# Patient Record
Sex: Female | Born: 1935
Health system: Southern US, Community
[De-identification: ages and names within clinical notes are randomized; demographics above are authoritative.]

## PROBLEM LIST (undated history)

## (undated) DIAGNOSIS — I10 Essential (primary) hypertension: Secondary | ICD-10-CM

## (undated) DIAGNOSIS — I219 Acute myocardial infarction, unspecified: Secondary | ICD-10-CM

## (undated) DIAGNOSIS — R0600 Dyspnea, unspecified: Secondary | ICD-10-CM

## (undated) DIAGNOSIS — C50919 Malignant neoplasm of unspecified site of unspecified female breast: Secondary | ICD-10-CM

## (undated) DIAGNOSIS — M1990 Unspecified osteoarthritis, unspecified site: Secondary | ICD-10-CM

## (undated) DIAGNOSIS — R9431 Abnormal electrocardiogram [ECG] [EKG]: Secondary | ICD-10-CM

## (undated) DIAGNOSIS — H9190 Unspecified hearing loss, unspecified ear: Secondary | ICD-10-CM

## (undated) DIAGNOSIS — R202 Paresthesia of skin: Secondary | ICD-10-CM

## (undated) DIAGNOSIS — F0390 Unspecified dementia without behavioral disturbance: Secondary | ICD-10-CM

## (undated) DIAGNOSIS — I872 Venous insufficiency (chronic) (peripheral): Secondary | ICD-10-CM

## (undated) DIAGNOSIS — F419 Anxiety disorder, unspecified: Secondary | ICD-10-CM

## (undated) DIAGNOSIS — S02609A Fracture of mandible, unspecified, initial encounter for closed fracture: Secondary | ICD-10-CM

## (undated) DIAGNOSIS — K449 Diaphragmatic hernia without obstruction or gangrene: Secondary | ICD-10-CM

## (undated) DIAGNOSIS — E785 Hyperlipidemia, unspecified: Secondary | ICD-10-CM

## (undated) DIAGNOSIS — R0609 Other forms of dyspnea: Secondary | ICD-10-CM

## (undated) DIAGNOSIS — K861 Other chronic pancreatitis: Secondary | ICD-10-CM

## (undated) DIAGNOSIS — C44300 Unspecified malignant neoplasm of skin of unspecified part of face: Secondary | ICD-10-CM

## (undated) DIAGNOSIS — M159 Polyosteoarthritis, unspecified: Secondary | ICD-10-CM

## (undated) DIAGNOSIS — E119 Type 2 diabetes mellitus without complications: Secondary | ICD-10-CM

## (undated) DIAGNOSIS — K219 Gastro-esophageal reflux disease without esophagitis: Secondary | ICD-10-CM

## (undated) DIAGNOSIS — Z853 Personal history of malignant neoplasm of breast: Secondary | ICD-10-CM

## (undated) DIAGNOSIS — J45909 Unspecified asthma, uncomplicated: Secondary | ICD-10-CM

## (undated) DIAGNOSIS — K589 Irritable bowel syndrome without diarrhea: Secondary | ICD-10-CM

## (undated) DIAGNOSIS — R42 Dizziness and giddiness: Secondary | ICD-10-CM

## (undated) DIAGNOSIS — N183 Chronic kidney disease, stage 3 unspecified: Secondary | ICD-10-CM

## (undated) DIAGNOSIS — K439 Ventral hernia without obstruction or gangrene: Secondary | ICD-10-CM

## (undated) DIAGNOSIS — F32A Depression, unspecified: Secondary | ICD-10-CM

## (undated) DIAGNOSIS — Z8719 Personal history of other diseases of the digestive system: Secondary | ICD-10-CM

## (undated) DIAGNOSIS — F329 Major depressive disorder, single episode, unspecified: Secondary | ICD-10-CM

## (undated) HISTORY — DX: Fracture of mandible, unspecified, initial encounter for closed fracture: S02.609A

## (undated) HISTORY — DX: Personal history of malignant neoplasm of breast: Z85.3

## (undated) HISTORY — PX: MASTECTOMY: SHX3

## (undated) HISTORY — PX: CHOLECYSTECTOMY OPEN: SUR202

## (undated) HISTORY — DX: Anxiety disorder, unspecified: F41.9

## (undated) HISTORY — DX: Dyspnea, unspecified: R06.00

## (undated) HISTORY — DX: Depression, unspecified: F32.A

## (undated) HISTORY — DX: Type 2 diabetes mellitus without complications: E11.9

## (undated) HISTORY — DX: Chronic kidney disease, stage 3 unspecified: N18.30

## (undated) HISTORY — PX: CATARACT EXTRACTION W/ INTRAOCULAR LENS  IMPLANT, BILATERAL: SHX1307

## (undated) HISTORY — DX: Unspecified dementia, unspecified severity, without behavioral disturbance, psychotic disturbance, mood disturbance, and anxiety: F03.90

## (undated) HISTORY — PX: BREAST SURGERY: SHX581

## (undated) HISTORY — DX: Venous insufficiency (chronic) (peripheral): I87.2

## (undated) HISTORY — DX: Other forms of dyspnea: R06.09

## (undated) HISTORY — DX: Diaphragmatic hernia without obstruction or gangrene: K44.9

## (undated) HISTORY — DX: Paresthesia of skin: R20.2

## (undated) HISTORY — DX: Unspecified hearing loss, unspecified ear: H91.90

## (undated) HISTORY — DX: Ventral hernia without obstruction or gangrene: K43.9

## (undated) HISTORY — DX: Abnormal electrocardiogram (ECG) (EKG): R94.31

## (undated) HISTORY — DX: Polyosteoarthritis, unspecified: M15.9

---
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
## 1999-06-10 ENCOUNTER — Encounter: Payer: Self-pay | Admitting: Emergency Medicine

## 1999-06-10 ENCOUNTER — Inpatient Hospital Stay (HOSPITAL_COMMUNITY): Admission: EM | Admit: 1999-06-10 | Discharge: 1999-06-13 | Payer: Self-pay | Admitting: Cardiology

## 2004-06-27 ENCOUNTER — Emergency Department (HOSPITAL_COMMUNITY): Admission: EM | Admit: 2004-06-27 | Discharge: 2004-06-27 | Payer: Self-pay | Admitting: Emergency Medicine

## 2014-11-24 HISTORY — PX: OTHER SURGICAL HISTORY: SHX169

## 2015-11-12 ENCOUNTER — Emergency Department (HOSPITAL_COMMUNITY): Payer: 59

## 2015-11-12 ENCOUNTER — Encounter (HOSPITAL_COMMUNITY): Payer: Self-pay | Admitting: *Deleted

## 2015-11-12 ENCOUNTER — Inpatient Hospital Stay (HOSPITAL_COMMUNITY)
Admission: EM | Admit: 2015-11-12 | Discharge: 2015-11-14 | DRG: 312 | Disposition: A | Discharge status: Nursing Home (Includes ICF) | Payer: 59 | Attending: Oncology | Admitting: Oncology

## 2015-11-12 DIAGNOSIS — E785 Hyperlipidemia, unspecified: Secondary | ICD-10-CM | POA: Diagnosis present

## 2015-11-12 DIAGNOSIS — I152 Hypertension secondary to endocrine disorders: Secondary | ICD-10-CM

## 2015-11-12 DIAGNOSIS — Z853 Personal history of malignant neoplasm of breast: Secondary | ICD-10-CM | POA: Diagnosis not present

## 2015-11-12 DIAGNOSIS — K219 Gastro-esophageal reflux disease without esophagitis: Secondary | ICD-10-CM | POA: Diagnosis present

## 2015-11-12 DIAGNOSIS — Z7982 Long term (current) use of aspirin: Secondary | ICD-10-CM | POA: Diagnosis not present

## 2015-11-12 DIAGNOSIS — K589 Irritable bowel syndrome without diarrhea: Secondary | ICD-10-CM | POA: Diagnosis present

## 2015-11-12 DIAGNOSIS — S02611A Fracture of condylar process of right mandible, initial encounter for closed fracture: Secondary | ICD-10-CM | POA: Diagnosis present

## 2015-11-12 DIAGNOSIS — D3501 Benign neoplasm of right adrenal gland: Secondary | ICD-10-CM | POA: Diagnosis present

## 2015-11-12 DIAGNOSIS — R55 Syncope and collapse: Principal | ICD-10-CM | POA: Diagnosis present

## 2015-11-12 DIAGNOSIS — Z79899 Other long term (current) drug therapy: Secondary | ICD-10-CM

## 2015-11-12 DIAGNOSIS — I10 Essential (primary) hypertension: Secondary | ICD-10-CM | POA: Diagnosis present

## 2015-11-12 DIAGNOSIS — W1830XA Fall on same level, unspecified, initial encounter: Secondary | ICD-10-CM | POA: Diagnosis present

## 2015-11-12 DIAGNOSIS — S01511A Laceration without foreign body of lip, initial encounter: Secondary | ICD-10-CM | POA: Diagnosis present

## 2015-11-12 DIAGNOSIS — K861 Other chronic pancreatitis: Secondary | ICD-10-CM | POA: Diagnosis present

## 2015-11-12 DIAGNOSIS — I252 Old myocardial infarction: Secondary | ICD-10-CM

## 2015-11-12 DIAGNOSIS — E43 Unspecified severe protein-calorie malnutrition: Secondary | ICD-10-CM | POA: Diagnosis present

## 2015-11-12 DIAGNOSIS — S02609A Fracture of mandible, unspecified, initial encounter for closed fracture: Secondary | ICD-10-CM

## 2015-11-12 DIAGNOSIS — D649 Anemia, unspecified: Secondary | ICD-10-CM | POA: Diagnosis present

## 2015-11-12 DIAGNOSIS — R011 Cardiac murmur, unspecified: Secondary | ICD-10-CM | POA: Diagnosis not present

## 2015-11-12 DIAGNOSIS — I452 Bifascicular block: Secondary | ICD-10-CM | POA: Diagnosis present

## 2015-11-12 DIAGNOSIS — I2510 Atherosclerotic heart disease of native coronary artery without angina pectoris: Secondary | ICD-10-CM | POA: Diagnosis present

## 2015-11-12 DIAGNOSIS — E1159 Type 2 diabetes mellitus with other circulatory complications: Secondary | ICD-10-CM

## 2015-11-12 DIAGNOSIS — E119 Type 2 diabetes mellitus without complications: Secondary | ICD-10-CM | POA: Diagnosis present

## 2015-11-12 DIAGNOSIS — J309 Allergic rhinitis, unspecified: Secondary | ICD-10-CM

## 2015-11-12 DIAGNOSIS — R42 Dizziness and giddiness: Secondary | ICD-10-CM

## 2015-11-12 HISTORY — DX: Anxiety disorder, unspecified: F41.9

## 2015-11-12 HISTORY — DX: Hyperlipidemia, unspecified: E78.5

## 2015-11-12 HISTORY — DX: Dizziness and giddiness: R42

## 2015-11-12 HISTORY — DX: Major depressive disorder, single episode, unspecified: F32.9

## 2015-11-12 HISTORY — DX: Unspecified osteoarthritis, unspecified site: M19.90

## 2015-11-12 HISTORY — DX: Unspecified malignant neoplasm of skin of unspecified part of face: C44.300

## 2015-11-12 HISTORY — DX: Gastro-esophageal reflux disease without esophagitis: K21.9

## 2015-11-12 HISTORY — DX: Acute myocardial infarction, unspecified: I21.9

## 2015-11-12 HISTORY — DX: Malignant neoplasm of unspecified site of unspecified female breast: C50.919

## 2015-11-12 HISTORY — DX: Essential (primary) hypertension: I10

## 2015-11-12 HISTORY — DX: Personal history of other diseases of the digestive system: Z87.19

## 2015-11-12 HISTORY — DX: Other chronic pancreatitis: K86.1

## 2015-11-12 HISTORY — DX: Irritable bowel syndrome, unspecified: K58.9

## 2015-11-12 LAB — URINALYSIS, ROUTINE W REFLEX MICROSCOPIC
Bilirubin Urine: NEGATIVE
GLUCOSE, UA: 100 mg/dL — AB
Hgb urine dipstick: NEGATIVE
Ketones, ur: 15 mg/dL — AB
LEUKOCYTES UA: NEGATIVE
NITRITE: NEGATIVE
PH: 5.5 (ref 5.0–8.0)
Protein, ur: NEGATIVE mg/dL
SPECIFIC GRAVITY, URINE: 1.036 — AB (ref 1.005–1.030)

## 2015-11-12 LAB — TROPONIN I: Troponin I: <0.03 ng/mL (ref <0.031)

## 2015-11-12 LAB — I-STAT TROPONIN, ED: Troponin i, poc: 0 ng/mL (ref 0.00–0.08)

## 2015-11-12 LAB — CBC
HEMATOCRIT: 32 % — AB (ref 36.0–46.0)
HEMOGLOBIN: 10.7 g/dL — AB (ref 12.0–15.0)
MCH: 31.2 pg (ref 26.0–34.0)
MCHC: 33.4 g/dL (ref 30.0–36.0)
MCV: 93.3 fL (ref 78.0–100.0)
Platelets: 209 10*3/uL (ref 150–400)
RBC: 3.43 MIL/uL — ABNORMAL LOW (ref 3.87–5.11)
RDW: 12.5 % (ref 11.5–15.5)
WBC: 8.1 10*3/uL (ref 4.0–10.5)

## 2015-11-12 LAB — BASIC METABOLIC PANEL
ANION GAP: 11 (ref 5–15)
BUN: 22 mg/dL — AB (ref 6–20)
CO2: 23 mmol/L (ref 22–32)
Calcium: 9.1 mg/dL (ref 8.9–10.3)
Chloride: 104 mmol/L (ref 101–111)
Creatinine, Ser: 1.13 mg/dL — ABNORMAL HIGH (ref 0.44–1.00)
GFR calc Af Amer: 50 mL/min — ABNORMAL LOW (ref >60)
GFR, EST NON AFRICAN AMERICAN: 43 mL/min — AB (ref >60)
GLUCOSE: 195 mg/dL — AB (ref 65–99)
POTASSIUM: 4.9 mmol/L (ref 3.5–5.1)
SODIUM: 138 mmol/L (ref 135–145)

## 2015-11-12 LAB — D-DIMER, QUANTITATIVE (NOT AT ARMC): D DIMER QUANT: 1.55 ug{FEU}/mL — AB (ref 0.00–0.50)

## 2015-11-12 LAB — GLUCOSE, CAPILLARY: Glucose-Capillary: 116 mg/dL — ABNORMAL HIGH (ref 65–99)

## 2015-11-12 LAB — TSH: TSH: 1.097 u[IU]/mL (ref 0.350–4.500)

## 2015-11-12 MED ORDER — METOPROLOL TARTRATE 25 MG PO TABS
25.0000 mg | ORAL_TABLET | Freq: Two times a day (BID) | ORAL | Status: DC
  Administered 2015-11-12 – 2015-11-14 (×4): 25 mg via ORAL
  Filled 2015-11-12 (×4): qty 1

## 2015-11-12 MED ORDER — MONTELUKAST SODIUM 10 MG PO TABS: 10.0000 mg | ORAL_TABLET | Freq: Every day | ORAL | Status: DC

## 2015-11-12 MED ORDER — MORPHINE SULFATE (PF) 2 MG/ML IV SOLN
2.0000 mg | Freq: Once | INTRAVENOUS | Status: AC
  Administered 2015-11-12: 2 mg via INTRAVENOUS
  Filled 2015-11-12: qty 1

## 2015-11-12 MED ORDER — PANTOPRAZOLE SODIUM 40 MG PO TBEC
40.0000 mg | DELAYED_RELEASE_TABLET | Freq: Every day | ORAL | Status: DC
Start: 2015-11-13 — End: 2015-11-12

## 2015-11-12 MED ORDER — SODIUM CHLORIDE 0.9 % IJ SOLN
3.0000 mL | Freq: Two times a day (BID) | INTRAMUSCULAR | Status: DC
  Administered 2015-11-12: 3 mL via INTRAVENOUS

## 2015-11-12 MED ORDER — DICYCLOMINE HCL 10 MG PO CAPS: 10.0000 mg | ORAL_CAPSULE | Freq: Two times a day (BID) | ORAL | Status: DC | PRN

## 2015-11-12 MED ORDER — PRAVASTATIN SODIUM 20 MG PO TABS
20.0000 mg | ORAL_TABLET | Freq: Every day | ORAL | Status: DC
  Administered 2015-11-12 – 2015-11-13 (×2): 20 mg via ORAL
  Filled 2015-11-12 (×2): qty 1

## 2015-11-12 MED ORDER — ONDANSETRON HCL 4 MG/2ML IJ SOLN: 4.0000 mg | Freq: Four times a day (QID) | INTRAMUSCULAR | Status: DC | PRN

## 2015-11-12 MED ORDER — ONDANSETRON HCL 4 MG PO TABS: 4.0000 mg | ORAL_TABLET | Freq: Four times a day (QID) | ORAL | Status: DC | PRN

## 2015-11-12 MED ORDER — ENSURE ENLIVE PO LIQD: 237.0000 mL | Freq: Two times a day (BID) | ORAL | Status: DC

## 2015-11-12 MED ORDER — POLYETHYLENE GLYCOL 3350 17 G PO PACK: 17.0000 g | PACK | Freq: Every day | ORAL | Status: DC | PRN

## 2015-11-12 MED ORDER — PANTOPRAZOLE SODIUM 40 MG PO TBEC
40.0000 mg | DELAYED_RELEASE_TABLET | Freq: Every day | ORAL | Status: DC
  Administered 2015-11-13 – 2015-11-14 (×2): 40 mg via ORAL
  Filled 2015-11-12 (×2): qty 1

## 2015-11-12 MED ORDER — PANCRELIPASE (LIP-PROT-AMYL) 12000-38000 UNITS PO CPEP
12000.0000 [IU] | ORAL_CAPSULE | Freq: Three times a day (TID) | ORAL | Status: DC
  Administered 2015-11-13 – 2015-11-14 (×6): 12000 [IU] via ORAL
  Filled 2015-11-12 (×6): qty 1

## 2015-11-12 MED ORDER — LIDOCAINE HCL (PF) 2 % IJ SOLN
10.0000 mL | Freq: Once | INTRAMUSCULAR | Status: AC
  Administered 2015-11-12: 10 mL
  Filled 2015-11-12: qty 10

## 2015-11-12 MED ORDER — INSULIN ASPART 100 UNIT/ML ~~LOC~~ SOLN
0.0000 [IU] | Freq: Three times a day (TID) | SUBCUTANEOUS | Status: DC
  Administered 2015-11-13: 1 [IU] via SUBCUTANEOUS
  Administered 2015-11-13: 2 [IU] via SUBCUTANEOUS
  Administered 2015-11-13: 1 [IU] via SUBCUTANEOUS
  Administered 2015-11-14: 2 [IU] via SUBCUTANEOUS
  Administered 2015-11-14: 1 [IU] via SUBCUTANEOUS
  Administered 2015-11-14: 3 [IU] via SUBCUTANEOUS

## 2015-11-12 MED ORDER — ASPIRIN EC 81 MG PO TBEC
81.0000 mg | DELAYED_RELEASE_TABLET | Freq: Every day | ORAL | Status: DC
  Administered 2015-11-13 – 2015-11-14 (×2): 81 mg via ORAL
  Filled 2015-11-12 (×2): qty 1

## 2015-11-12 MED ORDER — IOHEXOL 350 MG/ML SOLN
75.0000 mL | Freq: Once | INTRAVENOUS | Status: AC | PRN
  Administered 2015-11-12: 75 mL via INTRAVENOUS

## 2015-11-12 MED ORDER — ONDANSETRON HCL 4 MG/2ML IJ SOLN
4.0000 mg | Freq: Once | INTRAMUSCULAR | Status: AC
  Administered 2015-11-12: 4 mg via INTRAVENOUS
  Filled 2015-11-12: qty 2

## 2015-11-12 MED ORDER — LISINOPRIL 5 MG PO TABS
5.0000 mg | ORAL_TABLET | Freq: Every day | ORAL | Status: DC
  Administered 2015-11-13 – 2015-11-14 (×2): 5 mg via ORAL
  Filled 2015-11-12 (×2): qty 1

## 2015-11-12 MED ORDER — NITROGLYCERIN 0.4 MG SL SUBL: 0.4000 mg | SUBLINGUAL_TABLET | SUBLINGUAL | Status: DC | PRN

## 2015-11-12 MED ORDER — MECLIZINE HCL 12.5 MG PO TABS: 12.5000 mg | ORAL_TABLET | Freq: Two times a day (BID) | ORAL | Status: DC | PRN

## 2015-11-12 MED ORDER — PANCRELIPASE (LIP-PROT-AMYL) 12000-38000 UNITS PO CPEP: 12000.0000 [IU] | ORAL_CAPSULE | Freq: Three times a day (TID) | ORAL | Status: DC

## 2015-11-12 MED ORDER — MORPHINE SULFATE (PF) 2 MG/ML IV SOLN: 2.0000 mg | INTRAVENOUS | Status: DC | PRN

## 2015-11-12 MED ORDER — ENOXAPARIN SODIUM 30 MG/0.3ML ~~LOC~~ SOLN
30.0000 mg | SUBCUTANEOUS | Status: DC
  Administered 2015-11-12 – 2015-11-14 (×3): 30 mg via SUBCUTANEOUS
  Filled 2015-11-12 (×3): qty 0.3

## 2015-11-12 MED ORDER — INSULIN ASPART 100 UNIT/ML ~~LOC~~ SOLN: 0.0000 [IU] | Freq: Every day | SUBCUTANEOUS | Status: DC

## 2015-11-12 MED ORDER — ENOXAPARIN SODIUM 40 MG/0.4ML ~~LOC~~ SOLN: 40.0000 mg | SUBCUTANEOUS | Status: DC

## 2015-11-12 MED ORDER — SODIUM CHLORIDE 0.9 % IV SOLN
INTRAVENOUS | Status: DC
  Administered 2015-11-12 – 2015-11-13 (×2): via INTRAVENOUS

## 2015-11-12 MED ORDER — FLUTICASONE PROPIONATE 50 MCG/ACT NA SUSP
1.0000 | Freq: Every day | NASAL | Status: DC
  Administered 2015-11-13: 1 via NASAL
  Filled 2015-11-12: qty 16

## 2015-11-12 MED ORDER — ISOSORBIDE MONONITRATE ER 30 MG PO TB24
45.0000 mg | ORAL_TABLET | Freq: Every day | ORAL | Status: DC
  Administered 2015-11-13 – 2015-11-14 (×2): 45 mg via ORAL
  Filled 2015-11-12 (×2): qty 2

## 2015-11-12 MED ORDER — MORPHINE SULFATE (PF) 4 MG/ML IV SOLN
4.0000 mg | Freq: Once | INTRAVENOUS | Status: AC
  Administered 2015-11-12: 4 mg via INTRAVENOUS
  Filled 2015-11-12: qty 1

## 2015-11-12 NOTE — H&P (Signed)
Date: 11/12/2015               Patient Name:  Vanessa Gibson MRN: [id number]  DOB: 03/25/1928 Age / Sex: 81 y.o., female   PCP: No primary care provider on file.         Medical Service: Internal Medicine Teaching Service         Attending Physician: Dr. Annia Belt, MD    First Contact: Dr. Lovena Le Pager: [phone]  Second Contact: Dr. Marvel Plan Pager: [phone]       After Hours (After 5p/  First Contact Pager: [phone]  weekends / holidays): Second Contact Pager: [phone]   Chief Complaint: syncope, jaw fracture  History of Present Illness: [name] is an 81 yo female with HTN, HLD, DMII, chronic pancreatitis, h/o MI, and h/o breast cancer, presenting with syncope and jaw fracture.  Patient reports standing and eating a bowl of soup while waiting for her to daughter to arrive to her ALF.  The patient then began feeling dizzy, lightheaded, and felt as if the room were spinning.  She then lost consciousness and hit her head on a lamp and the floor.  She has a h/o vertigo for which takes Meclizine several times per day.  She has daily episodes of vertigo.  However, she states the dizziness preceding her LOC was different from her normal episode, in that it did not resolve. She has been having intermittent right ear pain, much worse this morning prior to the syncopal episode.  She has never had a syncopal episode before.  She has had poor PO intake recently.  She had 3-4 days of N/V, diarrhea, decreased appetite, .  She thinks this is partly due to her chronic pancreatitis as well as stress from her recent move from [place] to [place].   Family is also concerned that prior to and since the move, it is unclear what medications the patient is supposed to be taking and that she may be confusing them.  She endorses leg weakness preceding the episode, but states it is a chronic issue, unchanged from previous.  She is supposed to ambulate with a walker but was not using one.  She denies  tongue biting or loss of bowel/bladder control. She denies a h/o seizure. She flew from [place]. [place] on Thursday (12/15).  She endorses SOB surrounding the syncopal episode, but states was because she "not feeling good."  D-dimer in the ED was elevated to 1.55.  CTA was negative for PE.    She denies fever, chills, dysuria, continued N/V or diarrhea.    She has a history of breast cancer of unknown subtype, diagnosed 30-40 years ago, s/p double mastectomy.  She had an MI in 1999.   She has a family history of dementia in her father, and HLD and HTN in the rest of the family.  In the ED, CT demonstrated comminuted fracture of right mandibular condyles and age-related cerebral atrophy without stroke or mass.   Meds: No current facility-administered medications for this encounter.   Current Outpatient Prescriptions  Medication Sig Dispense Refill  . ALPRAZolam (XANAX) 0.25 MG tablet Take 0.25 mg by mouth at bedtime as needed for anxiety.    Marland Kitchen aspirin EC 81 MG tablet Take 81 mg by mouth daily.    . cycloSPORINE (RESTASIS) 0.05 % ophthalmic emulsion Place 1 drop into both eyes 2 (two) times daily.    Marland Kitchen dicyclomine (BENTYL) 10 MG capsule Take 10 mg by mouth 2 (two) times  daily as needed for spasms.    Marland Kitchen donepezil (ARICEPT) 10 MG tablet Take 10 mg by mouth 2 (two) times daily.    . DULoxetine (CYMBALTA) 30 MG capsule Take 30 mg by mouth daily.    . Eluxadoline (VIBERZI) 75 MG TABS Take 75 mg by mouth 2 (two) times daily.    Marland Kitchen erythromycin ophthalmic ointment Place 1 application into both eyes at bedtime.    Marland Kitchen esomeprazole (NEXIUM) 40 MG capsule Take 40 mg by mouth daily at 12 noon.    . fexofenadine (ALLEGRA) 180 MG tablet Take 180 mg by mouth daily.    . fluticasone (FLONASE) 50 MCG/ACT nasal spray Place 1 spray into both nostrils daily.    Marland Kitchen glipiZIDE-metformin (METAGLIP) 5-500 MG tablet Take 2 tablets by mouth 2 (two) times daily before a meal.    . ipratropium (ATROVENT) 0.03 % nasal spray  Place 1-2 sprays into both nostrils 4 (four) times daily as needed for rhinitis.    Marland Kitchen isosorbide mononitrate (IMDUR) 30 MG 24 hr tablet Take 45 mg by mouth daily.    . lipase/protease/amylase (CREON) 12000 UNITS CPEP capsule Take 12,000 Units by mouth 3 (three) times daily with meals.    Marland Kitchen lisinopril (PRINIVIL,ZESTRIL) 5 MG tablet Take 5 mg by mouth daily.    Marland Kitchen lovastatin (MEVACOR) 20 MG tablet Take 20 mg by mouth at bedtime.    . meclizine (ANTIVERT) 12.5 MG tablet Take 12.5 mg by mouth 2 (two) times daily as needed for dizziness.    . metoprolol tartrate (LOPRESSOR) 25 MG tablet Take 25 mg by mouth 2 (two) times daily.    . montelukast (SINGULAIR) 10 MG tablet Take 10 mg by mouth at bedtime.    . nitroGLYCERIN (NITROSTAT) 0.3 MG SL tablet Place 0.3 mg under the tongue every 5 (five) minutes as needed for chest pain.    Marland Kitchen omeprazole (PRILOSEC) 40 MG capsule Take 40 mg by mouth daily.    . ondansetron (ZOFRAN) 4 MG tablet Take 4 mg by mouth every 8 (eight) hours as needed for nausea or vomiting.    Marland Kitchen oxybutynin (DITROPAN) 5 MG tablet Take 5 mg by mouth daily.    Marland Kitchen tolterodine (DETROL LA) 2 MG 24 hr capsule Take 2 mg by mouth daily.    . clorazepate (TRANXENE) 3.75 MG tablet Take 3.75 mg by mouth 2 (two) times daily as needed for anxiety.      Allergies: Allergies as of 11/12/2015  . (No Known Allergies)   Past Medical History  Diagnosis Date  . Hypertension   . Diabetes mellitus without complication ([place])   . Chronic pancreatitis ([place])   . IBS (irritable bowel syndrome)   . Dizziness   . Giddiness   . Hyperlipemia breast cancer  . Cancer ([place])     Breast  . MI (myocardial infarction) [facility])    Past Surgical History  Procedure Laterality Date  . Breast surgery    . Cholecystectomy    . Mastectomy      Bilateral   No family history on file. Social History   Social History  . Marital Status: Married    Spouse Name: N/A  . Number of Children: N/A  . Years of Education: N/A     Occupational History  . Not on file.   Social History Main Topics  . Smoking status: Never Smoker   . Smokeless tobacco: Never Used  . Alcohol Use: No  . Drug Use: No  . Sexual Activity: No  Other Topics Concern  . Not on file   Social History Narrative  . No narrative on file    Review of Systems: Pertinent items noted in HPI and remainder of comprehensive ROS otherwise negative.  Physical Exam: Blood pressure 123/50, pulse 83, temperature 99 F (37.2 C), temperature source Oral, resp. rate 17, height 5' (1.524 m), weight 103 lb (46.72 kg), SpO2 98 %. Physical Exam  Constitutional: She is oriented to person, place, and time. No distress.  Frail, elderly female, lying in bed.  Responsive to questions.  Speech normal despite inability to fully open jaw without pain.  HENT:  Head: Normocephalic and atraumatic.  Right Ear: External ear normal.  Left Ear: External ear normal.  Mouth/Throat: Oropharynx is clear and moist.  Eyes: EOM are normal. Pupils are equal, round, and reactive to light. No scleral icterus.  Neck: Neck supple. No tracheal deviation present.  TTP of right lateral neck and C-spine.  Cardiovascular: Normal rate, regular rhythm and intact distal pulses.   Grade II/VI systolic murmur heard best at RUSB.  Pulmonary/Chest: Effort normal and breath sounds normal. No respiratory distress. She has no wheezes.  No crackles.  Abdominal: Soft. She exhibits no distension. There is no tenderness. There is no rebound and no guarding.  Musculoskeletal: She exhibits no edema.  Neurological: She is alert and oriented to person, place, and time.  5/5 strength in bilateral UE and LE.  Patellar and biceps reflexes 2+ and symmetric.  Cranial nerves intact to bedside testing.  Skin: Skin is warm and dry. No rash noted. She is not diaphoretic.     Lab results: Basic Metabolic Panel:  Recent Labs  11/12/15 1305  NA 138  K 4.9  CL 104  CO2 23  GLUCOSE 195*  BUN 22*   CREATININE 1.13*  CALCIUM 9.1   Liver Function Tests: No results for input(s): AST, ALT, ALKPHOS, BILITOT, PROT, ALBUMIN in the last 72 hours. No results for input(s): LIPASE, AMYLASE in the last 72 hours. No results for input(s): AMMONIA in the last 72 hours. CBC:  Recent Labs  11/12/15 1305  WBC 8.1  HGB 10.7*  HCT 32.0*  MCV 93.3  PLT 209   Cardiac Enzymes: No results for input(s): CKTOTAL, CKMB, CKMBINDEX, TROPONINI in the last 72 hours. BNP: No results for input(s): PROBNP in the last 72 hours. D-Dimer:  Recent Labs  11/12/15 1324  DDIMER 1.55*   CBG: No results for input(s): GLUCAP in the last 72 hours. Hemoglobin A1C: No results for input(s): HGBA1C in the last 72 hours. Fasting Lipid Panel: No results for input(s): CHOL, HDL, LDLCALC, TRIG, CHOLHDL, LDLDIRECT in the last 72 hours. Thyroid Function Tests: No results for input(s): TSH, T4TOTAL, FREET4, T3FREE, THYROIDAB in the last 72 hours. Anemia Panel: No results for input(s): VITAMINB12, FOLATE, FERRITIN, TIBC, IRON, RETICCTPCT in the last 72 hours. Coagulation: No results for input(s): LABPROT, INR in the last 72 hours. Urine Drug Screen: Drugs of Abuse  No results found for: LABOPIA, COCAINSCRNUR, LABBENZ, AMPHETMU, THCU, LABBARB  Alcohol Level: No results for input(s): ETH in the last 72 hours. Urinalysis:  Recent Labs  11/12/15 1603  COLORURINE YELLOW  LABSPEC 1.036*  PHURINE 5.5  GLUCOSEU 100*  HGBUR NEGATIVE  BILIRUBINUR NEGATIVE  KETONESUR 15*  PROTEINUR NEGATIVE  NITRITE NEGATIVE  LEUKOCYTESUR NEGATIVE   Misc. Labs:   Imaging results:  Ct Head Wo Contrast  11/12/2015  CLINICAL DATA:  [place] several hours ago today, dizzy, fell face down and broke  upper teeth with lip laceration EXAM: CT HEAD WITHOUT CONTRAST CT MAXILLOFACIAL WITHOUT CONTRAST CT CERVICAL SPINE WITHOUT CONTRAST TECHNIQUE: Multidetector CT imaging of the head, cervical spine, and maxillofacial structures were  performed using the standard protocol without intravenous contrast. Multiplanar CT image reconstructions of the cervical spine and maxillofacial structures were also generated. COMPARISON:  None. FINDINGS: CT HEAD FINDINGS Age-related atrophy and low attenuation in the deep white matter. No evidence of hemorrhage or extra-axial fluid. No mass, infarct, or hydrocephalus. No skull fracture. CT MAXILLOFACIAL FINDINGS There is a comminuted fracture involving the right mandibular condyles with significant displacement of 3 primary fracture fragments. The largest fragment is displaced medially and inferiorly by about 1 cm. No other facial bone fractures are identified. Dental evaluation extremely limited due to beam attenuation artifact from implants. CT CERVICAL SPINE FINDINGS Normal alignment. No paraspinous soft tissue swelling. No fracture. Multi level degenerative disk disease. Lung apices clear. IMPRESSION: Comminuted fracture right mandibular condyle. Otherwise no acute abnormalities. Limited dental evaluation as described above. Electronically Signed   By: Skipper Cliche M.D.   On: 11/12/2015 15:54   Ct Angio Chest Pe W/cm &/or Wo Cm  11/12/2015  CLINICAL DATA:  Syncope, positive D-dimer EXAM: CT ANGIOGRAPHY CHEST WITH CONTRAST TECHNIQUE: Multidetector CT imaging of the chest was performed using the standard protocol during bolus administration of intravenous contrast. Multiplanar CT image reconstructions and MIPs were obtained to evaluate the vascular anatomy. CONTRAST:  81mL OMNIPAQUE IOHEXOL 350 MG/ML SOLN COMPARISON:  None. FINDINGS: Sagittal images of the spine shows degenerative changes thoracolumbar spine. Images of the thoracic inlet are unremarkable. Central airways are patent. Atherosclerotic calcifications of thoracic aorta. No pulmonary embolus is noted. Images of the lung parenchyma shows no acute infiltrate or pulmonary edema. Mild dependent atelectasis noted lung bases posteriorly.  Atherosclerotic calcifications of coronary arteries. No mediastinal hematoma or adenopathy. No hilar adenopathy. The visualized upper abdomen shows intrahepatic and extrahepatic biliary ductal dilatation. There is CBD dilatation up to 1.5 cm. The patient is status postcholecystectomy. There is a low-density nodule probable adenoma right adrenal gland measures 1 cm. Review of the MIP images confirms the above findings. IMPRESSION: 1. No pulmonary embolus is noted. 2. No acute infiltrate or pulmonary edema. 3. Mild atherosclerotic calcifications of thoracic aorta. Atherosclerotic calcifications of coronary arteries. 4. There is intrahepatic biliary ductal dilatation. Significant CBD dilatation up to 1.5 cm. Status postcholecystectomy. 5. Probable right adrenal adenoma measures 1 cm. Electronically Signed   By: Lahoma Crocker M.D.   On: 11/12/2015 15:41   Ct Cervical Spine Wo Contrast  11/12/2015  CLINICAL DATA:  [place] several hours ago today, dizzy, fell face down and broke upper teeth with lip laceration EXAM: CT HEAD WITHOUT CONTRAST CT MAXILLOFACIAL WITHOUT CONTRAST CT CERVICAL SPINE WITHOUT CONTRAST TECHNIQUE: Multidetector CT imaging of the head, cervical spine, and maxillofacial structures were performed using the standard protocol without intravenous contrast. Multiplanar CT image reconstructions of the cervical spine and maxillofacial structures were also generated. COMPARISON:  None. FINDINGS: CT HEAD FINDINGS Age-related atrophy and low attenuation in the deep white matter. No evidence of hemorrhage or extra-axial fluid. No mass, infarct, or hydrocephalus. No skull fracture. CT MAXILLOFACIAL FINDINGS There is a comminuted fracture involving the right mandibular condyles with significant displacement of 3 primary fracture fragments. The largest fragment is displaced medially and inferiorly by about 1 cm. No other facial bone fractures are identified. Dental evaluation extremely limited due to beam attenuation  artifact from implants. CT CERVICAL SPINE FINDINGS Normal alignment.  No paraspinous soft tissue swelling. No fracture. Multi level degenerative disk disease. Lung apices clear. IMPRESSION: Comminuted fracture right mandibular condyle. Otherwise no acute abnormalities. Limited dental evaluation as described above. Electronically Signed   By: Skipper Cliche M.D.   On: 11/12/2015 15:54   Ct Maxillofacial Wo Cm  11/12/2015  CLINICAL DATA:  [place] several hours ago today, dizzy, fell face down and broke upper teeth with lip laceration EXAM: CT HEAD WITHOUT CONTRAST CT MAXILLOFACIAL WITHOUT CONTRAST CT CERVICAL SPINE WITHOUT CONTRAST TECHNIQUE: Multidetector CT imaging of the head, cervical spine, and maxillofacial structures were performed using the standard protocol without intravenous contrast. Multiplanar CT image reconstructions of the cervical spine and maxillofacial structures were also generated. COMPARISON:  None. FINDINGS: CT HEAD FINDINGS Age-related atrophy and low attenuation in the deep white matter. No evidence of hemorrhage or extra-axial fluid. No mass, infarct, or hydrocephalus. No skull fracture. CT MAXILLOFACIAL FINDINGS There is a comminuted fracture involving the right mandibular condyles with significant displacement of 3 primary fracture fragments. The largest fragment is displaced medially and inferiorly by about 1 cm. No other facial bone fractures are identified. Dental evaluation extremely limited due to beam attenuation artifact from implants. CT CERVICAL SPINE FINDINGS Normal alignment. No paraspinous soft tissue swelling. No fracture. Multi level degenerative disk disease. Lung apices clear. IMPRESSION: Comminuted fracture right mandibular condyle. Otherwise no acute abnormalities. Limited dental evaluation as described above. Electronically Signed   By: Skipper Cliche M.D.   On: 11/12/2015 15:54    Other results: EKG: there are no previous tracings available for comparison, normal  sinus rhythm.  Assessment & Plan by Problem: Active Problems:   Syncope  Ms. Jaimes is an 81 yo female with HTN, HLD, DMII, chronic pancreatitis, h/o MI, and h/o breast cancer, presenting with syncope and jaw fracture.   Syncope: Patient presents with syncope while standing.  She has a h/o vertigo with similar symptoms preceding the syncopal episode.  She denies a history, signs, or symptoms of stroke, seizure, or arrhythmia.  CTA negative for PE. Symptoms likely due to combination of factors, including prolonged standing, orthostasis from previous N/V/D and decreased PO intake, and polypharmacy.  She is on multiple medications that can cause syncope in the elderly, including Xanax, Clorazepate, Donepezil, Duloxetine, and Oxybutynin.  Given her systolic ejection murmur, AS may play a role as well.  Will r/o ACS, get Echo, and perform medication reconciliation.  Her prior PCP is Reyne Dumas [phone]).  Medication list has been faxed from PCPs office. [ ]  Trend troponins [ ]  TTE [ ]  Orthostatics - Telemetry - Meclizine 12.5 mg BID PRN - Zofran 4 mg q6h PRN - HOLD Xanax, Clorazepate, Donepezil, Duloxetine, and Oxybutynin - PT/OT  Mandibular Fracture: Patient has a 3 fragment mandibular fracture.  ENT consulted in the ED.  Will provide pain control and await ENT recommendations. - Morphine 2mg  IV q4h PRN - ENT consult  HTN, HLD, and H/o MI: Last MI ~20 years ago. No records of CAD or CHF.  Will r/o ACS and get Echo as part of syncope evaluation. - Troponins and TTE - ASA - Metoprolol tartrate 25 mg BID - Lisinopril 5 mg - Imdur 45 mg  - Pravastatin 20 mg - Nitro PRN  DMII: Unknown A1c. Patient on Glipizide-Metformin at home. - HOLD Glipizide-Metformin - SSI [ ]  A1c  Chronic Pancreatitis: - Creon - HOLD Bentyl, Eluxadoline, and Tolterodine  Allergies: Flonase and Singulair  GERD: Protonix  FEN/GI: - NPO pending surgical  evaluation, then Soft  DVT Ppx:  Lovenox  Dispo: Disposition is deferred at this time, awaiting improvement of current medical problems. Anticipated discharge in approximately 2-3 day(s).   The patient does not have a current PCP (No primary care provider on file.) and does need an [facility] hospital follow-up appointment after discharge.  The patient does not have transportation limitations that hinder transportation to clinic appointments.  Signed: Iline Oven, MD, PhD 11/12/2015, 5:13 PM

## 2015-11-12 NOTE — ED Notes (Signed)
Patient transported to CT 

## 2015-11-12 NOTE — ED Provider Notes (Signed)
CSN: [id number]     Arrival date & time 11/12/15  1242 History   First MD Initiated Contact with Patient 11/12/15 1257     Chief Complaint  Patient presents with  . Loss of Consciousness     (Consider location/radiation/quality/duration/timing/severity/associated sxs/prior Treatment) HPI  81 year old female presents after syncope. She was in her independent living facility and had very brief dizziness and then passed out. She hit her head and face on the lamp and/or floor. Patient was waiting on her daughter arrive while eating soup while standing. States she medially felt dizzy while having been standing for a while and then passed out. Denies having chest pain. She does feel short of breath. This is improving. Denies palpitations. She has a laceration to the top of her lip and has right-sided jaw pain.  Past Medical History  Diagnosis Date  . Hypertension   . Diabetes mellitus without complication ([place])   . Chronic pancreatitis ([place])   . IBS (irritable bowel syndrome)   . Dizziness   . Giddiness   . Hyperlipemia breast cancer  . Cancer ([place])     Breast  . MI (myocardial infarction) [facility])    Past Surgical History  Procedure Laterality Date  . Breast surgery    . Cholecystectomy    . Mastectomy      Bilateral   No family history on file. Social History  Substance Use Topics  . Smoking status: Never Smoker   . Smokeless tobacco: Never Used  . Alcohol Use: No   OB History    No data available     Review of Systems  Respiratory: Positive for shortness of breath.   Cardiovascular: Negative for chest pain.  Gastrointestinal: Negative for vomiting.  Skin: Positive for wound.  Neurological: Positive for dizziness and syncope. Negative for weakness and headaches.  All other systems reviewed and are negative.     Allergies  Review of patient's allergies indicates no known allergies.  Home Medications   Prior to Admission medications   Not on File   BP 136/68  mmHg  Pulse 69  Temp(Src) 99 F (37.2 C) (Oral)  Resp 17  Ht 5' (1.524 m)  Wt 103 lb (46.72 kg)  BMI 20.12 kg/m2  SpO2 100% Physical Exam  Constitutional: She is oriented to person, place, and time. She appears well-developed and well-nourished.  HENT:  Head: Normocephalic.    Right Ear: External ear normal.  Left Ear: External ear normal.  Nose: Nose normal.  Mouth/Throat:    Her teeth/implants do not feel loose and are not tender. Line up normally per her.  Eyes: Right eye exhibits no discharge. Left eye exhibits no discharge.  Cardiovascular: Normal rate, regular rhythm and normal heart sounds.   Pulmonary/Chest: Effort normal and breath sounds normal.  Abdominal: Soft. There is no tenderness.  Neurological: She is alert and oriented to person, place, and time.  Skin: Skin is warm and dry.  Nursing note and vitals reviewed.   ED Course  Procedures (including critical care time) Labs Review Labs Reviewed  CBC - Abnormal; Notable for the following:    RBC 3.43 (*)    Hemoglobin 10.7 (*)    HCT 32.0 (*)    All other components within normal limits  BASIC METABOLIC PANEL  URINALYSIS, ROUTINE W REFLEX MICROSCOPIC (NOT AT [facility])  D-DIMER, QUANTITATIVE (NOT AT [facility])  I-STAT TROPOININ, ED  CBG MONITORING, ED    Imaging Review Ct Head Wo Contrast  11/12/2015  CLINICAL DATA:  Fell several hours ago today, dizzy, fell face down and broke upper teeth with lip laceration EXAM: CT HEAD WITHOUT CONTRAST CT MAXILLOFACIAL WITHOUT CONTRAST CT CERVICAL SPINE WITHOUT CONTRAST TECHNIQUE: Multidetector CT imaging of the head, cervical spine, and maxillofacial structures were performed using the standard protocol without intravenous contrast. Multiplanar CT image reconstructions of the cervical spine and maxillofacial structures were also generated. COMPARISON:  None. FINDINGS: CT HEAD FINDINGS Age-related atrophy and low attenuation in the deep white matter. No evidence of hemorrhage or  extra-axial fluid. No mass, infarct, or hydrocephalus. No skull fracture. CT MAXILLOFACIAL FINDINGS There is a comminuted fracture involving the right mandibular condyles with significant displacement of 3 primary fracture fragments. The largest fragment is displaced medially and inferiorly by about 1 cm. No other facial bone fractures are identified. Dental evaluation extremely limited due to beam attenuation artifact from implants. CT CERVICAL SPINE FINDINGS Normal alignment. No paraspinous soft tissue swelling. No fracture. Multi level degenerative disk disease. Lung apices clear. IMPRESSION: Comminuted fracture right mandibular condyle. Otherwise no acute abnormalities. Limited dental evaluation as described above. Electronically Signed   By: Skipper Cliche M.D.   On: 11/12/2015 15:54   Ct Angio Chest Pe W/cm &/or Wo Cm  11/12/2015  CLINICAL DATA:  Syncope, positive D-dimer EXAM: CT ANGIOGRAPHY CHEST WITH CONTRAST TECHNIQUE: Multidetector CT imaging of the chest was performed using the standard protocol during bolus administration of intravenous contrast. Multiplanar CT image reconstructions and MIPs were obtained to evaluate the vascular anatomy. CONTRAST:  51mL OMNIPAQUE IOHEXOL 350 MG/ML SOLN COMPARISON:  None. FINDINGS: Sagittal images of the spine shows degenerative changes thoracolumbar spine. Images of the thoracic inlet are unremarkable. Central airways are patent. Atherosclerotic calcifications of thoracic aorta. No pulmonary embolus is noted. Images of the lung parenchyma shows no acute infiltrate or pulmonary edema. Mild dependent atelectasis noted lung bases posteriorly. Atherosclerotic calcifications of coronary arteries. No mediastinal hematoma or adenopathy. No hilar adenopathy. The visualized upper abdomen shows intrahepatic and extrahepatic biliary ductal dilatation. There is CBD dilatation up to 1.5 cm. The patient is status postcholecystectomy. There is a low-density nodule probable  adenoma right adrenal gland measures 1 cm. Review of the MIP images confirms the above findings. IMPRESSION: 1. No pulmonary embolus is noted. 2. No acute infiltrate or pulmonary edema. 3. Mild atherosclerotic calcifications of thoracic aorta. Atherosclerotic calcifications of coronary arteries. 4. There is intrahepatic biliary ductal dilatation. Significant CBD dilatation up to 1.5 cm. Status postcholecystectomy. 5. Probable right adrenal adenoma measures 1 cm. Electronically Signed   By: Lahoma Crocker M.D.   On: 11/12/2015 15:41   Ct Cervical Spine Wo Contrast  11/12/2015  CLINICAL DATA:  [place] several hours ago today, dizzy, fell face down and broke upper teeth with lip laceration EXAM: CT HEAD WITHOUT CONTRAST CT MAXILLOFACIAL WITHOUT CONTRAST CT CERVICAL SPINE WITHOUT CONTRAST TECHNIQUE: Multidetector CT imaging of the head, cervical spine, and maxillofacial structures were performed using the standard protocol without intravenous contrast. Multiplanar CT image reconstructions of the cervical spine and maxillofacial structures were also generated. COMPARISON:  None. FINDINGS: CT HEAD FINDINGS Age-related atrophy and low attenuation in the deep white matter. No evidence of hemorrhage or extra-axial fluid. No mass, infarct, or hydrocephalus. No skull fracture. CT MAXILLOFACIAL FINDINGS There is a comminuted fracture involving the right mandibular condyles with significant displacement of 3 primary fracture fragments. The largest fragment is displaced medially and inferiorly by about 1 cm. No other facial bone fractures are identified. Dental evaluation extremely limited due to beam  attenuation artifact from implants. CT CERVICAL SPINE FINDINGS Normal alignment. No paraspinous soft tissue swelling. No fracture. Multi level degenerative disk disease. Lung apices clear. IMPRESSION: Comminuted fracture right mandibular condyle. Otherwise no acute abnormalities. Limited dental evaluation as described above.  Electronically Signed   By: Skipper Cliche M.D.   On: 11/12/2015 15:54   Ct Maxillofacial Wo Cm  11/12/2015  CLINICAL DATA:  [place] several hours ago today, dizzy, fell face down and broke upper teeth with lip laceration EXAM: CT HEAD WITHOUT CONTRAST CT MAXILLOFACIAL WITHOUT CONTRAST CT CERVICAL SPINE WITHOUT CONTRAST TECHNIQUE: Multidetector CT imaging of the head, cervical spine, and maxillofacial structures were performed using the standard protocol without intravenous contrast. Multiplanar CT image reconstructions of the cervical spine and maxillofacial structures were also generated. COMPARISON:  None. FINDINGS: CT HEAD FINDINGS Age-related atrophy and low attenuation in the deep white matter. No evidence of hemorrhage or extra-axial fluid. No mass, infarct, or hydrocephalus. No skull fracture. CT MAXILLOFACIAL FINDINGS There is a comminuted fracture involving the right mandibular condyles with significant displacement of 3 primary fracture fragments. The largest fragment is displaced medially and inferiorly by about 1 cm. No other facial bone fractures are identified. Dental evaluation extremely limited due to beam attenuation artifact from implants. CT CERVICAL SPINE FINDINGS Normal alignment. No paraspinous soft tissue swelling. No fracture. Multi level degenerative disk disease. Lung apices clear. IMPRESSION: Comminuted fracture right mandibular condyle. Otherwise no acute abnormalities. Limited dental evaluation as described above. Electronically Signed   By: Skipper Cliche M.D.   On: 11/12/2015 15:54   I have personally reviewed and evaluated these images and lab results as part of my medical decision-making.   EKG Interpretation   Date/Time:  Monday November 12 2015 12:48:48 EST Ventricular Rate:  71 PR Interval:  150 QRS Duration: 114 QT Interval:  397 QTC Calculation: 431 R Axis:   118 Text Interpretation:  Sinus rhythm IRBBB and LPFB Borderline low voltage,  extremity leads  Baseline wander in lead(s) II III aVL aVF BBB is new since  2000 Confirmed by Khloi Rawl  MD, Deyonna Fitzsimmons ([id number]) on 11/12/2015 12:57:47 PM      MDM   Final diagnoses:  Syncope and collapse  Closed fracture of mandible, unspecified mandibular site, initial encounter [facility])    Patient with syncope and subsequent mandible fracture from the fall. Syncope is of unclear etiology. She was not feeling bad except for very brief dizziness right before falling. There is some concern for a possible arrhythmia as the cause. She has new right bundle branch block and left fascicular block, but this is compared to 2000 so exact onset is unclear. Given some transient dyspnea workup for PE was obtained and is negative. She will need monitoring for the syncope. ENT will need to be consulted for the fracture.     Sherwood Gambler, MD 11/12/15 [phone]

## 2015-11-12 NOTE — Progress Notes (Signed)
Spoke with pt daughter, [name]. Updated on current patient status and corrected contact information.   Will continue to monitor.   Vanessa Gibson

## 2015-11-12 NOTE — ED Provider Notes (Signed)
LACERATION REPAIR Performed by: Monico Blitz Consent: Verbal consent obtained. Risks and benefits: risks, benefits and alternatives were discussed Consent given by: patient Patient identity confirmed: Wrist band   Wound explored to depth in good light on a bloodless field, with no foreign bodies seen or palpated.  Prepped and draped in normal sterile fashion     Laceration Location: left upper lip  Laceration Length: 1cm  Anesthesia: Lidocaine 1% Anesthesia: Inferior orbital block left-sided  Anesthetic total: 2 ml  Irrigation method: Low Pressure  Amount of cleaning: Normal  Skin closure: 5-0 Vicryl Rapide  Number of sutures:  3  Technique:  simple interrupted  Patient tolerance: Patient tolerated the procedure well with no immediate complications.       Monico Blitz, PA-C 11/12/15 1537  Sherwood Gambler, MD 11/16/15 [phone]

## 2015-11-12 NOTE — ED Notes (Addendum)
Myself and Debbie, EMT undressed patient, placed in gown, on monitor, continuous pulse oximetry and blood pressure cuff; warm blankets given; patient arrived via [facility] with c-collar intact

## 2015-11-12 NOTE — ED Notes (Signed)
Pt arrives from [place] dizziness prior to syncopal episode. It was stated that pt hit head but do not know if it was just on the lamp or the lamp and floor. Pt is A/O x4. Pt also knock out some of her denture teeth during this episode and has a laceration to the top lip.

## 2015-11-13 ENCOUNTER — Other Ambulatory Visit: Payer: Self-pay | Admitting: Physician Assistant

## 2015-11-13 ENCOUNTER — Inpatient Hospital Stay (HOSPITAL_COMMUNITY): Payer: 59

## 2015-11-13 DIAGNOSIS — J309 Allergic rhinitis, unspecified: Secondary | ICD-10-CM

## 2015-11-13 DIAGNOSIS — E1159 Type 2 diabetes mellitus with other circulatory complications: Secondary | ICD-10-CM

## 2015-11-13 DIAGNOSIS — I10 Essential (primary) hypertension: Secondary | ICD-10-CM

## 2015-11-13 DIAGNOSIS — K219 Gastro-esophageal reflux disease without esophagitis: Secondary | ICD-10-CM

## 2015-11-13 DIAGNOSIS — R55 Syncope and collapse: Principal | ICD-10-CM

## 2015-11-13 DIAGNOSIS — I252 Old myocardial infarction: Secondary | ICD-10-CM

## 2015-11-13 DIAGNOSIS — E43 Unspecified severe protein-calorie malnutrition: Secondary | ICD-10-CM | POA: Insufficient documentation

## 2015-11-13 DIAGNOSIS — R011 Cardiac murmur, unspecified: Secondary | ICD-10-CM

## 2015-11-13 DIAGNOSIS — K861 Other chronic pancreatitis: Secondary | ICD-10-CM

## 2015-11-13 LAB — CBC
HCT: 31.3 % — ABNORMAL LOW (ref 36.0–46.0)
HEMOGLOBIN: 10.2 g/dL — AB (ref 12.0–15.0)
MCH: 30.7 pg (ref 26.0–34.0)
MCHC: 32.6 g/dL (ref 30.0–36.0)
MCV: 94.3 fL (ref 78.0–100.0)
PLATELETS: 214 10*3/uL (ref 150–400)
RBC: 3.32 MIL/uL — AB (ref 3.87–5.11)
RDW: 12.7 % (ref 11.5–15.5)
WBC: 7.8 10*3/uL (ref 4.0–10.5)

## 2015-11-13 LAB — GLUCOSE, CAPILLARY
GLUCOSE-CAPILLARY: 155 mg/dL — AB (ref 65–99)
GLUCOSE-CAPILLARY: 137 mg/dL — AB (ref 65–99)
GLUCOSE-CAPILLARY: 180 mg/dL — AB (ref 65–99)
Glucose-Capillary: 139 mg/dL — ABNORMAL HIGH (ref 65–99)

## 2015-11-13 LAB — COMPREHENSIVE METABOLIC PANEL
ALT: 14 U/L (ref 14–54)
ANION GAP: 8 (ref 5–15)
AST: 19 U/L (ref 15–41)
Albumin: 3.1 g/dL — ABNORMAL LOW (ref 3.5–5.0)
Alkaline Phosphatase: 27 U/L — ABNORMAL LOW (ref 38–126)
BUN: 16 mg/dL (ref 6–20)
CHLORIDE: 104 mmol/L (ref 101–111)
CO2: 26 mmol/L (ref 22–32)
CREATININE: 1.02 mg/dL — AB (ref 0.44–1.00)
Calcium: 9.1 mg/dL (ref 8.9–10.3)
GFR, EST AFRICAN AMERICAN: 56 mL/min — AB (ref >60)
GFR, EST NON AFRICAN AMERICAN: 49 mL/min — AB (ref >60)
Glucose, Bld: 158 mg/dL — ABNORMAL HIGH (ref 65–99)
Potassium: 4.7 mmol/L (ref 3.5–5.1)
SODIUM: 138 mmol/L (ref 135–145)
Total Bilirubin: 0.5 mg/dL (ref 0.3–1.2)
Total Protein: 5 g/dL — ABNORMAL LOW (ref 6.5–8.1)

## 2015-11-13 LAB — TROPONIN I

## 2015-11-13 LAB — HEMOGLOBIN A1C
HEMOGLOBIN A1C: 6.8 % — AB (ref 4.8–5.6)
MEAN PLASMA GLUCOSE: 148 mg/dL

## 2015-11-13 MED ORDER — ACETAMINOPHEN 325 MG PO TABS: 650.0000 mg | ORAL_TABLET | Freq: Four times a day (QID) | ORAL | Status: DC | PRN

## 2015-11-13 MED ORDER — GLUCERNA SHAKE PO LIQD
237.0000 mL | Freq: Three times a day (TID) | ORAL | Status: DC
  Administered 2015-11-14: 237 mL via ORAL
  Filled 2015-11-13: qty 237

## 2015-11-13 MED ORDER — ALPRAZOLAM 0.25 MG PO TABS
0.2500 mg | ORAL_TABLET | Freq: Once | ORAL | Status: AC
  Administered 2015-11-13: 0.25 mg via ORAL
  Filled 2015-11-13: qty 1

## 2015-11-13 MED ORDER — ACETAMINOPHEN 325 MG PO TABS
650.0000 mg | ORAL_TABLET | Freq: Four times a day (QID) | ORAL | Status: DC | PRN
  Administered 2015-11-13 – 2015-11-14 (×3): 650 mg via ORAL
  Filled 2015-11-13 (×3): qty 2

## 2015-11-13 NOTE — Progress Notes (Signed)
Initial Nutrition Assessment  DOCUMENTATION CODES:   Severe malnutrition in context of chronic illness, Underweight  INTERVENTION:   Glucerna Shake po TID, each supplement provides 220 kcal and 10 grams of protein  NUTRITION DIAGNOSIS:   Malnutrition related to chronic illness as evidenced by severe depletion of body fat, severe depletion of muscle mass  GOAL:   Patient will meet greater than or equal to 90% of their needs  MONITOR:   PO intake, Supplement acceptance, Labs, Weight trends, I & O's  REASON FOR ASSESSMENT:   Malnutrition Screening Tool  ASSESSMENT:   81 yo Female with PMH of type II DM, CAD, hyperlipidemia, chronic vertigo, pancreatitis, presented with a syncopal episode which occurred while she was standing,trying to eat some soup, waiting for her daughter in the independent living facility where she lives.. She became dizzy and then passed out. She fell and struck her head and broke her jaw in 3 places.  Patient reports a decreased appetite.  Currently on a Soft diet.  No % PO intake records available.  States she's lost 40 lbs in the last "few" months; etiology unknown.  Pt likes Glucerna Shake.  RD to order.  Nutrition-Focused physical exam completed. Findings are severe fat depletion, severe muscle depletion, and no edema.   Diet Order:  DIET SOFT Room service appropriate?: Yes; Fluid consistency:: Thin  Skin:  Reviewed, no issues  Last BM:  12/19  Height:   Ht Readings from Last 1 Encounters:  11/12/15 5\' 2"  (1.575 m)    Weight:   Wt Readings from Last 1 Encounters:  11/12/15 96 lb 4.8 oz (43.681 kg)    Ideal Body Weight:  50 kg  BMI:  Body mass index is 17.61 kg/(m^2).  Estimated Nutritional Needs:   Kcal:  1200-1400  Protein:  50-60 gm  Fluid:  >/= 1.5 L  EDUCATION NEEDS:   No education needs identified at this time  Arthur Holms, RD, LDN Pager #: [phone] After-Hours Pager #: [phone]

## 2015-11-13 NOTE — Progress Notes (Signed)
Subjective: NAEON.  Patient denies chest pain, palpitations, or SOB.  Her only complaint is a swollen lip 2/2 the fall.  She states she has had fainting spells in the past, but cannot describe when, how, or why.  She is unsure why she has chronic pancreatitis.  Objective: Vital signs in last 24 hours: Filed Vitals:   11/12/15 1700 11/12/15 1726 11/12/15 2053 11/13/15 0521  BP: 123/50 136/84 127/57 154/56  Pulse: 83 84 68 68  Temp:  99.1 F (37.3 C) 98.5 F (36.9 C) 98.4 F (36.9 C)  TempSrc:  Oral Oral Oral  Resp: 17 18 18 20   Height:  5\' 2"  (1.575 m)    Weight:  96 lb 4.8 oz (43.681 kg)    SpO2: 98% 97% 100% 100%   Weight change:   Intake/Output Summary (Last 24 hours) at 11/13/15 1332 Last data filed at 11/13/15 0800  Gross per 24 hour  Intake      0 ml  Output    225 ml  Net   -225 ml   Physical Exam  Constitutional: She is oriented to person, place, and time.  Frail, elderly female, lying in bed.  In good spirits, NAD  HENT:  Head: Normocephalic.  Sutured upper lip with mild swelling.  Patient unable to open jaw fully.  Eyes: EOM are normal. No scleral icterus.  Neck: No JVD present. No tracheal deviation present.  Cardiovascular: Normal rate, regular rhythm and intact distal pulses.   Grade III/VI systolic decrescendo murmur heard best at RUSB.  Pulmonary/Chest: Effort normal. No respiratory distress. She has no wheezes.  Minimal crackles in bilateral bases.  Abdominal: Soft. She exhibits no distension. There is no tenderness. There is no rebound and no guarding.  Musculoskeletal: She exhibits no edema.  Neurological: She is alert and oriented to person, place, and time.  Skin: Skin is warm and dry. No rash noted.    Lab Results: Basic Metabolic Panel:  Recent Labs Lab 11/12/15 1305 11/13/15 0508  NA 138 138  K 4.9 4.7  CL 104 104  CO2 23 26  GLUCOSE 195* 158*  BUN 22* 16  CREATININE 1.13* 1.02*  CALCIUM 9.1 9.1   Liver Function Tests:  Recent  Labs Lab 11/13/15 0508  AST 19  ALT 14  ALKPHOS 27*  BILITOT 0.5  PROT 5.0*  ALBUMIN 3.1*   No results for input(s): LIPASE, AMYLASE in the last 168 hours. No results for input(s): AMMONIA in the last 168 hours. CBC:  Recent Labs Lab 11/12/15 1305 11/13/15 0508  WBC 8.1 7.8  HGB 10.7* 10.2*  HCT 32.0* 31.3*  MCV 93.3 94.3  PLT 209 214   Cardiac Enzymes:  Recent Labs Lab 11/12/15 1847 11/12/15 2324 11/13/15 0508  TROPONINI <0.03 <0.03 <0.03   BNP: No results for input(s): PROBNP in the last 168 hours. D-Dimer:  Recent Labs Lab 11/12/15 1324  DDIMER 1.55*   CBG:  Recent Labs Lab 11/12/15 2051 11/13/15 0607  GLUCAP 116* 139*   Hemoglobin A1C:  Recent Labs Lab 11/12/15 1847  HGBA1C 6.8*   Fasting Lipid Panel: No results for input(s): CHOL, HDL, LDLCALC, TRIG, CHOLHDL, LDLDIRECT in the last 168 hours. Thyroid Function Tests:  Recent Labs Lab 11/12/15 1847  TSH 1.097   Coagulation: No results for input(s): LABPROT, INR in the last 168 hours. Anemia Panel: No results for input(s): VITAMINB12, FOLATE, FERRITIN, TIBC, IRON, RETICCTPCT in the last 168 hours. Urine Drug Screen: Drugs of Abuse  No results found  for: LABOPIA, COCAINSCRNUR, LABBENZ, AMPHETMU, THCU, LABBARB  Alcohol Level: No results for input(s): ETH in the last 168 hours. Urinalysis:  Recent Labs Lab 11/12/15 1603  COLORURINE YELLOW  LABSPEC 1.036*  PHURINE 5.5  GLUCOSEU 100*  HGBUR NEGATIVE  BILIRUBINUR NEGATIVE  KETONESUR 15*  PROTEINUR NEGATIVE  NITRITE NEGATIVE  LEUKOCYTESUR NEGATIVE   Misc. Labs:   Micro Results: No results found for this or any previous visit (from the past 240 hour(s)). Studies/Results: Ct Head Wo Contrast  11/12/2015  CLINICAL DATA:  [place] several hours ago today, dizzy, fell face down and broke upper teeth with lip laceration EXAM: CT HEAD WITHOUT CONTRAST CT MAXILLOFACIAL WITHOUT CONTRAST CT CERVICAL SPINE WITHOUT CONTRAST TECHNIQUE:  Multidetector CT imaging of the head, cervical spine, and maxillofacial structures were performed using the standard protocol without intravenous contrast. Multiplanar CT image reconstructions of the cervical spine and maxillofacial structures were also generated. COMPARISON:  None. FINDINGS: CT HEAD FINDINGS Age-related atrophy and low attenuation in the deep white matter. No evidence of hemorrhage or extra-axial fluid. No mass, infarct, or hydrocephalus. No skull fracture. CT MAXILLOFACIAL FINDINGS There is a comminuted fracture involving the right mandibular condyles with significant displacement of 3 primary fracture fragments. The largest fragment is displaced medially and inferiorly by about 1 cm. No other facial bone fractures are identified. Dental evaluation extremely limited due to beam attenuation artifact from implants. CT CERVICAL SPINE FINDINGS Normal alignment. No paraspinous soft tissue swelling. No fracture. Multi level degenerative disk disease. Lung apices clear. IMPRESSION: Comminuted fracture right mandibular condyle. Otherwise no acute abnormalities. Limited dental evaluation as described above. Electronically Signed   By: Skipper Cliche M.D.   On: 11/12/2015 15:54   Ct Angio Chest Pe W/cm &/or Wo Cm  11/12/2015  CLINICAL DATA:  Syncope, positive D-dimer EXAM: CT ANGIOGRAPHY CHEST WITH CONTRAST TECHNIQUE: Multidetector CT imaging of the chest was performed using the standard protocol during bolus administration of intravenous contrast. Multiplanar CT image reconstructions and MIPs were obtained to evaluate the vascular anatomy. CONTRAST:  17mL OMNIPAQUE IOHEXOL 350 MG/ML SOLN COMPARISON:  None. FINDINGS: Sagittal images of the spine shows degenerative changes thoracolumbar spine. Images of the thoracic inlet are unremarkable. Central airways are patent. Atherosclerotic calcifications of thoracic aorta. No pulmonary embolus is noted. Images of the lung parenchyma shows no acute infiltrate  or pulmonary edema. Mild dependent atelectasis noted lung bases posteriorly. Atherosclerotic calcifications of coronary arteries. No mediastinal hematoma or adenopathy. No hilar adenopathy. The visualized upper abdomen shows intrahepatic and extrahepatic biliary ductal dilatation. There is CBD dilatation up to 1.5 cm. The patient is status postcholecystectomy. There is a low-density nodule probable adenoma right adrenal gland measures 1 cm. Review of the MIP images confirms the above findings. IMPRESSION: 1. No pulmonary embolus is noted. 2. No acute infiltrate or pulmonary edema. 3. Mild atherosclerotic calcifications of thoracic aorta. Atherosclerotic calcifications of coronary arteries. 4. There is intrahepatic biliary ductal dilatation. Significant CBD dilatation up to 1.5 cm. Status postcholecystectomy. 5. Probable right adrenal adenoma measures 1 cm. Electronically Signed   By: Lahoma Crocker M.D.   On: 11/12/2015 15:41   Ct Cervical Spine Wo Contrast  11/12/2015  CLINICAL DATA:  [place] several hours ago today, dizzy, fell face down and broke upper teeth with lip laceration EXAM: CT HEAD WITHOUT CONTRAST CT MAXILLOFACIAL WITHOUT CONTRAST CT CERVICAL SPINE WITHOUT CONTRAST TECHNIQUE: Multidetector CT imaging of the head, cervical spine, and maxillofacial structures were performed using the standard protocol without intravenous contrast. Multiplanar CT image  reconstructions of the cervical spine and maxillofacial structures were also generated. COMPARISON:  None. FINDINGS: CT HEAD FINDINGS Age-related atrophy and low attenuation in the deep white matter. No evidence of hemorrhage or extra-axial fluid. No mass, infarct, or hydrocephalus. No skull fracture. CT MAXILLOFACIAL FINDINGS There is a comminuted fracture involving the right mandibular condyles with significant displacement of 3 primary fracture fragments. The largest fragment is displaced medially and inferiorly by about 1 cm. No other facial bone fractures  are identified. Dental evaluation extremely limited due to beam attenuation artifact from implants. CT CERVICAL SPINE FINDINGS Normal alignment. No paraspinous soft tissue swelling. No fracture. Multi level degenerative disk disease. Lung apices clear. IMPRESSION: Comminuted fracture right mandibular condyle. Otherwise no acute abnormalities. Limited dental evaluation as described above. Electronically Signed   By: Skipper Cliche M.D.   On: 11/12/2015 15:54   Ct Maxillofacial Wo Cm  11/12/2015  CLINICAL DATA:  [place] several hours ago today, dizzy, fell face down and broke upper teeth with lip laceration EXAM: CT HEAD WITHOUT CONTRAST CT MAXILLOFACIAL WITHOUT CONTRAST CT CERVICAL SPINE WITHOUT CONTRAST TECHNIQUE: Multidetector CT imaging of the head, cervical spine, and maxillofacial structures were performed using the standard protocol without intravenous contrast. Multiplanar CT image reconstructions of the cervical spine and maxillofacial structures were also generated. COMPARISON:  None. FINDINGS: CT HEAD FINDINGS Age-related atrophy and low attenuation in the deep white matter. No evidence of hemorrhage or extra-axial fluid. No mass, infarct, or hydrocephalus. No skull fracture. CT MAXILLOFACIAL FINDINGS There is a comminuted fracture involving the right mandibular condyles with significant displacement of 3 primary fracture fragments. The largest fragment is displaced medially and inferiorly by about 1 cm. No other facial bone fractures are identified. Dental evaluation extremely limited due to beam attenuation artifact from implants. CT CERVICAL SPINE FINDINGS Normal alignment. No paraspinous soft tissue swelling. No fracture. Multi level degenerative disk disease. Lung apices clear. IMPRESSION: Comminuted fracture right mandibular condyle. Otherwise no acute abnormalities. Limited dental evaluation as described above. Electronically Signed   By: Skipper Cliche M.D.   On: 11/12/2015 15:54   Medications:  I have reviewed the patient's current medications. Scheduled Meds: . aspirin EC  81 mg Oral Daily  . enoxaparin (LOVENOX) injection  30 mg Subcutaneous Q24H  . feeding supplement (GLUCERNA SHAKE)  237 mL Oral TID BM  . fluticasone  1 spray Each Nare Daily  . insulin aspart  0-5 Units Subcutaneous QHS  . insulin aspart  0-9 Units Subcutaneous TID WC  . isosorbide mononitrate  45 mg Oral Daily  . lipase/protease/amylase  12,000 Units Oral TID WC  . lisinopril  5 mg Oral Daily  . metoprolol tartrate  25 mg Oral BID  . pantoprazole  40 mg Oral Daily  . pravastatin  20 mg Oral QHS  . sodium chloride  3 mL Intravenous Q12H   Continuous Infusions: . sodium chloride 75 mL/hr at 11/12/15 1815   PRN Meds:.acetaminophen, meclizine, morphine injection, nitroGLYCERIN, ondansetron **OR** ondansetron (ZOFRAN) IV, polyethylene glycol Assessment/Plan: Active Problems:   Syncope   Protein-calorie malnutrition, severe   Chronic pancreatitis ([place])   Diabetes mellitus, type II ([place])   GERD (gastroesophageal reflux disease)   Essential hypertension   Hyperlipidemia  Ms. Lebon is an 81 yo female with HTN, HLD, DMII, chronic pancreatitis, h/o MI, and h/o breast cancer, presenting with syncope and jaw fracture.   Syncope: Patient presents with syncope while standing. She has a h/o vertigo with similar symptoms preceding the syncopal episode. She denies  a history, signs, or symptoms of stroke, seizure, or arrhythmia. CTA negative for PE. Symptoms likely due to combination of factors, including prolonged standing, orthostasis from previous N/V/D and decreased PO intake, and polypharmacy. She is on multiple medications that can cause syncope in the elderly, including Xanax, Clorazepate, Donepezil, Duloxetine, and Oxybutynin. Given her systolic ejection murmur, AS may play a role as well. Will r/o ACS, get Echo, and perform medication reconciliation. Troponins negative.  Orthostatics could not be  complicated 2/2 vertigo/dizziness.  Still concern for arhythmia given patient's IRBBB on ECG, so will consult cardiology for evaluation.  Cardiology recommending 30 day event monitor to r/o arhythmia. [ ]  TTE [ ]  Recheck orthostatics - Telemetry - Meclizine 12.5 mg BID PRN - Zofran 4 mg q6h PRN - HOLD Xanax, Clorazepate, Donepezil, Duloxetine, and Oxybutynin - PT/OT [ ]  Holter monitor  Mandibular Fracture: Patient has a 3 fragment mandibular fracture. ENT consulted in the ED and reconsulted today. Will provide pain control and await ENT recommendations. - Morphine 2mg  IV q4h PRN - Tylenol - ENT consult  HTN, HLD, and H/o MI: Last MI ~20 years ago. No records of CAD or CHF. Will r/o ACS and get Echo as part of syncope evaluation. - ASA - Metoprolol tartrate 25 mg BID - Lisinopril 5 mg - Imdur 45 mg  - Pravastatin 20 mg - Nitro PRN  DMII: Unknown A1c. Patient on Glipizide-Metformin at home. - HOLD Glipizide-Metformin - SSI [ ]  A1c  Chronic Pancreatitis: - Creon - HOLD Bentyl, Eluxadoline, and Tolterodine  Allergies: Flonase and Singulair  GERD: Protonix  FEN/GI: - NPO pending surgical evaluation, then Soft  DVT Ppx: Lovenox  Dispo: Disposition is deferred at this time, awaiting improvement of current medical problems.  Anticipated discharge in approximately 2-3 day(s).   The patient does not have a current PCP (No Pcp Per Patient) and does need an [facility] hospital follow-up appointment after discharge.  The patient does not have transportation limitations that hinder transportation to clinic appointments.  .Services Needed at time of discharge: Y = Yes, Blank = No PT:   OT:   RN:   Equipment:   Other:     LOS: 1 day   Iline Oven, MD 11/13/2015, 1:32 PM

## 2015-11-13 NOTE — Consult Note (Signed)
Reason for Consult: Mandible Fracture  Referring Physician: Dr. [name] is an 81 y.o. female.  HPI: The patient is a 81 y.o. Female that just moved from [place] into an assisted living community, [facility].  She had a syncopal event and sustained a ground-level fall fracturing her right mandibular condyle.  She also sustained a small upper lip laceration, which was closed in the ER.    PMHx:  Reviewed  PSx:  Past Surgical History  Procedure Laterality Date  . Cholecystectomy open  1970's  . Breast surgery    . Mastectomy Bilateral   . Cataract extraction w/ intraocular lens  implant, bilateral Bilateral     Family Hx: History reviewed. No pertinent family history.  Social Hx:  reports that she has never smoked. She has never used smokeless tobacco. She reports that she does not drink alcohol or use illicit drugs.  Allergies: No Known Allergies  Medications: I have reviewed the patient's current medications.   Radiology  CT MAXILLOFACIAL FINDINGS There is a comminuted fracture involving the right mandibular condyles with significant displacement of 3 primary fracture fragments. The largest fragment is displaced medially and inferiorly by about 1 cm. No other facial bone fractures are identified. Dental evaluation extremely limited due to beam attenuation artifact from implants. CT CERVICAL SPINE FINDINGS Normal alignment. No paraspinous soft tissue swelling. No fracture. Multi level degenerative disk disease. Lung apices clear. IMPRESSION: Comminuted fracture right mandibular condyle. Otherwise no acute abnormalities. Limited dental evaluation as described above. Electronically Signed   By: Skipper Cliche M.D.   On: 11/12/2015 15:54  [id number] items are noted in HPI.  Vital Signs: BP 154/56 mmHg  Pulse 66  Temp(Src) 98.5 F (36.9 C) (Oral)  Resp 20  Ht 5\' 2"  (1.575 m)  Wt 43.681 kg (96 lb 4.8 oz)  BMI 17.61 kg/m2  SpO2 98%  Physical Exam: General  appearance: alert and cooperative Head: Normocephalic, without obvious abnormality Eyes: conjunctivae/corneas clear. PERRL, EOM's intact. Fundi benign. Ears: abnormal TM right ear - decreased hearing right ear, however, this was present prior to injury along with pain.  The patient reports that she did see an ENT in [place] and stated that things wer fine.   Nose: Nares normal. Septum midline. Mucosa normal. No drainage or sinus tenderness. Throat: The patient's over-denture is fractured (anterior teeth are fracture off).  The patient has limited opening, about 30 mm.     Her occlusion is stable and repeatable. She does have pain along the right TMJ intraorally and extraorally.  Cranial nerves V, VII are intact.     Assessment/Plan:  The patient has a right condyle fracture of the mandible.  Her occlusion is stable.   1. The fracture is non-operative.  If we wire her jaw she will develop ankylosis of the condyle to the glenoid fossa.  If we place titanium screws she will develop in avascular necrosis; therefore, no surgical intervention is indicated. 2. I will follow the patient in my office.  She needs to call [phone] for a follow up in 2 to 3 weeks. 3. The patient is stating the Tylenol is controlling her pain.  I recommend that we continue with that.   4. I recommend that the patient eat a soft diet, chew, and talk all has her physical therapy to prevent ankylosis of the right joint.   5. I recommend she pick a dentist and follow up to have a new over-denture or have the old one repaired if  possible.    -Please see: Dr. Alean Rinne, Dr. Greggory Keen, or Dr. Tama Gander 6. Finally, I recommend the patient see an ENT to evaluate the pre-injury ear pain and decreased hearing.   Spent 40 minutes with the patient and family.     [place],Vanessa Gibson  11/13/2015, 6:54 PM

## 2015-11-13 NOTE — Progress Notes (Signed)
UR Completed. Avion Patella, RN, BSN.  [phone] 

## 2015-11-13 NOTE — Progress Notes (Signed)
RN obtained orthostatic vital signs. Pt reported dizziness with right ear pain when standing. Could not complete orthostatic vitals. Pt vss and now resting comfortably. Bed alarm engaged.   Will continue to monitor.   Vanessa Gibson

## 2015-11-13 NOTE — Progress Notes (Signed)
PT Cancellation Note  Patient Details Name: Vanessa Gibson MRN: [id number] DOB: September 19, 1928   Cancelled Treatment:    Reason Eval/Treat Not Completed: Patient declined, no reason specified Pt upset about possibly needing surgery and declines working with PT, "I just don't feel up to it." Explained importance but pt focused on need for surgery. Will follow up.   Marguarite Arbour A Barbie Croston 11/13/2015, 10:27 AM Wray Kearns, PT, DPT [phone]

## 2015-11-13 NOTE — Progress Notes (Signed)
  Echocardiogram 2D Echocardiogram has been performed.  Jennette Dubin 11/13/2015, 2:35 PM

## 2015-11-13 NOTE — Evaluation (Addendum)
Occupational Therapy Evaluation Patient Details Name: Vanessa Gibson MRN: [id number] DOB: [birth year] Today's Date: 11/13/2015    History of Present Illness 81 year old female presents after syncope. She was in her independent living facility and had very brief dizziness and then passed out. She hit her head and face on the lamp and/or floor. Patient was waiting on her daughter arrive while eating soup while standing. States she medially felt dizzy while having been standing for a while and then passed out.  PMH includes Diabetes mellitus without complication, hypertension, chronic pancreatitis, IBS, dizziness, giddiness, hyperlipidemia, cancer, and MI.   Clinical Impression   Pt admitted with above. Pt independent with ADLs, PTA. Feel pt will benefit from acute OT to increase independence prior to d/c. Pt planning to stay with daughter initially.    Follow Up Recommendations  No OT follow up;Supervision/Assistance - 24 hour (at least initially)   Equipment Recommendations  3 in 1 bedside comode    Recommendations for Other Services       Precautions / Restrictions Precautions Precautions: Fall Restrictions Weight Bearing Restrictions: No      Mobility Bed Mobility Overal bed mobility: Needs Assistance Bed Mobility: Supine to Sit;Sit to Supine     Supine to sit: Supervision Sit to supine: Modified independent (Device/Increase time)      Transfers Overall transfer level: Needs assistance   Transfers: Sit to/from Stand Sit to Stand: Min guard              Balance    Pt required support with single leg stance when simulating functional activity.                                         ADL Overall ADL's : Needs assistance/impaired     Grooming: Wash/dry face;Set up;Sitting;Supervision/safety   Upper Body Bathing: Set up;Sitting;Supervision/ safety   Lower Body Bathing: Min guard;Sit to/from stand (Min guard standing on one leg to reach  feet) Lower Body Bathing Details (indicate cue type and reason): washed peri area off while supine in bed; managed panties as well     Lower Body Dressing: Min guard;Sit to/from stand   Toilet Transfer: Min guard;Ambulation (sit to stand from bed)           Functional mobility during ADLs: Min guard  Comments: Pt lightheaded in session.       Vision  Reports blurry vision   Perception     Praxis      Pertinent Vitals/Pain Pain Assessment: 0-10 Pain Score: 4  Pain Location: right ear, lip, right wrist, back, eyes Pain Descriptors / Indicators: Pressure;Sore (pressure in eyes) Pain Intervention(s): Monitored during session (notified nurse)     Hand Dominance     Extremity/Trunk Assessment Upper Extremity Assessment Upper Extremity Assessment: Generalized weakness (pain in back when resistance applied to arms to test strengt)   Lower Extremity Assessment Lower Extremity Assessment: Defer to PT evaluation       Communication Communication Communication: HOH   Cognition Arousal/Alertness: Awake/alert Behavior During Therapy: WFL for tasks assessed/performed Overall Cognitive Status: Within Functional Limits for tasks assessed                     General Comments       Exercises       Shoulder Instructions      Home Living Family/patient expects to be discharged to:: Private residence Living  Arrangements: Children (plan to stay with daughter initially) Available Help at Discharge: Family;Available PRN/intermittently Type of Home: House       Home Layout: Two level (has a bathroom on main level but no bedroom)     Bathroom Shower/Tub: Tub only;Walk-in shower   Bathroom Toilet:  (elevated toilet)     Home Equipment: Cane - single point   Additional Comments: home setup information for daughter's house; pt from independent living facility      Prior Functioning/Environment Level of Independence: Needs assistance  Gait / Transfers  Assistance Needed: used cane sometimes ADL's / Homemaking Assistance Needed: assist with cleaning and aid would drive her places        OT Diagnosis: Generalized weakness;Acute pain   OT Problem List: Decreased strength;Pain;Decreased knowledge of precautions;Decreased knowledge of use of DME or AE;Impaired balance (sitting and/or standing);Decreased activity tolerance;Impaired vision/perception   OT Treatment/Interventions: Self-care/ADL training;DME and/or AE instruction;Therapeutic activities;Patient/family education;Balance training;Visual/perceptual remediation/compensation;Therapeutic exercise    OT Goals(Current goals can be found in the care plan section) Acute Rehab OT Goals Patient Stated Goal: I just want to be peaceful OT Goal Formulation: With patient Time For Goal Achievement: 11/20/15 Potential to Achieve Goals: Good ADL Goals Pt Will Perform Lower Body Bathing: sit to/from stand;with set-up Pt Will Perform Lower Body Dressing: sit to/from stand;with set-up Pt Will Transfer to Toilet: ambulating;with supervision Pt Will Perform Toileting - Clothing Manipulation and hygiene: sit to/from stand;with set-up  OT Frequency: Min 2X/week   Barriers to D/C:            Co-evaluation              End of Session Equipment Utilized During Treatment: Gait belt Nurse Communication: Other (comment) (pain; did not get orthostatics)  Activity Tolerance: lightheaded in session-did not ambulate her far Patient left: in bed;with call bell/phone within reach;with bed alarm set;with nursing/sitter in room   Time: 0913-0932 OT Time Calculation (min): 19 min Charges:  OT General Charges $OT Visit: 1 Procedure OT Evaluation $Initial OT Evaluation Tier I: 1 Procedure G-CodesBenito Mccreedy OTR/L [id number] 11/13/2015, 10:00 AM

## 2015-11-13 NOTE — Consult Note (Signed)
CARDIOLOGY CONSULT NOTE   Patient ID: Vanessa Gibson MRN: [id number] DOB/AGE: July 16, 1928 81 y.o.  Admit date: 11/12/2015  Primary Physician   No PCP Per Patient Primary Cardiologist   New Reason for Consultation   Syncope  HPI: Vanessa Gibson is a 81 y.o. female with a history of HTN, HL, CAD s/p MI in 1999, chronic pancreatitis, chronic vertigo, chronic R ear pain and breast cancer s/p double mastectomy who admitted 11/12/2015 for evaluation of syncope.   She has a history of MI in 1999. However, unable to provide detailed information. She states that her cath was done in a [facility] at that time. Unable to obtain records. She was living in [place], flew here 11/11/15. He was living at independent living facility while she had a sudden episode of syncope. She was standing to eat something and pass out, no prodrome. However reviewing HNP indicates that she did felt dizzy. She stuck her head and broke her jaw. Her only complaint is weakness. The patient denies nausea, vomiting, fever, chest pain, palpitations, shortness of breath, orthopnea, PND,cough, congestion, abdominal pain, hematochezia, melena, lower extremity edema.  Her d-dimer was elevated. CT angiogram shows no acute PE. Incidentally noted were atherosclerotic calcifications of the coronary arteries, significant common bile duct dilatation status post cholecystectomy and a probable 1 cm right adrenal adenoma. EKG with sinus rhythm and intermittent right bundle branch block. No prior EKG to compare. Troponin x 3 negative. Hemoglobin A1c is 6.8. TSH normal. Blood pressure is stable.  She hasn't seen any cardiologist since many years. However she is on aspirin, Imdur, lisinopril, Lopressor, and pravastatin. Likely prescribed by PCP.   Past Medical History  Diagnosis Date  . Hypertension   . Chronic pancreatitis ([place])   . IBS (irritable bowel syndrome)   . Dizziness   . Giddiness   . Hyperlipemia breast cancer  . MI  (myocardial infarction) ([place])   . Breast cancer ([place])     "both breasts"  . Skin cancer of face     "don't know what kind"  . Type II diabetes mellitus ([place])   . History of hiatal hernia   . GERD (gastroesophageal reflux disease)   . Daily headache   . Arthritis     "all over"  . Anxiety   . Depression      Past Surgical History  Procedure Laterality Date  . Cholecystectomy open  1970's  . Breast surgery    . Mastectomy Bilateral   . Cataract extraction w/ intraocular lens  implant, bilateral Bilateral     No Known Allergies  I have reviewed the patient's current medications . aspirin EC  81 mg Oral Daily  . enoxaparin (LOVENOX) injection  30 mg Subcutaneous Q24H  . feeding supplement (ENSURE ENLIVE)  237 mL Oral BID BM  . fluticasone  1 spray Each Nare Daily  . insulin aspart  0-5 Units Subcutaneous QHS  . insulin aspart  0-9 Units Subcutaneous TID WC  . isosorbide mononitrate  45 mg Oral Daily  . lipase/protease/amylase  12,000 Units Oral TID WC  . lisinopril  5 mg Oral Daily  . metoprolol tartrate  25 mg Oral BID  . pantoprazole  40 mg Oral Daily  . pravastatin  20 mg Oral QHS  . sodium chloride  3 mL Intravenous Q12H   . sodium chloride 75 mL/hr at 11/12/15 1815   acetaminophen, meclizine, morphine injection, nitroGLYCERIN, ondansetron **OR** ondansetron (ZOFRAN) IV, polyethylene glycol  Prior to Admission medications  Medication Sig Start Date End Date Taking? Authorizing Provider  ALPRAZolam (XANAX) 0.25 MG tablet Take 0.25 mg by mouth 2 (two) times daily as needed for anxiety.    Yes Historical Provider, MD  aspirin EC 81 MG tablet Take 81 mg by mouth daily.   Yes Historical Provider, MD  donepezil (ARICEPT) 10 MG tablet Take 10 mg by mouth at bedtime.    Yes Historical Provider, MD  fexofenadine (ALLEGRA) 180 MG tablet Take 180 mg by mouth daily.   Yes Historical Provider, MD  fluticasone (FLONASE) 50 MCG/ACT nasal spray Place 1 spray into both nostrils  daily.   Yes Historical Provider, MD  glipiZIDE-metformin (METAGLIP) 5-500 MG tablet Take 2 tablets by mouth 2 (two) times daily before a meal.   Yes Historical Provider, MD  isosorbide mononitrate (IMDUR) 30 MG 24 hr tablet Take 30 mg by mouth daily.    Yes Historical Provider, MD  lisinopril (PRINIVIL,ZESTRIL) 5 MG tablet Take 5 mg by mouth daily.   Yes Historical Provider, MD  lovastatin (MEVACOR) 20 MG tablet Take 20 mg by mouth at bedtime.   Yes Historical Provider, MD  metoprolol tartrate (LOPRESSOR) 25 MG tablet Take 25 mg by mouth 2 (two) times daily.   Yes Historical Provider, MD  nitroGLYCERIN (NITROSTAT) 0.3 MG SL tablet Place 0.3 mg under the tongue every 5 (five) minutes as needed for chest pain.   Yes Historical Provider, MD  omeprazole (PRILOSEC) 40 MG capsule Take 40 mg by mouth daily.   Yes Historical Provider, MD  ondansetron (ZOFRAN) 4 MG tablet Take 4 mg by mouth every 8 (eight) hours as needed for nausea or vomiting.   Yes Historical Provider, MD  oxybutynin (DITROPAN) 5 MG tablet Take 5 mg by mouth daily.   Yes Historical Provider, MD  Probiotic Product (PROBIOTIC FORMULA) CAPS Take 1 capsule by mouth daily.   Yes Historical Provider, MD     Social History   Social History  . Marital Status: Widowed    Spouse Name: N/A  . Number of Children: N/A  . Years of Education: N/A   Occupational History  . Not on file.   Social History Main Topics  . Smoking status: Never Smoker   . Smokeless tobacco: Never Used  . Alcohol Use: No  . Drug Use: No  . Sexual Activity: No   Other Topics Concern  . Not on file   Social History Narrative  . No narrative on file    The patient is unable to provide a family history.  ROS:  Full 14 point review of systems complete and found to be negative unless listed above.  Physical Exam: Blood pressure 154/56, pulse 68, temperature 98.4 F (36.9 C), temperature source Oral, resp. rate 20, height 5\' 2"  (1.575 m), weight 96 lb 4.8 oz  (43.681 kg), SpO2 100 %.  General: Elderly frail woman laying in bed. No acute distress.  Head: Eyes PERRLA, No xanthomas. Normocephalic and atraumatic, oropharynx without edema or exudate. Left upper lips with suture.  Lungs: Resp regular and unlabored, CTA. Heart: RRR no s3, s4. systolic murmurs. Neck: No carotid bruits. No lymphadenopathy. No JVD. Abdomen: Bowel sounds present, abdomen soft and non-tender without masses or hernias noted. Msk:  No spine or cva tenderness. No weakness, no joint deformities or effusions. Extremities: No clubbing, cyanosis or edema. DP/PT/Radials 2+ and equal bilaterally. Neuro: Alert and oriented X 3. No focal deficits noted. Psych:  Good affect, responds appropriately Skin: No rashes or lesions noted.  Labs:   Lab Results  Component Value Date   WBC 7.8 11/13/2015   HGB 10.2* 11/13/2015   HCT 31.3* 11/13/2015   MCV 94.3 11/13/2015   PLT 214 11/13/2015   No results for input(s): INR in the last 72 hours.  Recent Labs Lab 11/13/15 0508  NA 138  K 4.7  CL 104  CO2 26  BUN 16  CREATININE 1.02*  CALCIUM 9.1  PROT 5.0*  BILITOT 0.5  ALKPHOS 27*  ALT 14  AST 19  GLUCOSE 158*  ALBUMIN 3.1*   No results found for: MG  Recent Labs  11/12/15 1847 11/12/15 2324 11/13/15 0508  TROPONINI <0.03 <0.03 <0.03    Recent Labs  11/12/15 1318  TROPIPOC 0.00   No results found for: PROBNP No results found for: CHOL, HDL, LDLCALC, TRIG Lab Results  Component Value Date   DDIMER 1.55* 11/12/2015   No results found for: LIPASE, AMYLASE TSH  Date/Time Value Ref Range Status  11/12/2015 06:47 PM 1.097 0.350 - 4.500 uIU/mL Final   No results found for: VITAMINB12, FOLATE, FERRITIN, TIBC, IRON, RETICCTPCT  Echo: Pending  ECG:   Vent. rate 71 BPM PR interval 150 ms QRS duration 114 ms QT/QTc 397/431 ms P-R-T axes 56 118 11  Radiology:  Ct Head Wo Contrast  11/12/2015  CLINICAL DATA:  [place] several hours ago today, dizzy, fell face  down and broke upper teeth with lip laceration EXAM: CT HEAD WITHOUT CONTRAST CT MAXILLOFACIAL WITHOUT CONTRAST CT CERVICAL SPINE WITHOUT CONTRAST TECHNIQUE: Multidetector CT imaging of the head, cervical spine, and maxillofacial structures were performed using the standard protocol without intravenous contrast. Multiplanar CT image reconstructions of the cervical spine and maxillofacial structures were also generated. COMPARISON:  None. FINDINGS: CT HEAD FINDINGS Age-related atrophy and low attenuation in the deep white matter. No evidence of hemorrhage or extra-axial fluid. No mass, infarct, or hydrocephalus. No skull fracture. CT MAXILLOFACIAL FINDINGS There is a comminuted fracture involving the right mandibular condyles with significant displacement of 3 primary fracture fragments. The largest fragment is displaced medially and inferiorly by about 1 cm. No other facial bone fractures are identified. Dental evaluation extremely limited due to beam attenuation artifact from implants. CT CERVICAL SPINE FINDINGS Normal alignment. No paraspinous soft tissue swelling. No fracture. Multi level degenerative disk disease. Lung apices clear. IMPRESSION: Comminuted fracture right mandibular condyle. Otherwise no acute abnormalities. Limited dental evaluation as described above. Electronically Signed   By: Skipper Cliche M.D.   On: 11/12/2015 15:54   Ct Angio Chest Pe W/cm &/or Wo Cm  11/12/2015  CLINICAL DATA:  Syncope, positive D-dimer EXAM: CT ANGIOGRAPHY CHEST WITH CONTRAST TECHNIQUE: Multidetector CT imaging of the chest was performed using the standard protocol during bolus administration of intravenous contrast. Multiplanar CT image reconstructions and MIPs were obtained to evaluate the vascular anatomy. CONTRAST:  38mL OMNIPAQUE IOHEXOL 350 MG/ML SOLN COMPARISON:  None. FINDINGS: Sagittal images of the spine shows degenerative changes thoracolumbar spine. Images of the thoracic inlet are unremarkable. Central  airways are patent. Atherosclerotic calcifications of thoracic aorta. No pulmonary embolus is noted. Images of the lung parenchyma shows no acute infiltrate or pulmonary edema. Mild dependent atelectasis noted lung bases posteriorly. Atherosclerotic calcifications of coronary arteries. No mediastinal hematoma or adenopathy. No hilar adenopathy. The visualized upper abdomen shows intrahepatic and extrahepatic biliary ductal dilatation. There is CBD dilatation up to 1.5 cm. The patient is status postcholecystectomy. There is a low-density nodule probable adenoma right adrenal gland measures 1 cm.  Review of the MIP images confirms the above findings. IMPRESSION: 1. No pulmonary embolus is noted. 2. No acute infiltrate or pulmonary edema. 3. Mild atherosclerotic calcifications of thoracic aorta. Atherosclerotic calcifications of coronary arteries. 4. There is intrahepatic biliary ductal dilatation. Significant CBD dilatation up to 1.5 cm. Status postcholecystectomy. 5. Probable right adrenal adenoma measures 1 cm. Electronically Signed   By: Lahoma Crocker M.D.   On: 11/12/2015 15:41   Ct Cervical Spine Wo Contrast  11/12/2015  CLINICAL DATA:  [place] several hours ago today, dizzy, fell face down and broke upper teeth with lip laceration EXAM: CT HEAD WITHOUT CONTRAST CT MAXILLOFACIAL WITHOUT CONTRAST CT CERVICAL SPINE WITHOUT CONTRAST TECHNIQUE: Multidetector CT imaging of the head, cervical spine, and maxillofacial structures were performed using the standard protocol without intravenous contrast. Multiplanar CT image reconstructions of the cervical spine and maxillofacial structures were also generated. COMPARISON:  None. FINDINGS: CT HEAD FINDINGS Age-related atrophy and low attenuation in the deep white matter. No evidence of hemorrhage or extra-axial fluid. No mass, infarct, or hydrocephalus. No skull fracture. CT MAXILLOFACIAL FINDINGS There is a comminuted fracture involving the right mandibular condyles with  significant displacement of 3 primary fracture fragments. The largest fragment is displaced medially and inferiorly by about 1 cm. No other facial bone fractures are identified. Dental evaluation extremely limited due to beam attenuation artifact from implants. CT CERVICAL SPINE FINDINGS Normal alignment. No paraspinous soft tissue swelling. No fracture. Multi level degenerative disk disease. Lung apices clear. IMPRESSION: Comminuted fracture right mandibular condyle. Otherwise no acute abnormalities. Limited dental evaluation as described above. Electronically Signed   By: Skipper Cliche M.D.   On: 11/12/2015 15:54   Ct Maxillofacial Wo Cm  11/12/2015  CLINICAL DATA:  [place] several hours ago today, dizzy, fell face down and broke upper teeth with lip laceration EXAM: CT HEAD WITHOUT CONTRAST CT MAXILLOFACIAL WITHOUT CONTRAST CT CERVICAL SPINE WITHOUT CONTRAST TECHNIQUE: Multidetector CT imaging of the head, cervical spine, and maxillofacial structures were performed using the standard protocol without intravenous contrast. Multiplanar CT image reconstructions of the cervical spine and maxillofacial structures were also generated. COMPARISON:  None. FINDINGS: CT HEAD FINDINGS Age-related atrophy and low attenuation in the deep white matter. No evidence of hemorrhage or extra-axial fluid. No mass, infarct, or hydrocephalus. No skull fracture. CT MAXILLOFACIAL FINDINGS There is a comminuted fracture involving the right mandibular condyles with significant displacement of 3 primary fracture fragments. The largest fragment is displaced medially and inferiorly by about 1 cm. No other facial bone fractures are identified. Dental evaluation extremely limited due to beam attenuation artifact from implants. CT CERVICAL SPINE FINDINGS Normal alignment. No paraspinous soft tissue swelling. No fracture. Multi level degenerative disk disease. Lung apices clear. IMPRESSION: Comminuted fracture right mandibular condyle.  Otherwise no acute abnormalities. Limited dental evaluation as described above. Electronically Signed   By: Skipper Cliche M.D.   On: 11/12/2015 15:54    ASSESSMENT AND PLAN:      Vanessa Gibson is a 81 y.o. female with a history of HTN, HL, CAD s/p MI in 1999, chronic pancreatitis, chronic vertigo, chronic R ear pain and breast cancer s/p double mastectomy who admitted 11/12/2015 for evaluation of syncope.   1. Syncope - Unknown etiology. He was standing and fall. Questionable prodrome of dizziness. Pending echocardiogram. D-dimer was elevated however no PE on CT angiogram of the chest. EKG with sinus rhythm and intermittent right bundle branch block. No prior EKG to compare. Troponin x 3 negative. - Telemetry without  sinus arrhythmia or AV block. - Suspect orthostatic versus polypharmacy. She has a history of dizziness with a right ear pain. Consider re-attempt orthostatic vitals. Differential could include dehydration.  -Will place her on a 30 day event monitor.  2. Systolic murmur - Pending echo  3. Hx of MI in 1999 - Per patient cath done at [facility]. - No anginal pain. - Continue aspirin, Imdur, lisinopril, Lopressor, and pravastatin. Further recommendation pending echocardiogram.  4. HTN - Stable and well controlled.   SignedLeanor Kail, [place] 11/13/2015, 12:19 PM Pager [phone]  Co-Sign MD Personally seen and examined. Agree with above.  Syncope of unknown etiology. She does suffer from occasional vertigo and this may be the underlying reason. Nonetheless, checking an echocardiogram make sense to ensure proper structure and function. Also checking an event monitor may be helpful to ensure that she does not have any dangerous heart arrhythmia such as lengthy pauses which could lead to loss of consciousness. She did suffer a fairly significant fall.  Candee Furbish, MD

## 2015-11-14 ENCOUNTER — Other Ambulatory Visit: Payer: Self-pay | Admitting: Cardiology

## 2015-11-14 DIAGNOSIS — R55 Syncope and collapse: Secondary | ICD-10-CM

## 2015-11-14 LAB — IRON AND TIBC
IRON: 62 ug/dL (ref 28–170)
SATURATION RATIOS: 23 % (ref 10.4–31.8)
TIBC: 274 ug/dL (ref 250–450)
UIBC: 212 ug/dL

## 2015-11-14 LAB — GLUCOSE, CAPILLARY
GLUCOSE-CAPILLARY: 172 mg/dL — AB (ref 65–99)
Glucose-Capillary: 141 mg/dL — ABNORMAL HIGH (ref 65–99)
Glucose-Capillary: 223 mg/dL — ABNORMAL HIGH (ref 65–99)

## 2015-11-14 LAB — RETICULOCYTES
RBC.: 3.18 MIL/uL — ABNORMAL LOW (ref 3.87–5.11)
RETIC CT PCT: 0.5 % (ref 0.4–3.1)
Retic Count, Absolute: 15.9 10*3/uL — ABNORMAL LOW (ref 19.0–186.0)

## 2015-11-14 LAB — FERRITIN: FERRITIN: 19 ng/mL (ref 11–307)

## 2015-11-14 LAB — FOLATE: FOLATE: 20.3 ng/mL (ref >5.9)

## 2015-11-14 LAB — VITAMIN B12: VITAMIN B 12: 558 pg/mL (ref 180–914)

## 2015-11-14 MED ORDER — ACETAMINOPHEN 325 MG PO TABS: 650.0000 mg | ORAL_TABLET | Freq: Four times a day (QID) | ORAL | Status: DC | PRN

## 2015-11-14 MED ORDER — GLUCERNA SHAKE PO LIQD: 237.0000 mL | Freq: Three times a day (TID) | ORAL | Status: DC

## 2015-11-14 MED ORDER — PANCRELIPASE (LIP-PROT-AMYL) 12000-38000 UNITS PO CPEP: 12000.0000 [IU] | ORAL_CAPSULE | Freq: Three times a day (TID) | ORAL | Status: DC

## 2015-11-14 MED ORDER — MECLIZINE HCL 12.5 MG PO TABS: 12.5000 mg | ORAL_TABLET | Freq: Two times a day (BID) | ORAL | Status: DC

## 2015-11-14 NOTE — Progress Notes (Signed)
Occupational Therapy Treatment Patient Details Name: Vanessa Gibson MRN: [id number] DOB: 1928-09-01 Today's Date: 11/14/2015    History of present illness 81 year old female presents after syncope. She was in her independent living facility and had very brief dizziness and then passed out. She hit her head and face on the lamp and/or floor. Patient was waiting on her daughter arrive while eating soup while standing. States she medially felt dizzy while having been standing for a while and then passed out.  PMH includes Diabetes mellitus without complication, hypertension, chronic pancreatitis, IBS, dizziness, giddiness, hyperlipidemia, cancer, MI.   OT comments  Pt progressing towards acute OT goals. Focus of session was toilet transfer and pericare/toileting clothing management. Pt completed OOB tasks at supervision to min guard level. RW utilized during session per pt request. Will defer equipment recommendation for ambulation to PT, discussed this with pt. D/c plan remains appropriate.   Follow Up Recommendations  No OT follow up;Supervision/Assistance - 24 hour    Equipment Recommendations  3 in 1 bedside comode    Recommendations for Other Services      Precautions / Restrictions Precautions Precautions: Fall Restrictions Weight Bearing Restrictions: No       Mobility Bed Mobility Overal bed mobility: Needs Assistance Bed Mobility: Supine to Sit     Supine to sit: Supervision Sit to supine: Modified independent (Device/Increase time)      Transfers Overall transfer level: Needs assistance Equipment used: Rolling walker (2 wheeled);None Transfers: Sit to/from Stand Sit to Stand: Supervision         General transfer comment: cues/reinforcement for hand placement    Balance Overall balance assessment: Needs assistance Sitting-balance support: No upper extremity supported;Feet supported Sitting balance-Leahy Scale: Good     Standing balance support: No upper  extremity supported Standing balance-Leahy Scale: Fair Standing balance comment: stood to wash hands; walked across bathroom threshold with no AD. Does seek external support at times.                    ADL Overall ADL's : Needs assistance/impaired     Grooming: Wash/dry Radiographer, therapeutic: Ambulation;RW;Supervision/safety   Toileting- Water quality scientist and Hygiene: Min guard;Sit to/from stand       Functional mobility during ADLs: Min guard General ADL Comments: Pt requesting to use rw at start of session. "I think I need it, I passed out the other day." Pt completed toilet transfer, pericare and LB clothing management as detailed above. Also stood to wash hands. Instructed pt in techniques for using rw. Discussed with pt that she will have a PT evaluation and will defer question of continued need for rw to PT.      Vision                     Perception     Praxis      Cognition   Behavior During Therapy: WFL for tasks assessed/performed Overall Cognitive Status: Within Functional Limits for tasks assessed                       Extremity/Trunk Assessment  Upper Extremity Assessment Upper Extremity Assessment: Defer to OT evaluation   Lower Extremity Assessment Lower Extremity Assessment: Overall WFL for tasks assessed (proximal, hamstring weakness bil)        Exercises     Shoulder Instructions  General Comments  HR during session 67-75.    Pertinent Vitals/ Pain       Pain Assessment: Faces Faces Pain Scale: Hurts little more Pain Location: back Pain Descriptors / Indicators: Discomfort;Grimacing Pain Intervention(s): Monitored during session;Repositioned  Home Living Family/patient expects to be discharged to:: Private residence Living Arrangements: Children Available Help at Discharge: Family;Available PRN/intermittently Type of Home: House       Home Layout:  Two level     Bathroom Shower/Tub: Tub only;Walk-in shower         Home Equipment: Kasandra Knudsen - single point   Additional Comments: home setup information for daughter's house; pt from independent living facility      Prior Functioning/Environment Level of Independence: Needs assistance  Gait / Transfers Assistance Needed: used cane sometimes ADL's / Homemaking Assistance Needed: assist with cleaning and aid would drive her places       Frequency Min 2X/week     Progress Toward Goals  OT Goals(current goals can now be found in the care plan section)  Progress towards OT goals: Progressing toward goals  Acute Rehab OT Goals Patient Stated Goal: I just want to be peaceful OT Goal Formulation: With patient Time For Goal Achievement: 11/20/15 Potential to Achieve Goals: Good ADL Goals Pt Will Perform Lower Body Bathing: sit to/from stand;with set-up Pt Will Perform Lower Body Dressing: sit to/from stand;with set-up Pt Will Transfer to Toilet: ambulating;with supervision Pt Will Perform Toileting - Clothing Manipulation and hygiene: sit to/from stand;with set-up  Plan Discharge plan remains appropriate    Co-evaluation                 End of Session Equipment Utilized During Treatment: Gait belt;Rolling walker   Activity Tolerance Patient tolerated treatment well   Patient Left in chair;with call bell/phone within reach;with chair alarm set   Nurse Communication          Time: [phone] OT Time Calculation (min): 26 min  Charges: OT General Charges $OT Visit: 1 Procedure OT Treatments $Self Care/Home Management : 23-37 mins  Hortencia Pilar 11/14/2015, 11:01 AM

## 2015-11-14 NOTE — Progress Notes (Signed)
Subjective: NAEON.  She was feeling anxious yesterday evening and requested her home PRN Xanax 0.25 mg.  She currently denies chest pain, palpitations, or SOB.  She is ready to go home.  Objective: Vital signs in last 24 hours: Filed Vitals:   11/13/15 0521 11/13/15 1458 11/13/15 2025 11/14/15 0532  BP: 154/56  120/49 132/59  Pulse: 68 66 76 59  Temp: 98.4 F (36.9 C) 98.5 F (36.9 C) 99.1 F (37.3 C) 98.2 F (36.8 C)  TempSrc: Oral Oral Oral Oral  Resp: 20 20 18 18   Height:      Weight:      SpO2: 100% 98% 97% 99%   Weight change:   Intake/Output Summary (Last 24 hours) at 11/14/15 0901 Last data filed at 11/14/15 0300  Gross per 24 hour  Intake      0 ml  Output    500 ml  Net   -500 ml   Physical Exam  Constitutional: She is oriented to person, place, and time.  Frail, elderly female, lying in bed.  In good spirits, NAD  HENT:  Head: Normocephalic.  Sutured upper lip with mild swelling.  Patient unable to open jaw fully.  Eyes: EOM are normal. No scleral icterus.  Neck: No JVD present. No tracheal deviation present.  Cardiovascular: Normal rate, regular rhythm and intact distal pulses.   Grade II/VI systolic decrescendo murmur heard best at RUSB.  Pulmonary/Chest: Effort normal. No respiratory distress. She has no wheezes.  Minimal crackles in bilateral bases.  Abdominal: Soft. She exhibits no distension. There is no tenderness. There is no rebound and no guarding.  Musculoskeletal: She exhibits no edema.  Neurological: She is alert and oriented to person, place, and time.  Skin: Skin is warm and dry. No rash noted.    Lab Results: Basic Metabolic Panel:  Recent Labs Lab 11/12/15 1305 11/13/15 0508  NA 138 138  K 4.9 4.7  CL 104 104  CO2 23 26  GLUCOSE 195* 158*  BUN 22* 16  CREATININE 1.13* 1.02*  CALCIUM 9.1 9.1   Liver Function Tests:  Recent Labs Lab 11/13/15 0508  AST 19  ALT 14  ALKPHOS 27*  BILITOT 0.5  PROT 5.0*  ALBUMIN 3.1*    No results for input(s): LIPASE, AMYLASE in the last 168 hours. No results for input(s): AMMONIA in the last 168 hours. CBC:  Recent Labs Lab 11/12/15 1305 11/13/15 0508  WBC 8.1 7.8  HGB 10.7* 10.2*  HCT 32.0* 31.3*  MCV 93.3 94.3  PLT 209 214   Cardiac Enzymes:  Recent Labs Lab 11/12/15 1847 11/12/15 2324 11/13/15 0508  TROPONINI <0.03 <0.03 <0.03   BNP: No results for input(s): PROBNP in the last 168 hours. D-Dimer:  Recent Labs Lab 11/12/15 1324  DDIMER 1.55*   CBG:  Recent Labs Lab 11/12/15 2051 11/13/15 0607 11/13/15 1108 11/13/15 1641 11/13/15 2117 11/14/15 0631  GLUCAP 116* 139* 155* 137* 180* 172*   Hemoglobin A1C:  Recent Labs Lab 11/12/15 1847  HGBA1C 6.8*   Fasting Lipid Panel: No results for input(s): CHOL, HDL, LDLCALC, TRIG, CHOLHDL, LDLDIRECT in the last 168 hours. Thyroid Function Tests:  Recent Labs Lab 11/12/15 1847  TSH 1.097   Coagulation: No results for input(s): LABPROT, INR in the last 168 hours. Anemia Panel: No results for input(s): VITAMINB12, FOLATE, FERRITIN, TIBC, IRON, RETICCTPCT in the last 168 hours. Urine Drug Screen: Drugs of Abuse  No results found for: LABOPIA, [place], [place], AMPHETMU, THCU, LABBARB  Alcohol Level: No results for input(s): ETH in the last 168 hours. Urinalysis:  Recent Labs Lab 11/12/15 1603  COLORURINE YELLOW  LABSPEC 1.036*  PHURINE 5.5  GLUCOSEU 100*  HGBUR NEGATIVE  BILIRUBINUR NEGATIVE  KETONESUR 15*  PROTEINUR NEGATIVE  NITRITE NEGATIVE  LEUKOCYTESUR NEGATIVE   Misc. Labs:   Micro Results: No results found for this or any previous visit (from the past 240 hour(s)). Studies/Results: Ct Head Wo Contrast  11/12/2015  CLINICAL DATA:  [place] several hours ago today, dizzy, fell face down and broke upper teeth with lip laceration EXAM: CT HEAD WITHOUT CONTRAST CT MAXILLOFACIAL WITHOUT CONTRAST CT CERVICAL SPINE WITHOUT CONTRAST TECHNIQUE: Multidetector CT  imaging of the head, cervical spine, and maxillofacial structures were performed using the standard protocol without intravenous contrast. Multiplanar CT image reconstructions of the cervical spine and maxillofacial structures were also generated. COMPARISON:  None. FINDINGS: CT HEAD FINDINGS Age-related atrophy and low attenuation in the deep white matter. No evidence of hemorrhage or extra-axial fluid. No mass, infarct, or hydrocephalus. No skull fracture. CT MAXILLOFACIAL FINDINGS There is a comminuted fracture involving the right mandibular condyles with significant displacement of 3 primary fracture fragments. The largest fragment is displaced medially and inferiorly by about 1 cm. No other facial bone fractures are identified. Dental evaluation extremely limited due to beam attenuation artifact from implants. CT CERVICAL SPINE FINDINGS Normal alignment. No paraspinous soft tissue swelling. No fracture. Multi level degenerative disk disease. Lung apices clear. IMPRESSION: Comminuted fracture right mandibular condyle. Otherwise no acute abnormalities. Limited dental evaluation as described above. Electronically Signed   By: Skipper Cliche M.D.   On: 11/12/2015 15:54   Ct Angio Chest Pe W/cm &/or Wo Cm  11/12/2015  CLINICAL DATA:  Syncope, positive D-dimer EXAM: CT ANGIOGRAPHY CHEST WITH CONTRAST TECHNIQUE: Multidetector CT imaging of the chest was performed using the standard protocol during bolus administration of intravenous contrast. Multiplanar CT image reconstructions and MIPs were obtained to evaluate the vascular anatomy. CONTRAST:  27mL OMNIPAQUE IOHEXOL 350 MG/ML SOLN COMPARISON:  None. FINDINGS: Sagittal images of the spine shows degenerative changes thoracolumbar spine. Images of the thoracic inlet are unremarkable. Central airways are patent. Atherosclerotic calcifications of thoracic aorta. No pulmonary embolus is noted. Images of the lung parenchyma shows no acute infiltrate or pulmonary  edema. Mild dependent atelectasis noted lung bases posteriorly. Atherosclerotic calcifications of coronary arteries. No mediastinal hematoma or adenopathy. No hilar adenopathy. The visualized upper abdomen shows intrahepatic and extrahepatic biliary ductal dilatation. There is CBD dilatation up to 1.5 cm. The patient is status postcholecystectomy. There is a low-density nodule probable adenoma right adrenal gland measures 1 cm. Review of the MIP images confirms the above findings. IMPRESSION: 1. No pulmonary embolus is noted. 2. No acute infiltrate or pulmonary edema. 3. Mild atherosclerotic calcifications of thoracic aorta. Atherosclerotic calcifications of coronary arteries. 4. There is intrahepatic biliary ductal dilatation. Significant CBD dilatation up to 1.5 cm. Status postcholecystectomy. 5. Probable right adrenal adenoma measures 1 cm. Electronically Signed   By: Lahoma Crocker M.D.   On: 11/12/2015 15:41   Ct Cervical Spine Wo Contrast  11/12/2015  CLINICAL DATA:  [place] several hours ago today, dizzy, fell face down and broke upper teeth with lip laceration EXAM: CT HEAD WITHOUT CONTRAST CT MAXILLOFACIAL WITHOUT CONTRAST CT CERVICAL SPINE WITHOUT CONTRAST TECHNIQUE: Multidetector CT imaging of the head, cervical spine, and maxillofacial structures were performed using the standard protocol without intravenous contrast. Multiplanar CT image reconstructions of the cervical spine and maxillofacial structures  were also generated. COMPARISON:  None. FINDINGS: CT HEAD FINDINGS Age-related atrophy and low attenuation in the deep white matter. No evidence of hemorrhage or extra-axial fluid. No mass, infarct, or hydrocephalus. No skull fracture. CT MAXILLOFACIAL FINDINGS There is a comminuted fracture involving the right mandibular condyles with significant displacement of 3 primary fracture fragments. The largest fragment is displaced medially and inferiorly by about 1 cm. No other facial bone fractures are  identified. Dental evaluation extremely limited due to beam attenuation artifact from implants. CT CERVICAL SPINE FINDINGS Normal alignment. No paraspinous soft tissue swelling. No fracture. Multi level degenerative disk disease. Lung apices clear. IMPRESSION: Comminuted fracture right mandibular condyle. Otherwise no acute abnormalities. Limited dental evaluation as described above. Electronically Signed   By: Skipper Cliche M.D.   On: 11/12/2015 15:54   Ct Maxillofacial Wo Cm  11/12/2015  CLINICAL DATA:  [place] several hours ago today, dizzy, fell face down and broke upper teeth with lip laceration EXAM: CT HEAD WITHOUT CONTRAST CT MAXILLOFACIAL WITHOUT CONTRAST CT CERVICAL SPINE WITHOUT CONTRAST TECHNIQUE: Multidetector CT imaging of the head, cervical spine, and maxillofacial structures were performed using the standard protocol without intravenous contrast. Multiplanar CT image reconstructions of the cervical spine and maxillofacial structures were also generated. COMPARISON:  None. FINDINGS: CT HEAD FINDINGS Age-related atrophy and low attenuation in the deep white matter. No evidence of hemorrhage or extra-axial fluid. No mass, infarct, or hydrocephalus. No skull fracture. CT MAXILLOFACIAL FINDINGS There is a comminuted fracture involving the right mandibular condyles with significant displacement of 3 primary fracture fragments. The largest fragment is displaced medially and inferiorly by about 1 cm. No other facial bone fractures are identified. Dental evaluation extremely limited due to beam attenuation artifact from implants. CT CERVICAL SPINE FINDINGS Normal alignment. No paraspinous soft tissue swelling. No fracture. Multi level degenerative disk disease. Lung apices clear. IMPRESSION: Comminuted fracture right mandibular condyle. Otherwise no acute abnormalities. Limited dental evaluation as described above. Electronically Signed   By: Skipper Cliche M.D.   On: 11/12/2015 15:54   Medications: I  have reviewed the patient's current medications. Scheduled Meds: . aspirin EC  81 mg Oral Daily  . enoxaparin (LOVENOX) injection  30 mg Subcutaneous Q24H  . feeding supplement (GLUCERNA SHAKE)  237 mL Oral TID BM  . fluticasone  1 spray Each Nare Daily  . insulin aspart  0-5 Units Subcutaneous QHS  . insulin aspart  0-9 Units Subcutaneous TID WC  . isosorbide mononitrate  45 mg Oral Daily  . lipase/protease/amylase  12,000 Units Oral TID WC  . lisinopril  5 mg Oral Daily  . metoprolol tartrate  25 mg Oral BID  . pantoprazole  40 mg Oral Daily  . pravastatin  20 mg Oral QHS  . sodium chloride  3 mL Intravenous Q12H   Continuous Infusions: . sodium chloride 75 mL/hr at 11/13/15 2243   PRN Meds:.acetaminophen, meclizine, morphine injection, nitroGLYCERIN, ondansetron **OR** ondansetron (ZOFRAN) IV, polyethylene glycol Assessment/Plan: Active Problems:   Syncope   Protein-calorie malnutrition, severe   Chronic pancreatitis ([place])   Diabetes mellitus, type II ([place])   GERD (gastroesophageal reflux disease)   Essential hypertension   Hyperlipidemia  Ms. Eckerson is an 81 yo female with HTN, HLD, DMII, chronic pancreatitis, h/o MI, and h/o breast cancer, presenting with syncope and jaw fracture.   Syncope: Patient presents with syncope while standing. She has a h/o vertigo with similar symptoms preceding the syncopal episode. She denies a history, signs, or symptoms of stroke, seizure,  or arrhythmia. CTA negative for PE. Symptoms likely due to combination of factors, including prolonged standing, orthostasis from previous N/V/D and decreased PO intake, and polypharmacy. She is on multiple medications that can cause syncope in the elderly, including Xanax, Clorazepate, Donepezil, Duloxetine, and Oxybutynin. Given her systolic ejection murmur, AS may play a role as well. Will r/o ACS, get Echo, and perform medication reconciliation. Troponins negative.  Orthostatics could not be completed  2/2 vertigo/dizziness, but recheck was negative following IVF.  Echo with mild pulmonary hypertension and EF 65-70%, but no aortic stenosis.  Cardiology recommending 30 day event monitor to r/o arhythmia.  Patient will also need outpatient ENT referral for evaluation of ear pain and vertigo. - Telemetry - Meclizine 12.5 mg BID  - Zofran 4 mg q6h PRN - HOLD Xanax, Clorazepate, Donepezil, Duloxetine, and Oxybutynin - PT/OT [ ]  Holter monitor  Mandibular Fracture: Patient has a 3 fragment mandibular fracture. Patient evaluated by OMFS with nonoperative care recommended.  Will discharge on soft diet. - Tylenol  HTN, HLD, and H/o MI: Last MI ~20 years ago. No records of CAD or CHF. Will r/o ACS and get Echo as part of syncope evaluation. - ASA - Metoprolol tartrate 25 mg BID - Lisinopril 5 mg - Imdur 45 mg  - Pravastatin 20 mg - Nitro PRN  DMII: A1c 6.8%. Patient on Glipizide-Metformin at home. - HOLD Glipizide-Metformin - SSI  Chronic Pancreatitis: - Creon - HOLD Bentyl, Eluxadoline, and Tolterodine  Allergies: Flonase and Singulair  GERD: Protonix  FEN/GI: - NPO pending surgical evaluation, then Soft  DVT Ppx: Lovenox  Dispo: Disposition is deferred at this time, awaiting improvement of current medical problems.  Anticipated discharge in approximately 2-3 day(s).   The patient does not have a current PCP (No Pcp Per Patient) and does need an [facility] hospital follow-up appointment after discharge.  The patient does not have transportation limitations that hinder transportation to clinic appointments.  .Services Needed at time of discharge: Y = Yes, Blank = No PT:   OT:   RN:   Equipment:   Other:     LOS: 2 days   Iline Oven, MD 11/14/2015, 9:01 AM

## 2015-11-14 NOTE — Progress Notes (Signed)
Patient Profile: 81 y.o. female with a history of HTN, HL, CAD s/p MI in 1999, chronic pancreatitis, chronic vertigo, chronic R ear pain and breast cancer s/p double mastectomy who admitted 11/12/2015 for evaluation of syncope.   Subjective: Doing well. Asymptomatic currently. No recurrent syncope/ near syncope.   Objective: Vital signs in last 24 hours: Temp:  [98.2 F (36.8 C)-99.1 F (37.3 C)] 98.2 F (36.8 C) (12/21 0532) Pulse Rate:  [59-76] 66 (12/21 1000) Resp:  [18-20] 18 (12/21 0532) BP: (120-136)/(49-65) 136/65 mmHg (12/21 1000) SpO2:  [97 %-99 %] 99 % (12/21 0532) Last BM Date: 11/14/15  Intake/Output from previous day: 12/20 0701 - 12/21 0700 In: -  Out: 725 [Urine:725] Intake/Output this shift:    Medications Current Facility-Administered Medications  Medication Dose Route Frequency Provider Last Rate Last Dose  . acetaminophen (TYLENOL) tablet 650 mg  650 mg Oral Q6H PRN Jones Bales, MD   650 mg at 11/14/15 0310  . aspirin EC tablet 81 mg  81 mg Oral Daily Alexa Sherral Hammers, MD   81 mg at 11/14/15 0954  . enoxaparin (LOVENOX) injection 30 mg  30 mg Subcutaneous Q24H Alexa Sherral Hammers, MD   30 mg at 11/13/15 1759  . feeding supplement (GLUCERNA SHAKE) (GLUCERNA SHAKE) liquid 237 mL  237 mL Oral TID BM Rogue Bussing, RD   237 mL at 11/13/15 1400  . fluticasone (FLONASE) 50 MCG/ACT nasal spray 1 spray  1 spray Each Nare Daily Alexa Sherral Hammers, MD   1 spray at 11/13/15 0930  . insulin aspart (novoLOG) injection 0-5 Units  0-5 Units Subcutaneous QHS Alexa Sherral Hammers, MD   0 Units at 11/12/15 2200  . insulin aspart (novoLOG) injection 0-9 Units  0-9 Units Subcutaneous TID WC Alexa Sherral Hammers, MD   2 Units at 11/14/15 0654  . isosorbide mononitrate (IMDUR) 24 hr tablet 45 mg  45 mg Oral Daily Alexa Sherral Hammers, MD   45 mg at 11/14/15 0954  . lipase/protease/amylase (CREON) capsule 12,000 Units  12,000 Units Oral TID WC Iline Oven, MD    12,000 Units at 11/14/15 [phone]  . lisinopril (PRINIVIL,ZESTRIL) tablet 5 mg  5 mg Oral Daily Alexa Sherral Hammers, MD   5 mg at 11/14/15 0954  . meclizine (ANTIVERT) tablet 12.5 mg  12.5 mg Oral BID PRN Iline Oven, MD      . metoprolol tartrate (LOPRESSOR) tablet 25 mg  25 mg Oral BID Alexa Sherral Hammers, MD   25 mg at 11/14/15 0954  . nitroGLYCERIN (NITROSTAT) SL tablet 0.4 mg  0.4 mg Sublingual Q5 min PRN Alexa Sherral Hammers, MD      . ondansetron [facility]) tablet 4 mg  4 mg Oral Q6H PRN Alexa Sherral Hammers, MD       Or  . ondansetron (ZOFRAN) injection 4 mg  4 mg Intravenous Q6H PRN Alexa Sherral Hammers, MD      . pantoprazole (PROTONIX) EC tablet 40 mg  40 mg Oral Daily Annia Belt, MD   40 mg at 11/14/15 0954  . polyethylene glycol (MIRALAX / GLYCOLAX) packet 17 g  17 g Oral Daily PRN Alexa Sherral Hammers, MD      . pravastatin (PRAVACHOL) tablet 20 mg  20 mg Oral QHS Alexa Sherral Hammers, MD   20 mg at 11/13/15 2133  . sodium chloride 0.9 % injection 3 mL  3 mL Intravenous Q12H Alexa Sherral Hammers, MD   3 mL at 11/12/15 2219  PE: General appearance: alert, cooperative and no distress Neck: no carotid bruit and no JVD Lungs: clear to auscultation bilaterally Heart: regular rate and rhythm, S1, S2 normal, no murmur, click, rub or gallop Extremities: no LEE Pulses: 2+ and symmetric Skin: warm and dry Neurologic: Grossly normal  Lab Results:   Recent Labs  11/12/15 1305 11/13/15 0508  WBC 8.1 7.8  HGB 10.7* 10.2*  HCT 32.0* 31.3*  PLT 209 214   BMET  Recent Labs  11/12/15 1305 11/13/15 0508  NA 138 138  K 4.9 4.7  CL 104 104  CO2 23 26  GLUCOSE 195* 158*  BUN 22* 16  CREATININE 1.13* 1.02*  CALCIUM 9.1 9.1   Cardiac Panel (last 3 results)  Recent Labs  11/12/15 1847 11/12/15 2324 11/13/15 0508  TROPONINI <0.03 <0.03 <0.03    Studies/Results: 2D Echo 11/13/15 Study Conclusions  - Left ventricle: The cavity size was normal. Wall thickness  was increased in a pattern of mild LVH. There was focal basal hypertrophy. Systolic function was vigorous. The estimated ejection fraction was in the range of 65% to 70%. - Mitral valve: There was mild regurgitation. - Pulmonary arteries: Systolic pressure was mildly increased. PA peak pressure: 37 mm Hg (S).   Orthostatic VS for the past 24 hrs:  BP- Lying Pulse- Lying BP- Sitting Pulse- Sitting BP- Standing at 0 minutes Pulse- Standing at 0 minutes  11/13/15 1423 - - - - 100/43 mmHg 67  11/13/15 1422 - - 96/44 mmHg 70 - -  11/13/15 1420 100/46 mmHg 67 - - - -      Assessment/Plan  Active Problems:   Syncope   Protein-calorie malnutrition, severe   Chronic pancreatitis ([facility])   Diabetes mellitus, type II ([facility])   GERD (gastroesophageal reflux disease)   Essential hypertension   Hyperlipidemia   1. Syncope: denies any further recurrence. No near syncope, CP or dyspnea. Cardiac enzymes negative x 3. She does not appear to be orthostatic based on vitals sings. 2D echo shows vigorous LV systolic function with an estimated EF of 65-70%. Mild LVH with focal basal hypertrophy was noted. No significant valve abnormalities. No pericardial effusion. telemetry shows NSR. No arrhthymias or pauses. She is stable from a cardiac standpoint. Outpatient 30 day cardiac monitor has been arranged. Our office will call patient to set up appointment for pick-up.      LOS: 2 days    Brittainy M. Rosita Fire, PA-C 11/14/2015 11:16 AM  Personally seen and examined. Agree with above. ECHO reassuring NSR on tele here Will set up with event monitor  Will sign off. Please call if ?  Candee Furbish, MD

## 2015-11-14 NOTE — Discharge Instructions (Signed)
1. Take Meclizine 12.5 mg twice daily. 2. Follow up with the oral surgeon, Dr. Buelah Manis, regarding the jaw. 3. Follow up with Cardiology, Dr. Marlou Porch, regarding the cardiac monitor. 4. Follow up with ENT doctors regarding your ear pain. 5. STOP TAKING any remaining pills of Alprazolam, Clorazepate, Donepezil, Duloxetine, Oxybutynin, Eluxadoline, or Tolterodine. DO NOT TAKE these medications.

## 2015-11-14 NOTE — Progress Notes (Signed)
CSW confirmed pt is from [place] at [facility] and plan is to return to this level of care.  No CSW involvement needing in return to [place] signing off  Domenica Reamer, [place] Social Worker [phone]

## 2015-11-14 NOTE — Evaluation (Signed)
Physical Therapy Evaluation Patient Details Name: Vanessa Gibson MRN: [id number] DOB: [birth year] Today's Date: 11/14/2015   History of Present Illness  81 year old female presents after syncope. She was in her independent living facility and had very brief dizziness and then passed out. She hit her head and face on the lamp and/or floor. Patient was waiting on her daughter arrive while eating soup while standing. States she medially felt dizzy while having been standing for a while and then passed out.  PMH includes Diabetes mellitus without complication, hypertension, chronic pancreatitis, IBS, dizziness, giddiness, hyperlipidemia, cancer, MI.  Clinical Impression  Pt admitted with/for syncope, fall, and facial fractures.  Pt presents with stability issues which hopefully will resolve soon.  Will get a RW and HHPT for home safety and follow up as needed.  Will sign off from acute PT as expect she will d/c today.    Follow Up Recommendations Home health PT;Supervision - Intermittent    Equipment Recommendations  Rolling walker with 5" wheels    Recommendations for Other Services       Precautions / Restrictions Precautions Precautions: Fall Restrictions Weight Bearing Restrictions: No      Mobility  Bed Mobility Overal bed mobility: Needs Assistance Bed Mobility: Supine to Sit     Supine to sit: Supervision Sit to supine: Modified independent (Device/Increase time)      Transfers Overall transfer level: Needs assistance Equipment used: Rolling walker (2 wheeled);None Transfers: Sit to/from Stand Sit to Stand: Supervision         General transfer comment: cues/reinforcement for hand placement  Ambulation/Gait Ambulation/Gait assistance: Supervision Ambulation Distance (Feet): 400 Feet Assistive device: Rolling walker (2 wheeled);None Gait Pattern/deviations: Step-through pattern   Gait velocity interpretation: at or above normal speed for age/gender General Gait  Details: majority of time with AD,  balance challenged and pt managed with no overt deviation or LOB  Stairs Stairs: Yes Stairs assistance: Min guard Stair Management: One rail Right;Alternating pattern;Forwards Number of Stairs: 3 General stair comments: ssafe with rail  Wheelchair Mobility    Modified Rankin (Stroke Patients Only)       Balance Overall balance assessment: Needs assistance Sitting-balance support: No upper extremity supported;Feet supported Sitting balance-Leahy Scale: Good     Standing balance support: No upper extremity supported Standing balance-Leahy Scale: Fair Standing balance comment: stood to wash hands; walked across bathroom threshold with no AD. Does seek external support at times.                  Standardized Balance Assessment Standardized Balance Assessment : Dynamic Gait Index   Dynamic Gait Index Level Surface: Normal Change in Gait Speed: Normal Gait with Horizontal Head Turns: Normal Gait with Vertical Head Turns: Normal Gait and Pivot Turn: Mild Impairment Step Over Obstacle: Mild Impairment Step Around Obstacles: Mild Impairment Steps: Mild Impairment Total Score: 20       Pertinent Vitals/Pain Pain Assessment: Faces Faces Pain Scale: Hurts little more Pain Location: back Pain Descriptors / Indicators: Discomfort;Grimacing Pain Intervention(s): Monitored during session;Repositioned    Home Living Family/patient expects to be discharged to:: Private residence Living Arrangements: Children Available Help at Discharge: Family;Available PRN/intermittently Type of Home: House       Home Layout: Two level Home Equipment: Cane - single point Additional Comments: home setup information for daughter's house; pt from independent living facility    Prior Function Level of Independence: Needs assistance   Gait / Transfers Assistance Needed: used cane sometimes  ADL's / Homemaking Assistance Needed:  assist with cleaning  and aid would drive her places        Hand Dominance        Extremity/Trunk Assessment   Upper Extremity Assessment: Defer to OT evaluation           Lower Extremity Assessment: Overall WFL for tasks assessed (proximal, hamstring weakness bil)         Communication   Communication: HOH  Cognition Arousal/Alertness: Awake/alert Behavior During Therapy: WFL for tasks assessed/performed Overall Cognitive Status: Within Functional Limits for tasks assessed                      General Comments      Exercises        Assessment/Plan    PT Assessment Patient needs continued PT services  PT Diagnosis Generalized weakness   PT Problem List Decreased strength;Decreased activity tolerance;Decreased mobility;Decreased knowledge of use of DME;Decreased knowledge of precautions;Pain  PT Treatment Interventions     PT Goals (Current goals can be found in the Care Plan section) Acute Rehab PT Goals Patient Stated Goal: I just want to be peaceful PT Goal Formulation: With patient    Frequency     Barriers to discharge        Co-evaluation               End of Session   Activity Tolerance: Patient tolerated treatment well Patient left: in chair;with call bell/phone within reach Nurse Communication: Mobility status         Time: [id number] PT Time Calculation (min) (ACUTE ONLY): 29 min   Charges:   PT Evaluation $Initial PT Evaluation Tier I: 1 Procedure PT Treatments $Gait Training: 8-22 mins   PT G Codes:        Crystale Giannattasio, Tessie Fass 11/14/2015, 11:20 AM  11/14/2015  Donnella Sham, PT [phone]  (pager)

## 2015-11-14 NOTE — Discharge Summary (Signed)
Name: Vanessa Gibson MRN: [id number] DOB: Mar 05, 1928 81 y.o. PCP: No Pcp Per Patient  Date of Admission: 11/12/2015 12:42 PM Date of Discharge: 11/14/2015 Attending Physician: Annia Belt, MD  Discharge Diagnosis: 1. Syncope 2. Mandibular Fracture   Active Problems:   Syncope   Protein-calorie malnutrition, severe   Chronic pancreatitis (HCC)   Diabetes mellitus, type II (HCC)   GERD (gastroesophageal reflux disease)   Essential hypertension   Hyperlipidemia  Discharge Medications:   Medication List    STOP taking these medications        ALPRAZolam 0.25 MG tablet  Commonly known as:  XANAX     donepezil 10 MG tablet  Commonly known as:  ARICEPT     oxybutynin 5 MG tablet  Commonly known as:  DITROPAN      TAKE these medications        acetaminophen 325 MG tablet  Commonly known as:  TYLENOL  Take 2 tablets (650 mg total) by mouth every 6 (six) hours as needed for moderate pain or headache.     aspirin EC 81 MG tablet  Take 81 mg by mouth daily.     feeding supplement (GLUCERNA SHAKE) Liqd  Take 237 mLs by mouth 3 (three) times daily between meals.     fexofenadine 180 MG tablet  Commonly known as:  ALLEGRA  Take 180 mg by mouth daily.     fluticasone 50 MCG/ACT nasal spray  Commonly known as:  FLONASE  Place 1 spray into both nostrils daily.     glipiZIDE-metformin 5-500 MG tablet  Commonly known as:  METAGLIP  Take 2 tablets by mouth 2 (two) times daily before a meal.     isosorbide mononitrate 30 MG 24 hr tablet  Commonly known as:  IMDUR  Take 30 mg by mouth daily.     lipase/protease/amylase 12000 UNITS Cpep capsule  Commonly known as:  CREON  Take 1 capsule (12,000 Units total) by mouth 3 (three) times daily with meals.     lisinopril 5 MG tablet  Commonly known as:  PRINIVIL,ZESTRIL  Take 5 mg by mouth daily.     lovastatin 20 MG tablet  Commonly known as:  MEVACOR  Take 20 mg by mouth at bedtime.     meclizine 12.5 MG tablet   Commonly known as:  ANTIVERT  Take 1 tablet (12.5 mg total) by mouth 2 (two) times daily.     metoprolol tartrate 25 MG tablet  Commonly known as:  LOPRESSOR  Take 25 mg by mouth 2 (two) times daily.     nitroGLYCERIN 0.3 MG SL tablet  Commonly known as:  NITROSTAT  Place 0.3 mg under the tongue every 5 (five) minutes as needed for chest pain.     omeprazole 40 MG capsule  Commonly known as:  PRILOSEC  Take 40 mg by mouth daily.     ondansetron 4 MG tablet  Commonly known as:  ZOFRAN  Take 4 mg by mouth every 8 (eight) hours as needed for nausea or vomiting.     PROBIOTIC FORMULA Caps  Take 1 capsule by mouth daily.        Disposition and follow-up:   Ms.Justis Yim was discharged from [facility] in Good condition.  At the hospital follow up visit please address:  1.  Medication reconciliation, mandibular fracture and pain control, continued dizziness/vertigo  2.  Labs / imaging needed at time of follow-up: none  3.  Pending labs/ test needing follow-up:  Immunofixation, IgG/IgA/IgM, anemia panel  Follow-up Appointments: Follow-up Information    Follow up with Candee Furbish, MD. Go on 12/07/2015.   Specialty:  Cardiology   Why:  3:30 pm for Cardiology appointment   Contact information:   [id number] [place] [address] [phone]       Follow up with [place],CHRISTOPHER L, DDS. Go on 12/05/2015.   Specialty:  Oral Surgery   Why:  at 4:15 pm for a follow up for your jaw fracture   Contact information:   [address] [place] [address] [phone]       Follow up with Mariea Stable, DMD On 12/11/2015.   Specialty:  Dentistry   Why:  2:45 pm for dentures   Contact information:   [address] [place] [address] [phone]       Follow up with Osa Craver, MD On 12/04/2015.   Specialty:  Internal Medicine   Why:  3:15 pm for follow and to establish care   Contact information:    [place] [address] [phone]       Follow up with [facility].   Why:  rolling walker   Contact information:   [address] [place] [address] [phone]       Discharge Instructions: Discharge Instructions    Ambulatory referral to ENT    Complete by:  As directed   Right ear pain, vertigo, and decreased hearing.     Diet - low sodium heart healthy    Complete by:  As directed      Increase activity slowly    Complete by:  As directed            Consultations: Treatment Team:  Rounding [facility], MD  Procedures Performed:  Ct Head Wo Contrast  11/12/2015  CLINICAL DATA:  [place] several hours ago today, dizzy, fell face down and broke upper teeth with lip laceration EXAM: CT HEAD WITHOUT CONTRAST CT MAXILLOFACIAL WITHOUT CONTRAST CT CERVICAL SPINE WITHOUT CONTRAST TECHNIQUE: Multidetector CT imaging of the head, cervical spine, and maxillofacial structures were performed using the standard protocol without intravenous contrast. Multiplanar CT image reconstructions of the cervical spine and maxillofacial structures were also generated. COMPARISON:  None. FINDINGS: CT HEAD FINDINGS Age-related atrophy and low attenuation in the deep white matter. No evidence of hemorrhage or extra-axial fluid. No mass, infarct, or hydrocephalus. No skull fracture. CT MAXILLOFACIAL FINDINGS There is a comminuted fracture involving the right mandibular condyles with significant displacement of 3 primary fracture fragments. The largest fragment is displaced medially and inferiorly by about 1 cm. No other facial bone fractures are identified. Dental evaluation extremely limited due to beam attenuation artifact from implants. CT CERVICAL SPINE FINDINGS Normal alignment. No paraspinous soft tissue swelling. No fracture. Multi level degenerative disk disease. Lung apices clear. IMPRESSION: Comminuted fracture right mandibular condyle. Otherwise no acute  abnormalities. Limited dental evaluation as described above. Electronically Signed   By: Skipper Cliche M.D.   On: 11/12/2015 15:54   Ct Angio Chest Pe W/cm &/or Wo Cm  11/12/2015  CLINICAL DATA:  Syncope, positive D-dimer EXAM: CT ANGIOGRAPHY CHEST WITH CONTRAST TECHNIQUE: Multidetector CT imaging of the chest was performed using the standard protocol during bolus administration of intravenous contrast. Multiplanar CT image reconstructions and MIPs were obtained to evaluate the vascular anatomy. CONTRAST:  41mL OMNIPAQUE IOHEXOL 350 MG/ML SOLN COMPARISON:  None. FINDINGS: Sagittal images of  the spine shows degenerative changes thoracolumbar spine. Images of the thoracic inlet are unremarkable. Central airways are patent. Atherosclerotic calcifications of thoracic aorta. No pulmonary embolus is noted. Images of the lung parenchyma shows no acute infiltrate or pulmonary edema. Mild dependent atelectasis noted lung bases posteriorly. Atherosclerotic calcifications of coronary arteries. No mediastinal hematoma or adenopathy. No hilar adenopathy. The visualized upper abdomen shows intrahepatic and extrahepatic biliary ductal dilatation. There is CBD dilatation up to 1.5 cm. The patient is status postcholecystectomy. There is a low-density nodule probable adenoma right adrenal gland measures 1 cm. Review of the MIP images confirms the above findings. IMPRESSION: 1. No pulmonary embolus is noted. 2. No acute infiltrate or pulmonary edema. 3. Mild atherosclerotic calcifications of thoracic aorta. Atherosclerotic calcifications of coronary arteries. 4. There is intrahepatic biliary ductal dilatation. Significant CBD dilatation up to 1.5 cm. Status postcholecystectomy. 5. Probable right adrenal adenoma measures 1 cm. Electronically Signed   By: Lahoma Crocker M.D.   On: 11/12/2015 15:41   Ct Cervical Spine Wo Contrast  11/12/2015  CLINICAL DATA:  [place] several hours ago today, dizzy, fell face down and broke upper teeth  with lip laceration EXAM: CT HEAD WITHOUT CONTRAST CT MAXILLOFACIAL WITHOUT CONTRAST CT CERVICAL SPINE WITHOUT CONTRAST TECHNIQUE: Multidetector CT imaging of the head, cervical spine, and maxillofacial structures were performed using the standard protocol without intravenous contrast. Multiplanar CT image reconstructions of the cervical spine and maxillofacial structures were also generated. COMPARISON:  None. FINDINGS: CT HEAD FINDINGS Age-related atrophy and low attenuation in the deep white matter. No evidence of hemorrhage or extra-axial fluid. No mass, infarct, or hydrocephalus. No skull fracture. CT MAXILLOFACIAL FINDINGS There is a comminuted fracture involving the right mandibular condyles with significant displacement of 3 primary fracture fragments. The largest fragment is displaced medially and inferiorly by about 1 cm. No other facial bone fractures are identified. Dental evaluation extremely limited due to beam attenuation artifact from implants. CT CERVICAL SPINE FINDINGS Normal alignment. No paraspinous soft tissue swelling. No fracture. Multi level degenerative disk disease. Lung apices clear. IMPRESSION: Comminuted fracture right mandibular condyle. Otherwise no acute abnormalities. Limited dental evaluation as described above. Electronically Signed   By: Skipper Cliche M.D.   On: 11/12/2015 15:54   Ct Maxillofacial Wo Cm  11/12/2015  CLINICAL DATA:  [place] several hours ago today, dizzy, fell face down and broke upper teeth with lip laceration EXAM: CT HEAD WITHOUT CONTRAST CT MAXILLOFACIAL WITHOUT CONTRAST CT CERVICAL SPINE WITHOUT CONTRAST TECHNIQUE: Multidetector CT imaging of the head, cervical spine, and maxillofacial structures were performed using the standard protocol without intravenous contrast. Multiplanar CT image reconstructions of the cervical spine and maxillofacial structures were also generated. COMPARISON:  None. FINDINGS: CT HEAD FINDINGS Age-related atrophy and low  attenuation in the deep white matter. No evidence of hemorrhage or extra-axial fluid. No mass, infarct, or hydrocephalus. No skull fracture. CT MAXILLOFACIAL FINDINGS There is a comminuted fracture involving the right mandibular condyles with significant displacement of 3 primary fracture fragments. The largest fragment is displaced medially and inferiorly by about 1 cm. No other facial bone fractures are identified. Dental evaluation extremely limited due to beam attenuation artifact from implants. CT CERVICAL SPINE FINDINGS Normal alignment. No paraspinous soft tissue swelling. No fracture. Multi level degenerative disk disease. Lung apices clear. IMPRESSION: Comminuted fracture right mandibular condyle. Otherwise no acute abnormalities. Limited dental evaluation as described above. Electronically Signed   By: Skipper Cliche M.D.   On: 11/12/2015 15:54    2D Echo:  Study Conclusions  - Left ventricle: The cavity size was normal. Wall thickness was increased in a pattern of mild LVH. There was focal basal hypertrophy. Systolic function was vigorous. The estimated ejection fraction was in the range of 65% to 70%. - Mitral valve: There was mild regurgitation. - Pulmonary arteries: Systolic pressure was mildly increased. PA peak pressure: 37 mm Hg (S). ------------------------------------------------------------------- Left ventricle: The cavity size was normal. Wall thickness was increased in a pattern of mild LVH. There was focal basal hypertrophy. Systolic function was vigorous. The estimated ejection fraction was in the range of 65% to 70%.  ------------------------------------------------------------------- Aortic valve:  Structurally normal valve.  Cusp separation was normal. Doppler: Transvalvular velocity was within the normal range. There was no stenosis. There was no regurgitation.  ------------------------------------------------------------------- Aorta: Aortic root:  The aortic root was normal in size. Ascending aorta: The ascending aorta was normal in size.  ------------------------------------------------------------------- Mitral valve:  Structurally normal valve.  Leaflet separation was normal. Doppler: Transvalvular velocity was within the normal range. There was no evidence for stenosis. There was mild regurgitation.  ------------------------------------------------------------------- Left atrium: The atrium was normal in size.  ------------------------------------------------------------------- Right ventricle: The cavity size was normal. Systolic function was normal.  ------------------------------------------------------------------- Pulmonic valve:  Structurally normal valve.  Cusp separation was normal. Doppler: Transvalvular velocity was within the normal range. There was no regurgitation.  ------------------------------------------------------------------- Tricuspid valve:  Structurally normal valve.  Leaflet separation was normal. Doppler: Transvalvular velocity was within the normal range. There was mild regurgitation.  ------------------------------------------------------------------- Pulmonary artery:  Systolic pressure was mildly increased.  ------------------------------------------------------------------- Right atrium: The atrium was normal in size.  ------------------------------------------------------------------- Pericardium: There was no pericardial effusion.   Cardiac Cath:   Admission HPI: Ms. Havner is an 81 yo female with HTN, HLD, DMII, chronic pancreatitis, h/o MI, and h/o breast cancer, presenting with syncope and jaw fracture. Patient reports standing and eating a bowl of soup while waiting for her to daughter to arrive to her ALF. The patient then began feeling dizzy, lightheaded, and felt as if the room were spinning. She then lost consciousness and hit her head on a lamp and the  floor. She has a h/o vertigo for which takes Meclizine several times per day. She has daily episodes of vertigo. However, she states the dizziness preceding her LOC was different from her normal episode, in that it did not resolve. She has been having intermittent right ear pain, much worse this morning prior to the syncopal episode. She has never had a syncopal episode before.  She has had poor PO intake recently. She had 3-4 days of N/V, diarrhea, decreased appetite, . She thinks this is partly due to her chronic pancreatitis as well as stress from her recent move from [place] to [place]. Family is also concerned that prior to and since the move, it is unclear what medications the patient is supposed to be taking and that she may be confusing them.  She endorses leg weakness preceding the episode, but states it is a chronic issue, unchanged from previous. She is supposed to ambulate with a walker but was not using one. She denies tongue biting or loss of bowel/bladder control. She denies a h/o seizure. She flew from [place] on Thursday (12/15). She endorses SOB surrounding the syncopal episode, but states was because she "not feeling good." D-dimer in the ED was elevated to 1.55. CTA was negative for PE.   She denies fever, chills, dysuria, continued N/V or diarrhea.   She has  a history of breast cancer of unknown subtype, diagnosed 30-40 years ago, s/p double mastectomy. She had an MI in 1999.   She has a family history of dementia in her father, and HLD and HTN in the rest of the family.  In the ED, CT demonstrated comminuted fracture of right mandibular condyles and age-related cerebral atrophy without stroke or mass.   Hospital Course by problem list: Active Problems:   Syncope   Protein-calorie malnutrition, severe   Chronic pancreatitis ([place])   Diabetes mellitus, type II ([place])   GERD (gastroesophageal reflux disease)   Essential hypertension    Hyperlipidemia   Syncope: Patient presents with syncope while standing. She has a h/o vertigo with similar symptoms preceding the syncopal episode. She denies a history, signs, or symptoms of stroke, seizure, or arrhythmia. CTA negative for PE. Symptoms likely due to combination of factors, including prolonged standing, orthostasis from previous N/V/D and decreased PO intake, and polypharmacy. She is on multiple medications that can cause syncope in the elderly, including Xanax, Clorazepate, Donepezil, Duloxetine, and Oxybutynin.  Echo demonstrated mild pulmonary hypertension and EF 65-70%, but no aortic stenosis. Cardiology recommending 30 day event monitor to r/o arhythmia, which will be placed after discharge. Medication reconciliation was performed on discharge and patient provided with ENT referral, cardiology follow up, and PCP follow up.  Mandibular Fracture: Patient sustained comminuted right mandibular fracture.  Nonoperative care was recommended by OMFS evaluation.  Soft diet and continued chewing/talking was recommended.  Follow up with OMFS and dentist for denture replacement was scheduled on discharge.  Normocytic Anemia: Normocytic anemia without past records.  Will obtain anemia panel and myeloma screen prior to discharge.  At follow up, please address these lab results.  Discharge Vitals:   BP 116/52 mmHg  Pulse 67  Temp(Src) 99 F (37.2 C) (Oral)  Resp 17  Ht 5\' 2"  (1.575 m)  Wt 96 lb 4.8 oz (43.681 kg)  BMI 17.61 kg/m2  SpO2 98%  Discharge Labs:  Results for orders placed or performed during the hospital encounter of 11/12/15 (from the past 24 hour(s))  Glucose, capillary     Status: Abnormal   Collection Time: 11/13/15  4:41 PM  Result Value Ref Range   Glucose-Capillary 137 (H) 65 - 99 mg/dL  Glucose, capillary     Status: Abnormal   Collection Time: 11/13/15  9:17 PM  Result Value Ref Range   Glucose-Capillary 180 (H) 65 - 99 mg/dL   Comment 1 Notify RN     Comment 2 Document in Chart   Glucose, capillary     Status: Abnormal   Collection Time: 11/14/15  6:31 AM  Result Value Ref Range   Glucose-Capillary 172 (H) 65 - 99 mg/dL   Comment 1 Notify RN    Comment 2 Document in Chart   Glucose, capillary     Status: Abnormal   Collection Time: 11/14/15 11:49 AM  Result Value Ref Range   Glucose-Capillary 141 (H) 65 - 99 mg/dL    Signed: Iline Oven, MD 11/14/2015, 1:15 PM    Services Ordered on Discharge: none Equipment Ordered on Discharge: none

## 2015-11-14 NOTE — Care Management Note (Addendum)
Case Management Note  Patient Details  Name: Vanessa Gibson MRN: [id number] Date of Birth: 1928/05/01  Subjective/Objective:    Pt admitted with syncopal episode                Action/Plan:  Pt is from [facility] [employer] [facility].  PT has recently moved to [place] from [place], has children in [place] [facility] that provide support when needed.  Pt evaluated pt prior to discharge,; pt is cleared to return back to [place], pt will not have recommended supervision.   Expected Discharge Date:                  Expected Discharge Plan:  Assisted Living / Rest Home (Pt is from [place])  In-House Referral:  PCP / [facility]  Discharge planning Services  CM Consult  Post Acute Care Choice:    Choice offered to:  Patient  DME Arranged:  [place] rolling DME Agency:  [place]:    [place]:     Status of Service:  Completed, signed off  Medicare Important Message Given:    Date Medicare IM Given:    Medicare IM give by:    Date Additional Medicare IM Given:    Additional Medicare Important Message give by:     If discussed at [place] of Stay Meetings, dates discussed:    Additional Comments: Pts daughter will provide transport back to community  CM assessed pt.  Pt refused HHPT as recommended, CM informed pt that she could ask PCP for [facility] once discharge if determined to need it at a later date.  Pt stated she did not have PCP in [facility] yet, CM provided [facility] Connect number and informed pt that service could help her find a doctor within [place] system that accepts her insurance.  Pt informed CM that attending has also provided her children with PCP in the area to possible establish care with.  Pt refused 3:1 but accepted RW.  PT chose [facility], agency contacted and referral was accepted. Maryclare Labrador, RN 11/14/2015, 12:18 PM

## 2015-11-15 LAB — IMMUNOFIXATION ELECTROPHORESIS
IGA: 287 mg/dL (ref 64–422)
IGM, SERUM: 59 mg/dL (ref 26–217)
IgG (Immunoglobin G), Serum: 1185 mg/dL (ref 700–1600)
Total Protein ELP: 6 g/dL (ref 6.0–8.5)

## 2015-11-15 LAB — IGG, IGA, IGM
IGA: 139 mg/dL (ref 64–422)
IGG (IMMUNOGLOBIN G), SERUM: 526 mg/dL — AB (ref 700–1600)
IgM, Serum: 32 mg/dL (ref 26–217)

## 2015-11-20 ENCOUNTER — Ambulatory Visit (INDEPENDENT_AMBULATORY_CARE_PROVIDER_SITE_OTHER): Payer: 59

## 2015-11-20 DIAGNOSIS — R55 Syncope and collapse: Secondary | ICD-10-CM

## 2015-12-04 ENCOUNTER — Encounter: Payer: Self-pay | Admitting: Internal Medicine

## 2015-12-04 ENCOUNTER — Ambulatory Visit (INDEPENDENT_AMBULATORY_CARE_PROVIDER_SITE_OTHER): Payer: 59 | Admitting: Internal Medicine

## 2015-12-04 VITALS — BP 118/47 | HR 79 | Temp 97.5°F | Ht 60.0 in | Wt 99.0 lb

## 2015-12-04 DIAGNOSIS — S02611D Fracture of condylar process of right mandible, subsequent encounter for fracture with routine healing: Secondary | ICD-10-CM

## 2015-12-04 DIAGNOSIS — S02609A Fracture of mandible, unspecified, initial encounter for closed fracture: Secondary | ICD-10-CM

## 2015-12-04 DIAGNOSIS — W19XXXD Unspecified fall, subsequent encounter: Secondary | ICD-10-CM

## 2015-12-04 DIAGNOSIS — S0269XD Fracture of mandible of other specified site, subsequent encounter for fracture with routine healing: Secondary | ICD-10-CM

## 2015-12-04 DIAGNOSIS — J069 Acute upper respiratory infection, unspecified: Secondary | ICD-10-CM | POA: Diagnosis not present

## 2015-12-04 DIAGNOSIS — Z7984 Long term (current) use of oral hypoglycemic drugs: Secondary | ICD-10-CM

## 2015-12-04 DIAGNOSIS — I10 Essential (primary) hypertension: Secondary | ICD-10-CM

## 2015-12-04 DIAGNOSIS — D649 Anemia, unspecified: Secondary | ICD-10-CM

## 2015-12-04 DIAGNOSIS — E119 Type 2 diabetes mellitus without complications: Secondary | ICD-10-CM

## 2015-12-04 DIAGNOSIS — R55 Syncope and collapse: Secondary | ICD-10-CM

## 2015-12-04 HISTORY — DX: Fracture of mandible, unspecified, initial encounter for closed fracture: S02.609A

## 2015-12-04 MED ORDER — OMEPRAZOLE 40 MG PO CPDR: 40.0000 mg | DELAYED_RELEASE_CAPSULE | Freq: Every day | ORAL | Status: DC

## 2015-12-04 MED ORDER — GUAIFENESIN ER 600 MG PO TB12: 600.0000 mg | ORAL_TABLET | Freq: Two times a day (BID) | ORAL | Status: DC

## 2015-12-04 NOTE — Patient Instructions (Addendum)
CONTINUE YOUR MEDICATIONS AS PRESCRIBED.  FOR YOUR COUGH AND CONGESTION, CONTINUE TAKING FLONASE 2 SPRAYS EACH NOSTRIL ONCE A DAY. TAKE MUCINEX ONE PILL TWICE A DAY.  PLEASE FOLLOW UP WITH YOUR DOCTORS AS LISTED, WE WILL SEND RECORDS TO YOUR NEW PRIMARY CARE DOCTOR.   THE FOLLOWING PHARMACIES DELIVER: [place]

## 2015-12-04 NOTE — Progress Notes (Signed)
Subjective:    Patient ID: Vanessa Gibson, female    DOB: 10/17/1928, 82 y.o.   MRN: [id number]  HPI Vanessa Gibson is a 82 y.o. female with PMHx of T2DM, chronic pancreatitis, GERD, HTN who presents to the clinic for follow up for her syncopal episode and mandibular fracture. Patient states she will be following up with a PCP- Dr. Marlyn Corporal at the end of the month and wants her chronic issues to be addressed by her new physician. Please see A&P for the status of the patient's chronic medical problems.   Past Medical History  Diagnosis Date  . Hypertension   . Chronic pancreatitis ([place])   . IBS (irritable bowel syndrome)   . Dizziness   . Giddiness   . Hyperlipemia breast cancer  . MI (myocardial infarction) ([place])   . Breast cancer ([place])     "both breasts"  . Skin cancer of face     "don't know what kind"  . Type II diabetes mellitus ([place])   . History of hiatal hernia   . GERD (gastroesophageal reflux disease)   . Daily headache   . Arthritis     "all over"  . Anxiety   . Depression     Outpatient Encounter Prescriptions as of 12/04/2015  Medication Sig Note  . acetaminophen (TYLENOL) 325 MG tablet Take 2 tablets (650 mg total) by mouth every 6 (six) hours as needed for moderate pain or headache.   Marland Kitchen aspirin EC 81 MG tablet Take 81 mg by mouth daily.   . feeding supplement, GLUCERNA SHAKE, (GLUCERNA SHAKE) LIQD Take 237 mLs by mouth 3 (three) times daily between meals.   . fexofenadine (ALLEGRA) 180 MG tablet Take 180 mg by mouth daily.   . fluticasone (FLONASE) 50 MCG/ACT nasal spray Place 2 sprays into both nostrils daily.   Marland Kitchen glipiZIDE-metformin (METAGLIP) 5-500 MG tablet Take 2 tablets by mouth 2 (two) times daily before a meal.   . guaiFENesin (MUCINEX) 600 MG 12 hr tablet Take 1 tablet (600 mg total) by mouth 2 (two) times daily.   . isosorbide mononitrate (IMDUR) 30 MG 24 hr tablet Take 30 mg by mouth daily.    . lipase/protease/amylase (CREON) 12000 UNITS CPEP capsule Take 1  capsule (12,000 Units total) by mouth 3 (three) times daily with meals.   Marland Kitchen lisinopril (PRINIVIL,ZESTRIL) 5 MG tablet Take 5 mg by mouth daily.   Marland Kitchen lovastatin (MEVACOR) 20 MG tablet Take 20 mg by mouth at bedtime.   . meclizine (ANTIVERT) 12.5 MG tablet Take 1 tablet (12.5 mg total) by mouth 2 (two) times daily.   . metoprolol tartrate (LOPRESSOR) 25 MG tablet Take 25 mg by mouth 2 (two) times daily.   . nitroGLYCERIN (NITROSTAT) 0.3 MG SL tablet Place 0.3 mg under the tongue every 5 (five) minutes as needed for chest pain. 11/12/2015: Verified 0.3mg   . omeprazole (PRILOSEC) 40 MG capsule Take 1 capsule (40 mg total) by mouth daily.   . ondansetron (ZOFRAN) 4 MG tablet Take 4 mg by mouth every 8 (eight) hours as needed for nausea or vomiting.   . Probiotic Product (PROBIOTIC FORMULA) CAPS Take 1 capsule by mouth daily.   . [DISCONTINUED] guaiFENesin (MUCINEX) 600 MG 12 hr tablet Take 1 tablet (600 mg total) by mouth 2 (two) times daily.   . [DISCONTINUED] omeprazole (PRILOSEC) 40 MG capsule Take 40 mg by mouth daily.    No facility-administered encounter medications on file as of 12/04/2015.   Family History: Daughter- Obesity  Social History   Social History  . Marital Status: Widowed    Spouse Name: N/A  . Number of Children: N/A  . Years of Education: N/A   Occupational History  . Not on file.   Social History Main Topics  . Smoking status: Never Smoker   . Smokeless tobacco: Never Used  . Alcohol Use: No  . Drug Use: No  . Sexual Activity: No   Other Topics Concern  . Not on file   Social History Narrative   Review of Systems General: Admits to decreased appetite. Denies fever, chills, fatigue.  Respiratory: Denies SOB, cough, DOE.   Cardiovascular: Denies chest pain and palpitations.  Gastrointestinal: Admits to nausea. Denies vomiting, abdominal pain, diarrhea, constipation Endocrine: Denies polyuria, and polydipsia. Musculoskeletal: Admits to pain in ascending ramus  of mandible. Denies myalgias, and gait problem.  Neurological: Admits to occasional dizziness. Denies headaches, weakness, numbness, seizures, and syncope. Psychiatric/Behavioral: Admits to depression, anxiety, nervousness, sleep disturbance.     Objective:   Physical Exam Filed Vitals:   12/04/15 1537  BP: 118/47  Pulse: 79  Temp: 97.5 F (36.4 C)  TempSrc: Oral  Height: 5' (1.524 m)  Weight: 99 lb (44.906 kg)  SpO2: 100%   General: Vital signs reviewed.  Patient is thin, elderly, in no acute distress and cooperative with exam.  Head: Normocephalic and atraumatic. Laceration on right side of face is well healed. Eyes: EOMI, conjunctivae normal.  Mouth: Normal opening of jaw, mild tenderness over right ascending ramus of mandible. Normal speech.  Neck: Supple, trachea midline, normal ROM.  Cardiovascular: RRR, S1 normal, S2 normal, no murmurs, gallops, or rubs. Pulmonary/Chest: Clear to auscultation bilaterally, no wheezes, rales, or rhonchi. Abdominal: Soft, non-tender, non-distended, BS +, no guarding present.  Extremities: No lower extremity edema bilaterally, pulses symmetric and intact bilaterally. No cyanosis or clubbing. Skin: Warm, dry and intact.  Psychiatric: Agitated, dismissive, frustrated. Cognition and memory are normal.      Assessment & Plan:   Please see problem based assessment and plan.

## 2015-12-05 DIAGNOSIS — D638 Anemia in other chronic diseases classified elsewhere: Secondary | ICD-10-CM | POA: Insufficient documentation

## 2015-12-05 NOTE — Assessment & Plan Note (Signed)
BP Readings from Last 3 Encounters:  12/04/15 118/47  11/14/15 116/52    Lab Results  Component Value Date   NA 138 11/13/2015   K 4.7 11/13/2015   CREATININE 1.02* 11/13/2015    Assessment: Blood pressure control:  Controlled Progress toward BP goal:   At goal Comments: Patient is supposed to be on lisinopril 5 mg and imdur 30 mg daily. She is unsure of her current medication regimen and what she is taking. Orthostatics were negative during hospitalization.   Plan: Medications:  continue current medications Educational resources provided:   Self management tools provided:   Other plans: Encouraged to bring medications to follow up appointment with her New PCP in 2 weeks to go over regimen. Patient may benefit from a decreased dose of lisinopril to 2.5 daily or complete discontinuation.

## 2015-12-05 NOTE — Assessment & Plan Note (Signed)
Patient presents for follow up after being hospitalized for a syncopal episode in December. Patient suffered a comminuted right mandibular condyle fracture as a result of her fall. She was seen by oral surgery during admission who recommended non-operative management, soft foods, chewing and talking as able. Patient states her pain is much improved, but still has some residual pain. She denies any trouble eating or talking. She has been seen by dentistry for follow up for replacement of her dentures. She has a follow up appointment with oral surgery on 12/05/15 at 4:15 pm.  Plan: -Follow up with oral surgery on 12/05/15 at 4:15 pm -Continue to follow with dentistry for replacement of dentures

## 2015-12-05 NOTE — Assessment & Plan Note (Signed)
Patient presents for follow up after being hospitalized for a syncopal episode in December. During her admission, work up was negative for CVA, ACS, PE, arrhythmia, aortic stenosis. Patient was placed on a 30 days holter monitor to more definitively rule out arrhythmia. CT head was negative for acute changes. TSH normal. Echo showed EF of 65-70% with mild LVH, no AS, and mild MR. TSH normal. No episodes of hypoglycemia. Orthostatic vital signs were negative. Syncopal episode was contributed to polypharmacy versus vertigo and poor po intake. On discharge, patient's xanax, donepezil and oxybutynin were held due to concern of polypharmacy. Patient has been taking meclizine BID for her vertiginous symptoms which she states still occur from time to time. However, patient denies any recurrent syncopal episodes.   Plan: -Continue current medication regimen -Continue holter monitor, follow up with Cardiology -Consider vestibular rehab consult versus ENT if vertigo persists

## 2015-12-05 NOTE — Assessment & Plan Note (Signed)
CBC on admission revealed a normocytic anemia with hemoglobin of 10.7, MCV 93.3. Further work up revealed iron 62, ferritin 19, folate 20.3, vitamin B12 normal, TIBC 274, and saturation ratio of 23. Immunofixation electrophoresis was obtained for consideration of multiple myeloma, but was a normal pattern. Total protein and albumin low. Normal creatinine.   Plan: -Unlikely due to MM, but can complete work up with serum free light chains and quantitative immunoglobulins

## 2015-12-05 NOTE — Progress Notes (Signed)
Internal Medicine Clinic Attending  Case discussed with Dr. Richardson at the time of the visit.  We reviewed the resident's history and exam and pertinent patient test results.  I agree with the assessment, diagnosis, and plan of care documented in the resident's note. 

## 2015-12-05 NOTE — Assessment & Plan Note (Signed)
Lab Results  Component Value Date   HGBA1C 6.8* 11/12/2015     Assessment: Diabetes control:  Controlled Progress toward A1C goal:    At goal Comments: On metformin-glipizide 1000 mg-10 mg BID  Plan: Medications:  continue current medications Home glucose monitoring: Frequency:  None Timing:  None Instruction/counseling given: reminded to bring medications to each visit Educational resources provided:   Self management tools provided:   Other plans: Will follow up with her new PCP

## 2015-12-05 NOTE — Assessment & Plan Note (Signed)
Patient complains of nasal congestion, sinus pressure, and non-productive cough for 2 days. She denies fever, chills, headache, vomiting, sore throat, diarrhea.   Assessment: Acute viral URI  Plan: -Flonase 2 sprays each nostril QD -Mucinex 600 mg BID

## 2015-12-07 ENCOUNTER — Encounter: Payer: Self-pay | Admitting: Cardiology

## 2015-12-07 ENCOUNTER — Ambulatory Visit (INDEPENDENT_AMBULATORY_CARE_PROVIDER_SITE_OTHER): Payer: Medicare PPO | Admitting: Cardiology

## 2015-12-07 VITALS — BP 138/60 | HR 94 | Ht 60.0 in | Wt 99.8 lb

## 2015-12-07 DIAGNOSIS — R05 Cough: Secondary | ICD-10-CM

## 2015-12-07 DIAGNOSIS — R059 Cough, unspecified: Secondary | ICD-10-CM

## 2015-12-07 DIAGNOSIS — I2583 Coronary atherosclerosis due to lipid rich plaque: Secondary | ICD-10-CM

## 2015-12-07 DIAGNOSIS — E43 Unspecified severe protein-calorie malnutrition: Secondary | ICD-10-CM

## 2015-12-07 DIAGNOSIS — R55 Syncope and collapse: Secondary | ICD-10-CM | POA: Diagnosis not present

## 2015-12-07 DIAGNOSIS — Z9861 Coronary angioplasty status: Secondary | ICD-10-CM

## 2015-12-07 DIAGNOSIS — I2510 Atherosclerotic heart disease of native coronary artery without angina pectoris: Secondary | ICD-10-CM

## 2015-12-07 NOTE — Patient Instructions (Signed)
Medication Instructions:  Please stop your Isosorbide. Continue all other medications as listed.  Follow-Up: Follow up as needed.  If you need a refill on your cardiac medications before your next appointment, please call your pharmacy.  Thank you for choosing [facility]!!

## 2015-12-07 NOTE — Progress Notes (Signed)
Cardiology Office Note   Date:  12/07/2015   ID:  Vanessa Gibson, DOB 1928/02/10, MRN [id number]  PCP:  No PCP Per Patient  Cardiologist:   Candee Furbish, MD       History of Present Illness: Vanessa Gibson is a 82 y.o. female who presents for hospital follow up, syncope. History of HTN, HL, CAD s/p MI in 1999 PTCA only, chronic pancreatitis, chronic vertigo, chronic R ear pain and breast cancer s/p double mastectomy who admitted 11/12/2015 for evaluation of syncope.   She was living in [place], flew here 11/11/15 so her daughter could help take care of her. She was living at independent living facility while she had a sudden episode of syncope. She was standing to eat something and pass out, no prodrome. She stuck her head and broke her jaw. Her only complaint is weakness. The patient denies nausea, vomiting, fever, chest pain, palpitations, shortness of breath, orthopnea, PND,cough, congestion, abdominal pain, hematochezia, melena, lower extremity edema.  Her d-dimer was elevated. CT angiogram shows no acute PE. Incidentally noted were atherosclerotic calcifications of the coronary arteries, significant common bile duct dilatation status post cholecystectomy and a probable 1 cm right adrenal adenoma. EKG with sinus rhythm and intermittent right bundle branch block. No prior EKG to compare. Troponin x 3 negative. Hemoglobin A1c is 6.8. TSH normal. Blood pressure is stable.  She hasn't seen any cardiologist since many years. However she is on aspirin, Imdur, lisinopril, Lopressor, and pravastatin. Likely prescribed by PCP.  My suspicion in the hospital was that she may have had polypharmacy, orthostatic hypotension as well as dizziness, perhaps dehydration. 30 day event monitor placed but she was having difficulty with it could not understand the instructions, called the company, felt dissatisfied and sent it back. She wore it for a few days.    Past Medical History  Diagnosis Date  .  Hypertension   . Chronic pancreatitis ([place])   . IBS (irritable bowel syndrome)   . Dizziness   . Giddiness   . Hyperlipemia breast cancer  . MI (myocardial infarction) ([place])   . Breast cancer ([place])     "both breasts"  . Skin cancer of face     "don't know what kind"  . Type II diabetes mellitus ([place])   . History of hiatal hernia   . GERD (gastroesophageal reflux disease)   . Daily headache   . Arthritis     "all over"  . Anxiety   . Depression     Past Surgical History  Procedure Laterality Date  . Cholecystectomy open  1970's  . Breast surgery    . Mastectomy Bilateral   . Cataract extraction w/ intraocular lens  implant, bilateral Bilateral      Current Outpatient Prescriptions  Medication Sig Dispense Refill  . acetaminophen (TYLENOL) 325 MG tablet Take 2 tablets (650 mg total) by mouth every 6 (six) hours as needed for moderate pain or headache.    Marland Kitchen aspirin EC 81 MG tablet Take 81 mg by mouth daily.    . feeding supplement, GLUCERNA SHAKE, (GLUCERNA SHAKE) LIQD Take 237 mLs by mouth 3 (three) times daily between meals.  0  . fluticasone (FLONASE) 50 MCG/ACT nasal spray Place 2 sprays into both nostrils daily.    Marland Kitchen glipiZIDE-metformin (METAGLIP) 5-500 MG tablet Take 2 tablets by mouth 2 (two) times daily before a meal.    . guaiFENesin (MUCINEX) 600 MG 12 hr tablet Take 1 tablet (600 mg total) by mouth 2 (  two) times daily. 14 tablet 0  . isosorbide mononitrate (IMDUR) 30 MG 24 hr tablet Take 30 mg by mouth daily.     . lipase/protease/amylase (CREON) 12000 UNITS CPEP capsule Take 1 capsule (12,000 Units total) by mouth 3 (three) times daily with meals. 270 capsule 0  . lisinopril (PRINIVIL,ZESTRIL) 5 MG tablet Take 5 mg by mouth daily.    Marland Kitchen lovastatin (MEVACOR) 20 MG tablet Take 20 mg by mouth at bedtime.    . meclizine (ANTIVERT) 12.5 MG tablet Take 1 tablet (12.5 mg total) by mouth 2 (two) times daily. 30 tablet 0  . metoprolol tartrate (LOPRESSOR) 25 MG tablet Take  25 mg by mouth 2 (two) times daily.    . nitroGLYCERIN (NITROSTAT) 0.3 MG SL tablet Place 0.3 mg under the tongue every 5 (five) minutes as needed for chest pain.    Marland Kitchen omeprazole (PRILOSEC) 40 MG capsule Take 1 capsule (40 mg total) by mouth daily. 30 capsule 3  . ondansetron (ZOFRAN) 4 MG tablet Take 4 mg by mouth every 8 (eight) hours as needed for nausea or vomiting.    . Probiotic Product (PROBIOTIC FORMULA) CAPS Take 1 capsule by mouth daily.     No current facility-administered medications for this visit.    Allergies:   Review of patient's allergies indicates no known allergies.    Social History:  The patient  reports that she has never smoked. She has never used smokeless tobacco. She reports that she does not drink alcohol or use illicit drugs.   Family History:  The patient's no early family history of CAD    ROS:  Please see the history of present illness.   Otherwise, review of systems are positive for chills, appetite change, excessive fatigue, chest cough, diarrhea, depression, back pain, dizziness, syncope, anxiety, joint pain, headaches.   All other systems are reviewed and negative.    PHYSICAL EXAM: VS:  BP 138/60 mmHg  Pulse 94  Ht 5' (1.524 m)  Wt 99 lb 12.8 oz (45.269 kg)  BMI 19.49 kg/m2  SpO2 98% , BMI Body mass index is 19.49 kg/(m^2). GEN: Thin, elderly, in no acute distress HEENT: normal Neck: no JVD, carotid bruits, or masses Cardiac: RRR; no murmurs, rubs, or gallops,no edema  Respiratory:  clear to auscultation bilaterally, normal work of breathing GI: soft, nontender, nondistended, + BS MS: no deformity or atrophy Skin: warm and dry, no rash Neuro:  Strength and sensation are intact Psych: full affect, somewhat depressed   EKG:  None today. See above for prior review   Recent Labs: 11/12/2015: TSH 1.097 11/13/2015: ALT 14; BUN 16; Creatinine, Ser 1.02*; Hemoglobin 10.2*; Platelets 214; Potassium 4.7; Sodium 138    Lipid Panel No results  found for: CHOL, TRIG, HDL, CHOLHDL, VLDL, LDLCALC, LDLDIRECT    Wt Readings from Last 3 Encounters:  12/07/15 99 lb 12.8 oz (45.269 kg)  12/04/15 99 lb (44.906 kg)  11/12/15 96 lb 4.8 oz (43.681 kg)    Echocardiogram: 11/13/15-normal ejection fraction reassuring  Other studies Reviewed: Additional studies/ records that were reviewed today include: Prior hospital note, EKG, labs. Review of the above records demonstrates: As above   ASSESSMENT AND PLAN:  Syncope  - No further episodes  - Unfortunately, she turned in her event monitor early because she was having difficulty with it and not getting attention by the company.  - Nevertheless, there have been no adverse cardiac arrhythmias.   Coronary artery disease  - Post PTCA in 1999  -  Has not seen cardiology in several years she states in [place]. No anginal symptoms.  - I will simplify her regimen and discontinue her isosorbide 30 mg.  - If angina develops, consider resuming.  Cough  - Basilar right-sided minor disease heard  - Feeling poorly  - Encouraged her to go to urgent care or try to call primary care doctor's office to see if they can see her earlier than projected date.  Protein malnutrition  - Encourage protein intake  Her daughter is also quite overwhelmed with the situation of taking care of her mother and states that she has been to a doctor almost every day.   Current medicines are reviewed at length with the patient today.  The patient does not have concerns regarding medicines.  The following changes have been made:  Stopping isosorbide  Labs/ tests ordered today include: none No orders of the defined types were placed in this encounter.     Disposition:   Follow-up as needed. Since she has not seen cardiology and years, I do not believe that she needs to see cardiology on a routine basis. We are here for her if necessary.  Bobby Rumpf, MD  12/07/2015 4:00 PM    [facility] [address], [place], [place]  [address] Phone: [phone]; Fax: [phone]

## 2016-02-01 ENCOUNTER — Encounter: Payer: Self-pay | Admitting: *Deleted

## 2016-02-06 DIAGNOSIS — M1990 Unspecified osteoarthritis, unspecified site: Secondary | ICD-10-CM | POA: Insufficient documentation

## 2016-02-06 DIAGNOSIS — H9193 Unspecified hearing loss, bilateral: Secondary | ICD-10-CM | POA: Insufficient documentation

## 2016-02-06 DIAGNOSIS — M810 Age-related osteoporosis without current pathological fracture: Secondary | ICD-10-CM | POA: Insufficient documentation

## 2016-10-20 ENCOUNTER — Encounter: Payer: Self-pay | Admitting: Endocrinology

## 2016-10-20 ENCOUNTER — Ambulatory Visit (INDEPENDENT_AMBULATORY_CARE_PROVIDER_SITE_OTHER): Payer: Medicare PPO | Admitting: Endocrinology

## 2016-10-20 ENCOUNTER — Other Ambulatory Visit: Payer: Self-pay

## 2016-10-20 VITALS — BP 128/70 | HR 71 | Ht 59.0 in | Wt 109.0 lb

## 2016-10-20 DIAGNOSIS — R634 Abnormal weight loss: Secondary | ICD-10-CM

## 2016-10-20 DIAGNOSIS — E1165 Type 2 diabetes mellitus with hyperglycemia: Secondary | ICD-10-CM | POA: Diagnosis not present

## 2016-10-20 MED ORDER — EMPAGLIFLOZIN-LINAGLIPTIN 10-5 MG PO TABS: 1.0000 | ORAL_TABLET | Freq: Every day | ORAL | 2 refills | Status: DC

## 2016-10-20 NOTE — Progress Notes (Signed)
Patient ID: Vanessa Gibson, female   DOB: 11/15/1928, 82 y.o.   MRN: [id number]           Reason for Appointment: Consultation for Type 2 Diabetes  Referring physician: Drema Dallas   History of Present Illness:          Date of diagnosis of type 2 diabetes mellitus: 1997        Background history:   She thinks she has been treated with metformin mostly since her diagnosis At some point was given glipizide/metformin combination detailed history is not able both from PCP Also not clear what level of control she has had until recently  Recent history:   Non-insulin hypoglycemic drugs the patient is taking are: Metformin ER 500 mg twice a day, Amaryl 2 mg daily  Current management, blood sugar patterns and problems identified:  Patient thinks that her blood sugars have been much higher in the last month or so and previously were averaging about 120 in the morning  She thinks her blood sugars have been mostly in the 250-300 range fasting, does not check readings after meals.  She does not know what brand of monitor she is using, probably Generic and does not have her meter with her or any records  Although she thinks this is related to her being in a rest home and having a poor diet she has been at this facility for about a year now.  She is unclear what meter she is using and did not bring a record of her blood sugars  She had seen her PCP in 10/17 and because of symptoms of nausea and gaseous now she was changed from [place] to Amaryl and metformin ER.  However her blood sugars at that time were reportedly over 200 at home and A1c was 8.5.  She still has decreased appetite and some nausea with this change  Lab glucose from PCP office last month was 158  Today her glucose in the office after a light lunch and the late afternoon is 301 nonfasting.  She things because of decreased appetite she is losing some weight        Side effects from medications have been: ?  Nausea from  metformin  Compliance with the medical regimen: Fairly good  Hypoglycemia:    Glucose monitoring:  done 1 times a day         Glucometer:  unknown Blood Glucose readings by recall   PREMEAL Breakfast Lunch Dinner Bedtime  Overall   Glucose range: 250-299      Median:        Self-care: The diet that the patient has been following is: tries to limit sweets     Typical meal intake: Breakfast is cereal, sometimes bacon.  At meals will get fried food at times and the choice               Dietician visit, most recent: Never               Exercise:  walking and calisthenics up to 30 minutes twice a week   Weight history: Previous range 98-140   Wt Readings from Last 3 Encounters:  10/20/16 109 lb (49.4 kg)  12/07/15 99 lb 12.8 oz (45.3 kg)  12/04/15 99 lb (44.9 kg)    Glycemic control: A1c in October = 8.5    Lab Results  Component Value Date   HGBA1C 6.8 (H) 11/12/2015   Lab Results  Component Value Date   CREATININE 1.02 (H)  11/13/2015   No results found for: [facility]       Medication List       Accurate as of 10/20/16  5:16 PM. Always use your most recent med list.          acetaminophen 325 MG tablet Commonly known as:  TYLENOL Take 2 tablets (650 mg total) by mouth every 6 (six) hours as needed for moderate pain or headache.   aspirin EC 81 MG tablet Take 81 mg by mouth daily.   buPROPion 150 MG 24 hr tablet Commonly known as:  WELLBUTRIN XL 150 mg. Take one tablet daily in the morning   feeding supplement (GLUCERNA SHAKE) Liqd Take 237 mLs by mouth 3 (three) times daily between meals.   fluticasone 50 MCG/ACT nasal spray Commonly known as:  FLONASE Place 2 sprays into both nostrils daily.   glimepiride 2 MG tablet Commonly known as:  AMARYL 2 mg. Take one tablet with breakfast or first main meal of the day   glipiZIDE-metformin 5-500 MG tablet Commonly known as:  METAGLIP Take 2 tablets by mouth 2 (two) times daily before a meal.     guaiFENesin 600 MG 12 hr tablet Commonly known as:  MUCINEX Take 1 tablet (600 mg total) by mouth 2 (two) times daily.   isosorbide mononitrate 30 MG 24 hr tablet Commonly known as:  IMDUR 30 mg. One tablet daily   lipase/protease/amylase 12000 units Cpep capsule Commonly known as:  CREON Take 1 capsule (12,000 Units total) by mouth 3 (three) times daily with meals.   lisinopril 5 MG tablet Commonly known as:  PRINIVIL,ZESTRIL Take 5 mg by mouth daily.   lisinopril 5 MG tablet Commonly known as:  PRINIVIL,ZESTRIL TAKE 1 TABLET DAILY.   lovastatin 20 MG tablet Commonly known as:  MEVACOR Take 20 mg by mouth at bedtime.   meclizine 12.5 MG tablet Commonly known as:  ANTIVERT Take 1 tablet (12.5 mg total) by mouth 2 (two) times daily.   metFORMIN 500 MG 24 hr tablet Commonly known as:  GLUCOPHAGE-XR   metoprolol tartrate 25 MG tablet Commonly known as:  LOPRESSOR Take 25 mg by mouth 2 (two) times daily.   multivitamin-lutein Caps capsule Take 1 capsule by mouth daily.   nitroGLYCERIN 0.3 MG SL tablet Commonly known as:  NITROSTAT Place 0.3 mg under the tongue every 5 (five) minutes as needed for chest pain.   omeprazole 40 MG capsule Commonly known as:  PRILOSEC Take 1 capsule (40 mg total) by mouth daily.   ondansetron 4 MG tablet Commonly known as:  ZOFRAN Take 4 mg by mouth every 8 (eight) hours as needed for nausea or vomiting.   pantoprazole 40 MG tablet Commonly known as:  PROTONIX Take 40 mg by mouth daily.   PROBIOTIC FORMULA Caps Take 1 capsule by mouth daily.       Allergies: No Known Allergies  Past Medical History:  Diagnosis Date  . Anxiety   . Arthritis    "all over"  . Breast cancer ([place])    "both breasts"  . Chronic pancreatitis ([place])   . Daily headache   . Depression   . Dizziness   . GERD (gastroesophageal reflux disease)   . Giddiness   . History of hiatal hernia   . Hyperlipemia breast cancer  . Hypertension   . IBS  (irritable bowel syndrome)   . MI (myocardial infarction)   . Skin cancer of face    "don't know what kind"  . Type  II diabetes mellitus ([place])     Past Surgical History:  Procedure Laterality Date  . BREAST SURGERY    . CATARACT EXTRACTION W/ INTRAOCULAR LENS  IMPLANT, BILATERAL Bilateral   . CHOLECYSTECTOMY OPEN  1970's  . MASTECTOMY Bilateral     No family history on file.  Social History:  reports that she has never smoked. She has never used smokeless tobacco. She reports that she does not drink alcohol or use drugs.   Review of Systems  Constitutional: Positive for weight loss and malaise.  HENT: Negative for headaches.   Respiratory: Negative for shortness of breath.   Cardiovascular: Positive for leg swelling.       Swelling mostly in her feet  Gastrointestinal: Positive for nausea.  Endocrine: Positive for fatigue and polydipsia.  Genitourinary: Positive for frequency.       Has had some cloudy urine but no burning  Musculoskeletal: Negative for muscle cramps.  Skin:       Asking about redness on her toes  Neurological: Positive for tingling.       Fingers  Psychiatric/Behavioral: Positive for insomnia.     Lipid history: Records are unavailable   No results found for: CHOL, HDL, LDLCALC, LDLDIRECT, TRIG, CHOLHDL         Hypertension: Mild and treated only with low-dose lisinopril and metoprolol  Most recent eye exam was 07/2016, reportedly no retinopathy  Most recent foot exam: 11/17   LABS: Last creatinine 1.23, TSH 1.6   Physical Examination:  BP 128/70   Pulse 71   Ht 4\' 11"  (1.499 m)   Wt 109 lb (49.4 kg)   SpO2 97%   BMI 22.02 kg/m   GENERAL:   Averagely built and nourished HEENT:         Eye exam shows normal external appearance. Fundus exam shows no retinopathy.  Oral exam shows slightly dry mucosa .  NECK:   There is no lymphadenopathy Thyroid is not enlarged and no nodules felt.  Carotids are normal to palpation and no bruit  heard LUNGS:         Chest is symmetrical. Lungs are clear to auscultation.Marland Kitchen   HEART:         Heart sounds:  S1 and S2 are normal. No murmur or click heard., no S3 or S4.   ABDOMEN:   There is no distention present. Liver and spleen are not palpable. No other mass or tenderness present.   NEUROLOGICAL:   Ankle jerks are absent bilaterally.    Diabetic Foot Exam - Simple   Simple Foot Form Diabetic Foot exam was performed with the following findings:  Yes 10/20/2016  5:14 PM  Visual Inspection See comments:  Yes Sensation Testing Intact to touch and monofilament testing bilaterally:  Yes Pulse Check Posterior Tibialis and Dorsalis pulse intact bilaterally:  Yes Comments            Vibration sense is Moderately reduced in distal first toes. MUSCULOSKELETAL:  There is no swelling or deformity of the peripheral joints.  Spine is normal to inspection.   EXTREMITIES:     There is mild pedal edema.  She has some dusky erythema of her toes mostly on dependency  No skin lesions present.Marland Kitchen SKIN:       No rash or lesions of concern.        ASSESSMENT:  Diabetes type 2, uncontrolled, nonobese She has had long-standing diabetes treated mostly with metformin and subsequently glipizide added She has had some nonspecific GI  symptoms, unclear but this is related to metformin as she had previously tolerated metformin well She appears symptomatic from her hyperglycemia with weight loss, increased thirst and fatigue Blood sugars are mostly in the 250-300 range although she is checking readings only fasting at home   Discussed that currently with her regimen of metformin and Amaryl her blood sugars are poorly controlled and need more aggressive treatment. Regardless of her age she does appear to have insulin deficiency especially considering her BMI of only 22 and continued weight loss   Complications of diabetes: Mild neuropathy on exam, unknown status of urine microalbumin  History of?  Mild  hypertension  Unknown lipid status  PLAN:    She will need to start on at least basal insulin to help control her significant hyperglycemia and to treat glucose toxicity.  Although she is very reluctant to start insulin showed her how an insulin pen would work and she will discuss this further with the nurse educator.  She most likely can start with about 8 units of basal insulin such as [name] once a day  Meanwhile since she is likely not tolerating metformin with continued GI side effects she will stop this  Start a combination of Jardiance and linagliptin, for convenience will use Glyxambi 10/5  Discussed action of SGLT 2 drugs on lowering glucose by decreasing kidney absorption of glucose, benefits of weight loss and lower blood pressure, possible side effects including candidiasis and dosage regimen   While starting this she will stop lisinopril as blood pressure will be lower  She will need short-term follow-up in about 3 weeks  She will start checking blood sugars either fasting or postprandial by rotation and bring her home record and monitor for review next time.  Consider switching her to a brand name glucose monitor  Consultation with dietitian    Patient Instructions  Check blood sugars on waking up    Also check blood sugars about 2 hours after a meal and do this after different meals by rotation  Recommended blood sugar levels on waking up is 90-130 and about 2 hours after meal is 130-160  Please bring your blood sugar monitor to each visit, thank you  Stop metformin Stop lisinopril   Total visit time including counseling = 60 minutes  Consultation note has been sent to the referring physician  [facility] 10/20/2016, 5:16 PM   Note: This office note was prepared with Dragon voice recognition system technology. Any transcriptional errors that result from this process are unintentional.

## 2016-10-20 NOTE — Patient Instructions (Addendum)
Check blood sugars on waking up  3 times a week  Also check blood sugars about 2 hours after a meal and do this after different meals by rotation  Recommended blood sugar levels on waking up is 90-130 and about 2 hours after meal is 130-160  Please bring your blood sugar monitor to each visit, thank you  Stop metformin Stop lisinopril  Continue glimepiride

## 2016-10-23 ENCOUNTER — Other Ambulatory Visit: Payer: Self-pay | Admitting: Endocrinology

## 2016-10-31 ENCOUNTER — Telehealth: Payer: Self-pay | Admitting: Internal Medicine

## 2016-10-31 NOTE — Telephone Encounter (Signed)
-----   Message from Elayne Snare, MD sent at 10/20/2016  5:27 PM EST ----- Regarding: Follow-up needed I need to see her in 3 weeks, preferably with labs Also needs to see Vaughan Basta as soon as possible to start insulin I finished seeing her today at 5:30 PM

## 2016-10-31 NOTE — Telephone Encounter (Signed)
Pt is scheduled with Vaughan Basta, attempted to call pt to get her scheduled with you had to leave a VM

## 2016-11-06 ENCOUNTER — Ambulatory Visit: Payer: Medicare PPO | Admitting: Endocrinology

## 2016-11-07 ENCOUNTER — Ambulatory Visit: Payer: Medicare PPO | Admitting: Endocrinology

## 2016-11-12 ENCOUNTER — Telehealth: Payer: Self-pay | Admitting: Nutrition

## 2016-11-12 NOTE — Telephone Encounter (Signed)
Message left on answering machine to call to schedule an appt. With me to learn about insulin injections.

## 2016-11-14 ENCOUNTER — Encounter: Payer: Self-pay | Admitting: Endocrinology

## 2016-11-14 ENCOUNTER — Ambulatory Visit (INDEPENDENT_AMBULATORY_CARE_PROVIDER_SITE_OTHER): Payer: Medicare PPO | Admitting: Endocrinology

## 2016-11-14 VITALS — BP 110/58 | HR 78 | Temp 98.3°F | Resp 16 | Ht 59.0 in | Wt 105.6 lb

## 2016-11-14 DIAGNOSIS — E119 Type 2 diabetes mellitus without complications: Secondary | ICD-10-CM

## 2016-11-14 DIAGNOSIS — E1165 Type 2 diabetes mellitus with hyperglycemia: Secondary | ICD-10-CM | POA: Diagnosis not present

## 2016-11-14 LAB — URINALYSIS, ROUTINE W REFLEX MICROSCOPIC
BILIRUBIN URINE: NEGATIVE
Hgb urine dipstick: NEGATIVE
Ketones, ur: NEGATIVE
LEUKOCYTES UA: NEGATIVE
NITRITE: NEGATIVE
RBC / HPF: NONE SEEN (ref 0–?)
Specific Gravity, Urine: <=1.005 — AB (ref 1.000–1.030)
Total Protein, Urine: NEGATIVE
Urine Glucose: >=1000 — AB
Urobilinogen, UA: 0.2 (ref 0.0–1.0)
pH: 5 (ref 5.0–8.0)

## 2016-11-14 LAB — BASIC METABOLIC PANEL
BUN: 39 mg/dL — AB (ref 6–23)
CO2: 28 meq/L (ref 19–32)
Calcium: 10 mg/dL (ref 8.4–10.5)
Chloride: 100 mEq/L (ref 96–112)
Creatinine, Ser: 1.46 mg/dL — ABNORMAL HIGH (ref 0.40–1.20)
GFR: 36.1 mL/min — ABNORMAL LOW (ref >60.00)
GLUCOSE: 332 mg/dL — AB (ref 70–99)
POTASSIUM: 5.3 meq/L — AB (ref 3.5–5.1)
SODIUM: 137 meq/L (ref 135–145)

## 2016-11-14 LAB — MICROALBUMIN / CREATININE URINE RATIO
Creatinine,U: 38.9 mg/dL
Microalb Creat Ratio: 1.8 mg/g (ref 0.0–30.0)
Microalb, Ur: <0.7 mg/dL (ref 0.0–1.9)

## 2016-11-14 NOTE — Progress Notes (Signed)
Patient ID: Vanessa Gibson, female   DOB: 1928-02-19, 82 y.o.   MRN: [id number]           Reason for Appointment: Follow-up for Type 2 Diabetes  Referring physician: Drema Dallas   History of Present Illness:          Date of diagnosis of type 2 diabetes mellitus: 1997        Background history:   She thinks she has been treated with metformin mostly since her diagnosis At some point was given glipizide/metformin combination detailed history is not able both from PCP Also not clear what level of control she has had until recently  Recent history:   Non-insulin hypoglycemic drugs the patient is taking are: Glyxami 5/10, Amaryl 2 mg daily  Her A1c in October was 8.5 before her initial consultation  Current management, blood sugar patterns and problems identified:  She was asked to schedule with the nurse educator to start basal insulin but she did not do so  She was also told to call back about which brand of meter she is using and did not do so  She has not brought her monitor for download  She was asked to start checking blood sugars after meals instead of just in the morning but she is still doing them in the morning  Appears that she did not read her instructions on her last visit  Blood sugars are still mostly in the low 200s fasting reportedly  She has taken her Glyxambi along with Amaryl, metformin was stopped because of GI intolerance  However she stated that she still may get nauseated at times and not clear if she was having GI side effects from metformin or not  Her weight has gone down slightly but this is expected with her starting Jardiance 10 mg  No lightheadedness or symptoms of Candida infection  Difficult to get a good history because of her memory issues  Has not made any changes to her diet        Side effects from medications have been: ?  Nausea from metformin  Compliance with the medical regimen: Fairly good  Hypoglycemia:    Glucose monitoring:   done 1 times a day         Glucometer:  probably Accu-Chek Blood Glucose readings by recall: Fasting readings about 220-250   Self-care: The diet that the patient has been following is: tries to limit sweets     Typical meal intake: Breakfast is cereal, sometimes bacon.  At meals will get fried food at times            Dietician visit, most recent: Never               Exercise:  walking and calisthenics up to 30 minutes twice a week   Weight history: Previous range 98-140   Wt Readings from Last 3 Encounters:  11/14/16 105 lb 9.6 oz (47.9 kg)  10/20/16 109 lb (49.4 kg)  12/07/15 99 lb 12.8 oz (45.3 kg)    Glycemic control: A1c in October = 8.5    Lab Results  Component Value Date   HGBA1C 6.8 (H) 11/12/2015   Lab Results  Component Value Date   MICROALBUR <0.7 11/14/2016   CREATININE 1.46 (H) 11/14/2016   Lab Results  Component Value Date   MICRALBCREAT 1.8 11/14/2016       Allergies as of 11/14/2016   No Known Allergies     Medication List  Accurate as of 11/14/16 11:59 PM. Always use your most recent med list.          acetaminophen 325 MG tablet Commonly known as:  TYLENOL Take 2 tablets (650 mg total) by mouth every 6 (six) hours as needed for moderate pain or headache.   aspirin EC 81 MG tablet Take 81 mg by mouth daily.   buPROPion 150 MG 24 hr tablet Commonly known as:  WELLBUTRIN XL 150 mg. Take one tablet daily in the morning   Empagliflozin-Linagliptin 10-5 MG Tabs Commonly known as:  GLYXAMBI Take 1 tablet by mouth daily before breakfast.   feeding supplement (GLUCERNA SHAKE) Liqd Take 237 mLs by mouth 3 (three) times daily between meals.   fluticasone 50 MCG/ACT nasal spray Commonly known as:  FLONASE Place 2 sprays into both nostrils daily.   glimepiride 2 MG tablet Commonly known as:  AMARYL TAKE 1 TABLET WITH BREAKFAST OR FIRST MAIN MEAL OF THE DAY.   guaiFENesin 600 MG 12 hr tablet Commonly known as:  MUCINEX Take 1  tablet (600 mg total) by mouth 2 (two) times daily.   isosorbide mononitrate 30 MG 24 hr tablet Commonly known as:  IMDUR 30 mg. One tablet daily   lisinopril 5 MG tablet Commonly known as:  PRINIVIL,ZESTRIL TAKE 1 TABLET DAILY.   lovastatin 20 MG tablet Commonly known as:  MEVACOR Take 20 mg by mouth at bedtime.   meclizine 12.5 MG tablet Commonly known as:  ANTIVERT Take 1 tablet (12.5 mg total) by mouth 2 (two) times daily.   metoprolol tartrate 25 MG tablet Commonly known as:  LOPRESSOR Take 25 mg by mouth 2 (two) times daily.   multivitamin-lutein Caps capsule Take 1 capsule by mouth daily.   CENTRUM SILVER 50+MEN Tabs Take 1 tablet by mouth daily.   nitroGLYCERIN 0.3 MG SL tablet Commonly known as:  NITROSTAT Place 0.3 mg under the tongue every 5 (five) minutes as needed for chest pain.   pantoprazole 40 MG tablet Commonly known as:  PROTONIX Take 40 mg by mouth daily.   PROBIOTIC FORMULA Caps Take 1 capsule by mouth daily.   Turmeric Curcumin 500 MG Caps Take 1 capsule by mouth daily.   vitamin C 1000 MG tablet Take 1,000 mg by mouth daily.       Allergies: No Known Allergies  Past Medical History:  Diagnosis Date  . Anxiety   . Arthritis    "all over"  . Breast cancer ([place])    "both breasts"  . Chronic pancreatitis ([place])   . Daily headache   . Depression   . Dizziness   . GERD (gastroesophageal reflux disease)   . Giddiness   . History of hiatal hernia   . Hyperlipemia breast cancer  . Hypertension   . IBS (irritable bowel syndrome)   . MI (myocardial infarction)   . Skin cancer of face    "don't know what kind"  . Type II diabetes mellitus ([place])     Past Surgical History:  Procedure Laterality Date  . BREAST SURGERY    . CATARACT EXTRACTION W/ INTRAOCULAR LENS  IMPLANT, BILATERAL Bilateral   . CHOLECYSTECTOMY OPEN  1970's  . MASTECTOMY Bilateral     No family history on file.  Social History:  reports that she has never  smoked. She has never used smokeless tobacco. She reports that she does not drink alcohol or use drugs.   Review of Systems   Lipid history: Records are unavailable   No  results found for: CHOL, HDL, LDLCALC, LDLDIRECT, TRIG, CHOLHDL         Hypertension: Mild and blood pressure is quite normal today, lisinopril was stopped because of starting Jardiance Still on metoprolol  Most recent eye exam was 07/2016, reportedly no retinopathy  Most recent foot exam: 11/17   LABS: Last creatinine 1.23, TSH 1.6   Physical Examination:  BP (!) 110/58   Pulse 78   Temp 98.3 F (36.8 C) (Oral)   Resp 16   Ht 4\' 11"  (1.499 m)   Wt 105 lb 9.6 oz (47.9 kg)   BMI 21.33 kg/m       Repeat standing blood pressure 124/64    ASSESSMENT:  Diabetes type 2, uncontrolled, nonobese  She has had only minimal improvement in her glucose control with using Glyxambi along with Amaryl Discussed need to start on insulin because of her low weight and likely progressive insulin deficiency As discussed above day-to-day management was discussed and her daughter was present today She was given explanation of what medications she is taking and how they work  Unknown lipid status, will need to get reports from previous physicians  PLAN:    She will need to start on basal insulin to help control her blood sugars better  Emphasized the need to start checking blood sugars alternating fasting and after meals rather than just morning  Continue Glyxami unchanged, reassured her that it does not cause constipation  Recheck renal panel today  Instructions for monitoring reviewed and emphasized the need to bring monitor on each visit  Consultation with diabetes educator to start [place] insulin starting with 6 units  and also review basic diabetes management and diet  Follow-up in 3 weeks  Patient Instructions  Check blood sugars on waking up  3x per week  Also check blood sugars about 2 hours after a  meal and do this after different meals by rotation  Recommended blood sugar levels on waking up is 90-130 and about 2 hours after meal is 130-160  Please bring your blood sugar monitor to each visit, thank you    Counseling time on subjects discussed above is over 50% of today's 25 minute visit    Amadeo Coke 11/15/2016, 10:26 AM   Note: This office note was prepared with Dragon voice recognition system technology. Any transcriptional errors that result from this process are unintentional.  Addendum: Labs forwarded to PCP, not clear why her potassium is high Glucose 332  Office Visit on 11/14/2016  Component Date Value Ref Range Status  . Fructosamine 11/15/2016 451* 0 - 285 umol/L Final   Comment: Published reference interval for apparently healthy subjects between age 37 and 49 is 73 - 285 umol/L and in a poorly controlled diabetic population is 228 - 563 umol/L with a mean of 396 umol/L.   Marland Kitchen Microalb, Ur 11/14/2016 <0.7  0.0 - 1.9 mg/dL Final  . Creatinine,U 11/14/2016 38.9  mg/dL Final  . Microalb Creat Ratio 11/14/2016 1.8  0.0 - 30.0 mg/g Final  . Sodium 11/14/2016 137  135 - 145 mEq/L Final  . Potassium 11/14/2016 5.3* 3.5 - 5.1 mEq/L Final  . Chloride 11/14/2016 100  96 - 112 mEq/L Final  . CO2 11/14/2016 28  19 - 32 mEq/L Final  . Glucose, Bld 11/14/2016 332* 70 - 99 mg/dL Final  . BUN 11/14/2016 39* 6 - 23 mg/dL Final  . Creatinine, Ser 11/14/2016 1.46* 0.40 - 1.20 mg/dL Final  . Calcium 11/14/2016 10.0  8.4 - 10.5  mg/dL Final  . GFR 11/14/2016 36.10* >60.00 mL/min Final  . Color, Urine 11/14/2016 YELLOW  Yellow;Lt. Yellow Final  . APPearance 11/14/2016 CLEAR  Clear Final  . Specific Gravity, Urine 11/14/2016 <=1.005* 1.000 - 1.030 Final  . pH 11/14/2016 5.0  5.0 - 8.0 Final  . Total Protein, Urine 11/14/2016 NEGATIVE  Negative Final  . Urine Glucose 11/14/2016 >=1000* Negative Final  . Ketones, ur 11/14/2016 NEGATIVE  Negative Final  . Bilirubin Urine  11/14/2016 NEGATIVE  Negative Final  . Hgb urine dipstick 11/14/2016 NEGATIVE  Negative Final  . Urobilinogen, UA 11/14/2016 0.2  0.0 - 1.0 Final  . Leukocytes, UA 11/14/2016 NEGATIVE  Negative Final  . Nitrite 11/14/2016 NEGATIVE  Negative Final  . WBC, UA 11/14/2016 0-2/hpf  0-2/hpf Final  . RBC / HPF 11/14/2016 none seen  0-2/hpf Final  . Squamous Epithelial / LPF 11/14/2016 Rare(0-4/hpf)  Rare(0-4/hpf) Final

## 2016-11-14 NOTE — Patient Instructions (Signed)
Check blood sugars on waking up  3x per week  Also check blood sugars about 2 hours after a meal and do this after different meals by rotation  Recommended blood sugar levels on waking up is 90-130 and about 2 hours after meal is 130-160  Please bring your blood sugar monitor to each visit, thank you  

## 2016-11-15 ENCOUNTER — Other Ambulatory Visit: Payer: Self-pay | Admitting: Endocrinology

## 2016-11-15 LAB — FRUCTOSAMINE: Fructosamine: 451 umol/L — ABNORMAL HIGH (ref 0–285)

## 2016-11-21 NOTE — Telephone Encounter (Signed)
Pt has been constipated for over a week and is really having a hard time. She is taking miralax with no success.  She is concerned it could be either the glimepiride or the glyxambi causing the constipation, could that be.

## 2016-11-21 NOTE — Telephone Encounter (Signed)
It could be the [facility], which contains a medication that makes her eliminating glucose through urine. This can cause dehydration. I would suggest to either increase her water intake or, if she prefers, we can stop Glyxambi, and only send Linagliptin instead (get rid of Empagliflozin part of Glyxambi).

## 2016-11-21 NOTE — Telephone Encounter (Signed)
Pt is aware of the suggestion and is asking for the Linagliptin to be called into [facility] please asap

## 2016-11-21 NOTE — Telephone Encounter (Signed)
Called in rx to Linagliptin to [facility]... Waiting on paperwork for PA and to see which insurance discount program will offer a cheaper option to choose since it is not cover under her medicare plan.

## 2016-11-25 ENCOUNTER — Encounter: Payer: Medicare PPO | Admitting: Nutrition

## 2016-11-25 ENCOUNTER — Telehealth: Payer: Self-pay | Admitting: Endocrinology

## 2016-11-25 NOTE — Telephone Encounter (Signed)
Pt called in and said that every since she started taking the Glyxambi and the Glimepiride that she has become very constipated and that it is very uncomfortable.  She wants to know if this is a possible side effect and what she needs to do to help.

## 2016-11-26 ENCOUNTER — Encounter: Payer: Medicare PPO | Admitting: Nutrition

## 2016-11-28 ENCOUNTER — Other Ambulatory Visit: Payer: Self-pay

## 2016-11-28 MED ORDER — SITAGLIPTIN PHOSPHATE 100 MG PO TABS: 100.0000 mg | ORAL_TABLET | Freq: Every day | ORAL | 0 refills | Status: DC

## 2016-12-02 NOTE — Telephone Encounter (Signed)
She can try stopping the [facility], if this helps constipation then let us know.  She will be starting insulin on the 15th, needs to keep that appointment

## 2016-12-02 NOTE — Telephone Encounter (Signed)
Left a voice mail with instructions and asked for a call back if she had any questions

## 2016-12-04 ENCOUNTER — Ambulatory Visit: Payer: Medicare PPO | Admitting: Endocrinology

## 2016-12-08 ENCOUNTER — Other Ambulatory Visit: Payer: Self-pay

## 2016-12-08 ENCOUNTER — Encounter: Payer: Medicare PPO | Attending: Endocrinology | Admitting: Nutrition

## 2016-12-08 DIAGNOSIS — Z713 Dietary counseling and surveillance: Secondary | ICD-10-CM | POA: Diagnosis not present

## 2016-12-08 DIAGNOSIS — E118 Type 2 diabetes mellitus with unspecified complications: Secondary | ICD-10-CM

## 2016-12-08 DIAGNOSIS — E1165 Type 2 diabetes mellitus with hyperglycemia: Secondary | ICD-10-CM | POA: Diagnosis not present

## 2016-12-08 MED ORDER — INSULIN DEGLUDEC 100 UNIT/ML ~~LOC~~ SOPN: PEN_INJECTOR | SUBCUTANEOUS | 1 refills | Status: DC

## 2016-12-08 MED ORDER — INSULIN PEN NEEDLE 32G X 4 MM MISC: 5 refills | Status: DC

## 2016-12-09 ENCOUNTER — Telehealth: Payer: Self-pay | Admitting: Nutrition

## 2016-12-09 NOTE — Progress Notes (Signed)
Patient is here with her daughter to learn how to inject the Tresiba insulin. They were shown how to prime the new pen, and how to attach a needle, dial 6units and inject once daily.   We discussed how to insulin works and how/where to inject this insulin.  She promised to pick the insulin up at the pharmacy tonight and to start in the AM.  I will call her to see how she did.   She did not bring her meter with her today.  Says she is testing acB, and it has come "way down", and today it was only 164.   Meals are not balance--eating cereal, and or bread for breakfast.  Gave other options for her for this meal.   She will snack between meals, and we discussed why this is not good.  She will make sure she has protein with each meal and limit carbs to 2 servings at breakfast and lunch and 3 at supper/and or HS snack Tresiba starter kit was given with nano needles, and list of carb servings, as well as protein and fat servings.    We also discussed low blood sugars--symptoms and treatment.  She had no final questions. Suggested she test her blood sugars fasting and again 2 hours after one meal each day-varying the meal each day.  She reported good understanding of this.

## 2016-12-09 NOTE — Telephone Encounter (Signed)
Patient ask you to give her call.

## 2016-12-09 NOTE — Telephone Encounter (Signed)
Pt. Reports that she can not afford $ 386.00.  She refused the insulin.   She called [facility], and they said they have a similar insulin called Relion.  ??

## 2016-12-09 NOTE — Telephone Encounter (Signed)
She can use the 70/30 insulin from [facility], take 5 units before breakfast and supper

## 2016-12-09 NOTE — Patient Instructions (Signed)
Take 6u of Vanessa Gibson once a day Test blood sugars before breakfast, and 2hr. After one meal each day

## 2016-12-10 ENCOUNTER — Ambulatory Visit: Payer: Medicare PPO | Admitting: Endocrinology

## 2016-12-12 ENCOUNTER — Telehealth: Payer: Self-pay | Admitting: Endocrinology

## 2016-12-12 NOTE — Telephone Encounter (Signed)
Left message for Mr. Vanessa Gibson to call me back

## 2016-12-12 NOTE — Telephone Encounter (Signed)
Mr.Jolly from patient insurance co. ask if there's an alternative for the tier 3 insulin. (She did not know the name of insulin) She can't afford it, but  Relion is inexpensive she can pay cash for that . Please advise [phone]

## 2016-12-15 ENCOUNTER — Telehealth: Payer: Self-pay | Admitting: Endocrinology

## 2016-12-15 NOTE — Telephone Encounter (Signed)
Message left on machine of below.

## 2016-12-15 NOTE — Telephone Encounter (Signed)
please call pat

## 2016-12-15 NOTE — Telephone Encounter (Signed)
Patient son [name] is asking how do patient take her Novalog, vials and pens. Please advise  [phone] or [phone]

## 2016-12-15 NOTE — Telephone Encounter (Signed)
Pt called and needs her Vanessa Gibson insulin resubmitted to the [facility] on [address].

## 2016-12-16 ENCOUNTER — Other Ambulatory Visit: Payer: Self-pay

## 2016-12-16 DIAGNOSIS — R351 Nocturia: Secondary | ICD-10-CM | POA: Diagnosis not present

## 2016-12-16 DIAGNOSIS — N3281 Overactive bladder: Secondary | ICD-10-CM | POA: Diagnosis not present

## 2016-12-16 MED ORDER — INSULIN DEGLUDEC 100 UNIT/ML ~~LOC~~ SOPN: PEN_INJECTOR | SUBCUTANEOUS | 1 refills | Status: DC

## 2016-12-16 NOTE — Telephone Encounter (Signed)
Prescription for tresiba sent to pharmacy again today called and left a voice mail requesting a call back if they had any questions

## 2016-12-16 NOTE — Telephone Encounter (Signed)
See previous telephone encounter.

## 2016-12-16 NOTE — Telephone Encounter (Signed)
Phone Call from patient that no prescription was called into [facility].  She is wanting the cheaper "Relion insulin" called into the [facility] on [address].

## 2016-12-16 NOTE — Telephone Encounter (Signed)
Please talk to her son and find out what insulin she has already and if she needs another prescription

## 2016-12-16 NOTE — Telephone Encounter (Signed)
Please clarify what the patient and her family want.  It may be okay to use the [facility] brand insulin

## 2016-12-16 NOTE — Telephone Encounter (Signed)
The last instructions were: Take Relion 70/30 insulin from [facility], take 5 units before breakfast and supper.  Please confirm what insulin she has

## 2016-12-16 NOTE — Telephone Encounter (Signed)
Patient son stated it was ok to send medication [place]  On [address]

## 2016-12-16 NOTE — Telephone Encounter (Signed)
Tyler Aas was ordered 12/08/16 so I am not sure what the question is

## 2016-12-29 ENCOUNTER — Ambulatory Visit: Payer: Medicare PPO | Admitting: Endocrinology

## 2016-12-30 NOTE — Telephone Encounter (Signed)
Patient stated the medication Rolla Plate is not working.  Please advise

## 2016-12-31 NOTE — Telephone Encounter (Signed)
She needs to be scheduled for follow-up, she has not made an appointment

## 2016-12-31 NOTE — Telephone Encounter (Signed)
Unable to leave message

## 2016-12-31 NOTE — Telephone Encounter (Signed)
Please advise, thanks.

## 2017-01-01 NOTE — Telephone Encounter (Signed)
Noted  

## 2017-01-01 NOTE — Telephone Encounter (Signed)
We need to look at some sugars at bedtime too; please set up f/u appt to discuss

## 2017-01-01 NOTE — Telephone Encounter (Signed)
The blood sugars are fluctuating a lot with the tresiba, she is asking if this is normal for the beginning of taking the tresiba  Readings  2/6 AM 167 2/7 AM 217 2/8 AM 244

## 2017-01-01 NOTE — Telephone Encounter (Signed)
Last night her BS 2/7 pm 240 1/16pm 205 Range from 190-250 She wants to know if this normal....? She is scheduling an appt to come in to discuss

## 2017-01-05 ENCOUNTER — Encounter: Payer: Self-pay | Admitting: Endocrinology

## 2017-01-05 ENCOUNTER — Ambulatory Visit (INDEPENDENT_AMBULATORY_CARE_PROVIDER_SITE_OTHER): Payer: Medicare HMO | Admitting: Endocrinology

## 2017-01-05 ENCOUNTER — Other Ambulatory Visit: Payer: Self-pay

## 2017-01-05 ENCOUNTER — Telehealth: Payer: Self-pay | Admitting: Endocrinology

## 2017-01-05 VITALS — BP 128/60 | HR 78 | Resp 16 | Wt 106.0 lb

## 2017-01-05 DIAGNOSIS — E1165 Type 2 diabetes mellitus with hyperglycemia: Secondary | ICD-10-CM

## 2017-01-05 DIAGNOSIS — E875 Hyperkalemia: Secondary | ICD-10-CM | POA: Diagnosis not present

## 2017-01-05 LAB — BASIC METABOLIC PANEL
BUN: 35 mg/dL — ABNORMAL HIGH (ref 6–23)
CHLORIDE: 98 meq/L (ref 96–112)
CO2: 30 mEq/L (ref 19–32)
CREATININE: 1.12 mg/dL (ref 0.40–1.20)
Calcium: 10.2 mg/dL (ref 8.4–10.5)
GFR: 49 mL/min — AB (ref >60.00)
Glucose, Bld: 299 mg/dL — ABNORMAL HIGH (ref 70–99)
Potassium: 5.1 mEq/L (ref 3.5–5.1)
Sodium: 132 mEq/L — ABNORMAL LOW (ref 135–145)

## 2017-01-05 MED ORDER — GLUCOSE BLOOD VI STRP: ORAL_STRIP | 5 refills | Status: DC

## 2017-01-05 NOTE — Patient Instructions (Addendum)
Increase insulin to 10 units daily  Check blood sugars on waking up  3-4 x weekly  Also check blood sugars about 2 hours after a meal and do this after different meals by rotation  Recommended blood sugar levels on waking up is 90-130 and about 2 hours after meal is 130-160  Please bring your blood sugar monitor to each visit, thank you  Restart Januvia  Clarify if you had constipation from Glyxambi  need to avoid foods like bananas, citrus fruits, orange juice and baked potatoes,

## 2017-01-05 NOTE — Progress Notes (Signed)
Patient ID: Vanessa Gibson, female   DOB: 01-06-1928, 83 y.o.   MRN: [id number]           Reason for Appointment: Follow-up for Type 2 Diabetes  Referring physician: Drema Dallas   History of Present Illness:          Date of diagnosis of type 2 diabetes mellitus: 1997        Background history:   She thinks she has been treated with metformin mostly since her diagnosis At some point was given glipizide/metformin combination detailed history is not able both from PCP Also not clear what level of control she has had until recently  Recent history:   Non-insulin hypoglycemic drugs the patient is taking are: None INSULIN dose: Tresiba 6 units daily  Her A1c in October was 8.5 before her initial consultation  Current management, blood sugar patterns and problems identified:  She has not been seen in follow-up since December  Although she was recommended insulin on her initial visit this has been delayed several times partly because of her not making an appointment and also she and her family not agreeing what insulin she can afford  She was recommended premixed insulin but she wondered of once a day insulin and is taking [name], learning about the cost  However she started this only about a week or 10 days ago and she thinks she can do it herself without any difficulty  She had card about her blood sugars running persistently high  ORAL medications: She does not think she is taking anyone though she was supposed to start [place]; she thinks she had severe constipation with Glyxambi although did not report this on her last visit and also has a chronic history of constipation  Difficult to get a good history because of her memory issues  Has not kept a record of her blood sugars and continues to use a Generic monitor sent by her insurance company  She thinks she is checking blood sugars only at night but review of monitor indicate she is doing some readings at different times   Side effects from medications have been: ?  Nausea from metformin  Compliance with the medical regimen: Fairly good  Hypoglycemia:    Glucose monitoring:  done 1 times a day         Glucometer:  true Metrix Blood Glucose readings by review of monitor:  Mean values apply above for all meters except median for One Touch  PRE-MEAL Fasting Lunch Dinner Bedtime Overall  Glucose range: 202-244   229  270, 279    Mean/median:        POST-MEAL PC Breakfast PC Lunch PC Dinner  Glucose range:  290    Mean/median:       Self-care: The diet that the patient has been following is: tries to limit sweets     Typical meal intake: Breakfast is cereal, sometimes bacon.  At meals will get fried food at times            Dietician visit, most recent: Never CDE visit: 1/18               Exercise:  walking and calisthenics up to 30 minutes twice a week   Weight history: Previous range 98-140   Wt Readings from Last 3 Encounters:  01/05/17 106 lb (48.1 kg)  11/14/16 105 lb 9.6 oz (47.9 kg)  10/20/16 109 lb (49.4 kg)    Glycemic control: A1c in October = 8.5  Lab Results  Component Value Date   HGBA1C 6.8 (H) 11/12/2015   Lab Results  Component Value Date   MICROALBUR <0.7 11/14/2016   CREATININE 1.46 (H) 11/14/2016   Lab Results  Component Value Date   MICRALBCREAT 1.8 11/14/2016       Allergies as of 01/05/2017   No Known Allergies     Medication List       Accurate as of 01/05/17  3:22 PM. Always use your most recent med list.          acetaminophen 325 MG tablet Commonly known as:  TYLENOL Take 2 tablets (650 mg total) by mouth every 6 (six) hours as needed for moderate pain or headache.   aspirin EC 81 MG tablet Take 81 mg by mouth daily.   buPROPion 150 MG 24 hr tablet Commonly known as:  WELLBUTRIN XL 150 mg. Take one tablet daily in the morning   feeding supplement (GLUCERNA SHAKE) Liqd Take 237 mLs by mouth 3 (three) times daily between meals.     fluticasone 50 MCG/ACT nasal spray Commonly known as:  FLONASE Place 2 sprays into both nostrils daily.   glimepiride 2 MG tablet Commonly known as:  AMARYL Take 1 tablet (2 mg total) by mouth 2 (two) times daily.   glucose blood test strip Commonly known as:  ACCU-CHEK GUIDE USE TO TEST BLOOD SUGAR 1 TIME DAILY- DX CODE E11.8   guaiFENesin 600 MG 12 hr tablet Commonly known as:  MUCINEX Take 1 tablet (600 mg total) by mouth 2 (two) times daily.   insulin degludec 100 UNIT/ML Sopn FlexTouch Pen Commonly known as:  TRESIBA Inject 6 units into the skin once daily   Insulin Pen Needle 32G X 4 MM Misc Commonly known as:  BD PEN NEEDLE NANO U/F Use to inject insulin once daily   isosorbide mononitrate 30 MG 24 hr tablet Commonly known as:  IMDUR 30 mg. One tablet daily   lovastatin 20 MG tablet Commonly known as:  MEVACOR Take 20 mg by mouth at bedtime.   meclizine 12.5 MG tablet Commonly known as:  ANTIVERT Take 1 tablet (12.5 mg total) by mouth 2 (two) times daily.   metoprolol tartrate 25 MG tablet Commonly known as:  LOPRESSOR Take 25 mg by mouth 2 (two) times daily.   multivitamin-lutein Caps capsule Take 1 capsule by mouth daily.   CENTRUM SILVER 50+MEN Tabs Take 1 tablet by mouth daily.   nitroGLYCERIN 0.3 MG SL tablet Commonly known as:  NITROSTAT Place 0.3 mg under the tongue every 5 (five) minutes as needed for chest pain.   pantoprazole 40 MG tablet Commonly known as:  PROTONIX Take 40 mg by mouth daily.   PROBIOTIC FORMULA Caps Take 1 capsule by mouth daily.   sitaGLIPtin 100 MG tablet Commonly known as:  JANUVIA Take 1 tablet (100 mg total) by mouth daily.   Turmeric Curcumin 500 MG Caps Take 1 capsule by mouth daily.   TURMERIC CURCUMIN PO Take by mouth.   vitamin C 1000 MG tablet Take 1,000 mg by mouth daily.       Allergies: No Known Allergies  Past Medical History:  Diagnosis Date  . Anxiety   . Arthritis    "all over"  .  Breast cancer ([place])    "both breasts"  . Chronic pancreatitis ([place])   . Daily headache   . Depression   . Dizziness   . GERD (gastroesophageal reflux disease)   . Giddiness   .  History of hiatal hernia   . Hyperlipemia breast cancer  . Hypertension   . IBS (irritable bowel syndrome)   . MI (myocardial infarction)   . Skin cancer of face    "don't know what kind"  . Type II diabetes mellitus ([place])     Past Surgical History:  Procedure Laterality Date  . BREAST SURGERY    . CATARACT EXTRACTION W/ INTRAOCULAR LENS  IMPLANT, BILATERAL Bilateral   . CHOLECYSTECTOMY OPEN  1970's  . MASTECTOMY Bilateral     History reviewed. No pertinent family history.  Social History:  reports that she has never smoked. She has never used smokeless tobacco. She reports that she does not drink alcohol or use drugs.   Review of Systems   Lipid history: Records Of previous labs are unavailable She is on lovastatin   No results found for: CHOL, HDL, LDLCALC, LDLDIRECT, TRIG, CHOLHDL         Hypertension: Mild and blood pressure is normal without apparently on any medications Still on metoprolol 25 mg  HYPERKALEMIA: She was told to restrict her high potassium foods when potassium was 5.2, again not clear if she was or is taking lisinopril  Most recent eye exam was 07/2016, reportedly no retinopathy  Most recent foot exam: 11/17     Physical Examination:  BP 128/60 (BP Location: Left Arm, Patient Position: Sitting, Cuff Size: Normal)   Pulse 78   Resp 16   Wt 106 lb (48.1 kg)   SpO2 98%   BMI 21.41 kg/m        ASSESSMENT:  Diabetes type 2, uncontrolled, nonobese  See history of present illness for detailed discussion of current diabetes management, blood sugar patterns and problems identified  She is currently taking only low-dose basal insulin with [name] She has difficulty with memory and does not know if she is taking any of her oral medications She is supposed to be  taking Januvia She thinks she does not want to take Glyxami because she is under the impression that it caused constipation She also reported intolerance to metformin  Glucose readings are in the 200+ range, consistently high in the morning also Also using a Generic monitor currently She had been recommended premixed insulin for both postprandial and basal insulin benefits but she prefers to do insulin once a day and has limitations on insurance coverage  History of hypokalemia and: Needs repeat testing  PLAN:    She will need to start an oral agent and would prefer to start Januvia especially if she has this already failed.  However she should do better with an SGLT 2 drugs in May consider Invokana, alternatively may also consider Actos  Increase insulin to 10 units  Given Accu-Chek it meter and showed her how to use this in place of the generic, she will need to get her test strips from [facility]  Discussed timing and targets of blood sugars  Again repeated that she needs to keep track of her medications and bring them on her next visit  Consider consultation with dietitian  Follow-up in 4 weeks  Patient Instructions  Increase insulin to 10 units daily  Check blood sugars on waking up  3-4 x weekly  Also check blood sugars about 2 hours after a meal and do this after different meals by rotation  Recommended blood sugar levels on waking up is 90-130 and about 2 hours after meal is 130-160  Please bring your blood sugar monitor to each visit, thank you  Restart Januvia  Clarify if you had constipation from Glyxambi  need to avoid foods like bananas, citrus fruits, orange juice and baked potatoes,   Counseling time on subjects discussed above is over 50% of today's 25 minute visit    Nunzio Banet 01/05/2017, 3:22 PM   Note: This office note was prepared with Estate agent. Any transcriptional errors that result from this process are  unintentional.

## 2017-01-05 NOTE — Telephone Encounter (Signed)
Pt called in and said that the name of the medication that she was referring to during her appointment today was Glimepiride.  It made her very constipated and she said she previously called in about this issue.

## 2017-01-06 LAB — FRUCTOSAMINE: FRUCTOSAMINE: 428 umol/L — AB (ref 0–285)

## 2017-01-06 NOTE — Telephone Encounter (Signed)
I contacted the patient she stated she did not know Vanessa Gibson was prescribed and would contact [facility] to verify the price of the [place].

## 2017-01-06 NOTE — Telephone Encounter (Signed)
She can check cost of Glyxambi

## 2017-01-06 NOTE — Telephone Encounter (Signed)
She was supposed to start on Januvia, that she have this or Glyxambi at home?

## 2017-01-06 NOTE — Telephone Encounter (Signed)
See message to be advised. Thanks!  

## 2017-01-06 NOTE — Telephone Encounter (Signed)
Please refill previous prescription

## 2017-01-06 NOTE — Telephone Encounter (Signed)
Vanessa Gibson is not affordable pt is asking for a cheaper option please  glimipiride needs to be refilled if she is to stay on this she only has 2 pills left call to [facility]

## 2017-01-06 NOTE — Telephone Encounter (Signed)
Rx would need to be submitted to the pharmacy for price to be verified.  Please advise, Thanks!

## 2017-01-08 MED ORDER — EMPAGLIFLOZIN-LINAGLIPTIN 10-5 MG PO TABS: 1.0000 | ORAL_TABLET | Freq: Every day | ORAL | 2 refills | Status: DC

## 2017-01-08 NOTE — Telephone Encounter (Signed)
Prescription submitted for Glyxambi 10-5 mg. Patient notified of this and voiced understanding.

## 2017-01-13 ENCOUNTER — Other Ambulatory Visit: Payer: Self-pay

## 2017-01-13 MED ORDER — CANAGLIFLOZIN 100 MG PO TABS
100.0000 mg | ORAL_TABLET | Freq: Every day | ORAL | 3 refills | Status: DC
Start: 2017-01-13 — End: 2017-03-06

## 2017-01-15 ENCOUNTER — Telehealth: Payer: Self-pay | Admitting: Endocrinology

## 2017-01-15 NOTE — Telephone Encounter (Signed)
Need refill of test strips   [id number] - [place], [place] - [place] [id number] (Phone) [phone] (Fax)

## 2017-01-19 ENCOUNTER — Other Ambulatory Visit: Payer: Self-pay

## 2017-01-19 MED ORDER — GLUCOSE BLOOD VI STRP: ORAL_STRIP | 5 refills | Status: DC

## 2017-01-19 NOTE — Telephone Encounter (Signed)
Ordered 01/19/17

## 2017-01-20 ENCOUNTER — Other Ambulatory Visit: Payer: Self-pay

## 2017-01-20 MED ORDER — GLUCOSE BLOOD VI STRP: ORAL_STRIP | 5 refills | Status: DC

## 2017-01-30 ENCOUNTER — Other Ambulatory Visit: Payer: Medicare HMO

## 2017-02-02 ENCOUNTER — Other Ambulatory Visit: Payer: Self-pay

## 2017-02-02 ENCOUNTER — Other Ambulatory Visit (INDEPENDENT_AMBULATORY_CARE_PROVIDER_SITE_OTHER): Payer: Medicare HMO

## 2017-02-02 ENCOUNTER — Ambulatory Visit: Payer: Medicare HMO | Admitting: Endocrinology

## 2017-02-02 DIAGNOSIS — E1165 Type 2 diabetes mellitus with hyperglycemia: Secondary | ICD-10-CM | POA: Diagnosis not present

## 2017-02-02 LAB — COMPREHENSIVE METABOLIC PANEL
ALBUMIN: 4.1 g/dL (ref 3.5–5.2)
ALK PHOS: 38 U/L — AB (ref 39–117)
ALT: 17 U/L (ref 0–35)
AST: 21 U/L (ref 0–37)
BILIRUBIN TOTAL: 0.4 mg/dL (ref 0.2–1.2)
BUN: 26 mg/dL — ABNORMAL HIGH (ref 6–23)
CALCIUM: 9.8 mg/dL (ref 8.4–10.5)
CO2: 29 meq/L (ref 19–32)
CREATININE: 1.21 mg/dL — AB (ref 0.40–1.20)
Chloride: 102 mEq/L (ref 96–112)
GFR: 44.81 mL/min — AB (ref >60.00)
Glucose, Bld: 157 mg/dL — ABNORMAL HIGH (ref 70–99)
Potassium: 5.2 mEq/L — ABNORMAL HIGH (ref 3.5–5.1)
Sodium: 138 mEq/L (ref 135–145)
Total Protein: 6.8 g/dL (ref 6.0–8.3)

## 2017-02-02 LAB — LIPID PANEL
CHOLESTEROL: 163 mg/dL (ref 0–200)
HDL: 48.7 mg/dL (ref >39.00)
LDL Cholesterol: 90 mg/dL (ref 0–99)
NonHDL: 114.02
TRIGLYCERIDES: 120 mg/dL (ref 0.0–149.0)
Total CHOL/HDL Ratio: 3
VLDL: 24 mg/dL (ref 0.0–40.0)

## 2017-02-02 LAB — HEMOGLOBIN A1C: Hgb A1c MFr Bld: 9.2 % — ABNORMAL HIGH (ref 4.6–6.5)

## 2017-02-02 MED ORDER — INSULIN PEN NEEDLE 32G X 4 MM MISC: 5 refills | Status: DC

## 2017-02-02 MED ORDER — INSULIN DEGLUDEC 100 UNIT/ML ~~LOC~~ SOPN: PEN_INJECTOR | SUBCUTANEOUS | 1 refills | Status: DC

## 2017-02-02 MED ORDER — GLIMEPIRIDE 2 MG PO TABS: 2.0000 mg | ORAL_TABLET | Freq: Two times a day (BID) | ORAL | 1 refills | Status: DC

## 2017-02-04 ENCOUNTER — Other Ambulatory Visit: Payer: Self-pay

## 2017-02-10 ENCOUNTER — Other Ambulatory Visit: Payer: Self-pay

## 2017-02-10 MED ORDER — INSULIN PEN NEEDLE 32G X 4 MM MISC: 2 refills | Status: DC

## 2017-02-10 MED ORDER — GLIMEPIRIDE 2 MG PO TABS: 2.0000 mg | ORAL_TABLET | Freq: Two times a day (BID) | ORAL | 1 refills | Status: DC

## 2017-02-10 MED ORDER — INSULIN DEGLUDEC 100 UNIT/ML ~~LOC~~ SOPN: PEN_INJECTOR | SUBCUTANEOUS | 1 refills | Status: DC

## 2017-02-12 ENCOUNTER — Encounter: Payer: Self-pay | Admitting: Endocrinology

## 2017-02-12 ENCOUNTER — Ambulatory Visit (INDEPENDENT_AMBULATORY_CARE_PROVIDER_SITE_OTHER): Payer: Medicare HMO | Admitting: Endocrinology

## 2017-02-12 VITALS — BP 120/60 | HR 78 | Ht 59.0 in | Wt 108.0 lb

## 2017-02-12 DIAGNOSIS — E1165 Type 2 diabetes mellitus with hyperglycemia: Secondary | ICD-10-CM

## 2017-02-12 LAB — POCT GLUCOSE (DEVICE FOR HOME USE): Glucose Fasting, POC: 272 mg/dL — AB (ref 70–99)

## 2017-02-12 NOTE — Progress Notes (Signed)
Patient ID: Vanessa Gibson, female   DOB: 1928-03-30, 83 y.o.   MRN: [id number]           Reason for Appointment: Follow-up for Type 2 Diabetes  Referring physician: Drema Dallas   History of Present Illness:          Date of diagnosis of type 2 diabetes mellitus: 1997        Background history:   She thinks she has been treated with metformin mostly since her diagnosis At some point was given glipizide/metformin combination detailed history is not able both from PCP Also not clear what level of control she has had until recently  Recent history:   Non-insulin hypoglycemic drugs the patient is taking are: Amaryl 2 mg in the morning INSULIN dose: Tresiba 10 units daily  Her A1c in October was 8.5 before her initial consultation is now 9.2  Current management, blood sugar patterns and problems identified:  She was still having relatively high blood sugars with taking 6 units of Tresiba on her last visit.  She was told to increase this to 10 units  She was also given the option taking premixed insulin twice a day but since she already had [name] she did not want to do this  She is finally taking her blood sugars with an Accu-Chek monitor which can be downloaded  Despite giving her prescriptions for several oral medications including Januvia, Invokana and Glyxami she refuses to take any of those because of expense.  She claims that Amaryl makes her constipated and is taking this only once a day in the morning  She is not taking any POSTPRANDIAL reading despite reminders  Fasting blood sugars are reasonably good in the morning averaging about 138  She thinks that since her blood sugars are consistently the morning she does not check readings later in the day.  Today with eating minimal amount of carbohydrate at lunch her sugar in the afternoon is over 200; she did have a couple of readings about 3 weeks ago at night which were over 200       Side effects from medications have been:  ?  Nausea from metformin  Compliance with the medical regimen: Fairly good  Hypoglycemia:    Glucose monitoring:  done 1 times a day         Glucometer:  Accu-Chek  Blood Glucose readings by review of download:  Mean values apply above for all meters except median for One Touch  PRE-MEAL Fasting Lunch Dinner Bedtime Overall  Glucose range: 98-187    240, 219    Mean/median: 138     145     Self-care: The diet that the patient has been following is: tries to limit sweets     Typical meal intake: Breakfast is cereal, sometimes bacon.  At meals will get fried food at times            Dietician visit, most recent: Never CDE visit: 1/18               Exercise:  walking and calisthenics up to 30 minutes twice a week   Weight history: Previous range 98-140   Wt Readings from Last 3 Encounters:  02/12/17 108 lb (49 kg)  01/05/17 106 lb (48.1 kg)  11/14/16 105 lb 9.6 oz (47.9 kg)    Glycemic control: A1c in October = 8.5    Lab Results  Component Value Date   HGBA1C 9.2 (H) 02/02/2017   HGBA1C 6.8 (H) 11/12/2015  Lab Results  Component Value Date   MICROALBUR <0.7 11/14/2016   LDLCALC 90 02/02/2017   CREATININE 1.21 (H) 02/02/2017   Lab Results  Component Value Date   MICRALBCREAT 1.8 11/14/2016       Allergies as of 02/12/2017   No Known Allergies     Medication List       Accurate as of 02/12/17  9:19 PM. Always use your most recent med list.          acetaminophen 325 MG tablet Commonly known as:  TYLENOL Take 2 tablets (650 mg total) by mouth every 6 (six) hours as needed for moderate pain or headache.   aspirin EC 81 MG tablet Take 81 mg by mouth daily.   buPROPion 150 MG 24 hr tablet Commonly known as:  WELLBUTRIN XL 150 mg. Take one tablet daily in the morning   canagliflozin 100 MG Tabs tablet Commonly known as:  INVOKANA Take 1 tablet (100 mg total) by mouth daily before breakfast.   Empagliflozin-Linagliptin 10-5 MG Tabs Commonly known  as:  GLYXAMBI Take 1 tablet by mouth daily before breakfast.   feeding supplement (GLUCERNA SHAKE) Liqd Take 237 mLs by mouth 3 (three) times daily between meals.   fluticasone 50 MCG/ACT nasal spray Commonly known as:  FLONASE Place 2 sprays into both nostrils daily.   glimepiride 2 MG tablet Commonly known as:  AMARYL Take 1 tablet (2 mg total) by mouth 2 (two) times daily.   glucose blood test strip Commonly known as:  ACCU-CHEK GUIDE USE TO TEST BLOOD SUGAR 1 TIME DAILY- DX CODE E11.8   guaiFENesin 600 MG 12 hr tablet Commonly known as:  MUCINEX Take 1 tablet (600 mg total) by mouth 2 (two) times daily.   insulin degludec 100 UNIT/ML Sopn FlexTouch Pen Commonly known as:  TRESIBA Inject 6 units into the skin once daily   Insulin Pen Needle 32G X 4 MM Misc Commonly known as:  BD PEN NEEDLE NANO U/F Use to inject insulin once daily   isosorbide mononitrate 30 MG 24 hr tablet Commonly known as:  IMDUR 30 mg. One tablet daily   lovastatin 20 MG tablet Commonly known as:  MEVACOR Take 20 mg by mouth at bedtime.   meclizine 12.5 MG tablet Commonly known as:  ANTIVERT Take 1 tablet (12.5 mg total) by mouth 2 (two) times daily.   metoprolol tartrate 25 MG tablet Commonly known as:  LOPRESSOR Take 25 mg by mouth 2 (two) times daily.   multivitamin-lutein Caps capsule Take 1 capsule by mouth daily.   CENTRUM SILVER 50+MEN Tabs Take 1 tablet by mouth daily.   nitroGLYCERIN 0.3 MG SL tablet Commonly known as:  NITROSTAT Place 0.3 mg under the tongue every 5 (five) minutes as needed for chest pain.   pantoprazole 40 MG tablet Commonly known as:  PROTONIX Take 40 mg by mouth daily.   PROBIOTIC FORMULA Caps Take 1 capsule by mouth daily.   sitaGLIPtin 100 MG tablet Commonly known as:  JANUVIA Take 1 tablet (100 mg total) by mouth daily.   Turmeric Curcumin 500 MG Caps Take 1 capsule by mouth daily.   TURMERIC CURCUMIN PO Take by mouth.   vitamin C 1000 MG  tablet Take 1,000 mg by mouth daily.       Allergies: No Known Allergies  Past Medical History:  Diagnosis Date  . Anxiety   . Arthritis    "all over"  . Breast cancer ([place])    "both breasts"  .  Chronic pancreatitis ([place])   . Daily headache   . Depression   . Dizziness   . GERD (gastroesophageal reflux disease)   . Giddiness   . History of hiatal hernia   . Hyperlipemia breast cancer  . Hypertension   . IBS (irritable bowel syndrome)   . MI (myocardial infarction)   . Skin cancer of face    "don't know what kind"  . Type II diabetes mellitus ([place])     Past Surgical History:  Procedure Laterality Date  . BREAST SURGERY    . CATARACT EXTRACTION W/ INTRAOCULAR LENS  IMPLANT, BILATERAL Bilateral   . CHOLECYSTECTOMY OPEN  1970's  . MASTECTOMY Bilateral     No family history on file.  Social History:  reports that she has never smoked. She has never used smokeless tobacco. She reports that she does not drink alcohol or use drugs.   Review of Systems   Lipid history:  She is on lovastatin With the following results    Lab Results  Component Value Date   CHOL 163 02/02/2017   HDL 48.70 02/02/2017   LDLCALC 90 02/02/2017   TRIG 120.0 02/02/2017   CHOLHDL 3 02/02/2017           Hypertension: Mild and blood pressure is normal without apparently on any medications She is on metoprolol 25 mg  HYPERKALEMIA: She was told to restrict her high potassium foods when potassium was 5.2, again this is relatively high  Lab Results  Component Value Date   CREATININE 1.21 (H) 02/02/2017   BUN 26 (H) 02/02/2017   NA 138 02/02/2017   K 5.2 (H) 02/02/2017   CL 102 02/02/2017   CO2 29 02/02/2017    Most recent eye exam was 07/2016, reportedly no retinopathy  Most recent foot exam: 11/17     Physical Examination:  BP 120/60   Pulse 78   Ht 4\' 11"  (1.499 m)   Wt 108 lb (49 kg)   SpO2 96%   BMI 21.81 kg/m        ASSESSMENT:  Diabetes type 2,  uncontrolled, nonobese  See history of present illness for detailed discussion of current diabetes management, blood sugar patterns and problems identified  She is currently taking only low-dose basal insulin with [name] With 10 units of insulin fasting readings are relatively good and near normal However she has consistently high postprandial readings over 200 and her A1c is now 9.2  She is refusing to take any oral medications because of expense, only taking 2 mg Amaryl which is ineffective She has great difficulty understanding management of diabetes and not clear if she is doing her insulin injections currently also, does not appear to have much family help  History of hypokalemia and: Needs restriction of potassium and died more consistently  PLAN:    She will need to start mealtime insulin  Because of the cost she was given a sample of Humalog pen and demonstrated how to dial the dose for 3 units  Repeatedly the instructions to do the insulin and timing was reviewed on the printout today  She was specifically told when to take Toujeo and went to take the Humalog insulin  Since she takes her Toujeo about 8:30 PM she can check her sugar at bedtime to help identify postprandial reading  She does not need to check readings in the morning daily  Consider consultation with dietitian  Avoid high-fat foods  Continue Amaryl for now  Follow-up in 4 weeks  Patient Instructions  Take 3 Humalog gray pen just before DINNER   Must alternate CHECKING SUGAR BETWEEN AM AND AT THE TIME OF THE GREEN SHOT  [place] 10 UNITS AT [place]   Counseling time on subjects discussed above is over 50% of today's 25 minute visit    Vanessa Gibson 02/12/2017, 9:19 PM   Note: This office note was prepared with Estate agent. Any transcriptional errors that result from this process are unintentional.

## 2017-02-12 NOTE — Patient Instructions (Addendum)
Take 3 Humalog gray pen just before DINNER   Must alternate CHECKING SUGAR BETWEEN AM AND AT THE TIME OF THE GREEN SHOT  TRESIBA 10 UNITS AT [place]

## 2017-02-17 DIAGNOSIS — E1165 Type 2 diabetes mellitus with hyperglycemia: Secondary | ICD-10-CM | POA: Diagnosis not present

## 2017-02-17 DIAGNOSIS — I10 Essential (primary) hypertension: Secondary | ICD-10-CM | POA: Diagnosis not present

## 2017-02-17 DIAGNOSIS — Z7984 Long term (current) use of oral hypoglycemic drugs: Secondary | ICD-10-CM | POA: Diagnosis not present

## 2017-02-17 DIAGNOSIS — I2510 Atherosclerotic heart disease of native coronary artery without angina pectoris: Secondary | ICD-10-CM | POA: Diagnosis not present

## 2017-02-17 DIAGNOSIS — Z794 Long term (current) use of insulin: Secondary | ICD-10-CM | POA: Diagnosis not present

## 2017-02-17 DIAGNOSIS — E7800 Pure hypercholesterolemia, unspecified: Secondary | ICD-10-CM | POA: Diagnosis not present

## 2017-02-17 DIAGNOSIS — R413 Other amnesia: Secondary | ICD-10-CM | POA: Diagnosis not present

## 2017-02-19 ENCOUNTER — Telehealth: Payer: Self-pay | Admitting: Endocrinology

## 2017-02-19 NOTE — Telephone Encounter (Signed)
Please call her daughter [name] to discuss meds the pt is confused

## 2017-02-23 ENCOUNTER — Other Ambulatory Visit: Payer: Self-pay

## 2017-02-23 ENCOUNTER — Telehealth: Payer: Self-pay | Admitting: Endocrinology

## 2017-02-23 ENCOUNTER — Telehealth: Payer: Self-pay

## 2017-02-23 NOTE — Telephone Encounter (Signed)
[facility] Call:  Caller states she would like to speak to her Dr. She has a question about her medication.

## 2017-02-23 NOTE — Telephone Encounter (Signed)
Could not reach Vanessa Gibson but left a vm did speak to patient and explained how to take her insulin

## 2017-02-23 NOTE — Telephone Encounter (Signed)
Just wanted clarification due to the fact she was stating another doctor told her to take humalog with every meal- I left a vm explaining instructions and requesting a return call if she has any questions

## 2017-02-23 NOTE — Telephone Encounter (Signed)
Spoke to daughter [name] and explained her mothers insulin doses and she stated an understanding she stated Dr. Zadie Rhine told patient to take the Humalog before each meal- this has been corrected

## 2017-02-23 NOTE — Telephone Encounter (Signed)
My previous note indicates her doses:  Take 3 units of Humalog/ gray pen just before DINNER    TRESIBA 10 UNITS at night

## 2017-02-23 NOTE — Telephone Encounter (Signed)
Tried to reach daughter but she was unavailable so I spoke to the patient and she is very confused about how much insulin she is supposed to be taking- was taking 6 units of tresiba and thinks the doctor switched it to 10 units at night- she is also taking 3 units of other insulin and she wants to know if this is all correct please advise

## 2017-03-06 ENCOUNTER — Ambulatory Visit (INDEPENDENT_AMBULATORY_CARE_PROVIDER_SITE_OTHER): Payer: Medicare HMO | Admitting: Endocrinology

## 2017-03-06 VITALS — BP 112/52 | HR 76 | Wt 112.0 lb

## 2017-03-06 DIAGNOSIS — E1165 Type 2 diabetes mellitus with hyperglycemia: Secondary | ICD-10-CM

## 2017-03-06 NOTE — Patient Instructions (Signed)
GLUCOSE TESTING instructions  Check blood sugar on waking up and at bedtime, do not need to do this every single day  Occasionally may also check sugar midafternoon   INSULIN:  May take BOTH the shots right before dinner  GREEN pen: Reduce the dose to 8 units and continue units for the [facility] pen  Please call me about a week or 2 later to let me know how you readings are at bedtime

## 2017-03-06 NOTE — Progress Notes (Signed)
Patient ID: Vanessa Gibson, female   DOB: [birth year], 83 y.o.   MRN: [id number]           Reason for Appointment: Follow-up for Type 2 Diabetes  Referring physician: Drema Dallas   History of Present Illness:          Date of diagnosis of type 2 diabetes mellitus: 1997        Background history:   She had been treated with metformin mostly since her diagnosis At some point was given glipizide/metformin combination, detailed history is not available from PCP Also not clear what level of control she has had until recently  Recent history:   Non-insulin hypoglycemic drugs the patient is taking are: Amaryl 2 mg in the morning  INSULIN dose: Tresiba 10 units daily at night, Humalog 3 units at supper  Her A1c in October 27 was 8.5 before her initial consultation and in 3/18 was 9.2  Current management, blood sugar patterns and problems identified:  She was in March having relatively high blood sugars after her evening meal with frequent readings over 200  For this reason she was started on HUMALOG 3 units at suppertime  She thinks she is taking this fairly consistently when she starts eating her meals and has no difficulty with doing the injection  However she has not done any readings after evening meal for the last 2 weeks or so and not clear if her blood sugars were improved at night with starting this  FASTING blood sugars are excellent now and as low as 80 with continuing TRESIBA 10 units daily, prescription was sent last month, she thinks she is consistent with taking this at night  She was also getting relatively high readings during the daytime but recently these are somewhat variable with some readings around 200 after lunch  With continued insulin her weight has gone up 4 pounds       Side effects from medications have been: ?  Nausea from metformin  Compliance with the medical regimen: Fairly good  Hypoglycemia: None    Glucose monitoring:  done 1 times a day          Glucometer:  Accu-Chek  Blood Glucose readings by review of download for the last 4 weeks:  Mean values apply above for all meters except median for One Touch  PRE-MEAL Fasting Lunch Afternoon  Bedtime Overall  Glucose range: 87-1 71   10 9-217  2 18-231    Mean/median: 132   168   150     Self-care: The diet that the patient has been following is: tries to limit sweets  She has her meals with assisted living facility     Typical meal intake: Breakfast is cereal, sometimes bacon.  At meals will get fried food at times            Dietician visit, most recent: Never CDE visit: 1/18               Exercise:  walking and calisthenics up to 30 minutes twice a week   Weight history: Previous range 98-140   Wt Readings from Last 3 Encounters:  03/06/17 112 lb (50.8 kg)  02/12/17 108 lb (49 kg)  01/05/17 106 lb (48.1 kg)    Glycemic control: A1c in October = 8.5    Lab Results  Component Value Date   HGBA1C 9.2 (H) 02/02/2017   HGBA1C 6.8 (H) 11/12/2015   Lab Results  Component Value Date   MICROALBUR <0.7 11/14/2016  LDLCALC 90 02/02/2017   CREATININE 1.21 (H) 02/02/2017   Lab Results  Component Value Date   MICRALBCREAT 1.8 11/14/2016       Allergies as of 03/06/2017   No Known Allergies     Medication List       Accurate as of 03/06/17  3:19 PM. Always use your most recent med list.          acetaminophen 325 MG tablet Commonly known as:  TYLENOL Take 2 tablets (650 mg total) by mouth every 6 (six) hours as needed for moderate pain or headache.   aspirin EC 81 MG tablet Take 81 mg by mouth daily.   buPROPion 150 MG 24 hr tablet Commonly known as:  WELLBUTRIN XL 150 mg. Take one tablet daily in the morning   feeding supplement (GLUCERNA SHAKE) Liqd Take 237 mLs by mouth 3 (three) times daily between meals.   fluticasone 50 MCG/ACT nasal spray Commonly known as:  FLONASE Place 2 sprays into both nostrils daily.   glimepiride 2 MG tablet Commonly  known as:  AMARYL Take 1 tablet (2 mg total) by mouth 2 (two) times daily.   glucose blood test strip Commonly known as:  ACCU-CHEK GUIDE USE TO TEST BLOOD SUGAR 1 TIME DAILY- DX CODE E11.8   guaiFENesin 600 MG 12 hr tablet Commonly known as:  MUCINEX Take 1 tablet (600 mg total) by mouth 2 (two) times daily.   insulin degludec 100 UNIT/ML Sopn FlexTouch Pen Commonly known as:  TRESIBA Inject 6 units into the skin once daily   insulin lispro 100 UNIT/ML injection Commonly known as:  HUMALOG Inject 3 Units into the skin daily before supper.   Insulin Pen Needle 32G X 4 MM Misc Commonly known as:  BD PEN NEEDLE NANO U/F Use to inject insulin once daily   isosorbide mononitrate 30 MG 24 hr tablet Commonly known as:  IMDUR 30 mg. One tablet daily   levocetirizine 5 MG tablet Commonly known as:  XYZAL Take 5 mg by mouth every evening.   lovastatin 20 MG tablet Commonly known as:  MEVACOR Take 20 mg by mouth at bedtime.   meclizine 12.5 MG tablet Commonly known as:  ANTIVERT Take 1 tablet (12.5 mg total) by mouth 2 (two) times daily.   metoprolol tartrate 25 MG tablet Commonly known as:  LOPRESSOR Take 25 mg by mouth 2 (two) times daily.   multivitamin-lutein Caps capsule Take 1 capsule by mouth daily.   CENTRUM SILVER 50+MEN Tabs Take 1 tablet by mouth daily.   nitroGLYCERIN 0.3 MG SL tablet Commonly known as:  NITROSTAT Place 0.3 mg under the tongue every 5 (five) minutes as needed for chest pain.   pantoprazole 40 MG tablet Commonly known as:  PROTONIX Take 40 mg by mouth daily.   PROBIOTIC FORMULA Caps Take 1 capsule by mouth daily.   Turmeric Curcumin 500 MG Caps Take 1 capsule by mouth daily.   TURMERIC CURCUMIN PO Take by mouth.   vitamin C 1000 MG tablet Take 1,000 mg by mouth daily.       Allergies: No Known Allergies  Past Medical History:  Diagnosis Date  . Anxiety   . Arthritis    "all over"  . Breast cancer ([place])    "both breasts"   . Chronic pancreatitis ([place])   . Daily headache   . Depression   . Dizziness   . GERD (gastroesophageal reflux disease)   . Giddiness   . History of hiatal hernia   .  Hyperlipemia breast cancer  . Hypertension   . IBS (irritable bowel syndrome)   . MI (myocardial infarction)   . Skin cancer of face    "don't know what kind"  . Type II diabetes mellitus ([place])     Past Surgical History:  Procedure Laterality Date  . BREAST SURGERY    . CATARACT EXTRACTION W/ INTRAOCULAR LENS  IMPLANT, BILATERAL Bilateral   . CHOLECYSTECTOMY OPEN  1970's  . MASTECTOMY Bilateral     No family history on file.  Social History:  reports that she has never smoked. She has never used smokeless tobacco. She reports that she does not drink alcohol or use drugs.   Review of Systems   Lipid history:  She is on lovastatin With the following results    Lab Results  Component Value Date   CHOL 163 02/02/2017   HDL 48.70 02/02/2017   LDLCALC 90 02/02/2017   TRIG 120.0 02/02/2017   CHOLHDL 3 02/02/2017           Hypertension: Mild and blood pressure is normal without  any medications She is on metoprolol 25 mg, Has history of CAD  HYPERKALEMIA: She was told to restrict her high potassium foods when potassium was 5.2   Lab Results  Component Value Date   CREATININE 1.21 (H) 02/02/2017   BUN 26 (H) 02/02/2017   NA 138 02/02/2017   K 5.2 (H) 02/02/2017   CL 102 02/02/2017   CO2 29 02/02/2017    Most recent eye exam was 07/2016, reportedly no retinopathy  Most recent foot exam: 11/17     Physical Examination:  BP (!) 112/52   Pulse 76   Wt 112 lb (50.8 kg)   BMI 22.62 kg/m        ASSESSMENT:  Diabetes type 2, uncontrolled, nonobese  See history of present illness for detailed discussion of current diabetes management, blood sugar patterns and problems identified  She is currently taking only low-dose insulin with [name] and Humalog at supper which is her main  meal  With 10 units of Tresiba insulin fasting readings are excellent and averaging 132 recently with occasional low normal readings also She is compliant with injections at night She was started on Humalog at suppertime which she thinks she is taking regularly However she has not done any blood sugars after supper in the last 2 weeks and not clear if she still has readings over 200 at night She is still not understanding the differences between the 2 insulins and she has 2 family members assisting her today  She is only on  Amaryl as an oral agent as she cannot afford brand name drugs that have been prescribed and she reports side effects from metformin She appears to be insulin dependent and should get good control with low-dose basal bolus insulin regimen  History of hyperkalemia: Needs follow-up with PCP If this is persistent may be a candidate for [place] low doses  PLAN:    She will need to start checking her blood sugars after supper  Since her morning readings are low normal at times she will reduce her [place] down to 8 units  Her family will call in a couple of weeks to report her postprandial readings at night and consider increasing Humalog if needed  Since her readings are averaging about 168 after lunch/before supper will not as yet consider lunchtime coverage  For convenience she can take both her insulin doses before supper  A1c on the next visit  Continue Amaryl for now  Follow-up in 8 weeks  Written instructions for glucose monitoring, insulin timing, blood sugar targets were reviewed with the patient and family  Patient Instructions  GLUCOSE TESTING instructions  Check blood sugar on waking up and at bedtime, do not need to do this every single day  Occasionally may also check sugar midafternoon   INSULIN:  May take BOTH the shots right before dinner  GREEN pen: Reduce the dose to 8 units and continue units for the [facility] pen  Please call me about a  week or 2 later to let me know how you readings are at bedtime   Counseling time on subjects discussed above is over 50% of today's 25 minute visit    Cade Dashner 03/06/2017, 3:19 PM   Note: This office note was prepared with Dragon voice recognition system technology. Any transcriptional errors that result from this process are unintentional.

## 2017-03-26 ENCOUNTER — Telehealth: Payer: Self-pay | Admitting: Endocrinology

## 2017-03-26 NOTE — Telephone Encounter (Signed)
Need to know exactly when her blood sugars are high, she will have to have her family member read her meter call, cannot adjust the dose without looking at this The blood sugar levels she is mentioning will not make her shaky or tired, she may also need to talk to her PCP

## 2017-03-26 NOTE — Telephone Encounter (Signed)
Patient stated she is returning your call.  

## 2017-03-26 NOTE — Telephone Encounter (Signed)
I contacted the patient. She wanted to report her blood sugar readings. She stated her reading have been ranging between 180-257. I asked if she could report the past three days worth of blood sugar readings and she stated should could not. [name] stated she recently had been prescribed Humalog 3 units with supper and she does not think this medication is agreeing with her. [name] stated she has been feeling poorly and shaky since starting this medication. She stated when she was taking 10 uints of the tresiba she felt better and her blood sugar readings were lower. [name] wanted to know if she could go back to taking just the tresiba 10 units daily? Please advise.

## 2017-03-27 NOTE — Telephone Encounter (Signed)
[name] notified of message and she is going to bring the meter by the office on 03/30/2017 for the meter to be downloaded for MD to review.

## 2017-03-30 ENCOUNTER — Other Ambulatory Visit (INDEPENDENT_AMBULATORY_CARE_PROVIDER_SITE_OTHER): Payer: Medicare HMO

## 2017-03-30 DIAGNOSIS — E1165 Type 2 diabetes mellitus with hyperglycemia: Secondary | ICD-10-CM

## 2017-03-30 LAB — HEMOGLOBIN A1C: Hgb A1c MFr Bld: 8.9 % — ABNORMAL HIGH (ref 4.6–6.5)

## 2017-03-30 LAB — BASIC METABOLIC PANEL
BUN: 35 mg/dL — AB (ref 6–23)
CALCIUM: 9.7 mg/dL (ref 8.4–10.5)
CO2: 28 meq/L (ref 19–32)
CREATININE: 1.17 mg/dL (ref 0.40–1.20)
Chloride: 101 mEq/L (ref 96–112)
GFR: 46.57 mL/min — ABNORMAL LOW (ref >60.00)
Glucose, Bld: 145 mg/dL — ABNORMAL HIGH (ref 70–99)
Potassium: 5.6 mEq/L — ABNORMAL HIGH (ref 3.5–5.1)
SODIUM: 134 meq/L — AB (ref 135–145)

## 2017-03-31 NOTE — Progress Notes (Signed)
Please call to let patient's family know that the potassium is higher than before and she needs to consult with PCP about this.  Also overall sugars are high and we need to see her sooner than in June.

## 2017-04-01 NOTE — Telephone Encounter (Signed)
Spoke with pt-discussed lab results-she is going to make an appt with a new PCP. Will be calling back to see if she can come in tomorrow instead of June.

## 2017-04-03 ENCOUNTER — Telehealth: Payer: Self-pay | Admitting: Endocrinology

## 2017-04-03 NOTE — Telephone Encounter (Signed)
We have been unable to find out what her blood sugars are, she on her family member will need to report these to [place] Please let her know that insulin does not make anybody feel bad, it is likely to be either high blood sugars or another problem for which she needs to see her PCP Has she talked to her PCP about her high potassium level, she needs to be going to see her PCP for this anyway

## 2017-04-03 NOTE — Telephone Encounter (Signed)
Pt called in and said that ever since she has been on the second insulin that it has been "ruining her life" and she said that she doesn't understand why she has to wait so long to suffer before she sees Dr. Dwyane Dee.  She said she doesn't understand why she is feeling so bad because she thinks she is doing everything she needs to.  She requests a call back as soon as possible.

## 2017-04-03 NOTE — Telephone Encounter (Signed)
Was unable to reach patient on her phone or her daughters will have to try again later

## 2017-04-06 NOTE — Telephone Encounter (Signed)
She needs to discuss her blood sugars as soon as possible and follow-up with her PCP regarding high potassium

## 2017-04-07 NOTE — Telephone Encounter (Signed)
Patient called me and she stated that her blood sugars are a little better today and that she is currently under a lot of stress. She stated that she feels that since she added another injection she feels that her sugars are not doing as well as they were before. She will discuss this more on her follow up visit with [place] and will call us if her sugars get higher.

## 2017-04-07 NOTE — Telephone Encounter (Signed)
I have tried to call this patient numerous times last week and this week. Unable to speak to someone, have left voice messages on her phone and her daughter Ivy's phone. Hopefully someone will call back soon.

## 2017-04-29 ENCOUNTER — Other Ambulatory Visit: Payer: Self-pay

## 2017-04-29 MED ORDER — GLUCOSE BLOOD VI STRP: ORAL_STRIP | 5 refills | Status: DC

## 2017-04-29 MED ORDER — BD SWAB SINGLE USE REGULAR PADS: MEDICATED_PAD | 2 refills | Status: DC

## 2017-04-29 MED ORDER — MECLIZINE HCL 12.5 MG PO TABS: 12.5000 mg | ORAL_TABLET | Freq: Two times a day (BID) | ORAL | 0 refills | Status: DC

## 2017-04-30 ENCOUNTER — Other Ambulatory Visit: Payer: Self-pay

## 2017-04-30 ENCOUNTER — Telehealth: Payer: Self-pay | Admitting: Endocrinology

## 2017-04-30 ENCOUNTER — Ambulatory Visit: Payer: Medicare HMO | Admitting: Endocrinology

## 2017-04-30 NOTE — Telephone Encounter (Signed)
**  Remind patient they can make refill requests via MyChart**  Medication refill request (Name & Dosage):  insulin lispro (HUMALOG) 100 UNIT/ML injection   Preferred pharmacy (Name & Address):  [facility] - [place], [place] [phone] (Phone) [phone] (Fax)    Other comments (if applicable):   Please call patient as soon as possible to advise, patient stated that her blood sugar is 180-200 and she has not been feeling great. Advise before refilling. Okay to leave a detailed message on patient's home.

## 2017-04-30 NOTE — Telephone Encounter (Signed)
Fill her prescription, if she is needing the Humalog right away she can get it from local drugstore Let her know that blood sugar of  180-200 does not make her feel bad and she needs to discuss her malaise with PCP She is to bring her glucose monitor with her on the next visit with some readings in the morning and some 2-3 hours after lunch and dinner

## 2017-05-01 ENCOUNTER — Other Ambulatory Visit: Payer: Self-pay

## 2017-05-01 MED ORDER — INSULIN LISPRO 100 UNIT/ML ~~LOC~~ SOLN: 3.0000 [IU] | Freq: Every day | SUBCUTANEOUS | 1 refills | Status: DC

## 2017-05-01 NOTE — Telephone Encounter (Signed)
[facility] and they have not sent her Humalog but she can pick up at [facility]. Called patient back to verify for her.

## 2017-05-01 NOTE — Telephone Encounter (Signed)
Called patient and she stated that she is very depressed and she is not eating as much as she used to. She is not sure about taking the Humalog. I have refilled this for her to the [facility]. I gave her the instructions.

## 2017-05-05 ENCOUNTER — Other Ambulatory Visit: Payer: Self-pay

## 2017-05-05 ENCOUNTER — Encounter: Payer: Self-pay | Admitting: Endocrinology

## 2017-05-05 ENCOUNTER — Ambulatory Visit (INDEPENDENT_AMBULATORY_CARE_PROVIDER_SITE_OTHER): Payer: Medicare HMO | Admitting: Endocrinology

## 2017-05-05 ENCOUNTER — Telehealth: Payer: Self-pay | Admitting: Endocrinology

## 2017-05-05 VITALS — BP 122/62 | HR 67 | Ht 59.0 in | Wt 113.8 lb

## 2017-05-05 DIAGNOSIS — E1165 Type 2 diabetes mellitus with hyperglycemia: Secondary | ICD-10-CM | POA: Diagnosis not present

## 2017-05-05 DIAGNOSIS — Z794 Long term (current) use of insulin: Secondary | ICD-10-CM | POA: Diagnosis not present

## 2017-05-05 DIAGNOSIS — E875 Hyperkalemia: Secondary | ICD-10-CM

## 2017-05-05 LAB — GLUCOSE, POCT (MANUAL RESULT ENTRY): POC GLUCOSE: 297 mg/dL — AB (ref 70–99)

## 2017-05-05 MED ORDER — INSULIN ASPART 100 UNIT/ML FLEXPEN: PEN_INJECTOR | SUBCUTANEOUS | 4 refills | Status: DC

## 2017-05-05 MED ORDER — INSULIN LISPRO 100 UNIT/ML (KWIKPEN): PEN_INJECTOR | SUBCUTANEOUS | 1 refills | Status: DC

## 2017-05-05 NOTE — Telephone Encounter (Signed)
Novolog has been ordered

## 2017-05-05 NOTE — Progress Notes (Signed)
Patient ID: Vanessa Gibson, female   DOB: [birth year], 83 y.o.   MRN: [id number]           Reason for Appointment: Follow-up for Type 2 Diabetes  Referring physician: Drema Dallas   History of Present Illness:          Date of diagnosis of type 2 diabetes mellitus: 1997        Background history:   She had been treated with metformin mostly since her diagnosis At some point was given glipizide/metformin combination, detailed history is not available from PCP Also not clear what level of control she has had until recently  Recent history:   Non-insulin hypoglycemic drugs the patient is taking are: Amaryl 2 mg in the morning  INSULIN dose: Tresiba 8 units daily at night, Humalog 3 units at supper  Her A1c is high at 8.9 as of last month, previously 9.2  Current management, blood sugar patterns and problems identified:  She is trying to take her insulin doses as directed and is taking Humalog at suppertime  When starting Humalog her Tyler Aas was reduced by 2 units, previously taking 10 units  However despite repeated reminders she does not check her readings after meals  She continues to complain about not feeling good but her blood sugars are averaging in the 160s at home only  She has gained another pound since starting insulin  As before she has been unable to afford brand name oral medications and is only taking Amaryl  Today in the office her blood sugar was nearly 300, had cereal about 3 hours before  FASTING blood sugars are overall mildly increased although somewhat variable, about 2 weeks ago was having better readings as low as 129 She is currently keeping the pen needle on the insulin pen and does not remove it after injection       Side effects from medications have been: ?  Nausea from metformin  Compliance with the medical regimen: Fairly good  Hypoglycemia: None    Glucose monitoring:  done 1 times a day         Glucometer:  Accu-Chek  Blood Glucose readings by  review of download for the last 4 weeks:  Mean values apply above for all meters except median for One Touch  PRE-MEAL Fasting Lunch Dinner Bedtime Overall  Glucose range: 129-205  168-185    Mean/median: 162    164    Self-care: The diet that the patient has been following is: tries to limit sweets  She has her meals with assisted living facility     Typical meal intake: Breakfast is cereal, sometimes bacon.  At meals will get fried food at times            Dietician visit, most recent: Never CDE visit: 1/18               Exercise:  walking and calisthenics up to 30 minutes twice a week   Weight history: Previous range 98-140   Wt Readings from Last 3 Encounters:  05/05/17 113 lb 12.8 oz (51.6 kg)  03/06/17 112 lb (50.8 kg)  02/12/17 108 lb (49 kg)    Glycemic control: A1c in October 2017 = 8.5    Lab Results  Component Value Date   HGBA1C 8.9 (H) 03/30/2017   HGBA1C 9.2 (H) 02/02/2017   HGBA1C 6.8 (H) 11/12/2015   Lab Results  Component Value Date   MICROALBUR <0.7 11/14/2016   LDLCALC 90 02/02/2017   CREATININE 1.17  03/30/2017   Lab Results  Component Value Date   MICRALBCREAT 1.8 11/14/2016       Allergies as of 05/05/2017   No Known Allergies     Medication List       Accurate as of 05/05/17 11:59 PM. Always use your most recent med list.          acetaminophen 325 MG tablet Commonly known as:  TYLENOL Take 2 tablets (650 mg total) by mouth every 6 (six) hours as needed for moderate pain or headache.   aspirin EC 81 MG tablet Take 81 mg by mouth daily.   B-D SINGLE USE SWABS REGULAR Pads Use to clean finger before checking blood sugars and clean skin before insulin injections.   buPROPion 150 MG 24 hr tablet Commonly known as:  WELLBUTRIN XL 150 mg. Take one tablet daily in the morning   feeding supplement (GLUCERNA SHAKE) Liqd Take 237 mLs by mouth 3 (three) times daily between meals.   fluticasone 50 MCG/ACT nasal spray Commonly  known as:  FLONASE Place 2 sprays into both nostrils daily.   glimepiride 2 MG tablet Commonly known as:  AMARYL Take 1 tablet (2 mg total) by mouth 2 (two) times daily.   glucose blood test strip Commonly known as:  ACCU-CHEK GUIDE USE TO TEST BLOOD SUGAR 1 TIME DAILY- DX CODE E11.8   guaiFENesin 600 MG 12 hr tablet Commonly known as:  MUCINEX Take 1 tablet (600 mg total) by mouth 2 (two) times daily.   insulin aspart 100 UNIT/ML FlexPen Commonly known as:  NOVOLOG FLEXPEN Inject 4 units before breakfast and 4 units before supper   insulin degludec 100 UNIT/ML Sopn FlexTouch Pen Commonly known as:  TRESIBA Inject 6 units into the skin once daily   Insulin Pen Needle 32G X 4 MM Misc Commonly known as:  BD PEN NEEDLE NANO U/F Use to inject insulin once daily   isosorbide mononitrate 30 MG 24 hr tablet Commonly known as:  IMDUR 30 mg. One tablet daily   levocetirizine 5 MG tablet Commonly known as:  XYZAL Take 5 mg by mouth every evening.   lisinopril 5 MG tablet Commonly known as:  PRINIVIL,ZESTRIL Take 5 mg by mouth daily.   lovastatin 20 MG tablet Commonly known as:  MEVACOR Take 20 mg by mouth at bedtime.   meclizine 12.5 MG tablet Commonly known as:  ANTIVERT Take 1 tablet (12.5 mg total) by mouth 2 (two) times daily.   metoprolol tartrate 25 MG tablet Commonly known as:  LOPRESSOR Take 25 mg by mouth 2 (two) times daily.   multivitamin-lutein Caps capsule Take 1 capsule by mouth daily.   CENTRUM SILVER 50+MEN Tabs Take 1 tablet by mouth daily.   nitroGLYCERIN 0.3 MG SL tablet Commonly known as:  NITROSTAT Place 0.3 mg under the tongue every 5 (five) minutes as needed for chest pain.   pantoprazole 40 MG tablet Commonly known as:  PROTONIX Take 40 mg by mouth daily.   PROBIOTIC FORMULA Caps Take 1 capsule by mouth daily.   Turmeric Curcumin 500 MG Caps Take 1 capsule by mouth daily.   TURMERIC CURCUMIN PO Take by mouth.   vitamin C 1000 MG  tablet Take 1,000 mg by mouth daily.       Allergies: No Known Allergies  Past Medical History:  Diagnosis Date  . Anxiety   . Arthritis    "all over"  . Breast cancer ([place])    "both breasts"  . Chronic pancreatitis (  HCC)   . Daily headache   . Depression   . Dizziness   . GERD (gastroesophageal reflux disease)   . Giddiness   . History of hiatal hernia   . Hyperlipemia breast cancer  . Hypertension   . IBS (irritable bowel syndrome)   . MI (myocardial infarction) ([place])   . Skin cancer of face    "don't know what kind"  . Type II diabetes mellitus ([place])     Past Surgical History:  Procedure Laterality Date  . BREAST SURGERY    . CATARACT EXTRACTION W/ INTRAOCULAR LENS  IMPLANT, BILATERAL Bilateral   . CHOLECYSTECTOMY OPEN  1970's  . MASTECTOMY Bilateral     No family history on file.  Social History:  reports that she has never smoked. She has never used smokeless tobacco. She reports that she does not drink alcohol or use drugs.   Review of Systems   Lipid history:  She is on lovastatin With the following results    Lab Results  Component Value Date   CHOL 163 02/02/2017   HDL 48.70 02/02/2017   LDLCALC 90 02/02/2017   TRIG 120.0 02/02/2017   CHOLHDL 3 02/02/2017           Hypertension: Mild and blood pressure is normal  She is on metoprolol 25 mg, Has history of CAD  HYPERKALEMIA: She was told to restrict her high potassium foods Appears that she is still taking lisinopril described by PCP and has not had a follow-up She is also eating certain foods like cantaloupes more regularly now   Lab Results  Component Value Date   CREATININE 1.17 03/30/2017   BUN 35 (H) 03/30/2017   NA 134 (L) 03/30/2017   K 5.6 (H) 03/30/2017   CL 101 03/30/2017   CO2 28 03/30/2017    Most recent eye exam was 07/2016, reportedly no retinopathy  Most recent foot exam: 11/17     Physical Examination:  BP 122/62   Pulse 67   Ht 4\' 11"  (1.499 m)   Wt 113  lb 12.8 oz (51.6 kg)   SpO2 96%   BMI 22.98 kg/m        ASSESSMENT:  Diabetes type 2, uncontrolled, nonobese  See history of present illness for detailed discussion of current diabetes management, blood sugar patterns and problems identified  Her blood sugars have been overall generally poorly controlled although difficult to assess because of her checking only fasting readings  She is currently taking only low-dose insulin with [name] and Humalog at supper which is her main meal Today has a reading of over 250 after lunch Most likely needs to be on mealtime insulin at least twice a day She is currently comfortable doing injections on her own and she thinks she is compliant with these Her last A1c was 8.9 month ago  300 after  With 10 units of Tresiba insulin fasting readings are excellent and averaging 132 recently with occasional low normal readings also She is compliant with injections at night She was started on Humalog at suppertime which she thinks she is taking regularly However she has not done any blood sugars after supper in the last 2 weeks and not clear if she still has readings over 200 at night She is still not understanding the differences between the 2 insulins and she has 2 family members assisting her today  She is only on  Amaryl as an oral agent as she cannot afford brand name drugs that have been prescribed  and she reports side effects from metformin She appears to be insulin dependent and should get good control with low-dose basal bolus insulin regimen  History of hyperkalemia:  since she is not following up with her PCP will for now stop her lisinopril and recheck on the next visit  PLAN:    She will need to start checking her blood sugars after supper And some after breakfast regularly  She does not need to check in the morning every day  She will take Humalog before breakfast and she will increase both morning and evening doses up to 4 units  Also  will need to change her prescription to Novolog because insurance does not cover Humalog now  Will continue Tresiba unchanged since she has had readings as low as 129 over the last month   Explained the technique of injection again  She needs to remove her pen needle right after her injection  Discussed blood sugar targets after meals  She needs to be avoiding cereal except occasionally and have a protein with every meal  A1c on the next visit  Continue Amaryl for now  Follow-up scheduled  Written instructions for glucose monitoring, insulin timing, blood sugar targets were reviewed with the patient  Given names for the PCP office and she will establish with a new PCP which she desired  Patient Instructions  HUMALOG/GRAY PEN WILL BE BEFORE BREAKFAST AND BEFORE SUPPER AND TAKE 4 UNITS  TRESIBA SAME AT 8 UNITS   NO Cereal at lunch. Have protein at each meal  STOP LISINOPRIL  Avoid cantouple;  oranges and bananas  Must CHECK SUGAR AT BEDTIME EVERY 2 DAYS   Counseling time on subjects discussed above is over 50% of today's 25 minute visit    Indiyah Paone 05/06/2017, 12:27 PM   Note: This office note was prepared with Estate agent. Any transcriptional errors that result from this process are unintentional.

## 2017-05-05 NOTE — Patient Instructions (Addendum)
HUMALOG/GRAY PEN WILL BE BEFORE BREAKFAST AND BEFORE SUPPER AND TAKE 4 UNITS  TRESIBA SAME AT 8 UNITS   NO Cereal at lunch. Have protein at each meal  STOP LISINOPRIL  Avoid cantouple;  oranges and bananas  Must CHECK SUGAR AT BEDTIME EVERY 2 DAYS

## 2017-05-05 NOTE — Telephone Encounter (Signed)
Novolog ok.

## 2017-05-05 NOTE — Telephone Encounter (Signed)
[place] is calling to advise that humalog is not a covered drug for patients formulary. She states that novalog is covered. Please correct and resubmit.

## 2017-05-06 ENCOUNTER — Ambulatory Visit: Payer: Medicare HMO | Admitting: Endocrinology

## 2017-05-07 NOTE — Telephone Encounter (Signed)
Spoke with patient and explained the note below- I also contacted her daughter to explain the note to her patient seemed confused and irritated

## 2017-05-07 NOTE — Telephone Encounter (Signed)
I spoke to the patient and she is very upset because she can not get control of her blood sugar and she is having trouble eating and sleeping. Patient states she can not afford Novolog and the Humalog is not working her blood sugar is running consistently high every day ranges between 200-300 especially in the mornings- patient states that she feels the shots are killing her she can't eat or sleep and she is not able to live her life. Patient would like to know if would be possible to come off of the shots and only treat with pills instead- patient stated she did not have this issue when she was on metformin and would like to try it again or whatever pill she can take instead of doing shots

## 2017-05-07 NOTE — Telephone Encounter (Signed)
Patient called very frustrated that her medicine (would not tell me which one) was not working and that [employer] is too expensive.   Insurance is [employer] and she did not call [employer] due to it taking 7-10 days to deliver. Patient still has 3-4 pills left and wants Humolog.   Patient wants the nurse to call [employer] to get the Humolog.   Call patient to advise.

## 2017-05-07 NOTE — Telephone Encounter (Signed)
We have looked at her blood sugars and her sugars are not over 200 Pills will not work at this stage for her diabetes She needs to tell me what her blood sugars are 2-3 hours after breakfast and dinner and we can adjust her Humalog or NovoLog. If she wants a less expensive insulin instead of NovoLog she will have to find out if Humulin R with the [facility] pen will be less expensive otherwise she will like to use a syringe for the [employer] insulin

## 2017-05-11 NOTE — Telephone Encounter (Signed)
Spoke with the patient this morning and she is stating that she is very sick from taking the injectable insulin she does not feel like going go out can not sleep feeling lousy tired and sick all of the time loss of appetite and losing weight- meter is not working so she can not test her blood sugars she is waiting for new meter to be delivered- patient stated she went to take her humalog yesterday and it was empty so she was unable start the humalog until today- patient seems very confused about what she is taking and what she is supposed to taking- have tried a few times to reach out to her daughter but have been unsuccessful will try again later today

## 2017-05-11 NOTE — Telephone Encounter (Signed)
Yes Vaughan Basta I spoke to Ms. Blackshire at great length on Friday and as stated in the note below the patient seemed very confused and aggitated- patient does not want to do the injectable insulins anymore because she gets the different ones confused and she does not belive they are working for her the way they should. I have tried to reaach out to the patients daughter and have left her 2 voice maild requesting a call back to discuss her mothers issues with her current medications

## 2017-05-11 NOTE — Telephone Encounter (Signed)
Message left on my machine Friday (no time noted), that her phone range, but no one left a message, and was afraid she missed a call from me???.  Said blood sugar was 289 last night and this morning was 240 fasting.  Michela Pitcher will run out of insulin today.  Please call back.

## 2017-05-11 NOTE — Telephone Encounter (Signed)
Patient's daughter [name] returning phone call. Okay to leave a detailed message on phone.

## 2017-05-12 ENCOUNTER — Telehealth: Payer: Self-pay

## 2017-05-12 NOTE — Telephone Encounter (Signed)
Patient going to start Humalog and test her blood sugars for a few days and see how it goes if she has any problems she will call back

## 2017-05-12 NOTE — Telephone Encounter (Signed)
Please have her call primary care at [facility] for appointment with PCP ASAP

## 2017-05-12 NOTE — Telephone Encounter (Signed)
Spoke to the patient and she stated she told the doctor she does not like her PCP and she also stated the doctor told her about another PCP but she has not had a chance to call them yet and now she does not remember the name- I did advise her of the note below and she informed me that she does have a new meter and will check her blood sugar tonight- patient states all of her symptoms did not start until she started taking shots and she feels the symptoms are because of the diabetes

## 2017-05-12 NOTE — Telephone Encounter (Signed)
Patient states she feels like she is dieing thinks her blood sugar is high but does not know because she can not test blood sugar her meter is broken- she is crying can not concentrate feels terrible- patient states she is scared she has never felt this way and would like to come in and see Dr. Dwyane Dee tomorrow- anyway that we can get her in tomorrow please advise

## 2017-05-12 NOTE — Telephone Encounter (Signed)
We can call in another prescription for a replacement glucose monitor. She really needs to be seen by a primary care physician since her problems are not related to the diabetes, we do not have any openings

## 2017-05-14 ENCOUNTER — Other Ambulatory Visit: Payer: Self-pay

## 2017-05-14 NOTE — Telephone Encounter (Signed)
Spoke with the patient and she is feeling to weak and she states she does not care anymore- feels her sugar has gone down a little but she still just does not feel good- no energy, stomach hurts, and having trouble with her ears- I advised her to call and make an appointment with PCP at [facility] and she stated she just does not have the energy I will be contacting the daughter to see if there is any way for her to go and help her mother get in to see PCP

## 2017-05-14 NOTE — Patient Outreach (Signed)
[facility]) Care Management  05/14/2017  Vanessa Gibson Oct 02, 1928 [id number]  TELEPHONE SCREENING Referral date: 05/12/17 Referral source: Nurse call line Referral reason: New prescription/ insulin. Feels weak and lacks support Insurance:   Telephone call to patient regarding nurse call line referral. Unable to reach patient. HIPAA compliant voice message left with call back phone number.  PLAN: RNCM will attempt 2nd telephone call within 3 business days.  Quinn Plowman RN,BSN,CCM [facility] Telephonic  [phone]

## 2017-05-15 ENCOUNTER — Other Ambulatory Visit: Payer: Self-pay

## 2017-05-15 ENCOUNTER — Encounter: Payer: Self-pay | Admitting: *Deleted

## 2017-05-15 NOTE — Patient Outreach (Signed)
[facility]) Care Management  05/15/2017  Vanessa Gibson 1927-12-01 [id number]  NURSE CALL LINE follow up [place] Referral date: 05/12/17  Referral source: Nurse call line Referral reason: Patients needs RN to call her.  Patient is new to town, new insulin prescription for diabetes. Patient feels weak, lacks support. Insurance: [employer]  SUBJECTIVE: Telephone call to patient regarding nurse call line follow up. HIPAA verified with patient. Patient states she does not have a primary MD. Patient states she had primary MD provider but does not care for them. Patient states she is new to the area. Reports she currently resides at [facility].  Patient states she feels she does not have a lot of support. Patient states she has been diabetic for approximately 30 years. Patient states her diabetes has been managed with diet and oral medication. Patient states approximately 1 month ago she was put on insulin. States she takes two insulin medications now, [name] and [employer]. Patient states the insulin is expensive and she is having a hard time affording. Patient states her blood sugars have been ranging from 60's to 300's. Patient unsure of her A1c.  Patient states she has seen Dr. Dwyane Dee for her diabetes. Patient unsure if she has a follow up appointment. Patient states since she started taking the insulin she falls asleep all of the time. Patient states that has never happened to her before.   Patient reports today's fasting insulin is 157.  Patient states she also has problems with her stomach and reflux. Patient denies having a gastroenterologist currently. RNCM discussed and offered [facility] care management services to patient. Patient verbally agreed to services.  RNCM advised patient to notify MD of any changes in condition prior to scheduled appointment. RNCM provided contact name and number: main office number [phone] and 24 hour nurse advise line [phone].  RNCM  verified patient aware of 911 services for urgent/ emergent needs.  ASSESSMENT: Patient will benefit from nursing follow up due to being newly insulin dependent for her diabetes.  Patient will benefit from pharmacy follow up due to concern of side effect from medication. Patients PHQ2 score: 3 and PHQ9 score: 21 Patient will benefit from follow up with social worker.   PROVIDERS: Dr. Dwyane Dee  SOCIAL/ SUPPORTIVE CARE: Patient lives alone at [place]: [facility] will refer patient to community case manager, social worker and pharmacist.   Quinn Plowman RN,BSN,CCM [facility] Telephonic  [phone]

## 2017-05-18 ENCOUNTER — Other Ambulatory Visit: Payer: Self-pay

## 2017-05-18 ENCOUNTER — Encounter: Payer: Self-pay | Admitting: *Deleted

## 2017-05-18 ENCOUNTER — Other Ambulatory Visit: Payer: Self-pay | Admitting: *Deleted

## 2017-05-18 NOTE — Patient Outreach (Signed)
[facility]) Care Management  05/18/17  Carolin Quang Mar 04, 1928 [id number]  New referral from telephonic on 05/15/2017 for diabetes.  Initial attempt to reach patient without success. Left HIPAA compliant voicemail with RNCM contact information and invited callback.  [place] R. Maezie Justin, RN, BSN, [place] Management Coordinator [phone]

## 2017-05-18 NOTE — Patient Outreach (Signed)
[facility] [facility]) Care Management  05/18/2017  Vanessa Gibson Jan 04, 1928 [id number]   CSW was able to make initial contact with patient today to perform phone assessment, as well as assess and assist with social work needs and services.  CSW introduced self, explained role and types of services provided through [place] [facility] ([place] [facility]).  CSW further explained to patient that CSW works with patient's RNCM, also with [place] [facility], Quinn Plowman. CSW then explained the reason for the call, indicating that Mrs. Nyoka Cowden thought that patient would benefit from social work services and resources to assist with understanding symptoms of depression, as well as consider undergoing various treatment options.  CSW obtained two HIPAA compliant identifiers from patient, which included patient's name and date of birth. Patient admitted that she needed counseling at one point in her life; however, she is no longer interested in undergoing psychotropic medications and/or psychotherapy.  CSW offered counseling and supportive services, where appropriate. Patient was somewhat interested in receiving literature regarding signs and symptoms of depression, but did not wish to received a referral for services.  Nor did patient wish for CSW to notify her primary care physician to see about starting her on a psychotropic medication.  Patient was agreeable to receiving literature in the mail, and then having CSW follow-up with her in a few weeks. Vanessa Gibson, BSW, MSW, LCSW  Licensed Education officer, [facility]  Mailing [place] [address], [place], [place] [address] Physical Address-[address], [place], [place] [address] Toll Free Main # [phone] Fax # [phone] Cell # [phone]  Office # [phone] [email or web address]

## 2017-05-19 ENCOUNTER — Other Ambulatory Visit: Payer: Self-pay | Admitting: Pharmacist

## 2017-05-19 NOTE — Patient Outreach (Signed)
[facility] [facility]) Care Management  05/19/2017  Vanessa Gibson 06-09-1928 [id number]   Patient was called per referral regarding medication assistance. HIPAA identifiers were obtained. After explaining who I was and the purpose of the call, the patient said she did not think she could talk to me. She asked me to call Durene Fruits (her son-in-law) to discuss what I was calling about in more detail.  Durene Fruits was listed as an emergency contact and she gave me permission to call.  I called Durene Fruits who said he was in the middle of something at the time and asked if he could call me back tomorrow .     Plan:  1.  Await a call back from [place].  2.  Call patient back in 3-5 business days if I do not hear back.   [name], PharmD, [place] Clinical Pharmacist [phone]

## 2017-05-20 ENCOUNTER — Other Ambulatory Visit: Payer: Self-pay | Admitting: Pharmacist

## 2017-05-20 ENCOUNTER — Ambulatory Visit: Payer: Self-pay

## 2017-05-20 NOTE — Patient Outreach (Signed)
[place] [facility]) Care Management  05/20/2017  Vanessa Gibson 11/22/1928 [id number]   Patient's son-in-law and emergency contact, [name] called me back today.  HIPAA identifiers were obtained.  Medication Assistance was reviewed and a home visit was scheduled with the patient and her son-in-law on May 26, 2017 at 1:00pm  [name], PharmD, [place] Clinical Pharmacist [phone]

## 2017-05-22 ENCOUNTER — Ambulatory Visit: Payer: Medicare HMO | Admitting: Pharmacist

## 2017-05-25 ENCOUNTER — Ambulatory Visit (INDEPENDENT_AMBULATORY_CARE_PROVIDER_SITE_OTHER): Payer: Medicare HMO | Admitting: Family Medicine

## 2017-05-25 ENCOUNTER — Encounter: Payer: Self-pay | Admitting: Family Medicine

## 2017-05-25 VITALS — BP 130/64 | HR 71 | Temp 98.5°F | Ht 59.0 in | Wt 114.8 lb

## 2017-05-25 DIAGNOSIS — K861 Other chronic pancreatitis: Secondary | ICD-10-CM | POA: Diagnosis not present

## 2017-05-25 DIAGNOSIS — E119 Type 2 diabetes mellitus without complications: Secondary | ICD-10-CM | POA: Diagnosis not present

## 2017-05-25 DIAGNOSIS — N183 Chronic kidney disease, stage 3 unspecified: Secondary | ICD-10-CM

## 2017-05-25 DIAGNOSIS — H9193 Unspecified hearing loss, bilateral: Secondary | ICD-10-CM

## 2017-05-25 DIAGNOSIS — R32 Unspecified urinary incontinence: Secondary | ICD-10-CM | POA: Diagnosis not present

## 2017-05-25 DIAGNOSIS — R202 Paresthesia of skin: Secondary | ICD-10-CM | POA: Diagnosis not present

## 2017-05-25 DIAGNOSIS — E43 Unspecified severe protein-calorie malnutrition: Secondary | ICD-10-CM | POA: Diagnosis not present

## 2017-05-25 DIAGNOSIS — R42 Dizziness and giddiness: Secondary | ICD-10-CM | POA: Diagnosis not present

## 2017-05-25 DIAGNOSIS — R5383 Other fatigue: Secondary | ICD-10-CM | POA: Diagnosis not present

## 2017-05-25 DIAGNOSIS — F339 Major depressive disorder, recurrent, unspecified: Secondary | ICD-10-CM | POA: Diagnosis not present

## 2017-05-25 DIAGNOSIS — L989 Disorder of the skin and subcutaneous tissue, unspecified: Secondary | ICD-10-CM

## 2017-05-25 LAB — CBC WITH DIFFERENTIAL/PLATELET
Basophils Absolute: 0.1 10*3/uL (ref 0.0–0.1)
Basophils Relative: 0.7 % (ref 0.0–3.0)
Eosinophils Absolute: 0.3 10*3/uL (ref 0.0–0.7)
Eosinophils Relative: 3.7 % (ref 0.0–5.0)
HCT: 34.7 % — ABNORMAL LOW (ref 36.0–46.0)
Hemoglobin: 11.9 g/dL — ABNORMAL LOW (ref 12.0–15.0)
Lymphocytes Relative: 31.3 % (ref 12.0–46.0)
Lymphs Abs: 2.6 10*3/uL (ref 0.7–4.0)
MCHC: 34.2 g/dL (ref 30.0–36.0)
MCV: 95.7 fl (ref 78.0–100.0)
Monocytes Absolute: 0.7 10*3/uL (ref 0.1–1.0)
Monocytes Relative: 8.2 % (ref 3.0–12.0)
Neutro Abs: 4.7 10*3/uL (ref 1.4–7.7)
Neutrophils Relative %: 56.1 % (ref 43.0–77.0)
Platelets: 232 10*3/uL (ref 150.0–400.0)
RBC: 3.63 Mil/uL — ABNORMAL LOW (ref 3.87–5.11)
RDW: 12.5 % (ref 11.5–15.5)
WBC: 8.3 10*3/uL (ref 4.0–10.5)

## 2017-05-25 LAB — COMPREHENSIVE METABOLIC PANEL
ALT: 13 U/L (ref 0–35)
AST: 18 U/L (ref 0–37)
Albumin: 4 g/dL (ref 3.5–5.2)
Alkaline Phosphatase: 31 U/L — ABNORMAL LOW (ref 39–117)
BUN: 33 mg/dL — ABNORMAL HIGH (ref 6–23)
CO2: 27 mEq/L (ref 19–32)
Calcium: 9.3 mg/dL (ref 8.4–10.5)
Chloride: 104 mEq/L (ref 96–112)
Creatinine, Ser: 1.24 mg/dL — ABNORMAL HIGH (ref 0.40–1.20)
GFR: 43.53 mL/min — ABNORMAL LOW (ref >60.00)
Glucose, Bld: 151 mg/dL — ABNORMAL HIGH (ref 70–99)
Potassium: 4.9 mEq/L (ref 3.5–5.1)
Sodium: 138 mEq/L (ref 135–145)
Total Bilirubin: 0.3 mg/dL (ref 0.2–1.2)
Total Protein: 6.8 g/dL (ref 6.0–8.3)

## 2017-05-25 LAB — VITAMIN B12: Vitamin B-12: 711 pg/mL (ref 211–911)

## 2017-05-25 LAB — TSH: TSH: 1.39 u[IU]/mL (ref 0.35–4.50)

## 2017-05-25 MED ORDER — MIRTAZAPINE 15 MG PO TABS: 15.0000 mg | ORAL_TABLET | Freq: Every day | ORAL | 2 refills | Status: DC

## 2017-05-25 NOTE — Progress Notes (Addendum)
Vanessa Gibson is a 83 y.o. female is here to [employer].   Patient Care Team: Briscoe Deutscher, DO as PCP - General (Family Medicine) Dannielle Karvonen, RN as [place] [facility] [place], Maree Erie, LCSW as [place] Management Luciana Axe, Angelia Mould, [facility] as [place] Management (Pharmacist) Nickola Major, Bill Salinas, RN as [place] [facility]   History of Present Illness:   Shaune Pascal CMA acting as scribe for Dr. Juleen China.  HPI: SEE A/P FOR PROBLEM BASED CHARTING.  Depression screen PHQ 2/9 05/18/2017  Decreased Interest 2  Down, Depressed, Hopeless 2  PHQ - 2 Score 4  Altered sleeping 3  Tired, decreased energy 3  Change in appetite 2  Feeling bad or failure about yourself  2  Trouble concentrating 3  Moving slowly or fidgety/restless 1  Suicidal thoughts 0  PHQ-9 Score 18  Difficult doing work/chores Very difficult   Health Maintenance Due  Topic Date Due  . OPHTHALMOLOGY EXAM  10/22/1940  . TETANUS/TDAP  10/22/1949  . DEXA SCAN  10/23/1995  . PNA vac Low Risk Adult (1 of 2 - PCV13) 10/23/1995   Immunization History  Administered Date(s) Administered  . Influenza Whole 08/03/2016  . Influenza, High Dose Seasonal PF 08/09/2015   PMHx, SurgHx, SocialHx, Medications, and Allergies were reviewed in the Visit Navigator and updated as appropriate.   Past Medical History:  Diagnosis Date  . Anxiety   . Arthritis   . Breast cancer ([place]), bilateral   . Chronic pancreatitis ([place])   . Daily headache   . Depression   . Dizziness   . GERD (gastroesophageal reflux disease)   . Giddiness   . History of hiatal hernia   . Hyperlipemia   . Hypertension   . IBS (irritable bowel syndrome)   . Mandibular fracture ([place]) 12/04/2015  . MI (myocardial infarction) ([place])   . Skin cancer of face   . Type II diabetes mellitus ([place])    Past Surgical History:  Procedure Laterality Date  . BREAST SURGERY      . CATARACT EXTRACTION W/ INTRAOCULAR LENS  IMPLANT, BILATERAL Bilateral   . CHOLECYSTECTOMY OPEN  1970's  . MASTECTOMY Bilateral    No family history on file.   Social History  Substance Use Topics  . Smoking status: Never Smoker  . Smokeless tobacco: Never Used  . Alcohol use No   Social History Narrative   Lives at [facility], [place].    Current Medications and Allergies:   .  Ascorbic Acid (VITAMIN C) 1000 MG tablet, Take 1,000 mg by mouth daily., Disp: , Rfl:  .  aspirin EC 81 MG tablet, Take 81 mg by mouth daily., Disp: , Rfl:  .  buPROPion (WELLBUTRIN XL) 150 MG 24 hr tablet, 150 mg. Take one tablet daily in the morning, Disp: , Rfl:  .  GLUCERNA SHAKE, (GLUCERNA SHAKE) LIQD, Take 237 mLs by mouth 3 (three) times daily between meals., Disp: , Rfl: 0 .  fluticasone (FLONASE) 50 MCG/ACT nasal spray, Place 2 sprays into both nostrils daily., Disp: , Rfl:  .  glimepiride (AMARYL) 2 MG tablet, Take 1 tablet (2 mg total) by mouth 2 (two) times daily., Disp: 180 tablet, Rfl: 1 .  guaiFENesin (MUCINEX) 600 MG 12 hr tablet, Take 1 tablet (600 mg total) by mouth 2 (two) times daily., Disp: 14 tablet, Rfl: 0 .  insulin aspart (NOVOLOG FLEXPEN) 100 UNIT/ML FlexPen, Inject 4 units before breakfast  and 4 units before supper, Disp: 5 pen,  .  insulin degludec (TRESIBA) 100 UNIT/ML SOPN FlexTouch Pen, Inject 8 Units. Inject 6 units into the skin once daily) .  Insulin Pen Needle (BD PEN NEEDLE NANO U/F) 32G X 4 MM MISC, Use to inject insulin once daily, Disp: 90 each, Rfl: 2 .  isosorbide mononitrate (IMDUR) 30 MG 24 hr tablet, 30 mg. One tablet daily, Disp: , Rfl:  .  levocetirizine (XYZAL) 5 MG tablet, Take 5 mg by mouth every evening., Disp: , Rfl:  .  lovastatin (MEVACOR) 20 MG tablet, Take 20 mg by mouth at bedtime., Disp: , Rfl:  .  meclizine (ANTIVERT) 12.5 MG tablet, Take 1 tablet (12.5 mg total) by mouth 2 (two) times daily., Disp: 30 tablet, Rfl: 0 .  metoprolol  tartrate (LOPRESSOR) 25 MG tablet, Take 25 mg by mouth 2 (two) times daily., Disp: , Rfl:  .  Multiple Vitamins-Minerals (CENTRUM SILVER 50+MEN) TABS, Take 1 tablet by mouth daily., Disp: , Rfl:  .  multivitamin-lutein (OCUVITE-LUTEIN) CAPS capsule, Take 1 capsule by mouth daily., Disp: , Rfl:  .  nitroGLYCERIN (NITROSTAT) 0.3 MG SL tablet, Place 0.3 mg under the tongue every 5 (five) minutes as needed for chest pain. .  pantoprazole (PROTONIX) 40 MG tablet, Take 40 mg by mouth daily., Disp: , Rfl:  .  Probiotic Product (PROBIOTIC FORMULA) CAPS, Take 1 capsule by mouth daily., Disp: , Rfl:  .  Turmeric Curcumin 500 MG CAPS, Take 1 capsule by mouth daily., Disp: , Rfl:   No Known Allergies   Review of Systems:   Review of Systems  All other systems reviewed and are negative.  Vitals:   Vitals:   05/25/17 1405  BP: 130/64  Pulse: 71  Temp: 98.5 F (36.9 C)  TempSrc: Oral  SpO2: 96%  Weight: 114 lb 12.8 oz (52.1 kg)  Height: 4\' 11"  (1.499 m)     Body mass index is 23.19 kg/m.  Physical Exam:   Physical Exam  Constitutional: She appears well-developed and well-nourished. No distress.  HENT:  Head: Normocephalic and atraumatic.  Right Ear: External ear normal.  Left Ear: External ear normal.  Nose: Nose normal.  Mouth/Throat: Oropharynx is clear and moist.  Eyes: Conjunctivae and EOM are normal. Pupils are equal, round, and reactive to light.  Neck: Normal range of motion. Neck supple. No thyromegaly present.  Cardiovascular: Normal rate, regular rhythm, normal heart sounds and intact distal pulses.   Pulmonary/Chest: Effort normal and breath sounds normal.  Abdominal: Soft. Bowel sounds are normal.  Neurological: She is alert. She displays normal reflexes. No cranial nerve deficit or sensory deficit. She exhibits normal muscle tone. Coordination normal.  Skin: Skin is warm.  Psychiatric: She has a normal mood and affect. Her behavior is normal.  Nursing note and vitals  reviewed.  Results for orders placed or performed in visit on 05/25/17  CBC with Differential/Platelet  Result Value Ref Range   WBC 8.3 4.0 - 10.5 K/uL   RBC 3.63 (L) 3.87 - 5.11 Mil/uL   Hemoglobin 11.9 (L) 12.0 - 15.0 g/dL   HCT 34.7 (L) 36.0 - 46.0 %   MCV 95.7 78.0 - 100.0 fl   MCHC 34.2 30.0 - 36.0 g/dL   RDW 12.5 11.5 - 15.5 %   Platelets 232.0 150.0 - 400.0 K/uL   Neutrophils Relative % 56.1 43.0 - 77.0 %   Lymphocytes Relative 31.3 12.0 - 46.0 %   Monocytes Relative 8.2 3.0 -  12.0 %   Eosinophils Relative 3.7 0.0 - 5.0 %   Basophils Relative 0.7 0.0 - 3.0 %   Neutro Abs 4.7 1.4 - 7.7 K/uL   Lymphs Abs 2.6 0.7 - 4.0 K/uL   Monocytes Absolute 0.7 0.1 - 1.0 K/uL   Eosinophils Absolute 0.3 0.0 - 0.7 K/uL   Basophils Absolute 0.1 0.0 - 0.1 K/uL  Comprehensive metabolic panel  Result Value Ref Range   Sodium 138 135 - 145 mEq/L   Potassium 4.9 3.5 - 5.1 mEq/L   Chloride 104 96 - 112 mEq/L   CO2 27 19 - 32 mEq/L   Glucose, Bld 151 (H) 70 - 99 mg/dL   BUN 33 (H) 6 - 23 mg/dL   Creatinine, Ser 1.24 (H) 0.40 - 1.20 mg/dL   Total Bilirubin 0.3 0.2 - 1.2 mg/dL   Alkaline Phosphatase 31 (L) 39 - 117 U/L   AST 18 0 - 37 U/L   ALT 13 0 - 35 U/L   Total Protein 6.8 6.0 - 8.3 g/dL   Albumin 4.0 3.5 - 5.2 g/dL   Calcium 9.3 8.4 - 10.5 mg/dL   GFR 43.53 (L) >60.00 mL/min  TSH  Result Value Ref Range   TSH 1.39 0.35 - 4.50 uIU/mL  Vitamin B12  Result Value Ref Range   Vitamin B-12 711 211 - 911 pg/mL   Assessment and Plan:   Brinlynn was seen today for establish care.  Diagnoses and all orders for this visit:  Depression [facility]) Comments: Worsened - now severe. Not sleeping well. Poor food intake per report. Not participating in exercise anymore. Eats 1-2 meals per day, though offered at facility. Orders: -     mirtazapine (REMERON) 15 MG tablet; Take 1 tablet (15 mg total) by mouth at bedtime.  Type 2 diabetes mellitus without complication, without long-term current use of  insulin (HCC) Comments: Current symptoms: no polyuria or polydipsia, no chest pain, dyspnea or TIA's.  Taking medication compliantly without noted sided effects [x]   YES  []   NO  Episodes of hypoglycemia? []   YES  [x]   NO Maintaining a diabetic diet? []   YES  [x]   NO Trying to exercise on a regular basis? []   YES  [x]   NO  On ACE inhibitor or angiotensin II receptor blocker? []   YES  [x]   NO On Aspirin? [x]   YES  []   NO  Lab Results  Component Value Date   HGBA1C 8.9 (H) 03/30/2017    Lab Results  Component Value Date   MICROALBUR <0.7 11/14/2016    Lab Results  Component Value Date   CHOL 163 02/02/2017   HDL 48.70 02/02/2017   LDLCALC 90 02/02/2017   TRIG 120.0 02/02/2017   CHOLHDL 3 02/02/2017     Wt Readings from Last 3 Encounters:  05/25/17 114 lb 12.8 oz (52.1 kg)  05/05/17 113 lb 12.8 oz (51.6 kg)  03/06/17 112 lb (50.8 kg)   Reports that she has never smoked. She has never used smokeless tobacco.  BP Readings from Last 3 Encounters:  05/25/17 130/64  05/05/17 122/62  03/06/17 (!) 112/52   Lab Results  Component Value Date   CREATININE 1.24 (H) 05/25/2017   Orders: -     CBC with Differential/Platelet -     Comprehensive metabolic panel -     Ambulatory referral to Podiatry  Fatigue, unspecified type Orders: -     CBC with Differential/Platelet -     Comprehensive metabolic panel -  TSH -     Vitamin B12  Facial lesion Comments: Concerning that lesions are non-healing. Hx of "facial cancer." To Dermatology urgently.  Orders: -     Ambulatory referral to Dermatology  Bilateral hearing loss, unspecified hearing loss type Comments: Declined hearing screening today.  Paresthesias Comments: B12 WNL. A1c 6.8 - 9.2 in the last year.  Urinary incontinence, unspecified type Comments: Requesting records from Urology.  Dizziness Comments: Hx of vertigo. Describes the room spinning when she turns her head too quickly or washes her hair. Has  started taking Meclizine BID scheduled. Neurological ROS: negative for - behavioral changes, bowel and bladder control changes, confusion, headaches, seizures, speech problems, tremors, visual changes or weakness. Cardiovascular ROS: negative for - edema, irregular heartbeat, loss of consciousness, palpitations or shortness of breath. PLAN: Hold Meclizine as she is scheduling it and I worry that it is paradoxically causing oversedation/dizziness. Offered vestibular rehab.  CKD (chronic kidney disease) stage 3, GFR 30-59 ml/min Comments:  Not on an ACE or ARB.  Lab Results  Component Value Date   CREATININE 1.24 (H) 05/25/2017   CREATININE 1.17 03/30/2017   CREATININE 1.21 (H) 02/02/2017   Protein-calorie malnutrition, severe Comments: BMI 23.19 today. Weight is stable. Will see if Remeron stimulates appetite.    . Reviewed expectations re: course of current medical issues. . Discussed self-management of symptoms. . Outlined signs and symptoms indicating need for more acute intervention. . Patient verbalized understanding and all questions were answered. [facility] Health Maintenance issues including appropriate healthy diet, exercise, and smoking avoidance were discussed with patient. . See orders for this visit as documented in the electronic medical record. . Patient received an After Visit Summary.  CMA served as Education administrator during this visit. History, Physical, and Plan performed by medical provider. The above documentation has been reviewed and is accurate and complete. Briscoe Deutscher, D.O.  Records requested if needed. Time spent with the patient: 60 minutes, of which >50% was spent in obtaining information about her symptoms, reviewing her previous labs, evaluations, and treatments, counseling her about her condition (please see the discussed topics above), and developing a plan to further investigate it; she had a number of questions which I addressed.   Briscoe Deutscher, DO [place], [place] 05/28/2017  Future Appointments Date Time Provider [place]  06/04/2017 8:30 AM [place], Maree Erie, LCSW [facility] None  06/05/2017 9:00 AM [name], RPH [facility] None  06/09/2017 1:00 PM Loletta Specter, RN [facility] None  07/28/2017 1:00 PM Briscoe Deutscher, DO [facility] None

## 2017-05-26 ENCOUNTER — Other Ambulatory Visit: Payer: Self-pay

## 2017-05-26 ENCOUNTER — Other Ambulatory Visit: Payer: Self-pay | Admitting: Pharmacist

## 2017-05-26 DIAGNOSIS — E1122 Type 2 diabetes mellitus with diabetic chronic kidney disease: Secondary | ICD-10-CM | POA: Insufficient documentation

## 2017-05-26 DIAGNOSIS — N183 Chronic kidney disease, stage 3 unspecified: Secondary | ICD-10-CM | POA: Insufficient documentation

## 2017-05-26 NOTE — Patient Outreach (Addendum)
Vanessa Gibson [facility]) Care Management  05/26/17  Vanessa Gibson  1928/10/13 [id number]  New referral received from telephonic on 05/15/2017 for diabetes.  Second attempt to reach patient successful. Call completed with patient, patient identification verified. Patient initially stated that she has handed over all her care to her children because she does not understand what anyone is telling her when they call. RNCM explained role and reason for call and patient was willing to speak briefly about her diabetes. Her preference is to discuss everything during a home visit. Patient stated that since taking her insulin, her blood sugars have been better. She started her new insulin regimen about 3 months ago when her sugars were in the 300's. She stated it really scared her when she started seeing those higher numbers. She stated her sugars are now between 94 and 170 - with very few days on the lower end. She stated that she is checking her blood sugar once a day in the mornings, fasting usually, but that her doctor had encouraged her to also check it once in a while before and after dinner. She stated that although they are better, they are not as low as she would like them to be.  Patient stated that she is very nervous and that she does not think the insulin is working for her. She stated that she has been trying to eat a little better, avoiding potatoes, bread and other carbohydrates. She stated that she did have a craving yesterday  for popcorn and ate the whole bag. She stated that because of that, her sugar was higher this morning. Today's fasting blood sugar was 168. Patient stated that she saw a new doctor yesterday and she is hopeful that this will work out for her. She [facility] Dr. Briscoe Deutscher [facility]. She stated that she took some blood tests and she needs to call her about the results. Patient stated she moved here from [place] and that her daughter lives  here, but "I really cannot count on her because she is a nervous wreck." She has limited support system locally.  Plan: Patient stated she would talk about any other questions RNCM has and her diabetes management at home visit. RNCM offered support and reassurance and advised we will discuss her current regimen, her diet and what ways she can change to better control her blood sugars. She was grateful for [facility] outreach. Home visit scheduled for 2 weeks. [facility] CM Care Plan Problem One     Most Recent Value  Care Plan Problem One  Knowledge deficit related to diabetes management as evidenced by uncontrolled diabetes with A1C of 8.9  Role Documenting the Problem One  Care Management [place] for Problem One  Active  [facility] Long Term Goal   Patient will reduce A1C to less than 7 within the next 3 months.  [facility]N Long Term Goal Start Date  05/26/17  Interventions for Problem One Long Term Goal  RNCM will provide education and support related to self-management of diabetes. RNCM encouraged patient to continue checking blood sugars as directed and to follow up with PCP with any changes or concerns.  [facility] CM Short Term Goal #1   Patient will verbalize signs and symptoms of hyperglycemia and hypoglycemia within the next 30 days.  [facility] CM Short Term Goal #1 Start Date  05/26/17  Interventions for Short Term Goal #1  RNCM to provide education regarding hypo and hyperglycemia. RNCM will encourage  patient to report any episodes to PCP.  [facility]N CM Short Term Goal #2   Patient will check blood sugar at least once a , at various times for next 30 days  [facility] CM Short Term Goal #2 Start Date  05/26/17  Interventions for Short Term Goal #2  RNCM to monitor patient's adherence to checking sugar,  educated on importance of checking blood sugar at various times to help determine appropriate treatment options for provider.

## 2017-05-27 NOTE — Patient Outreach (Signed)
[facility]) Care Management  [place]   05/26/2017  Vanessa Gibson October 10, 1928 [id number]  Subjective: Home visit completed at patient's home.  Patient's son-in-law, Sharee Pimple was present for the visit.  Patient is an 83 year old female with multiple medical conditions including but not limited to:  Type 2 diabetes, CKD stage 3, hyperlipidemia, hypertension, and GERD.  Patient lives in an assisted living facility. Her son-in-law and daughter are her primary care givers.  Patient manages her own medications and can inject her insulin on her own.  She does not use a pill box because she prefers to read the medication bottles.  Her medications are delivered to her from [facility].   Objective:   Encounter Medications: Outpatient Encounter Prescriptions as of 05/26/2017  Medication Sig Note  . acetaminophen (TYLENOL) 325 MG tablet Take 2 tablets (650 mg total) by mouth every 6 (six) hours as needed for moderate pain or headache.   . Alcohol Swabs (B-D SINGLE USE SWABS REGULAR) PADS Use to clean finger before checking blood sugars and clean skin before insulin injections.   . Ascorbic Acid (VITAMIN C) 1000 MG tablet Take 1,000 mg by mouth daily.   Marland Kitchen aspirin EC 81 MG tablet Take 81 mg by mouth daily.   . feeding supplement, GLUCERNA SHAKE, (GLUCERNA SHAKE) LIQD Take 237 mLs by mouth 3 (three) times daily between meals.   . fluticasone (FLONASE) 50 MCG/ACT nasal spray Place 2 sprays into both nostrils daily.   Marland Kitchen glimepiride (AMARYL) 2 MG tablet Take 1 tablet (2 mg total) by mouth 2 (two) times daily. 05/26/2017: Patient reports only taking 1 tablet daily  . glucose blood (ACCU-CHEK GUIDE) test strip USE TO TEST BLOOD SUGAR 1 TIME DAILY- DX CODE E11.8   . insulin aspart (NOVOLOG FLEXPEN) 100 UNIT/ML FlexPen Inject 4 units before breakfast and 4 units before supper   . insulin degludec (TRESIBA) 100 UNIT/ML SOPN FlexTouch Pen Inject 6 units into the skin once daily (Patient  taking differently: 8 Units. Inject 6 units into the skin once daily)   . Insulin Pen Needle (BD PEN NEEDLE NANO U/F) 32G X 4 MM MISC Use to inject insulin once daily   . isosorbide mononitrate (IMDUR) 30 MG 24 hr tablet 30 mg. One tablet daily   . metoprolol tartrate (LOPRESSOR) 25 MG tablet Take 25 mg by mouth 2 (two) times daily.   . mirtazapine (REMERON) 15 MG tablet Take 1 tablet (15 mg total) by mouth at bedtime.   . Multiple Vitamins-Minerals (CENTRUM SILVER 50+MEN) TABS Take 1 tablet by mouth daily.   . Multiple Vitamins-Minerals (HAIR/SKIN/NAILS/BIOTIN PO) Take 1 tablet by mouth daily.   . multivitamin-lutein (OCUVITE-LUTEIN) CAPS capsule Take 1 capsule by mouth daily.   . nitroGLYCERIN (NITROSTAT) 0.3 MG SL tablet Place 0.3 mg under the tongue every 5 (five) minutes as needed for chest pain.   . Omega-3 Fatty Acids (ULTRA OMEGA-3 FISH OIL PO) Take 1 capsule by mouth daily.   . Probiotic Product (PROBIOTIC FORMULA) CAPS Take 1 capsule by mouth daily.   . Turmeric Curcumin 500 MG CAPS Take 1 capsule by mouth daily.   Marland Kitchen buPROPion (WELLBUTRIN XL) 150 MG 24 hr tablet 150 mg. Take one tablet daily in the morning   . levocetirizine (XYZAL) 5 MG tablet Take 5 mg by mouth every evening.   . lovastatin (MEVACOR) 20 MG tablet Take 20 mg by mouth at bedtime.   . meclizine (ANTIVERT) 12.5 MG tablet Take 1  tablet (12.5 mg total) by mouth 2 (two) times daily. (Patient not taking: Reported on 05/26/2017) 05/26/2017: Held my provider  . pantoprazole (PROTONIX) 40 MG tablet Take 40 mg by mouth daily.    No facility-administered encounter medications on file as of 05/26/2017.     Functional Status: In your present state of health, do you have any difficulty performing the following activities: 05/18/2017  Hearing? N  Vision? N  Difficulty concentrating or making decisions? Y  Walking or climbing stairs? Y  Dressing or bathing? N  Doing errands, shopping? Y  Preparing Food and eating ? N  Using the  Toilet? N  In the past six months, have you accidently leaked urine? N  Do you have problems with loss of bowel control? N  Managing your Medications? Y  Managing your Finances? Y  Housekeeping or managing your Housekeeping? Y  Some recent data might be hidden    Fall/Depression Screening: Fall Risk  05/18/2017 12/04/2015  Falls in the past year? Yes Yes  Number falls in past yr: 2 or more 2 or more  Injury with Fall? Yes Yes  Risk Factor Category  High Fall Risk High Fall Risk  Risk for fall due to : History of fall(s);Impaired balance/gait;Impaired mobility;Mental status change History of fall(s);Medication side effect  Follow up Education provided;Falls prevention discussed Education provided   [facility] 2/9 Scores 05/18/2017 05/15/2017 12/04/2015  PHQ - 2 Score 4 6 3   PHQ- 9 Score 18 21 9       Assessment: Patient's medications were reviewed in her home.   Drugs sorted by system:  Neurologic/Psychologic: Bupropion-patient did not have this at home visit and did not remember taking anything for her "mood" Mirtazapine   Cardiovascular: Aspirin Isosorbide Mononitrate Metoprolol  Nitroglycerin Omega 3 Fatty Acid lovastatin    Pulmonary/Allergy: Fluticasone Xyzal   Gastrointestinal: Pantoprazole  Endocrine: Glimepiride Novolog  Tresiba   Renal:  Topical:  Pain:  Vitamins/Minerals: Centrum Silver Hair/Skin/Nails multivitamin Ocuvite Probiotic Tumeric Vitamin C  Medication Review Findings:  1.  Bupropion-patient reported she is not taking Bupropion.  PHQ-9 completed on 05/18/17 was 18.  2.  Glimepiride-dose in chart and on medication label is 1 tablet twice daily.  Patient reported only taking 1 tablet    daily due to it causing constipation.  With patient's age, renal function, and being on basal/bolus insulin, dosing Glimepiride 2mg  1 tablet daily may be prudent.  After her basal/bolus therapy is optimized, glimepiride may be able to be  discontinued.  3.  Deprescribing Opportunities-Patient is taking several OTC vitamin products she reported starting on her own.  Currently, she is taking Centrum/Ocuvite/Hair,Skin,Nails/Tumeric/Vitamin C. If deemed therapeutically appropriate, vitamin therapy could be reviewed and those that are duplicates or provide limited value could be discontinued.  3.  New Prescriptions needed:   -Nitroglycerin-patient has some and the bottle has been open but she has not had a new bottle in > 1year.     Nitroglycerin tablets do not hold potency >1-3 months.  Patient said she did not have an active prescription  on file for nitroglycerin.  If deemed therapeutically appropriate, please send a new nitroglycerin prescription  to the patient's pharmacy.   -Test Strips-patient reported checking her blood sugar at home twice a day.  Patient complained that she  does not have enough test strips to test her blood sugar every month. The label on her test strip box states  she is to test her blood sugar once daily.  Since the patient is  giving herself multiple insulin injections, testing  more than once a day makes sense. If deemed therapeutically appropriated, please send a new  prescription to the patient's pharmacy for Accu-Chek Guide Test Strips with instructions to test at least twice  daily.   -Accu Check Guide Lancets-patient did not have anymore lancets to go with her Accu-Chec guide lancet  device.  Please also send a new prescription for her lancets.  Medication Assistance Findings:  -patient is over income for the "Extra Help" program offered by Social Security -patient does appear to meet the requirements to receive [name] and Novolog from [employer] application was provided to the patient's son-in-law who requested to complete the form himself and said he would call if he needed assistance -the necessary income verification paperwork as well as the most recent EOB from [facility] was  reviewed. -patient was provided the most recent [place] so she   Plan: 1.  Patient's son-in-law will complete the NovoNordisk paperwork and take it to the patient's physician for signature and faxing.  2.  Note will be routed to the patient's provider  3.  Follow up with the patient and son-in-law in 10-14 days   [name], PharmD, [place] Clinical Pharmacist [phone]

## 2017-05-28 ENCOUNTER — Other Ambulatory Visit: Payer: Self-pay

## 2017-05-28 ENCOUNTER — Encounter: Payer: Self-pay | Admitting: Family Medicine

## 2017-05-28 ENCOUNTER — Telehealth: Payer: Self-pay | Admitting: Endocrinology

## 2017-05-28 MED ORDER — ISOSORBIDE MONONITRATE ER 30 MG PO TB24: 30.0000 mg | ORAL_TABLET | Freq: Every day | ORAL | 3 refills | Status: DC

## 2017-05-28 NOTE — Telephone Encounter (Signed)
Called patient and she stated that she did not call us to refill her medication. She stated that she will call [facility] and her PCP.

## 2017-05-28 NOTE — Telephone Encounter (Signed)
This note was routed to endo in error. Should have gone to dr Juleen China. Please see below.   Patient needs refill on isosorbide mononitrate (IMDUR) 30 MG 24 hr tablet . Please send to [facility].

## 2017-05-28 NOTE — Telephone Encounter (Signed)
Patient has been getting from historical provider. Please advise if okay to refill or decline.

## 2017-05-28 NOTE — Telephone Encounter (Signed)
Patient needs refill on isosorbide mononitrate (IMDUR) 30 MG 24 hr tablet . Please send to [facility].

## 2017-05-28 NOTE — Telephone Encounter (Signed)
RX sent to pharmacy  

## 2017-05-28 NOTE — Telephone Encounter (Signed)
She has to get this from her PCP or cardiologist

## 2017-05-29 ENCOUNTER — Telehealth: Payer: Self-pay | Admitting: Endocrinology

## 2017-05-29 MED ORDER — ISOSORBIDE MONONITRATE ER 30 MG PO TB24: 30.0000 mg | ORAL_TABLET | Freq: Every day | ORAL | 0 refills | Status: DC

## 2017-05-29 NOTE — Addendum Note (Signed)
Addended by: Durwin Glaze on: 05/29/2017 09:29 AM   Modules accepted: Orders

## 2017-05-29 NOTE — Telephone Encounter (Signed)
Potassium is ok but I need to se her asap for her diabetes, nothing scheduled

## 2017-05-29 NOTE — Telephone Encounter (Signed)
Patient would like a call as soon as possible to discuss potassium levels and the next steps she should take. Call patient as soon as possible.

## 2017-05-29 NOTE — Telephone Encounter (Signed)
Spoke with patient about lab results and she wanted new RX for imdur sent to [facility]. I have sent in 30 day supply due to patient being out.

## 2017-05-29 NOTE — Telephone Encounter (Signed)
LM for pt to call back to schedule °

## 2017-05-29 NOTE — Telephone Encounter (Signed)
Called patient and she stated that she was not given very much information about her recent lab results. Can you look at this and let me know how to advise her? She is worried about her potassium levels and what she needs to do. Please advise.

## 2017-06-01 ENCOUNTER — Telehealth: Payer: Self-pay | Admitting: Family Medicine

## 2017-06-01 NOTE — Telephone Encounter (Signed)
Patient called to request to speak with dr wallace.   I had a har time clarifying exactly what it is she wanted.   She stated that she is still feeling moody and just generally "bad". She also stated that her children would not share any information with her on the labs and that she feels left in the dark.   I asked her exactly what we could give her to help her not feel in the dark. She states that she is going to see dr Dwyane Dee and that we have not been able to cure her and make her feel better. If our results are normal, then she should not feel this way. I advised her that dr Juleen China may not be able to answer a question like that without seeing her again. It may require further testing. But she does everything in her power to treat. That might not always mean "curing" though.   She then proceeded to advise she wasn't totally even sure what she is asking for to begin with.  I notated this conversation simply to denote that it was unclear exactly what it is the patient wanted.

## 2017-06-02 ENCOUNTER — Other Ambulatory Visit: Payer: Self-pay | Admitting: *Deleted

## 2017-06-02 NOTE — Patient Outreach (Signed)
[facility]) Care Management  06/02/2017  Vanessa Gibson 1928/03/26 [id number]   CSW was able to make contact with patient today to follow-up regarding social work services and resources, as well as to ensure that patient received the packet of resource information mailed to her home. Patient admitted that she was feeling "horrible" today, not wanting to talk on the phone for long. Patient received the packet of information that CSW mailed to her home, which included information about Depression, signs, symptoms, etc. Patient has also had an opportunity to review contents. Patient denied having any questions or wanting a referral for services.  Patient reported, "I have too much going on with doctors appointments right now to worry about anything else". CSW offered counseling and supportive services, where appropriate.  CSW was able to ensure that patient has the correct contact information for CSW, encouraging patient to contact CSW directly if any additional social work needs are identified in the near future.  Patient voiced understanding and was agreeable to this plan. CSW will perform a case closure on patient, as all goals of treatment have been met from social work standpoint and no additional social work needs have been identified at this time.  CSW will notify patient's RNCM with [facility] [facility], Tish Men of CSW's plans to close patient's case.  CSW will fax an update to patient's Primary Care Physician, Dr. Briscoe Deutscher to ensure that they are aware of CSW's involvement with patient's plan of care.  CSW will submit a case closure request to Verlon Setting, Care Management Assistant with [facility] [facility], in the form of an [employer].  CSW will ensure that Mrs. Comer is aware of Mrs. Satterfield's, RNCM with [facility] [employer], continued involvement with patient's care. Nat Christen, BSW, MSW,  LCSW  Licensed Education officer, [facility]  Mailing [place] [address], [place], [place] [address] Physical Address-[address], [place], [place] [address] Toll Free Main # [phone] Fax # [phone] Cell # [phone]  Office # [phone] [email or web address]

## 2017-06-02 NOTE — Telephone Encounter (Signed)
Spoke with patient and she stated that she is having some sinus pressure. I have schedule patient to come in for an appointment. Patient stated that she is tired of going to doctors and them not fixing her. I apologized and told her hopefully we can get her on the right track.

## 2017-06-03 ENCOUNTER — Encounter: Payer: Self-pay | Admitting: Endocrinology

## 2017-06-03 ENCOUNTER — Telehealth: Payer: Self-pay

## 2017-06-03 ENCOUNTER — Telehealth: Payer: Self-pay | Admitting: Endocrinology

## 2017-06-03 ENCOUNTER — Encounter: Payer: Medicare HMO | Admitting: Endocrinology

## 2017-06-03 NOTE — Telephone Encounter (Signed)
Referral has to be done by her PCP, recommend that she can go to an urgent care center if she needs treatment for her nasal condition

## 2017-06-03 NOTE — Telephone Encounter (Signed)
Patient called and said she wanted to know if you needed to adjust her insulin. Please advise.  Also patient asked if you can refer her to a bladder specialist? She can hardly make it to the bathroom and wears a pad and protective underwear. She stated is it a real problem and no one is seeing her for this. Also if you can refer her to a sinus/allergy specialist too. She is having a terrible time with sinus/allergy problems, severe nasal congestion and stuffy head. Please advise.

## 2017-06-03 NOTE — Progress Notes (Signed)
This encounter was created in error - please disregard.

## 2017-06-03 NOTE — Telephone Encounter (Signed)
Left a vm requesting the patient call me back to discuss note

## 2017-06-03 NOTE — Telephone Encounter (Signed)
Please let the patient and her daughter know that her morning sugars are higher than target and she can increase her Tresiba insulin which is green from 8-9 units She also should check her sugars 2-3 hours after evening meal and call if over 200 Please reschedule her visit for about a month from now preferably with labs before the visit

## 2017-06-04 ENCOUNTER — Ambulatory Visit: Payer: Medicare HMO | Admitting: *Deleted

## 2017-06-04 ENCOUNTER — Telehealth: Payer: Self-pay

## 2017-06-04 ENCOUNTER — Ambulatory Visit (INDEPENDENT_AMBULATORY_CARE_PROVIDER_SITE_OTHER): Payer: Medicare HMO | Admitting: Family Medicine

## 2017-06-04 ENCOUNTER — Encounter: Payer: Self-pay | Admitting: Family Medicine

## 2017-06-04 VITALS — BP 130/74 | HR 74 | Temp 98.2°F | Ht 59.0 in | Wt 116.0 lb

## 2017-06-04 DIAGNOSIS — H539 Unspecified visual disturbance: Secondary | ICD-10-CM

## 2017-06-04 DIAGNOSIS — F341 Dysthymic disorder: Secondary | ICD-10-CM | POA: Diagnosis not present

## 2017-06-04 DIAGNOSIS — G4700 Insomnia, unspecified: Secondary | ICD-10-CM

## 2017-06-04 NOTE — Progress Notes (Signed)
Vanessa Gibson is a 83 y.o. female here for an acute visit.  History of Present Illness:   Shaune Pascal CMA acting as scribe for Dr. Juleen China.  HPI:   1. Vision changes. Presenting for "sinus issues" but actual CC is vision changes. Patient is a poor historian and has a difficult time describing her symptoms. She describes what sounds like diplopia that started acutely a few days ago. Neurological ROS: negative for - dizziness, headaches, impaired coordination/balance, seizures, speech problems, tremors or weakness.    2. Dysthymia. Improving slightly since our last visit. She is still upset that I have not "fxed her." Sleeping has improved with Remeron. Eating has improved. She has not started exercising again or engaging in group activities again yet.   PMHx, SurgHx, SocialHx, Medications, and Allergies were reviewed in the Visit Navigator and updated as appropriate.  Current Medications:    .  acetaminophen (TYLENOL) 325 MG tablet, Take 2 tablets (650 mg total) by mouth every 6 (six) hours as needed for moderate pain or headache., Disp: , Rfl:  .  Alcohol Swabs (B-D SINGLE USE SWABS REGULAR) PADS, Use to clean finger before checking blood sugars and clean skin before insulin injections., Disp: 200 each, Rfl: 2 .  Ascorbic Acid (VITAMIN C) 1000 MG tablet, Take 1,000 mg by mouth daily., Disp: , Rfl:  .  aspirin EC 81 MG tablet, Take 81 mg by mouth daily., Disp: , Rfl:  .  esomeprazole (NEXIUM) 20 MG capsule, Take 20 mg by mouth daily at 12 noon., Disp: , Rfl:  .  feeding supplement, GLUCERNA SHAKE, (GLUCERNA SHAKE) LIQD, Take 237 mLs by mouth 3 (three) times daily between meals., Disp: , Rfl: 0 .  fluticasone (FLONASE) 50 MCG/ACT nasal spray, Place 2 sprays into both nostrils daily., Disp: , Rfl:  .  glimepiride (AMARYL) 2 MG tablet, Take 1 tablet (2 mg total) by mouth 2 (two) times daily., Disp: 180 tablet, Rfl: 1 .  glucose blood (ACCU-CHEK GUIDE) test strip, USE TO TEST BLOOD SUGAR 1 TIME  DAILY- DX CODE E11.8, Disp: 100 each, Rfl: 5 .  insulin aspart (NOVOLOG FLEXPEN) 100 UNIT/ML FlexPen, Inject 4 units before breakfast and 4 units before supper, Disp: 5 pen, Rfl: 4 .  insulin degludec (TRESIBA) 100 UNIT/ML SOPN FlexTouch Pen, Inject 6 units into the skin once daily (Patient taking differently: 8 Units. Inject 8 units into the skin once daily), Disp: 18 pen, Rfl: 1 .  Insulin Pen Needle (BD PEN NEEDLE NANO U/F) 32G X 4 MM MISC, Use to inject insulin once daily, Disp: 90 each, Rfl: 2 .  isosorbide mononitrate (IMDUR) 30 MG 24 hr tablet, Take 1 tablet (30 mg total) by mouth daily. One tablet daily, Disp: 30 tablet, Rfl: 0 .  levocetirizine (XYZAL) 5 MG tablet, Take 5 mg by mouth every evening., Disp: , Rfl:  .  lovastatin (MEVACOR) 20 MG tablet, Take 20 mg by mouth at bedtime., Disp: , Rfl:  .  metoprolol tartrate (LOPRESSOR) 25 MG tablet, Take 25 mg by mouth 2 (two) times daily., Disp: , Rfl:  .  mirtazapine (REMERON) 15 MG tablet, Take 1 tablet (15 mg total) by mouth at bedtime., Disp: 30 tablet, Rfl: 2 .  Multiple Vitamins-Minerals (CENTRUM SILVER 50+WOMEN) TABS, Take 1 tablet by mouth daily., Disp: , Rfl:  .  Multiple Vitamins-Minerals (HAIR/SKIN/NAILS/BIOTIN PO), Take 1 tablet by mouth daily., Disp: , Rfl:  .  multivitamin-lutein (OCUVITE-LUTEIN) CAPS capsule, Take 1 capsule by mouth daily., Disp: ,  Rfl:  .  nitroGLYCERIN (NITROSTAT) 0.3 MG SL tablet, Place 0.3 mg under the tongue every 5 (five) minutes as needed for chest pain., Disp: , Rfl:  .  Omega-3 Fatty Acids (ULTRA OMEGA-3 FISH OIL PO), Take 1 capsule by mouth daily., Disp: , Rfl:  .  Probiotic Product (PROBIOTIC FORMULA) CAPS, Take 1 capsule by mouth daily., Disp: , Rfl:  .  Turmeric Curcumin 500 MG CAPS, Take 1 capsule by mouth daily., Disp: , Rfl:    No Known Allergies   Review of Systems:   Review of Systems  All other systems reviewed and are negative.  Vitals:   Vitals:   06/04/17 1303  BP: 130/74  Pulse:  74  Temp: 98.2 F (36.8 C)  TempSrc: Oral  SpO2: 96%  Weight: 116 lb (52.6 kg)  Height: 4\' 11"  (1.499 m)     Body mass index is 23.43 kg/m.  Physical Exam:   Physical Exam  Constitutional: She appears well-developed and well-nourished. No distress.  HENT:  Head: Normocephalic and atraumatic.  Right Ear: External ear normal.  Left Ear: External ear normal.  Nose: Nose normal.  Mouth/Throat: Oropharynx is clear and moist.  Eyes: Pupils are equal, round, and reactive to light. Conjunctivae and EOM are normal.  Neck: Normal range of motion. Neck supple. No thyromegaly present.  Cardiovascular: Normal rate, regular rhythm, normal heart sounds and intact distal pulses.   Pulmonary/Chest: Effort normal and breath sounds normal.  Abdominal: Soft. Bowel sounds are normal.  Neurological: She is alert. She displays normal reflexes. No cranial nerve deficit or sensory deficit. She exhibits normal muscle tone. Coordination normal.  Skin: Skin is warm.  Psychiatric: She has a normal mood and affect. Her behavior is normal.  Nursing note and vitals reviewed.   Results for orders placed or performed in visit on 05/25/17  CBC with Differential/Platelet  Result Value Ref Range   WBC 8.3 4.0 - 10.5 K/uL   RBC 3.63 (L) 3.87 - 5.11 Mil/uL   Hemoglobin 11.9 (L) 12.0 - 15.0 g/dL   HCT 34.7 (L) 36.0 - 46.0 %   MCV 95.7 78.0 - 100.0 fl   MCHC 34.2 30.0 - 36.0 g/dL   RDW 12.5 11.5 - 15.5 %   Platelets 232.0 150.0 - 400.0 K/uL   Neutrophils Relative % 56.1 43.0 - 77.0 %   Lymphocytes Relative 31.3 12.0 - 46.0 %   Monocytes Relative 8.2 3.0 - 12.0 %   Eosinophils Relative 3.7 0.0 - 5.0 %   Basophils Relative 0.7 0.0 - 3.0 %   Neutro Abs 4.7 1.4 - 7.7 K/uL   Lymphs Abs 2.6 0.7 - 4.0 K/uL   Monocytes Absolute 0.7 0.1 - 1.0 K/uL   Eosinophils Absolute 0.3 0.0 - 0.7 K/uL   Basophils Absolute 0.1 0.0 - 0.1 K/uL  Comprehensive metabolic panel  Result Value Ref Range   Sodium 138 135 - 145 mEq/L     Potassium 4.9 3.5 - 5.1 mEq/L   Chloride 104 96 - 112 mEq/L   CO2 27 19 - 32 mEq/L   Glucose, Bld 151 (H) 70 - 99 mg/dL   BUN 33 (H) 6 - 23 mg/dL   Creatinine, Ser 1.24 (H) 0.40 - 1.20 mg/dL   Total Bilirubin 0.3 0.2 - 1.2 mg/dL   Alkaline Phosphatase 31 (L) 39 - 117 U/L   AST 18 0 - 37 U/L   ALT 13 0 - 35 U/L   Total Protein 6.8 6.0 - 8.3 g/dL  Albumin 4.0 3.5 - 5.2 g/dL   Calcium 9.3 8.4 - 10.5 mg/dL   GFR 43.53 (L) >60.00 mL/min  TSH  Result Value Ref Range   TSH 1.39 0.35 - 4.50 uIU/mL  Vitamin B12  Result Value Ref Range   Vitamin B-12 711 211 - 911 pg/mL   Assessment and Plan:   Vanessa Gibson was seen today for sinus problem.  Diagnoses and all orders for this visit:  Vision changes Comments: Acute. Patient is a difficult historian. Concern for diploplia. Urgent referral to EYE. Will obtain CT head if no concerns.  Dysthymia Comments: Improved slightly with Remeron. Still a new medication. No concern re: side effects.   Insomnia, unspecified type Comments: Improved with Remeron.   . Reviewed expectations re: course of current medical issues. . Discussed self-management of symptoms. . Outlined signs and symptoms indicating need for more acute intervention. . Patient verbalized understanding and all questions were answered. Marland Kitchen Health Maintenance issues including appropriate healthy diet, exercise, and smoking avoidance were discussed with patient. . See orders for this visit as documented in the electronic medical record. . Patient received an After Visit Summary.  CMA served as Education administrator during this visit. History, Physical, and Plan performed by medical provider. The above documentation has been reviewed and is accurate and complete. Briscoe Deutscher, D.O.  Briscoe Deutscher, DO [place], [place] 06/06/2017  Future Appointments Date Time Provider [place]  06/08/2017 10:30 AM [name], [facility] None  06/09/2017 1:00 PM Loletta Specter, RN  [facility] None  07/28/2017 1:00 PM Briscoe Deutscher, DO [facility] None

## 2017-06-04 NOTE — Telephone Encounter (Signed)
Patient returned phone call regarding Vanessa Gibson's last note. I advised patient to discuss the referral that was needed with her PCP, patient understood. No other questions at this time.

## 2017-06-04 NOTE — Telephone Encounter (Signed)
Please let the patient and her daughter know that her morning sugars are higher than target and she can increase her Tresiba insulin which is green from 8-9 units She also should check her sugars 2-3 hours after evening meal and call if over 200 Please reschedule her visit for about a month from now preferably with labs before the visit

## 2017-06-04 NOTE — Telephone Encounter (Signed)
Called patients daughter and left a voice message for the changes in [name] and to call us back to schedule a one month follow up and labs. I will call [place] also.

## 2017-06-05 ENCOUNTER — Ambulatory Visit: Payer: Self-pay | Admitting: Pharmacist

## 2017-06-06 DIAGNOSIS — H539 Unspecified visual disturbance: Secondary | ICD-10-CM | POA: Insufficient documentation

## 2017-06-06 DIAGNOSIS — F341 Dysthymic disorder: Secondary | ICD-10-CM | POA: Insufficient documentation

## 2017-06-06 DIAGNOSIS — G4700 Insomnia, unspecified: Secondary | ICD-10-CM | POA: Insufficient documentation

## 2017-06-07 NOTE — Progress Notes (Addendum)
Vanessa Gibson, D.O. [place], [facility]

## 2017-06-08 ENCOUNTER — Other Ambulatory Visit: Payer: Self-pay | Admitting: Pharmacist

## 2017-06-08 NOTE — Patient Outreach (Signed)
[facility]) Care Management  06/08/2017  Vanessa Gibson [birth year] [id number]   Called and spoke with patient's son-in-law Vanessa Gibson. HIPAA identifiers were obtained.  Vanessa Gibson confirmed he completed all the paperwork but is waiting on patient to spend >$1000 on medications. Per her last EOB, she had spent $912.  Once patient has spent >$1000 and has a pharmacy print out or EOB to proof her expenditures, patient's son-in-law said he would take the applications to the provider's office.  Plan:  Follow up with patient's son-in-law in 2-3 weeks.    [name], PharmD, [place] Clinical Pharmacist [phone]

## 2017-06-08 NOTE — Telephone Encounter (Signed)
Called patient and left a voice message to see if new dosage was helping and to make sure she calls [place] to set up a follow up visit in a month with labs.

## 2017-06-09 ENCOUNTER — Other Ambulatory Visit: Payer: Self-pay

## 2017-06-09 ENCOUNTER — Ambulatory Visit: Payer: Medicare HMO | Admitting: Pharmacist

## 2017-06-09 DIAGNOSIS — H353122 Nonexudative age-related macular degeneration, left eye, intermediate dry stage: Secondary | ICD-10-CM | POA: Diagnosis not present

## 2017-06-09 DIAGNOSIS — H35371 Puckering of macula, right eye: Secondary | ICD-10-CM | POA: Diagnosis not present

## 2017-06-09 DIAGNOSIS — E119 Type 2 diabetes mellitus without complications: Secondary | ICD-10-CM | POA: Diagnosis not present

## 2017-06-09 DIAGNOSIS — H353112 Nonexudative age-related macular degeneration, right eye, intermediate dry stage: Secondary | ICD-10-CM | POA: Diagnosis not present

## 2017-06-09 DIAGNOSIS — H353132 Nonexudative age-related macular degeneration, bilateral, intermediate dry stage: Secondary | ICD-10-CM | POA: Diagnosis not present

## 2017-06-09 NOTE — Patient Outreach (Signed)
[place] [facility]) Care Management  06/09/2017  Vanessa Gibson 13-May-1928 [id number]  RNCM received voicemail from patient last week on 06/04/2017 at 3:05 pm while RNCM was out of the office requesting to cancel and reschedule home visit due to having something else scheduled at the same time.   RNCM attempted to contact patient today, upon RNCM return to office to discuss and reschedule home visit. Left HIPAA compliant voicemail with RNCM contact information and requested patient call back to reschedule appointment.  [place] R. Shakir Petrosino, RN, BSN, [place] Management Coordinator [phone]

## 2017-06-09 NOTE — Telephone Encounter (Signed)
Patient returning phone call. Okay to leave a detailed message on phone.  °

## 2017-06-09 NOTE — Telephone Encounter (Signed)
Spoke with the patient and she seemed very confused I advised her of the previous note and stated she needed to call her PCP to find out how they want to proceed with nasal condition- patient stated that she is gets very confused and is not sure she really gets anywhere when she calls them so I agreed to call her PCP and call her back when I know if there will be a referral for her

## 2017-06-12 ENCOUNTER — Telehealth: Payer: Self-pay | Admitting: Family Medicine

## 2017-06-12 ENCOUNTER — Telehealth: Payer: Self-pay | Admitting: Endocrinology

## 2017-06-12 DIAGNOSIS — H539 Unspecified visual disturbance: Secondary | ICD-10-CM

## 2017-06-12 NOTE — Telephone Encounter (Signed)
Routing to you °

## 2017-06-12 NOTE — Telephone Encounter (Signed)
Patient returning call in reference to insulin amount being changed. Patient wants clarification.   Please call patient and advise.

## 2017-06-12 NOTE — Telephone Encounter (Signed)
Patient requesting an allergist referral as soon as possible. I advised the patient that the [facility] team was gone for the day however, I would put a note in the system to be reviewed next week. Please advise.

## 2017-06-13 NOTE — Telephone Encounter (Signed)
Is it ok to put in referral for allergy.

## 2017-06-13 NOTE — Telephone Encounter (Signed)
At our last visit, she had no allergic issues. She had diplopia and thought vision changes were due to sinuses. Let's clarify first - did she see Eye? I wanted to get an MRI next if not eye related. Will need notes since patient is poor historian.

## 2017-06-16 NOTE — Telephone Encounter (Signed)
Spoke with patient and she stated that the eye doctor said that it was nothing with the eyes. I advised that the next step that Dr. Juleen China wanted to take was to get an MRI. Patient is fine with the MRI. She is having pressure in the forehead and can't see straight. Please order MRI.

## 2017-06-16 NOTE — Telephone Encounter (Signed)
Head CT without contrast. [place] CT.

## 2017-06-17 NOTE — Telephone Encounter (Signed)
I have placed an order for this patient to have CT scan. Can you please schedule.

## 2017-06-18 NOTE — Telephone Encounter (Signed)
Patient scheduled for CT 06/23/17 at 1600.  Prior auth received. Left vm for patient.

## 2017-06-19 ENCOUNTER — Telehealth: Payer: Self-pay

## 2017-06-19 NOTE — Telephone Encounter (Signed)
Called and explained to patient that from the note on 06/03/17, it was mentioned to change tresiba from 8 units to 9 units. Patient has been doing only 8 right now, sugars are still a little on the high side, so will start doing the 9 units now.

## 2017-06-19 NOTE — Telephone Encounter (Signed)
Noted thank you

## 2017-06-22 ENCOUNTER — Inpatient Hospital Stay: Admission: RE | Admit: 2017-06-22 | Payer: Medicare HMO | Source: Ambulatory Visit

## 2017-06-23 ENCOUNTER — Inpatient Hospital Stay: Admission: RE | Admit: 2017-06-23 | Payer: Medicare HMO | Source: Ambulatory Visit

## 2017-06-24 ENCOUNTER — Ambulatory Visit (INDEPENDENT_AMBULATORY_CARE_PROVIDER_SITE_OTHER)
Admission: RE | Admit: 2017-06-24 | Discharge: 2017-06-24 | Disposition: A | Discharge status: Home / Self Care | Payer: Medicare HMO | Source: Ambulatory Visit | Attending: Family Medicine | Admitting: Family Medicine

## 2017-06-24 DIAGNOSIS — H539 Unspecified visual disturbance: Secondary | ICD-10-CM | POA: Diagnosis not present

## 2017-06-24 DIAGNOSIS — R42 Dizziness and giddiness: Secondary | ICD-10-CM | POA: Diagnosis not present

## 2017-06-25 ENCOUNTER — Other Ambulatory Visit: Payer: Self-pay | Admitting: Pharmacist

## 2017-06-25 ENCOUNTER — Telehealth: Payer: Self-pay | Admitting: Family Medicine

## 2017-06-25 NOTE — Telephone Encounter (Signed)
Left pt message asking to call Allison back directly at [phone] to schedule AWV. Thanks! ° °*NOTE* Never had AWV before °

## 2017-06-25 NOTE — Patient Outreach (Signed)
[facility]) Care Management  06/25/2017  Vanessa Gibson June 14, 1928 [id number]   Spoke with patient's son-in-law, Vanessa Gibson (who is on her consent form). HIPAA identifiers were obtained. Purpose of the call was to follow up on medication assistance.  [name] said he is waiting on the patient to spend >$1000 so he can have her physician sign the forms and fax them in to the patient assistance program.    Plan:  Close pharmacy case as the patient's son-in-law is handling her applications.  Patient and her son-in-law know they can reach out to me at anytime in the future with medication questions or concerns.  [name], PharmD, [place] Clinical Pharmacist [phone]

## 2017-06-26 ENCOUNTER — Telehealth: Payer: Self-pay | Admitting: Family Medicine

## 2017-06-26 DIAGNOSIS — H938X3 Other specified disorders of ear, bilateral: Secondary | ICD-10-CM

## 2017-06-26 DIAGNOSIS — R42 Dizziness and giddiness: Secondary | ICD-10-CM

## 2017-06-26 NOTE — Telephone Encounter (Signed)
Scheduled 08/05/17

## 2017-06-26 NOTE — Telephone Encounter (Signed)
Gave patient CT results.  She is very frustrated because she has fatigue, dizziness, and feels pressure in her ears, eyes, head.  States she keeps going to different doctors and no one will tell her what's wrong.  Asks for medication to help with her head pressure/pain.  States she has been taking Tylenol but it is not helping.  No change in symptoms.

## 2017-06-26 NOTE — Telephone Encounter (Signed)
Patient calling to get results from CT scan. Asking for return call.

## 2017-06-27 ENCOUNTER — Other Ambulatory Visit: Payer: Self-pay

## 2017-06-27 NOTE — Patient Outreach (Signed)
Vanessa Gibson [facility]) Care Management  06/27/17  [name] October 16, 1928 [id number]  Second attempt to reach patient without success. Left HIPAA compliant voicemail with RNCM contact information and invited patient to return call.  [name], RN, BSN, [place] Management Coordinator [phone]

## 2017-06-28 NOTE — Telephone Encounter (Signed)
Refer to ENT

## 2017-06-29 DIAGNOSIS — C44311 Basal cell carcinoma of skin of nose: Secondary | ICD-10-CM | POA: Diagnosis not present

## 2017-06-29 DIAGNOSIS — L570 Actinic keratosis: Secondary | ICD-10-CM | POA: Diagnosis not present

## 2017-06-29 DIAGNOSIS — C44319 Basal cell carcinoma of skin of other parts of face: Secondary | ICD-10-CM | POA: Diagnosis not present

## 2017-06-30 ENCOUNTER — Ambulatory Visit: Payer: Self-pay | Admitting: Podiatry

## 2017-06-30 NOTE — Telephone Encounter (Signed)
Left detailed message on personal voicemail, Dr. Juleen China ordered a referral to ENT and someone will be contacting you to schedule an appointment. Any questions please call office.

## 2017-06-30 NOTE — Addendum Note (Signed)
Addended by: Marian Sorrow on: 06/30/2017 04:56 PM   Modules accepted: Orders

## 2017-07-03 ENCOUNTER — Other Ambulatory Visit: Payer: Self-pay

## 2017-07-03 NOTE — Patient Outreach (Signed)
[facility]) Care Management  07/03/17  Vanessa Gibson 26-May-1928 [id number]  Successful outreach completed with patient. Patient stated that she does not want to see anyone and currently declines services. RNCM encouraged patient to call if she feels she needs additional services or if she changes her mind. Advised will close her case and provided contact information.  RNCM to perform case closure.  [place] R. Cary Lothrop, RN, BSN, [place] Management Coordinator [phone]

## 2017-07-06 ENCOUNTER — Telehealth: Payer: Self-pay | Admitting: Family Medicine

## 2017-07-06 NOTE — Telephone Encounter (Signed)
Paperwork: Patient Special educational needs teacher received by [employer] requesting form]:  [name]  Individual made aware of 3-5 business day turn around (Y/N): Y  Office form(s) completed and placed with paperwork (Y/N):   Form location:  Dr. Alcario Drought pick up folder in front office.

## 2017-07-07 NOTE — Telephone Encounter (Signed)
I have this paperwork at my desk.  Will let patient know when it has been completed.

## 2017-07-14 NOTE — Telephone Encounter (Signed)
Noted  

## 2017-07-14 NOTE — Telephone Encounter (Signed)
Patient called requesting the [facility] fax or send the paperwork to [facility] on her behalf due to no transportation to come pick up the paperwork and it being over a week and needing the paperwork finished as soon as possible.   After speaking with Roselyn Reef and Hazle Coca advised that [facility] will fax and interoffice the paperwork to [facility] today and request that the supervisor Colletta Maryland please look over and give directly to Dr. Dwyane Dee today. After faxing, I spoke to [place] over the phone and she advised that she will pull it from the fax once it is received.   Paperwork is now in the interoffice folder ready for pick up to be delivered to [facility] by the courier.  Awaiting update from [facility].

## 2017-07-14 NOTE — Telephone Encounter (Signed)
After reviewing paperwork, per Dr. Juleen China, needs to be filled out by endocrinology.  The program is for insulin to be shipped to provider's office and then be picked up by the patient.  The insulin also has to be converted into vials and patient is currently taking tablets and on FlexPen.  This is all prescribed by Endo.  Endo needs to fill out this paperwork.  Left vocemail (patient identified self) advising patient that she needs to come by the office and pick up the paperwork and take it to her endocrinologist to fill out (advised the above reasons as well).

## 2017-07-15 NOTE — Telephone Encounter (Signed)
Paperwork has been signed and is with Thornton Papas

## 2017-07-24 ENCOUNTER — Telehealth: Payer: Self-pay | Admitting: Family Medicine

## 2017-07-24 NOTE — Telephone Encounter (Signed)
Pt left VM asking for call back explaining AWV visit.  07/24/17-Called and left vm for pt to call Ebony Hail back at [phone] to better explain AWV visit with Cassie scheduled 08/05/17

## 2017-07-28 ENCOUNTER — Ambulatory Visit: Payer: Medicare HMO | Admitting: Family Medicine

## 2017-07-29 DIAGNOSIS — Z85828 Personal history of other malignant neoplasm of skin: Secondary | ICD-10-CM | POA: Diagnosis not present

## 2017-07-29 DIAGNOSIS — C44319 Basal cell carcinoma of skin of other parts of face: Secondary | ICD-10-CM | POA: Diagnosis not present

## 2017-07-29 DIAGNOSIS — C44311 Basal cell carcinoma of skin of nose: Secondary | ICD-10-CM | POA: Diagnosis not present

## 2017-07-30 NOTE — Progress Notes (Deleted)
PCP notes:   Health maintenance: Tdap PNA Dexa  Abnormal screenings:    Patient concerns:    Nurse concerns:   Next PCP appt: 08/05/17 2:00

## 2017-07-30 NOTE — Progress Notes (Deleted)
Subjective:   Vanessa Gibson is a 83 y.o. female who presents for an Initial Medicare Annual Wellness Visit.  Review of Systems    No ROS.  Medicare Wellness Visit. Additional risk factors are reflected in the social history.         Objective:    There were no vitals filed for this visit. There is no height or weight on file to calculate BMI.   Current Medications (verified) Outpatient Encounter Prescriptions as of 08/05/2017  Medication Sig  . acetaminophen (TYLENOL) 325 MG tablet Take 2 tablets (650 mg total) by mouth every 6 (six) hours as needed for moderate pain or headache.  . Alcohol Swabs (B-D SINGLE USE SWABS REGULAR) PADS Use to clean finger before checking blood sugars and clean skin before insulin injections.  . Ascorbic Acid (VITAMIN C) 1000 MG tablet Take 1,000 mg by mouth daily.  Marland Kitchen aspirin EC 81 MG tablet Take 81 mg by mouth daily.  Marland Kitchen esomeprazole (NEXIUM) 20 MG capsule Take 20 mg by mouth daily at 12 noon.  . feeding supplement, GLUCERNA SHAKE, (GLUCERNA SHAKE) LIQD Take 237 mLs by mouth 3 (three) times daily between meals.  . fluticasone (FLONASE) 50 MCG/ACT nasal spray Place 2 sprays into both nostrils daily.  Marland Kitchen glimepiride (AMARYL) 2 MG tablet Take 1 tablet (2 mg total) by mouth 2 (two) times daily.  Marland Kitchen glucose blood (ACCU-CHEK GUIDE) test strip USE TO TEST BLOOD SUGAR 1 TIME DAILY- DX CODE E11.8  . insulin aspart (NOVOLOG FLEXPEN) 100 UNIT/ML FlexPen Inject 4 units before breakfast and 4 units before supper  . insulin degludec (TRESIBA) 100 UNIT/ML SOPN FlexTouch Pen Inject 6 units into the skin once daily (Patient taking differently: 8 Units. Inject 8 units into the skin once daily)  . Insulin Pen Needle (BD PEN NEEDLE NANO U/F) 32G X 4 MM MISC Use to inject insulin once daily  . isosorbide mononitrate (IMDUR) 30 MG 24 hr tablet Take 1 tablet (30 mg total) by mouth daily. One tablet daily  . levocetirizine (XYZAL) 5 MG tablet Take 5 mg by mouth every evening.  .  lovastatin (MEVACOR) 20 MG tablet Take 20 mg by mouth at bedtime.  . metoprolol tartrate (LOPRESSOR) 25 MG tablet Take 25 mg by mouth 2 (two) times daily.  . mirtazapine (REMERON) 15 MG tablet Take 1 tablet (15 mg total) by mouth at bedtime.  . Multiple Vitamins-Minerals (CENTRUM SILVER 50+WOMEN) TABS Take 1 tablet by mouth daily.  . Multiple Vitamins-Minerals (HAIR/SKIN/NAILS/BIOTIN PO) Take 1 tablet by mouth daily.  . multivitamin-lutein (OCUVITE-LUTEIN) CAPS capsule Take 1 capsule by mouth daily.  . nitroGLYCERIN (NITROSTAT) 0.3 MG SL tablet Place 0.3 mg under the tongue every 5 (five) minutes as needed for chest pain.  . Omega-3 Fatty Acids (ULTRA OMEGA-3 FISH OIL PO) Take 1 capsule by mouth daily.  . Probiotic Product (PROBIOTIC FORMULA) CAPS Take 1 capsule by mouth daily.  . Turmeric Curcumin 500 MG CAPS Take 1 capsule by mouth daily.   No facility-administered encounter medications on file as of 08/05/2017.     Allergies (verified) Patient has no known allergies.   History: Past Medical History:  Diagnosis Date  . Anxiety   . Arthritis   . Breast cancer ([place]), bilateral   . Chronic pancreatitis ([place])   . Daily headache   . Depression   . Dizziness   . GERD (gastroesophageal reflux disease)   . Giddiness   . History of hiatal hernia   . Hyperlipemia   .  Hypertension   . IBS (irritable bowel syndrome)   . Mandibular fracture ([place]) 12/04/2015  . MI (myocardial infarction) ([place])   . Skin cancer of face   . Type II diabetes mellitus ([place])    Past Surgical History:  Procedure Laterality Date  . BREAST SURGERY    . CATARACT EXTRACTION W/ INTRAOCULAR LENS  IMPLANT, BILATERAL Bilateral   . CHOLECYSTECTOMY OPEN  1970's  . MASTECTOMY Bilateral    No family history on file. Social History   Occupational History  . Not on file.   Social History Main Topics  . Smoking status: Never Smoker  . Smokeless tobacco: Never Used  . Alcohol use No  . Drug use: No  . Sexual  activity: No    Tobacco Counseling Counseling given: Not Answered   Activities of Daily Living In your present state of health, do you have any difficulty performing the following activities: 05/18/2017  Hearing? N  Vision? N  Difficulty concentrating or making decisions? Y  Walking or climbing stairs? Y  Dressing or bathing? N  Doing errands, shopping? Y  Preparing Food and eating ? N  Using the Toilet? N  In the past six months, have you accidently leaked urine? N  Do you have problems with loss of bowel control? N  Managing your Medications? Y  Managing your Finances? Y  Housekeeping or managing your Housekeeping? Y  Some recent data might be hidden    Immunizations and Health Maintenance Immunization History  Administered Date(s) Administered  . Influenza Whole 08/03/2016  . Influenza, High Dose Seasonal PF 08/09/2015   Health Maintenance Due  Topic Date Due  . OPHTHALMOLOGY EXAM  10/22/1940  . TETANUS/TDAP  10/22/1949  . DEXA SCAN  10/23/1995  . PNA vac Low Risk Adult (1 of 2 - PCV13) 10/23/1995  . INFLUENZA VACCINE  06/24/2017    Patient Care Team: Briscoe Deutscher, DO as PCP - General (Family Medicine)  Indicate any recent Medical Services you may have received from other than [facility] providers in the past year (date may be approximate).     Assessment:   This is a routine wellness examination for Vanessa Gibson. Physical assessment deferred to PCP.   Hearing/Vision screen No exam data present  Dietary issues and exercise activities discussed:    Goals    None     Depression Screen PHQ 2/9 Scores 05/18/2017 05/15/2017 12/04/2015  PHQ - 2 Score 4 6 3   PHQ- 9 Score 18 21 9     Fall Risk Fall Risk  05/18/2017 12/04/2015  Falls in the past year? Yes Yes  Number falls in past yr: 2 or more 2 or more  Injury with Fall? Yes Yes  Risk Factor Category  High Fall Risk High Fall Risk  Risk for fall due to : History of fall(s);Impaired balance/gait;Impaired  mobility;Mental status change History of fall(s);Medication side effect  Follow up Education provided;Falls prevention discussed Education provided    Cognitive Function:        Screening Tests Health Maintenance  Topic Date Due  . OPHTHALMOLOGY EXAM  10/22/1940  . TETANUS/TDAP  10/22/1949  . DEXA SCAN  10/23/1995  . PNA vac Low Risk Adult (1 of 2 - PCV13) 10/23/1995  . INFLUENZA VACCINE  06/24/2017  . HEMOGLOBIN A1C  09/30/2017  . FOOT EXAM  10/20/2017  . URINE MICROALBUMIN  11/14/2017      Plan:   Follow up with PCP as directed.  I have personally reviewed and noted the following in the  patient's chart:   . Medical and social history . Use of alcohol, tobacco or illicit drugs  . Current medications and supplements . Functional ability and status . Nutritional status . Physical activity . Advanced directives . List of other physicians . Vitals . Screenings to include cognitive, depression, and falls . Referrals and appointments  In addition, I have reviewed and discussed with patient certain preventive protocols, quality metrics, and best practice recommendations. A written personalized care plan for preventive services as well as general preventive health recommendations were provided to patient.     Ree Edman, RN   07/30/2017

## 2017-07-30 NOTE — Progress Notes (Deleted)
Pre visit review using our clinic review tool, if applicable. No additional management support is needed unless otherwise documented below in the visit note. 

## 2017-08-05 ENCOUNTER — Ambulatory Visit: Payer: Medicare HMO | Admitting: Family Medicine

## 2017-08-05 ENCOUNTER — Ambulatory Visit: Payer: Medicare HMO | Admitting: *Deleted

## 2017-08-06 ENCOUNTER — Telehealth: Payer: Self-pay | Admitting: Family Medicine

## 2017-08-06 ENCOUNTER — Other Ambulatory Visit: Payer: Self-pay | Admitting: Pharmacist

## 2017-08-06 NOTE — Telephone Encounter (Signed)
Called Vanessa Gibson with [employer] and left a message to please have her send the form back to [place] that needs to be completed and to please fax to [phone].

## 2017-08-06 NOTE — Telephone Encounter (Signed)
Routing to endocrinology because they filled out this paperwork for the patient.  Thank you.

## 2017-08-06 NOTE — Telephone Encounter (Signed)
Alwyn Ren from [facility] states that patient assistance forms that were filled out for patient that the 3rd page needs to be completed because the medication names were left off. Please complete and fax back to [place] at [facility]. The medications were for tresiba and novolog. The fax number is [phone].

## 2017-08-06 NOTE — Patient Outreach (Signed)
[place] [facility]) Care Management  08/06/2017  Vanessa Gibson April 26, 1928 [id number]   Patient's son-in-law Vanessa Gibson called regarding patient assistance applications for the patient.  HIPAA identifiers were obtained. Vanessa Gibson said they received a letter stating the patient was being denied from [employer] but the letter did not say why patient was being denied.    [employer] was called on the patient's behalf.  The representative from [employer] said the provider's office did not write any products on page 3 of the application and screen shots of the patient's out-of-pocket expenditures.  This message was relayed to Vanessa Gibson who said he would fax the new estimation of benefits prescription summary to the [facility] office.    Dr. Alcario Drought office was called and a message was left for her CMA about the application.  Plan:  Await a call from Dr. Alcario Drought office Follow up on everything in 2-3 business days.  [name], PharmD, [place] Clinical Pharmacist [phone]

## 2017-08-06 NOTE — Telephone Encounter (Signed)
Patients son wants to know if she has been evaluated for dementia.  He will also be faxing forms for advance directives.  He wants to know if we have pulled records from Dr. Wynn Banker in [place].  Holland Falling P: [phone]  Please call back about cognitive evaluation.  Ty,  -LL

## 2017-08-07 ENCOUNTER — Other Ambulatory Visit: Payer: Self-pay | Admitting: Pharmacist

## 2017-08-07 NOTE — Telephone Encounter (Signed)
Papers have been filled out, signed, and faxed. I have also scheduled her a f/u appointment in october

## 2017-08-07 NOTE — Patient Outreach (Signed)
[facility]) Care Management  08/07/2017  Roneka Gilpin [birth year] [id number]   Megan from Dr. Ronnie Derby office called and requested a copy of the form needing completion be faxed to their office. A fax cover sheet was prepared and sent to [facility], CPhT along with a link to needed application to be faxed to Dr. Ronnie Derby office.   Patient's son-in-law was called to provide an update on the application.  HIPAA identifiers were obtained.  Plan:  Once the application is received from the provider, it will be faxed along with the TROOP to [employer].  Follow up on process in 2-3 business days.   [name], PharmD, [place] Clinical Pharmacist [phone] .

## 2017-08-10 ENCOUNTER — Other Ambulatory Visit: Payer: Self-pay | Admitting: Pharmacist

## 2017-08-10 NOTE — Patient Outreach (Signed)
[place] [facility]) Care Management  08/10/2017  Vanessa Gibson August 18, 1928 [id number]   The TROOP document faxed by the patient's son-in-law and the completed missing application form from Dr. Ronnie Derby office were faxed to [employer] by [employer], CPhT today.    Patient's son was updated on the status of the application.  HIPAA identifiers were obtained. Patient's son-in-law is on her consent.  Plan:  Follow up with [employer] and the patient's son-in-law next week on the status of the application.   [name], PharmD, [place] Clinical Pharmacist [phone]

## 2017-08-11 ENCOUNTER — Ambulatory Visit: Payer: Self-pay | Admitting: Pharmacist

## 2017-08-11 NOTE — Progress Notes (Signed)
PCP notes:   Health maintenance: Tdap: Unsure of last date.  Dexa: Decline. PCV 13: Pt states she has received all pneumonia vaccines.  Flu: Pt thinks that she will get it through her facility.   Abnormal screenings: PHQ9 - 16, GAD 7 -10   Patient concerns: Depression, incontinence, constipation, anxiety.    Nurse concerns: Depression.    Next PCP appt: 08/12/17.

## 2017-08-11 NOTE — Progress Notes (Signed)
Subjective:   Vanessa Gibson is a 83 y.o. female who presents for Medicare Annual (Subsequent) preventive examination.  Review of Systems:  No ROS.  Medicare Wellness Visit. Additional risk factors are reflected in the social history.  Cardiac Risk Factors include: advanced age (>55men, >89 women);diabetes mellitus;dyslipidemia;hypertension;sedentary lifestyle     Objective:     Vitals: BP 126/74   Pulse 75   Temp 98.7 F (37.1 C) (Oral)   Resp 16   Ht 4\' 11"  (1.499 m)   Wt 116 lb 9.6 oz (52.9 kg)   SpO2 97%   BMI 23.55 kg/m   Body mass index is 23.55 kg/m.   Tobacco History  Smoking Status  . Never Smoker  Smokeless Tobacco  . Never Used     Counseling given: Not Answered   Past Medical History:  Diagnosis Date  . Anxiety   . Arthritis   . Breast cancer ([place]), bilateral   . Chronic pancreatitis ([place])   . Daily headache   . Depression   . Dizziness   . GERD (gastroesophageal reflux disease)   . Giddiness   . History of hiatal hernia   . Hyperlipemia   . Hypertension   . IBS (irritable bowel syndrome)   . Mandibular fracture ([place]) 12/04/2015  . MI (myocardial infarction) ([place])   . Skin cancer of face   . Type II diabetes mellitus ([place])    Past Surgical History:  Procedure Laterality Date  . BREAST SURGERY    . CATARACT EXTRACTION W/ INTRAOCULAR LENS  IMPLANT, BILATERAL Bilateral   . CHOLECYSTECTOMY OPEN  1970's  . MASTECTOMY Bilateral    No family history on file. History  Sexual Activity  . Sexual activity: No    Outpatient Encounter Prescriptions as of 08/12/2017  Medication Sig  . Alpha-D-Galactosidase (BEANO PO) Take by mouth as needed.  . calcium carbonate (TUMS - DOSED IN MG ELEMENTAL CALCIUM) 500 MG chewable tablet Chew 1 tablet by mouth daily.  . magnesium oxide (MAG-OX) 400 MG tablet Take 400 mg by mouth daily as needed.  . polyethylene glycol (MIRALAX / GLYCOLAX) packet Take 17 g by mouth daily as needed.  Marland Kitchen acetaminophen (TYLENOL) 325  MG tablet Take 2 tablets (650 mg total) by mouth every 6 (six) hours as needed for moderate pain or headache.  . Alcohol Swabs (B-D SINGLE USE SWABS REGULAR) PADS Use to clean finger before checking blood sugars and clean skin before insulin injections.  . Ascorbic Acid (VITAMIN C) 1000 MG tablet Take 1,000 mg by mouth daily.  Marland Kitchen aspirin EC 81 MG tablet Take 81 mg by mouth daily.  Marland Kitchen esomeprazole (NEXIUM) 20 MG capsule Take 20 mg by mouth daily at 12 noon.  . feeding supplement, GLUCERNA SHAKE, (GLUCERNA SHAKE) LIQD Take 237 mLs by mouth 3 (three) times daily between meals.  . fluticasone (FLONASE) 50 MCG/ACT nasal spray Place 2 sprays into both nostrils daily.  Marland Kitchen glimepiride (AMARYL) 2 MG tablet Take 1 tablet (2 mg total) by mouth 2 (two) times daily. (Patient taking differently: Take 2 mg by mouth daily. )  . glucose blood (ACCU-CHEK GUIDE) test strip USE TO TEST BLOOD SUGAR 1 TIME DAILY- DX CODE E11.8  . insulin aspart (NOVOLOG FLEXPEN) 100 UNIT/ML FlexPen Inject 4 units before breakfast and 4 units before supper  . insulin degludec (TRESIBA) 100 UNIT/ML SOPN FlexTouch Pen Inject 6 units into the skin once daily (Patient taking differently: 8 Units. Inject 8 units into the skin once daily)  .  Insulin Pen Needle (BD PEN NEEDLE NANO U/F) 32G X 4 MM MISC Use to inject insulin once daily  . isosorbide mononitrate (IMDUR) 30 MG 24 hr tablet Take 1 tablet (30 mg total) by mouth daily. One tablet daily  . levocetirizine (XYZAL) 5 MG tablet Take 5 mg by mouth every evening.  . lovastatin (MEVACOR) 20 MG tablet Take 20 mg by mouth at bedtime.  . metoprolol tartrate (LOPRESSOR) 25 MG tablet Take 25 mg by mouth 2 (two) times daily.  . mirtazapine (REMERON) 15 MG tablet Take 1 tablet (15 mg total) by mouth at bedtime.  . Multiple Vitamins-Minerals (CENTRUM SILVER 50+WOMEN) TABS Take 1 tablet by mouth daily.  . Multiple Vitamins-Minerals (HAIR/SKIN/NAILS/BIOTIN PO) Take 1 tablet by mouth daily.  .  multivitamin-lutein (OCUVITE-LUTEIN) CAPS capsule Take 1 capsule by mouth daily.  . nitroGLYCERIN (NITROSTAT) 0.3 MG SL tablet Place 0.3 mg under the tongue every 5 (five) minutes as needed for chest pain.  . Omega-3 Fatty Acids (ULTRA OMEGA-3 FISH OIL PO) Take 1 capsule by mouth daily.  . Probiotic Product (PROBIOTIC FORMULA) CAPS Take 1 capsule by mouth daily.  . Turmeric Curcumin 500 MG CAPS Take 1 capsule by mouth daily.   No facility-administered encounter medications on file as of 08/12/2017.     Activities of Daily Living In your present state of health, do you have any difficulty performing the following activities: 08/12/2017 05/18/2017  Hearing? N N  Vision? N N  Difficulty concentrating or making decisions? N Y  Walking or climbing stairs? N Y  Dressing or bathing? N N  Doing errands, shopping? N Y  Conservation officer, nature and eating ? N N  Using the Toilet? N N  In the past six months, have you accidently leaked urine? Y N  Do you have problems with loss of bowel control? N N  Managing your Medications? N Y  Managing your Finances? N Y  Housekeeping or managing your Housekeeping? N Y  Some recent data might be hidden    Patient Care Team: Briscoe Deutscher, DO as PCP - General (Family Medicine) [name], [facility] as [place] [facility] (Pharmacist) Zadie Rhine Clent Demark, MD as Consulting Physician (Ophthalmology)    Assessment:    Physical assessment deferred to PCP.  Exercise Activities and Dietary recommendations Current Exercise Habits: The patient does not participate in regular exercise at present, Exercise limited by: None identified  Goals    None     Fall Risk Fall Risk  08/12/2017 05/18/2017 12/04/2015  Falls in the past year? No Yes Yes  Number falls in past yr: - 2 or more 2 or more  Injury with Fall? - Yes Yes  Risk Factor Category  - High Fall Risk High Fall Risk  Risk for fall due to : - History of fall(s);Impaired balance/gait;Impaired  mobility;Mental status change History of fall(s);Medication side effect  Follow up - Education provided;Falls prevention discussed Education provided   Depression Screen PHQ 2/9 Scores 08/12/2017 05/18/2017 05/15/2017 12/04/2015  PHQ - 2 Score 4 4 6 3   PHQ- 9 Score 16 18 21 9      Cognitive Function        Immunization History  Administered Date(s) Administered  . Influenza Whole 08/03/2016  . Influenza, High Dose Seasonal PF 08/09/2015  . Zoster 01/17/2016   Screening Tests Health Maintenance  Topic Date Due  . PNA vac Low Risk Adult (1 of 2 - PCV13) 10/23/1995  . INFLUENZA VACCINE  06/24/2017  . DEXA  SCAN  08/12/2018 (Originally 10/23/1995)  . TETANUS/TDAP  08/12/2018 (Originally 10/22/1947)  . OPHTHALMOLOGY EXAM  08/18/2017  . HEMOGLOBIN A1C  09/30/2017  . FOOT EXAM  10/20/2017  . URINE MICROALBUMIN  11/14/2017      Plan:   Follow up with PCP as directed.  I have personally reviewed and noted the following in the patient's chart:   . Medical and social history . Use of alcohol, tobacco or illicit drugs  . Current medications and supplements . Functional ability and status . Nutritional status . Physical activity . Advanced directives . List of other physicians . Vitals . Screenings to include cognitive, depression, and falls . Referrals and appointments  In addition, I have reviewed and discussed with patient certain preventive protocols, quality metrics, and best practice recommendations. A written personalized care plan for preventive services as well as general preventive health recommendations were provided to patient.     Ree Edman, RN  08/12/2017

## 2017-08-11 NOTE — Progress Notes (Signed)
Pre visit review using our clinic review tool, if applicable. No additional management support is needed unless otherwise documented below in the visit note. 

## 2017-08-12 ENCOUNTER — Ambulatory Visit (INDEPENDENT_AMBULATORY_CARE_PROVIDER_SITE_OTHER): Payer: Medicare HMO | Admitting: *Deleted

## 2017-08-12 ENCOUNTER — Ambulatory Visit (INDEPENDENT_AMBULATORY_CARE_PROVIDER_SITE_OTHER): Payer: Medicare HMO | Admitting: Family Medicine

## 2017-08-12 ENCOUNTER — Encounter: Payer: Self-pay | Admitting: Family Medicine

## 2017-08-12 ENCOUNTER — Encounter: Payer: Self-pay | Admitting: *Deleted

## 2017-08-12 VITALS — BP 126/74 | HR 75 | Temp 98.7°F | Ht 59.0 in | Wt 116.6 lb

## 2017-08-12 VITALS — BP 126/74 | HR 75 | Temp 98.7°F | Resp 16 | Ht 59.0 in | Wt 116.6 lb

## 2017-08-12 DIAGNOSIS — Z0000 Encounter for general adult medical examination without abnormal findings: Secondary | ICD-10-CM | POA: Diagnosis not present

## 2017-08-12 DIAGNOSIS — M1990 Unspecified osteoarthritis, unspecified site: Secondary | ICD-10-CM

## 2017-08-12 DIAGNOSIS — J301 Allergic rhinitis due to pollen: Secondary | ICD-10-CM | POA: Diagnosis not present

## 2017-08-12 DIAGNOSIS — F329 Major depressive disorder, single episode, unspecified: Secondary | ICD-10-CM

## 2017-08-12 DIAGNOSIS — E43 Unspecified severe protein-calorie malnutrition: Secondary | ICD-10-CM | POA: Diagnosis not present

## 2017-08-12 MED ORDER — TRAMADOL HCL 50 MG PO TABS: ORAL_TABLET | ORAL | 2 refills | Status: DC

## 2017-08-12 MED ORDER — FLUTICASONE PROPIONATE 50 MCG/ACT NA SUSP: 2.0000 | Freq: Every day | NASAL | 0 refills | Status: DC

## 2017-08-12 NOTE — Progress Notes (Signed)
I have personally reviewed the Medicare Annual Wellness questionnaire and have noted 1. The patient's medical and social history 2. Their use of alcohol, tobacco or illicit drugs 3. Their current medications and supplements 4. The patient's functional ability including ADL's, fall risks, home safety risks and hearing or visual impairment. 5. Diet and physical activities 6. Evidence for depression or mood disorders 7. Reviewed Updated provider list, see scanned forms and [facility] Snapshot.   The patients weight, height, BMI and visual acuity have been recorded in the chart I have made referrals, counseling and provided education to the patient based review of the above and I have provided the pt with a written personalized care plan for preventive services.  I have provided the patient with a copy of your personalized plan for preventive services. Instructed to take the time to review along with their updated medication list.   Kayleena Eke, D.O. Family Medicine [facility]  

## 2017-08-12 NOTE — Progress Notes (Signed)
Vanessa Gibson is a 83 y.o. female is here for follow up.  History of Present Illness:   Water quality scientist, CMA, acting as scribe for Dr. Juleen China.  HPI:  Patient comes in today for follow up.   She states she was not taking the Remeron but started taking it because she needed to sleep.  She states she has not been taking it every night, though.  States that it helps her sleep but her head feels very "heavy" during the day and she is very tired all the time.  She is also in pain all the time.  I have offered changing her to Prozac for mood along with medication for pain.  Patient declined this.  I have advised her to stop the Remeron and start tramadol 50 mg one tablet once or twice daily for pain.  This will hopefully help her sleep at night as well.  She has been having severe constipation.  States she thinks glimepiride has been causing this.  She has only been taking 1 tablet of glimepiride.  States that she has difficulty when starting a bowel movement but then it becomes easier.  States she finally had a bowel movement today but states she doesn't feel "clean".  She is taking several different things for constipation including MiraLAX, Beano, Gaviscon.  She states she is going to stop all of those and only take MiraLax.  I have asked her to add Metamucil as well, but she declines.  She states she is only willing to take MiraLAX.  Patient complains that her ears hurt and that her head feels "heavy".  She was referred to ENT but has not been to see them.  Upon examination, her ears do have a small amount of fluid in them.  I will treat for sinus infection.  I have offered an antibiotic.  Patient declines.  States an antibiotic will hurt her stomach.  She states she will try Flonase instead.  I have also offered to speak with our referral coordinator to see if we can speed the referral process along to get her an earlier appointment.  She declines this and would like to try the Flonase instead.  She also  states that her legs feel very weak.  I have offered physical therapy here at our office but patient declines.  She states she can get therapy here at her facility.  She states our health coach is "looking into that" for her.  Patient is very unhappy living in the facility that she lives in.  States she was very happy living in [place] and was made to move to [place] 2 years ago.  States she is not unhappy living in [place], but she doesn't like living in the place where she lives.    Health Maintenance Due  Topic Date Due  . PNA vac Low Risk Adult (1 of 2 - PCV13) 10/23/1995  . INFLUENZA VACCINE  06/24/2017  . OPHTHALMOLOGY EXAM  08/18/2017   Depression screen [facility] 2/9 08/12/2017 05/18/2017 05/15/2017  Decreased Interest 2 2 3   Down, Depressed, Hopeless 2 2 3   PHQ - 2 Score 4 4 6   Altered sleeping 3 3 3   Tired, decreased energy 3 3 3   Change in appetite 1 2 2   Feeling bad or failure about yourself  2 2 3   Trouble concentrating 3 3 3   Moving slowly or fidgety/restless - 1 1  Suicidal thoughts 0 0 0  PHQ-9 Score 16 18 21   Difficult doing  work/chores Very difficult Very difficult Very difficult   PMHx, SurgHx, SocialHx, FamHx, Medications, and Allergies were reviewed in the Visit Navigator and updated as appropriate.   Patient Active Problem List   Diagnosis Date Noted  . Dysthymia 06/06/2017  . Vision changes 06/06/2017  . Insomnia 06/06/2017  . CKD (chronic kidney disease) stage 3, GFR 30-59 ml/min 05/26/2017  . Osteoporosis, post-menopausal 02/06/2016  . Bilateral hearing loss 02/06/2016  . Arthritis 02/06/2016  . Anemia of chronic disease 12/05/2015  . Protein-calorie malnutrition, severe 11/13/2015  . Chronic pancreatitis ([place]) 11/13/2015  . Diabetes mellitus, type II ([place]) 11/13/2015  . Esophageal reflux 11/13/2015  . Benign essential hypertension 11/13/2015  . Hyperlipidemia 11/13/2015   Social History  Substance Use Topics  . Smoking status: Never Smoker    . Smokeless tobacco: Never Used  . Alcohol use No   Current Medications and Allergies:   Current Outpatient Prescriptions:  .  acetaminophen (TYLENOL) 325 MG tablet, Take 2 tablets (650 mg total) by mouth every 6 (six) hours as needed for moderate pain or headache., Disp: , Rfl:  .  Alcohol Swabs (B-D SINGLE USE SWABS REGULAR) PADS, Use to clean finger before checking blood sugars and clean skin before insulin injections., Disp: 200 each, Rfl: 2 .  Ascorbic Acid (VITAMIN C) 1000 MG tablet, Take 1,000 mg by mouth daily., Disp: , Rfl:  .  aspirin EC 81 MG tablet, Take 81 mg by mouth daily., Disp: , Rfl:  .  esomeprazole (NEXIUM) 20 MG capsule, Take 20 mg by mouth daily at 12 noon., Disp: , Rfl:  .  feeding supplement, GLUCERNA SHAKE, (GLUCERNA SHAKE) LIQD, Take 237 mLs by mouth 3 (three) times daily between meals., Disp: , Rfl: 0 .  glimepiride (AMARYL) 2 MG tablet, Take 1 tablet (2 mg total) by mouth 2 (two) times daily. (Patient taking differently: Take 2 mg by mouth daily. ), Disp: 180 tablet, Rfl: 1 .  glucose blood (ACCU-CHEK GUIDE) test strip, USE TO TEST BLOOD SUGAR 1 TIME DAILY- DX CODE E11.8, Disp: 100 each, Rfl: 5 .  insulin aspart (NOVOLOG FLEXPEN) 100 UNIT/ML FlexPen, Inject 4 units before breakfast and 4 units before supper, Disp: 5 pen, Rfl: 4 .  insulin degludec (TRESIBA) 100 UNIT/ML SOPN FlexTouch Pen, Inject 6 units into the skin once daily (Patient taking differently: 8 Units. Inject 8 units into the skin once daily), Disp: 18 pen, Rfl: 1 .  Insulin Pen Needle (BD PEN NEEDLE NANO U/F) 32G X 4 MM MISC, Use to inject insulin once daily, Disp: 90 each, Rfl: 2 .  isosorbide mononitrate (IMDUR) 30 MG 24 hr tablet, Take 1 tablet (30 mg total) by mouth daily. One tablet daily, Disp: 30 tablet, Rfl: 0 .  levocetirizine (XYZAL) 5 MG tablet, Take 5 mg by mouth every evening., Disp: , Rfl:  .  lovastatin (MEVACOR) 20 MG tablet, Take 20 mg by mouth at bedtime., Disp: , Rfl:  .  metoprolol  tartrate (LOPRESSOR) 25 MG tablet, Take 25 mg by mouth 2 (two) times daily., Disp: , Rfl:  .  Multiple Vitamins-Minerals (CENTRUM SILVER 50+WOMEN) TABS, Take 1 tablet by mouth daily., Disp: , Rfl:  .  Multiple Vitamins-Minerals (HAIR/SKIN/NAILS/BIOTIN PO), Take 1 tablet by mouth daily., Disp: , Rfl:  .  multivitamin-lutein (OCUVITE-LUTEIN) CAPS capsule, Take 1 capsule by mouth daily., Disp: , Rfl:  .  nitroGLYCERIN (NITROSTAT) 0.3 MG SL tablet, Place 0.3 mg under the tongue every 5 (five) minutes as needed for chest pain., Disp: ,  Rfl:  .  Omega-3 Fatty Acids (ULTRA OMEGA-3 FISH OIL PO), Take 1 capsule by mouth daily., Disp: , Rfl:  .  Probiotic Product (PROBIOTIC FORMULA) CAPS, Take 1 capsule by mouth daily., Disp: , Rfl:  .  Turmeric Curcumin 500 MG CAPS, Take 1 capsule by mouth daily., Disp: , Rfl:  .  Alpha-D-Galactosidase (BEANO PO), Take by mouth as needed., Disp: , Rfl:  .  calcium carbonate (TUMS - DOSED IN MG ELEMENTAL CALCIUM) 500 MG chewable tablet, Chew 1 tablet by mouth daily., Disp: , Rfl:  .  fluticasone (FLONASE) 50 MCG/ACT nasal spray, Place 2 sprays into both nostrils daily., Disp: 16 g, Rfl: 0 .  magnesium oxide (MAG-OX) 400 MG tablet, Take 400 mg by mouth daily as needed., Disp: , Rfl:  .  polyethylene glycol (MIRALAX / GLYCOLAX) packet, Take 17 g by mouth daily as needed., Disp: , Rfl:  .  traMADol (ULTRAM) 50 MG tablet, Take 1 tablet one to two times per day, Disp: 60 tablet, Rfl: 2  No Known Allergies   Review of Systems   Pertinent items are noted in the HPI. Otherwise, ROS is negative.  Vitals:   Vitals:   08/12/17 1346  BP: 126/74  Pulse: 75  Temp: 98.7 F (37.1 C)  TempSrc: Oral  SpO2: 97%  Weight: 116 lb 9.6 oz (52.9 kg)  Height: 4\' 11"  (1.499 m)     Body mass index is 23.55 kg/m.   Physical Exam:   Physical Exam  Constitutional: She appears well-nourished.  HENT:  Head: Normocephalic and atraumatic.  Eyes: Pupils are equal, round, and reactive  to light. EOM are normal.  Neck: Normal range of motion. Neck supple.  Cardiovascular: Normal rate, regular rhythm, normal heart sounds and intact distal pulses.   Pulmonary/Chest: Effort normal.  Abdominal: Soft.  Skin: Skin is warm.  Psychiatric: She has a normal mood and affect. Her behavior is normal.  Nursing note and vitals reviewed.   Assessment and Plan:   [place] was seen today for follow-up.  Diagnoses and all orders for this visit:  Reactive depression Comments: See HPI.  Arthritis Comments: Refill of prn pain medication. Orders: -     traMADol (ULTRAM) 50 MG tablet; Take 1 tablet one to two times per day  Protein-calorie malnutrition, severe Comments: Continue supplemental shakes.  Seasonal allergic rhinitis due to pollen -     fluticasone (FLONASE) 50 MCG/ACT nasal spray; Place 2 sprays into both nostrils daily.  . Reviewed expectations re: course of current medical issues. . Discussed self-management of symptoms. . Outlined signs and symptoms indicating need for more acute intervention. . Patient verbalized understanding and all questions were answered. Marland Kitchen Health Maintenance issues including appropriate healthy diet, exercise, and smoking avoidance were discussed with patient. . See orders for this visit as documented in the electronic medical record. . Patient received an After Visit Summary.  CMA served as Education administrator during this visit. History, Physical, and Plan performed by medical provider. The above documentation has been reviewed and is accurate and complete. Briscoe Deutscher, D.O.  Briscoe Deutscher, DO [place], [place] 08/23/2017  Future Appointments Date Time Provider [place]  09/02/2017 9:30 AM [name], [facility] None  09/21/2017 10:15 AM Elayne Snare, MD [facility] None

## 2017-08-12 NOTE — Patient Instructions (Signed)
Vanessa Gibson , Thank you for taking time to come for your Medicare Wellness Visit. I appreciate your ongoing commitment to your health goals. Please review the following plan we discussed and let me know if I can assist you in the future.   These are the goals we discussed: Goals    None      This is a list of the screening recommended for you and due dates:  Health Maintenance  Topic Date Due  . Pneumonia vaccines (1 of 2 - PCV13) 10/23/1995  . Flu Shot  06/24/2017  . DEXA scan (bone density measurement)  08/12/2018*  . Tetanus Vaccine  08/12/2018*  . Eye exam for diabetics  08/18/2017  . Hemoglobin A1C  09/30/2017  . Complete foot exam   10/20/2017  . Urine Protein Check  11/14/2017  *Topic was postponed. The date shown is not the original due date.   Preventive Care for Adults  A healthy lifestyle and preventive care can promote health and wellness. Preventive health guidelines for adults include the following key practices.  . A routine yearly physical is a good way to check with your health care provider about your health and preventive screening. It is a chance to share any concerns and updates on your health and to receive a thorough exam.  . Visit your dentist for a routine exam and preventive care every 6 months. Brush your teeth twice a day and floss once a day. Good oral hygiene prevents tooth decay and gum disease.  . The frequency of eye exams is based on your age, health, family medical history, use  of contact lenses, and other factors. Follow your health care provider's ecommendations for frequency of eye exams.  . Eat a healthy diet. Foods like vegetables, fruits, whole grains, low-fat dairy products, and lean protein foods contain the nutrients you need without too many calories. Decrease your intake of foods high in solid fats, added sugars, and salt. Eat the right amount of calories for you. Get information about a proper diet from your health care provider, if  necessary.  . Regular physical exercise is one of the most important things you can do for your health. Most adults should get at least 150 minutes of moderate-intensity exercise (any activity that increases your heart rate and causes you to sweat) each week. In addition, most adults need muscle-strengthening exercises on 2 or more days a week.  Silver Sneakers may be a benefit available to you. To determine eligibility, you may visit the website: www.silversneakers.com or contact program at (548)415-4887 Mon-Fri between 8AM-8PM.   . Maintain a healthy weight. The body mass index (BMI) is a screening tool to identify possible weight problems. It provides an estimate of body fat based on height and weight. Your health care provider can find your BMI and can help you achieve or maintain a healthy weight.   For adults 20 years and older: ? A BMI below 18.5 is considered underweight. ? A BMI of 18.5 to 24.9 is normal. ? A BMI of 25 to 29.9 is considered overweight. ? A BMI of 30 and above is considered obese.   . Maintain normal blood lipids and cholesterol levels by exercising and minimizing your intake of saturated fat. Eat a balanced diet with plenty of fruit and vegetables. Blood tests for lipids and cholesterol should begin at age 57 and be repeated every 5 years. If your lipid or cholesterol levels are high, you are over 50, or you are at high  risk for heart disease, you may need your cholesterol levels checked more frequently. Ongoing high lipid and cholesterol levels should be treated with medicines if diet and exercise are not working.  . If you smoke, find out from your health care provider how to quit. If you do not use tobacco, please do not start.  . If you choose to drink alcohol, please do not consume more than 2 drinks per day. One drink is considered to be 12 ounces (355 mL) of beer, 5 ounces (148 mL) of wine, or 1.5 ounces (44 mL) of liquor.  . If you are 10-62 years old, ask your  health care provider if you should take aspirin to prevent strokes.  . Use sunscreen. Apply sunscreen liberally and repeatedly throughout the day. You should seek shade when your shadow is shorter than you. Protect yourself by wearing long sleeves, pants, a wide-brimmed hat, and sunglasses year round, whenever you are outdoors.  . Once a month, do a whole body skin exam, using a mirror to look at the skin on your back. Tell your health care provider of new moles, moles that have irregular borders, moles that are larger than a pencil eraser, or moles that have changed in shape or color.

## 2017-08-12 NOTE — Patient Instructions (Addendum)
STOP REMERON   START TRAMADOL.  TAKE 1 TABLET ONE TO TWO TIMES PER DAY

## 2017-08-14 NOTE — Telephone Encounter (Signed)
Per Dr. Juleen China, patient's son will need to accompany patient to her next appointment if he would like to discuss cognitive issues.  LM for him to return call.

## 2017-08-17 ENCOUNTER — Other Ambulatory Visit: Payer: Self-pay | Admitting: Pharmacist

## 2017-08-17 DIAGNOSIS — J300 Vasomotor rhinitis: Secondary | ICD-10-CM | POA: Diagnosis not present

## 2017-08-17 DIAGNOSIS — G44201 Tension-type headache, unspecified, intractable: Secondary | ICD-10-CM | POA: Diagnosis not present

## 2017-08-17 DIAGNOSIS — H903 Sensorineural hearing loss, bilateral: Secondary | ICD-10-CM | POA: Diagnosis not present

## 2017-08-17 NOTE — Patient Outreach (Signed)
[facility]) Care Management  08/17/2017  Lakea Mittelman [birth year] [phone]   [place] Patient Assistance Program on the patient's behalf.  The representative confirmed patient is approved through the end of the year to receive [name], Novolog and [name] needles. Patient should receive a 3 month supply within the next 7-10 business days.  Patient's son-in-law was called.  HIPAA identifiers were obtained. Status of application was communicated.  Plan:  Call patient/patient's son-in-law back within 12-14 business days to check on the shipment.   [name], PharmD, [place] Clinical Pharmacist [phone]

## 2017-09-02 ENCOUNTER — Other Ambulatory Visit: Payer: Self-pay | Admitting: Pharmacist

## 2017-09-02 ENCOUNTER — Telehealth: Payer: Self-pay | Admitting: Family Medicine

## 2017-09-02 NOTE — Patient Outreach (Signed)
[facility]) Care Management  09/02/2017  Vanessa Gibson [birth year] [id number]  Patient's son-in-law Vanessa Gibson) was called to follow up on medication assistance. HIPAA identifiers were obtained.  Vanessa Gibson confirmed patient had not received any medications. Novo Nordisk was called. The representative confirmed the medications had been delivered to Dr. Ronnie Gibson office and was signed for by Vanessa Gibson.  Dr. Ronnie Gibson office was called and it was confirmed Vanessa Gibson, Novolog, and [name] had been delivered to their office.    Vanessa Gibson was called back and notified. He communicated understanding and said he would have someone go and pick up the medications from Dr. Ronnie Gibson office.  Plan: Close patient's case.   [name], PharmD, [place] Clinical Pharmacist [phone]

## 2017-09-02 NOTE — Telephone Encounter (Signed)
Patient wants to know if Novolog and Tresiba shipped to our office for her?  Ty,  -LL

## 2017-09-02 NOTE — Telephone Encounter (Signed)
Spoke with patient and advised that her insulin is located at Dr. Ronnie Derby office (see [facility] note message from today).  Patient's son-in-law is making arrangements to have medication picked up for the patient.  Patient verbalized understanding and is grateful for the information.

## 2017-09-10 ENCOUNTER — Telehealth: Payer: Self-pay | Admitting: Endocrinology

## 2017-09-10 NOTE — Telephone Encounter (Signed)
She can stop this and continue insulin

## 2017-09-10 NOTE — Telephone Encounter (Signed)
Pt called stating that she cannot continue to take glimepiride (AMARYL) 2 MG. It is causing her to be terribly constipated. She has cut the dose down to 1 a day but has not had any relief. Please advise.

## 2017-09-10 NOTE — Telephone Encounter (Signed)
Please advise on the note below 
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
## 2017-09-17 ENCOUNTER — Telehealth: Payer: Self-pay | Admitting: Pharmacist

## 2017-09-17 NOTE — Patient Outreach (Signed)
[facility]) Care Management  09/17/2017  Knox Holdman 09-24-1928 [id number]   Patient's son-in-law called and asked that the patient be called because she said the medication delivered to her was wrong. HIPAA identifiers were obtained. Patient was called. After discussing with the patient and her son-in-law, it was discovered that Novolog vials were delivered to her instead of Novolog Flex Pens.  Novo Nordisk was called on the patient's behalf. Unfortunately, their program does not supply the vials. This was explained to the patient and her son-in-law. Patient said she will continue to purchase Novolog Flex Pens.  Patient received a four month supply of Tresiba Pens and Novolog Vials.  Plan: Patient's son-in-law will follow up next year when patient gets close to spending $1000 in medication expenses.    [name], PharmD, [place] Clinical Pharmacist [phone]

## 2017-09-21 ENCOUNTER — Other Ambulatory Visit: Payer: Self-pay

## 2017-09-21 ENCOUNTER — Encounter: Payer: Self-pay | Admitting: Endocrinology

## 2017-09-21 ENCOUNTER — Ambulatory Visit (INDEPENDENT_AMBULATORY_CARE_PROVIDER_SITE_OTHER): Payer: Medicare HMO | Admitting: Endocrinology

## 2017-09-21 VITALS — BP 130/76 | HR 64 | Ht 59.0 in | Wt 113.6 lb

## 2017-09-21 DIAGNOSIS — E1165 Type 2 diabetes mellitus with hyperglycemia: Secondary | ICD-10-CM | POA: Diagnosis not present

## 2017-09-21 DIAGNOSIS — Z794 Long term (current) use of insulin: Secondary | ICD-10-CM

## 2017-09-21 LAB — GLUCOSE, POCT (MANUAL RESULT ENTRY): POC GLUCOSE: 101 mg/dL — AB (ref 70–99)

## 2017-09-21 LAB — POCT GLYCOSYLATED HEMOGLOBIN (HGB A1C): Hemoglobin A1C: 6.8

## 2017-09-21 MED ORDER — GLUCOSE BLOOD VI STRP: ORAL_STRIP | 5 refills | Status: DC

## 2017-09-21 MED ORDER — INSULIN PEN NEEDLE 32G X 4 MM MISC: 2 refills | Status: DC

## 2017-09-21 MED ORDER — METOPROLOL TARTRATE 25 MG PO TABS: 25.0000 mg | ORAL_TABLET | Freq: Two times a day (BID) | ORAL | 2 refills | Status: DC

## 2017-09-21 MED ORDER — INSULIN ASPART 100 UNIT/ML FLEXPEN: PEN_INJECTOR | SUBCUTANEOUS | 4 refills | Status: DC

## 2017-09-21 NOTE — Patient Instructions (Addendum)
Tresiba 10 units   Take 3 Novolog in and 4 at dinner  Check blood sugars on waking up  3/7 days  Also check blood sugars about 2 hours after a meal and do this after different meals by rotation  Recommended blood sugar levels on waking up is 90-130 and about 2 hours after meal is 130-180  Please bring your blood sugar monitor to each visit, thank you

## 2017-09-21 NOTE — Progress Notes (Signed)
Patient ID: Vanessa Gibson, female   DOB: [birth year], 83 y.o.   MRN: [id number]           Reason for Appointment: Follow-up for Type 2 Diabetes  Referring physician: Drema Dallas   History of Present Illness:          Date of diagnosis of type 2 diabetes mellitus: 1997        Background history:   She had been treated with metformin mostly since her diagnosis At some point was given glipizide/metformin combination, detailed history is not available from PCP Also not clear what level of control she has had until recently  Recent history:   Non-insulin hypoglycemic drugs the patient is taking are: none   INSULIN dose: Tresiba 9 units daily at night,  4 units at breakfast and supper  Her A1c is better than expected at 6.8, previously was high at 8.9  She has not been seen in follow-up since June  Current management, blood sugar patterns and problems identified:  She did not bring her blood sugar monitor for download today and does not remember her readings  She thinks her blood sugars were doing very well especially in the morning until about 3 weeks ago and that they are higher because of stress  Also she stopped taking her Amaryl earlier this month because she was convinced that it was causing constipation human though she has taken this for more than a year  Today her blood sugars only 101 but she had a relatively small breakfast without  much carbohydrate, had only cottage cheese, crackers?  Yogurt  She thinks her blood sugar has been as high as 260, possibly before dinner although she thinks she is checking more readings after dinner  She is asking about bruising on her abdomen where she is doing her injection on the right side but usually not on the left.  She was trying to use a 6 mm pen needle that had been given to her last time on her prescription but she prefers the smaller needle  She is probably eating smaller meals as her weight is down slightly  No hypoglycemia  reported        Side effects from medications have been: ?  Nausea from metformin  Compliance with the medical regimen: Fairly good  Hypoglycemia: None    Glucose monitoring:  done 1 times a day         Glucometer:  Accu-Chek  Blood Glucose readings  by recall  Mean values apply above for all meters except median for One Touch  PRE-MEAL Fasting Lunch Dinner Bedtime Overall  Glucose range: 150-160    ?  150-200     Mean/median:        Self-care: The diet that the patient has been following is: tries to limit sweets  She has her meals with assisted living facility     Typical meal intake: Breakfast is cereal, sometimes bacon.  At meals will get fried food at times            Dietician visit, most recent: Never CDE visit: 1/18               Exercise:  walking and calisthenics up to 30 minutes twice a week   Weight history: Previous range 98-140   Wt Readings from Last 3 Encounters:  09/21/17 113 lb 9.6 oz (51.5 kg)  08/12/17 116 lb 9.6 oz (52.9 kg)  08/12/17 116 lb 9.6 oz (52.9 kg)    Glycemic  control:  A1c in October 2017 = 8.5    Lab Results  Component Value Date   HGBA1C 6.8 09/21/2017   HGBA1C 8.9 (H) 03/30/2017   HGBA1C 9.2 (H) 02/02/2017   Lab Results  Component Value Date   MICROALBUR <0.7 11/14/2016   LDLCALC 90 02/02/2017   CREATININE 1.24 (H) 05/25/2017   Lab Results  Component Value Date   MICRALBCREAT 1.8 11/14/2016       Allergies as of 09/21/2017   No Known Allergies     Medication List       Accurate as of 09/21/17  8:26 PM. Always use your most recent med list.          acetaminophen 325 MG tablet Commonly known as:  TYLENOL Take 2 tablets (650 mg total) by mouth every 6 (six) hours as needed for moderate pain or headache.   aspirin EC 81 MG tablet Take 81 mg by mouth daily.   B-D SINGLE USE SWABS REGULAR Pads Use to clean finger before checking blood sugars and clean skin before insulin injections.   BEANO PO Take by mouth  as needed.   calcium carbonate 500 MG chewable tablet Commonly known as:  TUMS - dosed in mg elemental calcium Chew 1 tablet by mouth daily.   esomeprazole 20 MG capsule Commonly known as:  NEXIUM Take 20 mg by mouth daily at 12 noon.   feeding supplement (GLUCERNA SHAKE) Liqd Take 237 mLs by mouth 3 (three) times daily between meals.   fluticasone 50 MCG/ACT nasal spray Commonly known as:  FLONASE Place 2 sprays into both nostrils daily.   glimepiride 2 MG tablet Commonly known as:  AMARYL Take 1 tablet (2 mg total) by mouth 2 (two) times daily.   glucose blood test strip Commonly known as:  ACCU-CHEK GUIDE USE TO TEST BLOOD SUGAR 1 TIME DAILY- DX CODE E11.8   insulin aspart 100 UNIT/ML FlexPen Commonly known as:  NOVOLOG FLEXPEN Inject 3 units before breakfast and 4 units before supper   insulin degludec 100 UNIT/ML Sopn FlexTouch Pen Commonly known as:  TRESIBA Inject 6 units into the skin once daily   Insulin Pen Needle 32G X 4 MM Misc Commonly known as:  BD PEN NEEDLE NANO U/F Use to inject insulin 4 times daily   isosorbide mononitrate 30 MG 24 hr tablet Commonly known as:  IMDUR Take 1 tablet (30 mg total) by mouth daily. One tablet daily   levocetirizine 5 MG tablet Commonly known as:  XYZAL Take 5 mg by mouth every evening.   lovastatin 20 MG tablet Commonly known as:  MEVACOR Take 20 mg by mouth at bedtime.   magnesium oxide 400 MG tablet Commonly known as:  MAG-OX Take 400 mg by mouth daily as needed.   metoprolol tartrate 25 MG tablet Commonly known as:  LOPRESSOR Take 1 tablet (25 mg total) by mouth 2 (two) times daily.   multivitamin-lutein Caps capsule Take 1 capsule by mouth daily.   HAIR/SKIN/NAILS/BIOTIN PO Take 1 tablet by mouth daily.   CENTRUM SILVER 50+WOMEN Tabs Take 1 tablet by mouth daily.   nitroGLYCERIN 0.3 MG SL tablet Commonly known as:  NITROSTAT Place 0.3 mg under the tongue every 5 (five) minutes as needed for chest  pain.   polyethylene glycol packet Commonly known as:  MIRALAX / GLYCOLAX Take 17 g by mouth daily as needed.   PROBIOTIC FORMULA Caps Take 1 capsule by mouth daily.   traMADol 50 MG tablet Commonly known as:  ULTRAM Take 1 tablet one to two times per day   Turmeric Curcumin 500 MG Caps Take 1 capsule by mouth daily.   ULTRA OMEGA-3 FISH OIL PO Take 1 capsule by mouth daily.   vitamin C 1000 MG tablet Take 1,000 mg by mouth daily.       Allergies: No Known Allergies  Past Medical History:  Diagnosis Date  . Anxiety   . Arthritis   . Breast cancer ([place]), bilateral   . Chronic pancreatitis ([place])   . Daily headache   . Depression   . Dizziness   . GERD (gastroesophageal reflux disease)   . Giddiness   . History of hiatal hernia   . Hyperlipemia   . Hypertension   . IBS (irritable bowel syndrome)   . Mandibular fracture ([place]) 12/04/2015  . MI (myocardial infarction) ([place])   . Skin cancer of face   . Type II diabetes mellitus ([place])     Past Surgical History:  Procedure Laterality Date  . BREAST SURGERY    . CATARACT EXTRACTION W/ INTRAOCULAR LENS  IMPLANT, BILATERAL Bilateral   . CHOLECYSTECTOMY OPEN  1970's  . MASTECTOMY Bilateral     No family history on file.  Social History:  reports that she has never smoked. She has never used smokeless tobacco. She reports that she does not drink alcohol or use drugs.   Review of Systems   Lipid history:  She is on lovastatin With the following results    Lab Results  Component Value Date   CHOL 163 02/02/2017   HDL 48.70 02/02/2017   LDLCALC 90 02/02/2017   TRIG 120.0 02/02/2017   CHOLHDL 3 02/02/2017           Hypertension: Mild and blood pressure is normal  She is on metoprolol 25 mg, Has history of CAD  HYPERKALEMIA: She was told to restrict her high potassium foods earlier this year  Now followed by PCP, Needs follow-up   Lab Results  Component Value Date   CREATININE 1.24 (H) 05/25/2017    BUN 33 (H) 05/25/2017   NA 138 05/25/2017   K 4.9 05/25/2017   CL 104 05/25/2017   CO2 27 05/25/2017    Most recent eye exam was 07/2016, reportedly no retinopathy  Most recent foot exam: 11/17   She had constipation which she thinks is better with stopping Amaryl  She is having some dizziness and is going to see her PCP tomorrow   Physical Examination:  BP 130/76   Pulse 64   Ht 4\' 11"  (1.499 m)   Wt 113 lb 9.6 oz (51.5 kg)   SpO2 96%   BMI 22.94 kg/m        ASSESSMENT:  Diabetes type 2, uncontrolled, nonobese  See history of present illness for detailed discussion of current diabetes management, blood sugar patterns and problems identified  Her A1c appears to be better at 6.8 today This despite her blood sugars being previously being consistently high especially after meals Since he did not bring her monitor do not know what her blood sugar patterns are Today blood sugar is relatively lower in the office probably from eating a smaller meal carbohydrate   Patient reports higher blood sugars and not clear if this is related to her stopping Amaryl which she thinks was causing constipation. She also check sugars maybe higher at night from eating fried food at times and she has limited choices at the nursing home  History of hyperkalemia:   needs  follow-up, possibly related to lisinopril in the past  PLAN:   She will need to start checking her blood sugars after meals consistently She needs to bring her monitor for download on each visit She will reduce her NovoLog to 3 units in the morning Discussed that she can adjust her insulin if needed if she is eating smaller meal in the evening also For now since she reports higher fasting readings she can go up to 10 units under [name] She will try to use her left abdomen more often and also recommended using her outer thigh or buttock 4 injection sites She will use a 4 mm pen needle Discussed with her that she has no other  substitutes for Amaryl since most brand-name medications will be too expensive and we will focus on her insulin doses Explained A1c results  Follow-up with PCP for other problems and repeat electrolytes and updated preventive care   Patient Instructions  Tresiba 10 units   Take 3 Novolog in and 4 at dinner  Check blood sugars on waking up  3/7 days  Also check blood sugars about 2 hours after a meal and do this after different meals by rotation  Recommended blood sugar levels on waking up is 90-130 and about 2 hours after meal is 130-180  Please bring your blood sugar monitor to each visit, thank you    Counseling time on subjects discussed in assessment and plan sections is over 50% of today's 25 minute visit    Kortlynn Poust 09/21/2017, 8:26 PM   Note: This office note was prepared with Dragon voice recognition system technology. Any transcriptional errors that result from this process are unintentional.

## 2017-09-22 ENCOUNTER — Ambulatory Visit (INDEPENDENT_AMBULATORY_CARE_PROVIDER_SITE_OTHER): Payer: Medicare HMO | Admitting: Family Medicine

## 2017-09-22 ENCOUNTER — Encounter: Payer: Self-pay | Admitting: Family Medicine

## 2017-09-22 ENCOUNTER — Ambulatory Visit: Payer: Medicare HMO | Admitting: Family Medicine

## 2017-09-22 VITALS — BP 126/76 | HR 74 | Temp 98.4°F | Ht 59.0 in | Wt 113.6 lb

## 2017-09-22 DIAGNOSIS — M5431 Sciatica, right side: Secondary | ICD-10-CM | POA: Diagnosis not present

## 2017-09-22 DIAGNOSIS — E119 Type 2 diabetes mellitus without complications: Secondary | ICD-10-CM | POA: Diagnosis not present

## 2017-09-22 DIAGNOSIS — R262 Difficulty in walking, not elsewhere classified: Secondary | ICD-10-CM

## 2017-09-22 DIAGNOSIS — R42 Dizziness and giddiness: Secondary | ICD-10-CM

## 2017-09-22 DIAGNOSIS — G4700 Insomnia, unspecified: Secondary | ICD-10-CM | POA: Diagnosis not present

## 2017-09-22 DIAGNOSIS — K5900 Constipation, unspecified: Secondary | ICD-10-CM

## 2017-09-22 DIAGNOSIS — F341 Dysthymic disorder: Secondary | ICD-10-CM

## 2017-09-22 MED ORDER — GABAPENTIN 100 MG PO CAPS: 100.0000 mg | ORAL_CAPSULE | Freq: Every day | ORAL | 3 refills | Status: DC

## 2017-09-22 NOTE — Progress Notes (Signed)
Vanessa Gibson is a 83 y.o. female is here for follow up.  History of Present Illness:   Water quality scientist, CMA, acting as scribe for Dr. Juleen China.  HPI:   1. Dysthymia.   Depression symptoms: depressed mood. Current psychosocial stressors include: unhappiness living in [place] - wants to go back to [place].   Treatment to date has included Remeron, but she did not tolerate it.  Alcohol use: no.  Drug use: no.  Exercise: no.   Patient denies current suicidal and homicidal ideation.   2. Insomnia, unspecified type   3. Type 2 diabetes mellitus without complication, without long-term current use of insulin ([place]).   Current symptoms: no polyuria or polydipsia, no chest pain, dyspnea or TIA's, has dysesthesias in the feet.   Lab Results  Component Value Date   HGBA1C 6.8 09/21/2017    Lab Results  Component Value Date   MICROALBUR <0.7 11/14/2016    Lab Results  Component Value Date   CHOL 163 02/02/2017   HDL 48.70 02/02/2017   LDLCALC 90 02/02/2017   TRIG 120.0 02/02/2017   CHOLHDL 3 02/02/2017     Wt Readings from Last 3 Encounters:  09/22/17 113 lb 9.6 oz (51.5 kg)  09/21/17 113 lb 9.6 oz (51.5 kg)  08/12/17 116 lb 9.6 oz (52.9 kg)   BP Readings from Last 3 Encounters:  09/22/17 126/76  09/21/17 130/76  08/12/17 126/74   Lab Results  Component Value Date   CREATININE 1.24 (H) 05/25/2017     4. Right sided sciatica   5. Vertigo. Intermittent. Hx of the same. No falls.    6. Difficulty walking. Interested in PT at this point.    7. Constipation. Ongoing. No abdominal pain.   Health Maintenance Due  Topic Date Due  . PNA vac Low Risk Adult (1 of 2 - PCV13) 10/23/1995  . INFLUENZA VACCINE  06/24/2017  . OPHTHALMOLOGY EXAM  08/18/2017   Depression screen [facility] 2/9 08/12/2017 05/18/2017 05/15/2017  Decreased Interest 2 2 3   Down, Depressed, Hopeless 2 2 3   PHQ - 2 Score 4 4 6   Altered sleeping 3 3 3   Tired, decreased energy 3 3 3   Change in appetite 1 2 2   Feeling bad or  failure about yourself  2 2 3   Trouble concentrating 3 3 3   Moving slowly or fidgety/restless - 1 1  Suicidal thoughts 0 0 0  PHQ-9 Score 16 18 21   Difficult doing work/chores Very difficult Very difficult Very difficult   PMHx, SurgHx, SocialHx, FamHx, Medications, and Allergies were reviewed in the Visit Navigator and updated as appropriate.   Patient Active Problem List   Diagnosis Date Noted  . Dysthymia 06/06/2017  . Vision changes 06/06/2017  . Insomnia 06/06/2017  . CKD (chronic kidney disease) stage 3, GFR 30-59 ml/min (HCC) 05/26/2017  . Osteoporosis, post-menopausal 02/06/2016  . Bilateral hearing loss 02/06/2016  . Arthritis 02/06/2016  . Anemia of chronic disease 12/05/2015  . Protein-calorie malnutrition, severe 11/13/2015  . Chronic pancreatitis ([place]) 11/13/2015  . Diabetes mellitus, type II ([place]) 11/13/2015  . Esophageal reflux 11/13/2015  . Benign essential hypertension 11/13/2015  . Hyperlipidemia 11/13/2015   Social History  Substance Use Topics  . Smoking status: Never Smoker  . Smokeless tobacco: Never Used  . Alcohol use No   Current Medications and Allergies:   Current Outpatient Prescriptions:  .  acetaminophen (TYLENOL) 325 MG tablet, Take 2 tablets (650 mg total) by mouth every 6 (six) hours as needed  for moderate pain or headache., Disp: , Rfl:  .  Alcohol Swabs (B-D SINGLE USE SWABS REGULAR) PADS, Use to clean finger before checking blood sugars and clean skin before insulin injections., Disp: 200 each, Rfl: 2 .  Alpha-D-Galactosidase (BEANO PO), Take by mouth as needed., Disp: , Rfl:  .  Ascorbic Acid (VITAMIN C) 1000 MG tablet, Take 1,000 mg by mouth daily., Disp: , Rfl:  .  aspirin EC 81 MG tablet, Take 81 mg by mouth daily., Disp: , Rfl:  .  calcium carbonate (TUMS - DOSED IN MG ELEMENTAL CALCIUM) 500 MG chewable tablet, Chew 1 tablet by mouth daily., Disp: , Rfl:  .  esomeprazole (NEXIUM) 20 MG capsule, Take 20 mg by mouth daily at 12 noon.,  Disp: , Rfl:  .  feeding supplement, GLUCERNA SHAKE, (GLUCERNA SHAKE) LIQD, Take 237 mLs by mouth 3 (three) times daily between meals., Disp: , Rfl: 0 .  fluticasone (FLONASE) 50 MCG/ACT nasal spray, Place 2 sprays into both nostrils daily., Disp: 16 g, Rfl: 0 .  glucose blood (ACCU-CHEK GUIDE) test strip, USE TO TEST BLOOD SUGAR 1 TIME DAILY- DX CODE E11.8, Disp: 100 each, Rfl: 5 .  insulin aspart (NOVOLOG FLEXPEN) 100 UNIT/ML FlexPen, Inject 3 units before breakfast and 4 units before supper, Disp: 5 pen, Rfl: 4 .  insulin degludec (TRESIBA) 100 UNIT/ML SOPN FlexTouch Pen, Inject 6 units into the skin once daily (Patient taking differently: 9 Units. Inject 8 units into the skin once daily), Disp: 18 pen, Rfl: 1 .  Insulin Pen Needle (BD PEN NEEDLE NANO U/F) 32G X 4 MM MISC, Use to inject insulin 4 times daily, Disp: 100 each, Rfl: 2 .  isosorbide mononitrate (IMDUR) 30 MG 24 hr tablet, Take 1 tablet (30 mg total) by mouth daily. One tablet daily, Disp: 30 tablet, Rfl: 0 .  levocetirizine (XYZAL) 5 MG tablet, Take 5 mg by mouth every evening., Disp: , Rfl:  .  lovastatin (MEVACOR) 20 MG tablet, Take 20 mg by mouth at bedtime., Disp: , Rfl:  .  magnesium oxide (MAG-OX) 400 MG tablet, Take 400 mg by mouth daily as needed., Disp: , Rfl:  .  metoprolol tartrate (LOPRESSOR) 25 MG tablet, Take 1 tablet (25 mg total) by mouth 2 (two) times daily., Disp: 180 tablet, Rfl: 2 .  Multiple Vitamins-Minerals (CENTRUM SILVER 50+WOMEN) TABS, Take 1 tablet by mouth daily., Disp: , Rfl:  .  Multiple Vitamins-Minerals (HAIR/SKIN/NAILS/BIOTIN PO), Take 1 tablet by mouth daily., Disp: , Rfl:  .  multivitamin-lutein (OCUVITE-LUTEIN) CAPS capsule, Take 1 capsule by mouth daily., Disp: , Rfl:  .  nitroGLYCERIN (NITROSTAT) 0.3 MG SL tablet, Place 0.3 mg under the tongue every 5 (five) minutes as needed for chest pain., Disp: , Rfl:  .  Omega-3 Fatty Acids (ULTRA OMEGA-3 FISH OIL PO), Take 1 capsule by mouth daily., Disp: ,  Rfl:  .  polyethylene glycol (MIRALAX / GLYCOLAX) packet, Take 17 g by mouth daily as needed., Disp: , Rfl:  .  Probiotic Product (PROBIOTIC FORMULA) CAPS, Take 1 capsule by mouth daily., Disp: , Rfl:  .  Turmeric Curcumin 500 MG CAPS, Take 1 capsule by mouth daily., Disp: , Rfl:  .  gabapentin (NEURONTIN) 100 MG capsule, Take 1 capsule (100 mg total) by mouth at bedtime., Disp: 30 capsule, Rfl: 3  No Known Allergies Review of Systems   Pertinent items are noted in the HPI. Otherwise, ROS is negative.  Vitals:   Vitals:   09/22/17 1300  BP: 126/76  Pulse: 74  Temp: 98.4 F (36.9 C)  TempSrc: Oral  SpO2: 98%  Weight: 113 lb 9.6 oz (51.5 kg)  Height: 4\' 11"  (1.499 m)     Body mass index is 22.94 kg/m. Physical Exam:   Physical Exam  Constitutional: She appears well-nourished.  HENT:  Head: Normocephalic and atraumatic.  Eyes: Pupils are equal, round, and reactive to light. EOM are normal.  Neck: Normal range of motion. Neck supple.  Cardiovascular: Normal rate, regular rhythm, normal heart sounds and intact distal pulses.   Pulmonary/Chest: Effort normal.  Abdominal: Soft.  Skin: Skin is warm.  Psychiatric: She has a normal mood and affect. Her behavior is normal.  Nursing note and vitals reviewed.  Results for orders placed or performed in visit on 09/21/17  POCT glucose (manual entry)  Result Value Ref Range   POC Glucose 101 (A) 70 - 99 mg/dl  POCT glycosylated hemoglobin (Hb A1C)  Result Value Ref Range   Hemoglobin A1C 6.8     Assessment and Plan:   Adilyn was seen today for follow-up.  Diagnoses and all orders for this visit:  Dysthymia Comments: Reviewed healthy coping practices. I encouraged her to talk to her family re: her unhappiness with living in [place].  Insomnia, unspecified type Comments: Stable.   Type 2 diabetes mellitus without complication, without long-term current use of insulin (HCC) Comments: Followed by Endocrinology.   Right sided  sciatica Comments: Sees chiropractor. Usually wears back brace. Asks for PT and walker with seat. No recent falls. No Hx of neurontin use per patient.   Vertigo Comments: Intermittent, chronic. No falls. Improved with meclizine.  Orders: -     Ambulatory referral to Physical Therapy  Difficulty walking -     For home use only DME 4 wheeled rolling walker with seat ([id number])  Constipation, unspecified constipation type Comments: Improved today. Using Miralax prn. Okay to add Metamucil prn.    . Reviewed expectations re: course of current medical issues. . Discussed self-management of symptoms. . Outlined signs and symptoms indicating need for more acute intervention. . Patient verbalized understanding and all questions were answered. Marland Kitchen Health Maintenance issues including appropriate healthy diet, exercise, and smoking avoidance were discussed with patient. . See orders for this visit as documented in the electronic medical record. . Patient received an After Visit Summary.   Briscoe Deutscher, DO [place] [place] 09/26/2017  Future Appointments Date Time Provider [place]  10/26/2017 2:15 PM Elayne Snare, MD [facility] None

## 2017-09-23 ENCOUNTER — Other Ambulatory Visit: Payer: Self-pay

## 2017-09-23 ENCOUNTER — Telehealth: Payer: Self-pay | Admitting: Family Medicine

## 2017-09-23 ENCOUNTER — Other Ambulatory Visit: Payer: Self-pay | Admitting: Family Medicine

## 2017-09-23 MED ORDER — GABAPENTIN 100 MG PO CAPS: 100.0000 mg | ORAL_CAPSULE | Freq: Every day | ORAL | 3 refills | Status: DC

## 2017-09-23 NOTE — Telephone Encounter (Signed)
MEDICATION: gabapentin (NEURONTIN) 100 MG capsule  PHARMACY:   [facility] [place] [phone] (Phone) [phone] (Fax)   IS THIS A 90 DAY SUPPLY : no  IS PATIENT OUT OF MEDICATION: yes  IF NOT; HOW MUCH IS LEFT: n/a  LAST APPOINTMENT DATE: @10 /30/2018  NEXT APPOINTMENT DATE:@Visit  date not found  OTHER COMMENTS:    **Let patient know to contact pharmacy at the end of the day to make sure medication is ready. **  ** Please notify patient to allow 48-72 hours to process**  **Encourage patient to contact the pharmacy for refills or they can request refills through [facility]**

## 2017-09-23 NOTE — Telephone Encounter (Signed)
Gabapentin has been re-sent to [facility].

## 2017-09-23 NOTE — Telephone Encounter (Signed)
Patient called in reference to prescriptions sent yesterday after appointment. Patient stated prescriptions should be sent to [facility] not [facility]. Please call patient and advise when sent to [facility]. OK to leave message.

## 2017-09-23 NOTE — Telephone Encounter (Signed)
Dr Dwyane Dee has sent this prescription in two days ago.I have notified patient.

## 2017-09-23 NOTE — Telephone Encounter (Signed)
MEDICATION: insulin aspart (NOVOLOG FLEXPEN) 100 UNIT/ML FlexPen  PHARMACY:   [place], [place] [phone] (Phone) [phone] (Fax)   IS THIS A 90 DAY SUPPLY : yes  IS PATIENT OUT OF MEDICATION: yes  IF NOT; HOW MUCH IS LEFT:   LAST APPOINTMENT DATE: @10 /30/2018  NEXT APPOINTMENT DATE:@Visit  date not found  OTHER COMMENTS:    **Let patient know to contact pharmacy at the end of the day to make sure medication is ready. **  ** Please notify patient to allow 48-72 hours to process**  **Encourage patient to contact the pharmacy for refills or they can request refills through [facility]**

## 2017-09-24 NOTE — Telephone Encounter (Signed)
This medication was sent to the patient's pharmacy on 09/23/2017.

## 2017-09-29 ENCOUNTER — Telehealth: Payer: Self-pay | Admitting: Family Medicine

## 2017-09-29 DIAGNOSIS — R42 Dizziness and giddiness: Secondary | ICD-10-CM

## 2017-09-29 NOTE — Telephone Encounter (Signed)
Yes, please.

## 2017-09-29 NOTE — Telephone Encounter (Signed)
Do you want to set the patient up for home physical therapy?

## 2017-09-29 NOTE — Telephone Encounter (Signed)
I called to schedule physical therapy for patient at our office. Patient states that she is unable to come in and do this in our office and would like to get home health physical therapy set up.

## 2017-09-30 NOTE — Telephone Encounter (Signed)
I have sent a message to [facility] to see if this is something they can do.

## 2017-10-01 ENCOUNTER — Other Ambulatory Visit: Payer: Self-pay | Admitting: Pharmacist

## 2017-10-01 ENCOUNTER — Telehealth: Payer: Self-pay | Admitting: Endocrinology

## 2017-10-01 ENCOUNTER — Telehealth: Payer: Self-pay | Admitting: Family Medicine

## 2017-10-01 NOTE — Telephone Encounter (Signed)
Spoke to pt, she states she has been taking gabapentin x 7 days with no relief. She is asking for a referral to a podiatrist. I explained that Dr Juleen China is out of the office until Monday but we will give her a call after she returns. Pt stated understanding.

## 2017-10-01 NOTE — Patient Outreach (Signed)
[facility]) Care Management  10/01/2017  Vanessa Gibson Sep 25, 1928 [id number]   [place] back on the patient's behalf because another [facility] Pharmacist shared they were able to get a patient Novolog Flex Pens instead of Novolog vials. Ms. Innes said she was not able to use the syringes.  The representative I spoke with today said they would fax a hardship form to the provider's office.  I called the provider's office and asked them to be on the look out for the form and to complete it and send it back to [employer] on the patient's behalf.  Patient's son-in-law was called to update him on the status of things.  Plan: Follow up on 3-5 business days.  [name], PharmD, [place] Clinical Pharmacist [phone]

## 2017-10-01 NOTE — Telephone Encounter (Signed)
Patient calling to inquire about medication she is taking- gabapentin (NEURONTIN) 100 MG capsule [[id number]]   Patient states she is unsure as to why she is on it, or what medication is for. Please call and advise patient. States if she does not answer please leave a detailed message.

## 2017-10-01 NOTE — Telephone Encounter (Signed)
I have faxed a form to the patients daughter [name] to fill out and fax back for [employer] to get [employer], but she has not faxed it back yet- do you want patient to use tresiba or humalog please advise

## 2017-10-01 NOTE — Telephone Encounter (Signed)
Patient calling to inquire about the referral for PT, I informed patient we sent it through home health and they should be reaching out to her.   Patient also stated she was in a lot of pain in her leg/foot, and is worried it is related to her diabetes, and is asking for a referral to see a podiatrist.

## 2017-10-01 NOTE — Telephone Encounter (Signed)
[employer] is faxing a form over for patient to receive the treseba flex pens this is with the patient assistance program where she would get it free. She stated she could not use the novalog vial any questions call [phone]

## 2017-10-01 NOTE — Telephone Encounter (Signed)
She needs both.  However if she can get NovoLog instead of Humalog from [employer] that would be simpler

## 2017-10-01 NOTE — Telephone Encounter (Signed)
[facility] can not send PT to house. Will place order for [place].

## 2017-10-05 NOTE — Telephone Encounter (Signed)
Vanessa Gibson is working on referral to podiatry.

## 2017-10-05 NOTE — Telephone Encounter (Signed)
We have placed a referral for podiatry in July. I have given information to [place].

## 2017-10-06 ENCOUNTER — Other Ambulatory Visit: Payer: Self-pay | Admitting: Pharmacist

## 2017-10-06 NOTE — Patient Outreach (Signed)
[facility]) Care Management  10/06/2017  Makenleigh Crownover 11-Aug-1928 [id number]   Called the provider's office about the form requested last week and had to leave a message. [place] back to make sure the form was sent. The representative I spoke with today,  (Roxanna) said they did not have a record of me calling last week. (Although I did and waited on hold >20 minutes). Nonetheless, Roxana said she would fax the "medical necessity form" to Dr. Ronnie Derby office today.  Spoke with patient's son-in-law to let him know the status of things.  HIPAA identifiers were obtained.  Plan:  Follow up in 1-2 business days. Patient is already approved with Nov[employer] until 11/23/2017.

## 2017-10-06 NOTE — Telephone Encounter (Signed)
Please call Alwyn Ren from [place] at ph# [phone] re: patient assistance forms, etc.

## 2017-10-07 ENCOUNTER — Other Ambulatory Visit: Payer: Self-pay | Admitting: Pharmacist

## 2017-10-07 NOTE — Telephone Encounter (Signed)
I went through all of the faxes and could not find this form I called and left a vm with Alwyn Ren requesting she fax it back to me at [phone]

## 2017-10-07 NOTE — Telephone Encounter (Signed)
Hey, I remember you started these for Ms Pat, can you contact Alwyn Ren? Please see previous message.  Thank you!

## 2017-10-07 NOTE — Telephone Encounter (Signed)
Thank you for checking the faxes. I called [employer] back today to request the form be resent. The [employer] representative said they were sending the form again today.  I also verified the fax number. They will not send the form to me directly.  The form is a "medical necessity" form to request Novolog FlexPens because the patient cannot use Novolog Vials.  [employer] can be reached at [place], PharmD, [place] Clinical Pharmacist [phone]

## 2017-10-08 NOTE — Patient Outreach (Addendum)
[facility]) Care Management  10/08/2017  Hailei Craze 27-Oct-1928 [id number]   Linus Galas from Dr. Ronnie Derby office called.  The medical necessity form was explained. She said they had not received it. [employer] was called back and the fax number was verified. The representative at [place] said the form had been sent on 10/06/17 but also said she would resend on 10/07/17.    Plan:  Follow up to be sure Dr. Dwyane Dee received the form.   [name], PharmD, [place] Clinical Pharmacist [phone]

## 2017-10-09 ENCOUNTER — Other Ambulatory Visit: Payer: Self-pay | Admitting: Pharmacist

## 2017-10-09 ENCOUNTER — Telehealth: Payer: Self-pay | Admitting: Family Medicine

## 2017-10-09 NOTE — Patient Outreach (Signed)
[facility]) Care Management  10/09/2017  Richie Vadala [birth year] [id number]   Called Dr. Ronnie Derby office on the patient's behalf to inquire about the form that was supposed to have been faxed to their office from [employer].  The form was a "medical exception form" so the patient could receive Novolog Flex Pen instead of the vials.     Lattie Haw said they did not receive a form.  Since the form continues to be an issue, it was asked that the Endocrinology office send a letter requesting an appeal on the patient's behalf.  The actual copy for the letter was sent via inbasket messaging:  [employer] Patient Assistance Program:   Re: Vanessa Gibson DOB [birth year]   This letter is being written to request Novolog Flex Pens for Vanessa Gibson. Vanessa Gibson is already approved through [employer] Patient Assistance Program through November 23, 2017. She received a three month supply of Tresiba Flex Touch Pens and Novolog vials. Prior to being approved for the program, Vanessa Gibson used [employer]. She tried to use the Novolog Vials sent to her through your program but was unable to due to hand dexterity issues and low visual acuity.    Being able to have insulin provided at no cost has helped Vanessa Gibson's blood glucose control immensely and having meal time insulin is vital to her continued success. Please reconsider Vanessa Gibson's application and allow her receive Novolog Flex Pens from your program. (Novolog Flex Pen was written on her original application.)     Thank you so much for your time and consideration. Your program's continued generosity is greatly appreciated.      Plan: Call [employer] back next week to check on the status Call patient's Son-in-Law (caregiver)   Vanessa Gibson, PharmD, [place] Clinical Pharmacist [phone]

## 2017-10-09 NOTE — Telephone Encounter (Signed)
Patient called and stated that [facility] called but did not set up a time to come out. I advised that they will set up the time. We can not do anything about there scheduling.

## 2017-10-09 NOTE — Telephone Encounter (Signed)
Patient never got a call back RE her ability to travel to PT and her request for a chair. She wants PT done at home. Please advise.

## 2017-10-09 NOTE — Telephone Encounter (Signed)
Sending to you

## 2017-10-09 NOTE — Telephone Encounter (Signed)
Left message on patients phone that Vanessa Gibson had told her about the referral on 10/01/17 for PT at Home. Home Health will have to contact her to get this scheduled.

## 2017-10-12 DIAGNOSIS — M1990 Unspecified osteoarthritis, unspecified site: Secondary | ICD-10-CM | POA: Diagnosis not present

## 2017-10-12 DIAGNOSIS — M5431 Sciatica, right side: Secondary | ICD-10-CM | POA: Diagnosis not present

## 2017-10-12 DIAGNOSIS — N183 Chronic kidney disease, stage 3 (moderate): Secondary | ICD-10-CM | POA: Diagnosis not present

## 2017-10-12 DIAGNOSIS — R262 Difficulty in walking, not elsewhere classified: Secondary | ICD-10-CM | POA: Diagnosis not present

## 2017-10-12 DIAGNOSIS — D631 Anemia in chronic kidney disease: Secondary | ICD-10-CM | POA: Diagnosis not present

## 2017-10-12 DIAGNOSIS — I129 Hypertensive chronic kidney disease with stage 1 through stage 4 chronic kidney disease, or unspecified chronic kidney disease: Secondary | ICD-10-CM | POA: Diagnosis not present

## 2017-10-12 DIAGNOSIS — E1122 Type 2 diabetes mellitus with diabetic chronic kidney disease: Secondary | ICD-10-CM | POA: Diagnosis not present

## 2017-10-12 DIAGNOSIS — K861 Other chronic pancreatitis: Secondary | ICD-10-CM | POA: Diagnosis not present

## 2017-10-12 DIAGNOSIS — R42 Dizziness and giddiness: Secondary | ICD-10-CM | POA: Diagnosis not present

## 2017-10-13 NOTE — Telephone Encounter (Signed)
Faxed to [employer] today-the list of medications requested

## 2017-10-14 ENCOUNTER — Ambulatory Visit: Payer: Self-pay | Admitting: Pharmacist

## 2017-10-16 ENCOUNTER — Ambulatory Visit: Payer: Self-pay | Admitting: Pharmacist

## 2017-10-19 ENCOUNTER — Other Ambulatory Visit: Payer: Self-pay | Admitting: Endocrinology

## 2017-10-19 ENCOUNTER — Other Ambulatory Visit: Payer: Self-pay

## 2017-10-19 MED ORDER — GLUCOSE BLOOD VI STRP
ORAL_STRIP | 5 refills | Status: DC
Start: 2017-10-19 — End: 2017-10-22

## 2017-10-20 ENCOUNTER — Other Ambulatory Visit: Payer: Self-pay | Admitting: Pharmacist

## 2017-10-20 NOTE — Patient Outreach (Signed)
[facility]) Care Management  10/20/2017  Marci Polito 11-26-1927 [id number]  [employer] on the patient's behalf.  The representative said they received the certificate of medical necessity form but the instructions on the prescription on the from and what was on the original application did not match. Page 3 of the application would need to be resubmitted to match the form or the form could be corrected to match the application.  Linus Galas, CMA at Dr. Ronnie Derby office was called. Lattie Haw said if the form was faxed to the office she would get it and redo it.    The form was faxed to her by Freda Jackson at [facility].  Lattie Haw was asked to fax the form back to [employer].  Plan: Follow up on form by Wednesday October 22, 2017  [name], PharmD, [place] Clinical Pharmacist [phone]

## 2017-10-22 ENCOUNTER — Other Ambulatory Visit: Payer: Self-pay | Admitting: Pharmacist

## 2017-10-22 ENCOUNTER — Telehealth: Payer: Self-pay

## 2017-10-22 ENCOUNTER — Other Ambulatory Visit: Payer: Self-pay | Admitting: Endocrinology

## 2017-10-22 ENCOUNTER — Encounter: Payer: Self-pay | Admitting: Pharmacist

## 2017-10-22 DIAGNOSIS — N183 Chronic kidney disease, stage 3 (moderate): Secondary | ICD-10-CM | POA: Diagnosis not present

## 2017-10-22 DIAGNOSIS — E1122 Type 2 diabetes mellitus with diabetic chronic kidney disease: Secondary | ICD-10-CM | POA: Diagnosis not present

## 2017-10-22 DIAGNOSIS — I129 Hypertensive chronic kidney disease with stage 1 through stage 4 chronic kidney disease, or unspecified chronic kidney disease: Secondary | ICD-10-CM | POA: Diagnosis not present

## 2017-10-22 DIAGNOSIS — K861 Other chronic pancreatitis: Secondary | ICD-10-CM | POA: Diagnosis not present

## 2017-10-22 DIAGNOSIS — R42 Dizziness and giddiness: Secondary | ICD-10-CM | POA: Diagnosis not present

## 2017-10-22 DIAGNOSIS — M1990 Unspecified osteoarthritis, unspecified site: Secondary | ICD-10-CM | POA: Diagnosis not present

## 2017-10-22 DIAGNOSIS — M5431 Sciatica, right side: Secondary | ICD-10-CM | POA: Diagnosis not present

## 2017-10-22 DIAGNOSIS — D631 Anemia in chronic kidney disease: Secondary | ICD-10-CM | POA: Diagnosis not present

## 2017-10-22 DIAGNOSIS — R262 Difficulty in walking, not elsewhere classified: Secondary | ICD-10-CM | POA: Diagnosis not present

## 2017-10-22 NOTE — Telephone Encounter (Signed)
Copied from [place]. Topic: Inquiry >> Oct 22, 2017  2:17 PM Conception Chancy, NT wrote: Reason for CRM: Clair Gulling is calling from homecare requesting verbal order for nursing evaluation for diabetic education and blood sugar regulation, speech therapy for volume and potential dementia, requesting diabetic diet for the facility, requesting information about history for low back pain . Requesting current medication list  Fax [phone]  Clair Gulling [phone]

## 2017-10-22 NOTE — Patient Outreach (Signed)
[facility]) Care Management  10/22/2017  Vanessa Gibson August 02, 1928 [id number]   [employer] on the patient's behalf. Unfortunately, the representative stated they did not receive the medical necessity form that was faxed by Dr. Ronnie Derby office.  A message was sent to Linus Galas at Dr. Ronnie Derby office requesting the form be sent to the [place]. The form was faxed but it was not signed by the provider.   A message was sent back to Linus Galas to request the form be resent with signature by the provider.  Plan: Follow up on 10/23/2017  [name], PharmD, [place] Clinical Pharmacist [phone]

## 2017-10-22 NOTE — Telephone Encounter (Signed)
Verbal order given to [facility] for PT. Patient would like for [place] to call and make f/u to talk about depression medication changes. Informed we will call tomorrow and make app with patient.

## 2017-10-23 DIAGNOSIS — R2681 Unsteadiness on feet: Secondary | ICD-10-CM | POA: Diagnosis not present

## 2017-10-23 DIAGNOSIS — R531 Weakness: Secondary | ICD-10-CM | POA: Diagnosis not present

## 2017-10-23 NOTE — Telephone Encounter (Signed)
Left message to return call to our office.  CRM started patent needs follow up

## 2017-10-26 ENCOUNTER — Ambulatory Visit: Payer: Medicare HMO | Admitting: Endocrinology

## 2017-10-27 ENCOUNTER — Telehealth: Payer: Self-pay | Admitting: Pharmacist

## 2017-10-27 NOTE — Telephone Encounter (Signed)
-----   Message from Nile Riggs, [place] sent at 10/26/2017  8:02 AM EST ----- Regarding: RE: Thank you Hi Alwyn Ren!!  I sent it again signed this time so I am hoping this is all that needs to be done. I really don't want to give up on this lady but I do not know what else we can do. I did not fax the signed copy to you I faxed it directly to [place] so if you would, please let me know if this was enough. I did it the day you emailed me about it not being signed sorry that I did not get back with you to let you know.   Thank you for all your help with this patient you are the best!!!  Linus Galas  ----- Message ----- From: [name], [facility] Sent: 10/23/2017   2:46 PM To: Nile Riggs, CMA Subject: Thank you                                      Thank you so much for all of your help with Mrs. Dant. I think we have done all we can. I don't think she will be able to get her Novolog pens for free. The application needed to be signed by a provider. Every time I call them, there is another hoop to jump through. I'm throwing in the towel. We gave it the college try!  Blessings and have a great weekend!  [name], PharmD, [place] Clinical Pharmacist [phone]

## 2017-10-27 NOTE — Patient Outreach (Signed)
[facility]) Care Management  10/27/2017  Vanessa Gibson Oct 29, 1928 [id number]    [employer] was called on the patient's behalf. I spoke with a representative named, Louretha.  Louretha confirmed the patient was approved and would receive Novolog Flex pens delivered to Dr. Ronnie Derby office in 7-10 business days.  Patient's son-in-law was called to inform of the status. HIPAA identifiers were obtained.    [name], PharmD, BCACP [facility] Clinical Pharmacist [phone]  I will follow up to be sure she picks up her insulin from the provider's office.

## 2017-10-27 NOTE — Telephone Encounter (Signed)
Called patient someone had called and made appointment for her.

## 2017-10-28 ENCOUNTER — Telehealth: Payer: Self-pay | Admitting: Family Medicine

## 2017-10-28 ENCOUNTER — Telehealth: Payer: Self-pay

## 2017-10-28 DIAGNOSIS — N183 Chronic kidney disease, stage 3 (moderate): Secondary | ICD-10-CM | POA: Diagnosis not present

## 2017-10-28 DIAGNOSIS — E1122 Type 2 diabetes mellitus with diabetic chronic kidney disease: Secondary | ICD-10-CM | POA: Diagnosis not present

## 2017-10-28 DIAGNOSIS — M5431 Sciatica, right side: Secondary | ICD-10-CM | POA: Diagnosis not present

## 2017-10-28 DIAGNOSIS — K861 Other chronic pancreatitis: Secondary | ICD-10-CM | POA: Diagnosis not present

## 2017-10-28 DIAGNOSIS — D631 Anemia in chronic kidney disease: Secondary | ICD-10-CM | POA: Diagnosis not present

## 2017-10-28 DIAGNOSIS — R42 Dizziness and giddiness: Secondary | ICD-10-CM | POA: Diagnosis not present

## 2017-10-28 DIAGNOSIS — I129 Hypertensive chronic kidney disease with stage 1 through stage 4 chronic kidney disease, or unspecified chronic kidney disease: Secondary | ICD-10-CM | POA: Diagnosis not present

## 2017-10-28 DIAGNOSIS — R262 Difficulty in walking, not elsewhere classified: Secondary | ICD-10-CM | POA: Diagnosis not present

## 2017-10-28 DIAGNOSIS — M1990 Unspecified osteoarthritis, unspecified site: Secondary | ICD-10-CM | POA: Diagnosis not present

## 2017-10-28 NOTE — Telephone Encounter (Signed)
Copied from [place] [id number]. Topic: Inquiry >> Oct 28, 2017  3:58 PM Malena Catholic I, NT wrote: Reason for CRM: mr [facility] call for community Resources for transportation,Depressions screening please call Lawrence Santiago   [phone]

## 2017-10-29 ENCOUNTER — Telehealth: Payer: Self-pay

## 2017-10-29 ENCOUNTER — Telehealth: Payer: Self-pay | Admitting: Endocrinology

## 2017-10-29 NOTE — Telephone Encounter (Signed)
I am speaking with [facility] and she has lost her meter. She is asking if it is okay to come tomorrow to her appointment and get a new meter? She recently moved and they are not able to find the case that her meter was in.  Please advise.

## 2017-10-29 NOTE — Telephone Encounter (Signed)
Tried to call Mr. Vanessa Gibson back but the phone number says that the prescribed is not active.

## 2017-10-29 NOTE — Telephone Encounter (Signed)
Please give her a new meter but I need to see her about 2 weeks later so we have some blood sugars, alternating waking up blood sugars and some 2-3 hours after various meals

## 2017-10-29 NOTE — Telephone Encounter (Signed)
Pt lost glucometer when moving into new home. Do we have one to use?

## 2017-10-29 NOTE — Telephone Encounter (Signed)
Called [place] and I spoke to patient and she is going to check to see if she can get someone to bring her here to pick up an [place] meter or if we need to send one in to [facility] for her.

## 2017-10-30 ENCOUNTER — Ambulatory Visit: Payer: Medicare HMO | Admitting: Endocrinology

## 2017-11-03 ENCOUNTER — Ambulatory Visit: Payer: Self-pay | Admitting: *Deleted

## 2017-11-03 NOTE — Telephone Encounter (Signed)
Pt  States   She  Has  Felt  Dizzy  Almost   Passes  Out last pm  While  Playing  Bingo.  Glucose  Was  201  This  Am .   Pt    Had  An  Appointment  tommorow  But cancelled  It  Because  Of  Weather.  Pt  Does  Not  Wish  To  Go to  [facility] made  For  Pt   With  Dr  Juleen China   For the  dizzyness .  Pt  Advised  To  Rest    And  Call  911  If  Symptoms  Become   Worse  . Reason for Disposition . [1] MODERATE dizziness (e.g., interferes with normal activities) AND [2] has been evaluated by physician for this  Answer Assessment - Initial Assessment Questions 1. DESCRIPTION: "Describe your dizziness."      Eyes  Not  Focusing     2. LIGHTHEADED: "Do you feel lightheaded?" (e.g., somewhat faint, woozy, weak upon standing)      Yes  3. VERTIGO: "Do you feel like either you or the room is spinning or tilting?" (i.e. vertigo)      Vertigo   Feels  Like   Spinning    4. SEVERITY: "How bad is it?"  "Do you feel like you are going to faint?" "Can you stand and walk?"   - MILD - walking normally   - MODERATE - interferes with normal activities (e.g., work, school)    - SEVERE - unable to stand, requires support to walk, feels like passing out now.      Moderate   Worse  When  She  Moves   5. ONSET:  "When did the dizziness begin?"      Slowly  Getting    Worse      6. AGGRAVATING FACTORS: "Does anything make it worse?" (e.g., standing, change in head position)    Standing   Makes   It  Worse   7. HEART RATE: "Can you tell me your heart rate?" "How many beats in 15 seconds?"  (Note: not all patients can do this)      NO 8. CAUSE: "What do you think is causing the dizziness?"     NO  IDEA  9. RECURRENT SYMPTOM: "Have you had dizziness before?" If so, ask: "When was the last time?" "What happened that time?"     YES   WAS  ON  MECLIZINE    10. OTHER SYMPTOMS: "Do you have any other symptoms?" (e.g., fever, chest pain, vomiting, diarrhea, bleeding)     201  Today   11. PREGNANCY: "Is there  any chance you are pregnant?" "When was your last menstrual period?"      no  Protocols used: DIZZINESS [facility]

## 2017-11-04 ENCOUNTER — Ambulatory Visit: Payer: Medicare HMO | Admitting: Family Medicine

## 2017-11-05 ENCOUNTER — Telehealth: Payer: Self-pay

## 2017-11-05 NOTE — Telephone Encounter (Signed)
Copied from [place]. Topic: General - Other >> Nov 05, 2017 10:08 AM Synthia Innocent wrote: Need verbal orders for nursing eval for disease management

## 2017-11-05 NOTE — Telephone Encounter (Signed)
OKAY...

## 2017-11-05 NOTE — Telephone Encounter (Signed)
Ok to give verbal 

## 2017-11-06 ENCOUNTER — Ambulatory Visit: Payer: Self-pay

## 2017-11-06 ENCOUNTER — Telehealth: Payer: Self-pay | Admitting: Endocrinology

## 2017-11-06 ENCOUNTER — Ambulatory Visit: Payer: Medicare HMO | Admitting: Family Medicine

## 2017-11-06 ENCOUNTER — Telehealth: Payer: Self-pay | Admitting: Family Medicine

## 2017-11-06 ENCOUNTER — Encounter: Payer: Self-pay | Admitting: Family Medicine

## 2017-11-06 VITALS — BP 108/48 | HR 70 | Temp 98.1°F | Ht 59.0 in | Wt 115.0 lb

## 2017-11-06 DIAGNOSIS — E119 Type 2 diabetes mellitus without complications: Secondary | ICD-10-CM

## 2017-11-06 DIAGNOSIS — R42 Dizziness and giddiness: Secondary | ICD-10-CM | POA: Diagnosis not present

## 2017-11-06 DIAGNOSIS — F329 Major depressive disorder, single episode, unspecified: Secondary | ICD-10-CM

## 2017-11-06 DIAGNOSIS — I959 Hypotension, unspecified: Secondary | ICD-10-CM | POA: Diagnosis not present

## 2017-11-06 MED ORDER — CEFDINIR 300 MG PO CAPS: 300.0000 mg | ORAL_CAPSULE | Freq: Two times a day (BID) | ORAL | 0 refills | Status: DC

## 2017-11-06 NOTE — Patient Instructions (Addendum)
   Increase your Tyler Aas by 2 units every morning that your blood sugar is above 150. Follow up with Dr. Dwyane Dee.   Cut your Metoprolol in half for the next week. Recheck in 1 week.   We will try an antibiotic today.

## 2017-11-06 NOTE — Telephone Encounter (Signed)
Please advise and resend medication to the correct pharmacy.

## 2017-11-06 NOTE — Telephone Encounter (Signed)
Copied from [place] #[id number]. Topic: Quick Communication - See Telephone Encounter >> Nov 06, 2017  4:51 PM Bea Graff, NT wrote: CRM for notification. See Telephone encounter for: Pt states when she was there she stated she needed her rx sent to [facility] but [facility]. She needs this medicine resent. The omnicef.   11/06/17.

## 2017-11-06 NOTE — Telephone Encounter (Signed)
Pt called with concern of her blood sugar before dinner of 347. Pt given reassurance and care advice given. Advised pt to recheck blood sugar 2 hours after eating then per her usual schedule. Care advice given to call if 2 or 3 consecutive blood sugars are greater than 300. Pt stated that she has drank very little today. Encourage pt to drink 1 glass of water for the next 4 hours. Pt given reassurance . Reason for Disposition . [1] Blood glucose > 240 mg/dl (13 mmol/l) AND [2] uses insulin (e.g., insulin-dependent, all people with type 1 diabetes)  Answer Assessment - Initial Assessment Questions 1. BLOOD GLUCOSE: "What is your blood glucose level?"      347 2. ONSET: "When did you check the blood glucose?"     Before dinner 3. USUAL RANGE: "What is your glucose level usually?" (e.g., usual fasting morning value, usual evening value)     Usually evening over 200 has been over 300 before 4. KETONES: "Do you check for ketones (urine or blood test strips)?" If yes, ask: "What does the test show now?"      No  5. TYPE 1 or 2:  "Do you know what type of diabetes you have?"  (e.g., Type 1, Type 2, Gestational; doesn't know)     Type 2  6. INSULIN: "Do you take insulin?" If yes, ask: "Have you missed any shots recently?"     Yes Novolog and Tresiba 7. DIABETES PILLS: "Do you take any pills for your diabetes?" If yes, ask: "Have you missed taking any pills recently?"     no 8. OTHER SYMPTOMS: "Do you have any symptoms?" (e.g., fever, frequent urination, difficulty breathing, dizziness, weakness, vomiting)     Having dizziness and weakness  9. PREGNANCY: "Is there any chance you are pregnant?" "When was your last menstrual period?"     n/a  Protocols used: DIABETES - HIGH BLOOD SUGAR-A-AH

## 2017-11-06 NOTE — Telephone Encounter (Signed)
Left message to return call to our office.  

## 2017-11-06 NOTE — Telephone Encounter (Signed)
Patient called today because she went to Dr. Hollace Hayward and was told to increase Tresiba to 12 instead of 10 and she just took it now but she states her bs was 187 this morning and was 347 at 4 pm today should she take more novolog or should she do something else- she states she is very nervous it has never been this high before please advise

## 2017-11-06 NOTE — Telephone Encounter (Signed)
She should not take more Novolog at this time and it is not dangerous.  She needs to call us if her blood sugars are consistently high after meals.  Also bring monitor for her upcoming visit

## 2017-11-07 ENCOUNTER — Encounter: Payer: Self-pay | Admitting: Family Medicine

## 2017-11-07 NOTE — Progress Notes (Signed)
Vanessa Gibson is a 83 y.o. female is here for follow up.  History of Present Illness:   HPI: See Assessment and Plan section for Problem Based Charting of issues discussed today.  Health Maintenance Due  Topic Date Due  . OPHTHALMOLOGY EXAM  08/18/2017   Depression screen [facility] 2/9 08/12/2017 05/18/2017 05/15/2017  Decreased Interest 2 2 3   Down, Depressed, Hopeless 2 2 3   PHQ - 2 Score 4 4 6   Altered sleeping 3 3 3   Tired, decreased energy 3 3 3   Change in appetite 1 2 2   Feeling bad or failure about yourself  2 2 3   Trouble concentrating 3 3 3   Moving slowly or fidgety/restless - 1 1  Suicidal thoughts 0 0 0  PHQ-9 Score 16 18 21   Difficult doing work/chores Very difficult Very difficult Very difficult   PMHx, SurgHx, SocialHx, FamHx, Medications, and Allergies were reviewed in the Visit Navigator and updated as appropriate.   Patient Active Problem List   Diagnosis Date Noted  . Dysthymia 06/06/2017  . Vision changes 06/06/2017  . Insomnia 06/06/2017  . CKD (chronic kidney disease) stage 3, GFR 30-59 ml/min (HCC) 05/26/2017  . Osteoporosis, post-menopausal 02/06/2016  . Bilateral hearing loss 02/06/2016  . Arthritis 02/06/2016  . Anemia of chronic disease 12/05/2015  . Protein-calorie malnutrition, severe 11/13/2015  . Chronic pancreatitis ([place]) 11/13/2015  . Diabetes mellitus, type II ([place]) 11/13/2015  . Esophageal reflux 11/13/2015  . Benign essential hypertension 11/13/2015  . Hyperlipidemia 11/13/2015   Social History   Tobacco Use  . Smoking status: Never Smoker  . Smokeless tobacco: Never Used  Substance Use Topics  . Alcohol use: No  . Drug use: No   Current Medications and Allergies:   Current Outpatient Medications:  .  acetaminophen (TYLENOL) 325 MG tablet, Take 2 tablets (650 mg total) by mouth every 6 (six) hours as needed for moderate pain or headache., Disp: , Rfl:  .  Alcohol Swabs (B-D SINGLE USE SWABS REGULAR) PADS, USE TO CLEAN FINGER BEFORE  CHECKING BLOOD SUGARS AND CLEAN SKIN BEFORE INSULIN INJECTIONS., Disp: 300 each, Rfl: 2 .  Alpha-D-Galactosidase (BEANO PO), Take by mouth as needed., Disp: , Rfl:  .  Ascorbic Acid (VITAMIN C) 1000 MG tablet, Take 1,000 mg by mouth daily., Disp: , Rfl:  .  aspirin EC 81 MG tablet, Take 81 mg by mouth daily., Disp: , Rfl:  .  calcium carbonate (TUMS - DOSED IN MG ELEMENTAL CALCIUM) 500 MG chewable tablet, Chew 1 tablet by mouth daily., Disp: , Rfl:  .  esomeprazole (NEXIUM) 20 MG capsule, Take 20 mg by mouth daily at 12 noon., Disp: , Rfl:  .  feeding supplement, GLUCERNA SHAKE, (GLUCERNA SHAKE) LIQD, Take 237 mLs by mouth 3 (three) times daily between meals., Disp: , Rfl: 0 .  fluticasone (FLONASE) 50 MCG/ACT nasal spray, Place 2 sprays into both nostrils daily., Disp: 16 g, Rfl: 0 .  gabapentin (NEURONTIN) 100 MG capsule, Take 1 capsule (100 mg total) by mouth at bedtime., Disp: 30 capsule, Rfl: 3 .  glucose blood (ACCU-CHEK GUIDE) test strip, Use to test blood sugar twice daily, Disp: 100 each, Rfl: 2 .  insulin aspart (NOVOLOG FLEXPEN) 100 UNIT/ML FlexPen, Inject 3 units before breakfast and 4 units before supper, Disp: 5 pen, Rfl: 4 .  insulin degludec (TRESIBA) 100 UNIT/ML SOPN FlexTouch Pen, Inject 6 units into the skin once daily (Patient taking differently: 9 Units. Inject 8 units into the skin once daily),  Disp: 18 pen, Rfl: 1 .  Insulin Pen Needle (BD PEN NEEDLE NANO U/F) 32G X 4 MM MISC, Use to inject insulin 4 times daily, Disp: 100 each, Rfl: 2 .  isosorbide mononitrate (IMDUR) 30 MG 24 hr tablet, Take 1 tablet (30 mg total) by mouth daily. One tablet daily, Disp: 30 tablet, Rfl: 0 .  levocetirizine (XYZAL) 5 MG tablet, Take 5 mg by mouth every evening., Disp: , Rfl:  .  lovastatin (MEVACOR) 20 MG tablet, Take 20 mg by mouth at bedtime., Disp: , Rfl:  .  magnesium oxide (MAG-OX) 400 MG tablet, Take 400 mg by mouth daily as needed., Disp: , Rfl:  .  metoprolol tartrate (LOPRESSOR) 25 MG  tablet, Take 1 tablet (25 mg total) by mouth 2 (two) times daily., Disp: 180 tablet, Rfl: 2 .  Multiple Vitamins-Minerals (CENTRUM SILVER 50+WOMEN) TABS, Take 1 tablet by mouth daily., Disp: , Rfl:  .  Multiple Vitamins-Minerals (HAIR/SKIN/NAILS/BIOTIN PO), Take 1 tablet by mouth daily., Disp: , Rfl:  .  multivitamin-lutein (OCUVITE-LUTEIN) CAPS capsule, Take 1 capsule by mouth daily., Disp: , Rfl:  .  nitroGLYCERIN (NITROSTAT) 0.3 MG SL tablet, Place 0.3 mg under the tongue every 5 (five) minutes as needed for chest pain., Disp: , Rfl:  .  Omega-3 Fatty Acids (ULTRA OMEGA-3 FISH OIL PO), Take 1 capsule by mouth daily., Disp: , Rfl:  .  polyethylene glycol (MIRALAX / GLYCOLAX) packet, Take 17 g by mouth daily as needed., Disp: , Rfl:  .  Probiotic Product (PROBIOTIC FORMULA) CAPS, Take 1 capsule by mouth daily., Disp: , Rfl:  .  Turmeric Curcumin 500 MG CAPS, Take 1 capsule by mouth daily., Disp: , Rfl:  .  cefdinir (OMNICEF) 300 MG capsule, Take 1 capsule (300 mg total) by mouth 2 (two) times daily., Disp: 20 capsule, Rfl: 0  No Known Allergies Review of Systems   Pertinent items are noted in the HPI. Otherwise, ROS is negative.  Vitals:   Vitals:   11/06/17 1042 11/06/17 1121  BP: 102/64 (!) 108/48  Pulse: 70   Temp: 98.1 F (36.7 C)   TempSrc: Oral   SpO2: 97%   Weight: 115 lb (52.2 kg)   Height: 4\' 11"  (1.499 m)      Body mass index is 23.23 kg/m.   Physical Exam:   Physical Exam  Constitutional: She is oriented to person, place, and time. She appears well-developed and well-nourished. No distress.  HENT:  Head: Normocephalic and atraumatic.  Right Ear: External ear normal.  Left Ear: External ear normal.  Nose: Nose normal.  Mouth/Throat: Oropharynx is clear and moist.  Eyes: Conjunctivae and EOM are normal. Pupils are equal, round, and reactive to light.  Neck: Normal range of motion. Neck supple.  Cardiovascular: Normal rate, regular rhythm, normal heart sounds and  intact distal pulses.  Pulmonary/Chest: Effort normal and breath sounds normal.  Abdominal: Soft. Bowel sounds are normal.  Neurological: She is alert and oriented to person, place, and time.  Skin: Skin is warm. Capillary refill takes less than 2 seconds.  Psychiatric: She has a normal mood and affect. Her behavior is normal.  Nursing note and vitals reviewed.   Diabetic Foot Exam - Simple   Simple Foot Form Diabetic Foot exam was performed with the following findings:  Yes 11/07/2017 12:34 PM  Visual Inspection No deformities, no ulcerations, no other skin breakdown bilaterally:  Yes Sensation Testing Intact to touch and monofilament testing bilaterally:  Yes Pulse Check Posterior Tibialis  and Dorsalis pulse intact bilaterally:  Yes Comments    Results for orders placed or performed in visit on 09/21/17  POCT glucose (manual entry)  Result Value Ref Range   POC Glucose 101 (A) 70 - 99 mg/dl  POCT glycosylated hemoglobin (Hb A1C)  Result Value Ref Range   Hemoglobin A1C 6.8    Assessment and Plan:   Vanessa Gibson was seen today for dizziness.  Diagnoses and all orders for this visit:  Type 2 diabetes mellitus without complication, without long-term current use of insulin (HCC) Comments: NOT AT GOAL.  Followed by endocrinology.  Unfortunately, when she went to her visit, she did not have her glucometer with her.  She has been on 10 units of Triseba with sliding scale insulin for the last month.  She does bring in blood sugars today.  Her a.m. fasting blood sugars are consistently 190-250.  She is not exercising.  She believes that the food provided at her home worsens her blood sugars.  We discussed the plan for increasing her insulin until she follows up with endocrinology.  Please see AVS for instructions.  Vertigo Comments: NOT AT GOAL.  Patient is consistent with her complaints of her issues are not worsening, however she is becoming more aggressive and asking for "more help."   She denies any falls.  She denies any overt headaches, vision changes, or lightheadedness.  She has intermittent ear pressure and imbalance when turning her head or getting out of bed.  I did refer her to vestibular rehab but she does not feel that it is enough.  Previous CT of her head showed stable brain atrophy no acute processes.  She is a diabetic that is not controlled at this time.  See above.  After long discussion, I am okay with a trial of an antibiotic as below.  I do not have much hope that it will be successful and eliminating her symptoms. Orders: -     cefdinir (OMNICEF) 300 MG capsule; Take 1 capsule (300 mg total) by mouth 2 (two) times daily.  Reactive depression Comments: NOT AT GOAL.  Patient is very unhappy living in [place] and at her current facility.  We have discussed this multiple times.  She does not feel that she has any other options.  She moved to be closer to her daughter.  However, her daughter moved about an hour away shortly after settling the patient into her home.  Patient does have a son in [place] but she feels that the cost of living is too high there.  I will ask to see if she is eligible for [facility] assistance.  Acute hypotension Comments: NEW.  Orthostatics done in the office and were reassuring.  I have asked her to decrease her metoprolol by half and over the next week.  Her blood pressure and heart rate will need to be monitored closely over the next 2 weeks.  She denies any chest pain, palpitations, lightheadedness, edema, or dry mouth.  She has not changed any other medications or supplements.  Records requested if needed. Time spent with the patient: 60 minutes, of which >50% was spent in obtaining information about her symptoms, reviewing her previous labs, evaluations, and treatments, counseling her about her condition (please see the discussed topics above), and developing a plan to further investigate it; she had a number of questions which I  addressed.   . Reviewed expectations re: course of current medical issues. . Discussed self-management of symptoms. . Outlined signs and  symptoms indicating need for more acute intervention. . Patient verbalized understanding and all questions were answered. Marland Kitchen Health Maintenance issues including appropriate healthy diet, exercise, and smoking avoidance were discussed with patient. . See orders for this visit as documented in the electronic medical record. . Patient received an After Visit Summary.  Briscoe Deutscher, DO [place], [place] 11/07/2017  Future Appointments  Date Time Provider [place]  11/13/2017  8:45 AM [name], [facility] None  11/20/2017  1:00 PM Elayne Snare, MD [facility] None

## 2017-11-09 DIAGNOSIS — M1990 Unspecified osteoarthritis, unspecified site: Secondary | ICD-10-CM | POA: Diagnosis not present

## 2017-11-09 DIAGNOSIS — E1122 Type 2 diabetes mellitus with diabetic chronic kidney disease: Secondary | ICD-10-CM | POA: Diagnosis not present

## 2017-11-09 DIAGNOSIS — I129 Hypertensive chronic kidney disease with stage 1 through stage 4 chronic kidney disease, or unspecified chronic kidney disease: Secondary | ICD-10-CM | POA: Diagnosis not present

## 2017-11-09 DIAGNOSIS — M5431 Sciatica, right side: Secondary | ICD-10-CM | POA: Diagnosis not present

## 2017-11-09 DIAGNOSIS — N183 Chronic kidney disease, stage 3 (moderate): Secondary | ICD-10-CM | POA: Diagnosis not present

## 2017-11-09 DIAGNOSIS — D631 Anemia in chronic kidney disease: Secondary | ICD-10-CM | POA: Diagnosis not present

## 2017-11-09 DIAGNOSIS — R262 Difficulty in walking, not elsewhere classified: Secondary | ICD-10-CM | POA: Diagnosis not present

## 2017-11-09 DIAGNOSIS — K861 Other chronic pancreatitis: Secondary | ICD-10-CM | POA: Diagnosis not present

## 2017-11-09 DIAGNOSIS — R42 Dizziness and giddiness: Secondary | ICD-10-CM | POA: Diagnosis not present

## 2017-11-09 NOTE — Telephone Encounter (Signed)
Spoke with the patient and she stated her bs was 255 before dinner yesterday- patients states she was told to take her bs before her meals so she is now going to start taking after her meals today and see what her readings are - if they are consistently she will call the office

## 2017-11-09 NOTE — Telephone Encounter (Signed)
She should increase her NovoLog by 2 units at lunchtime

## 2017-11-09 NOTE — Telephone Encounter (Signed)
Yes, message to Endo as well BUT we addressed her insulin regimen at the last visit (increasing). My concern is that her BP was low at that time. We may need to have her come in for another evaluation.   Low BP, elevated BP could be dehydration as discussed in the phone note but could be indication of infection as well.  EW

## 2017-11-09 NOTE — Telephone Encounter (Signed)
Should this go to Endo?

## 2017-11-09 NOTE — Telephone Encounter (Signed)
Phone would not go through. Spoke with pharmacy and they transferred prescription to [facility]. It is to go out for delivery today.

## 2017-11-09 NOTE — Telephone Encounter (Signed)
Please advise on the note below as needed.

## 2017-11-10 NOTE — Telephone Encounter (Signed)
Patient stated she is only having trouble in the evening so she is going to take 2 extra units at supper of the Novolog- she does not take novolog at lunchtime because she states she was never told to- her bs after supper last night was 289- I advised the patient to take the 2 extra units of novolog for a couple of days and if her bs readings in the evening do not go down she should give [place] a call back

## 2017-11-10 NOTE — Telephone Encounter (Signed)
Left message for patient to return call to see how she is doing. If her symptoms are not better then may need to come back in to be seen.

## 2017-11-11 NOTE — Telephone Encounter (Signed)
Error

## 2017-11-11 NOTE — Telephone Encounter (Signed)
L/m for verbal on voice mail.

## 2017-11-12 ENCOUNTER — Ambulatory Visit: Payer: Self-pay | Admitting: *Deleted

## 2017-11-12 ENCOUNTER — Telehealth: Payer: Self-pay

## 2017-11-12 NOTE — Telephone Encounter (Signed)
Per Dr. Juleen China patient needs follow up app with her. L/m To call office.

## 2017-11-12 NOTE — Telephone Encounter (Signed)
  Reason for Disposition . [1] Blood glucose > 300 mg/dl (16.5 mmol/l) AND [2] two or more times in a row  Answer Assessment - Initial Assessment Questions 1. BLOOD GLUCOSE: "What is your blood glucose level?"      335 2. ONSET: "When did you check the blood glucose?"     20 minutes ago 3. USUAL RANGE: "What is your glucose level usually?" (e.g., usual fasting morning value, usual evening value)     180 fasting- 169( in am)  269,240,200 ( pm) 4. KETONES: "Do you check for ketones (urine or blood test strips)?" If yes, ask: "What does the test show now?"      no 5. TYPE 1 or 2:  "Do you know what type of diabetes you have?"  (e.g., Type 1, Type 2, Gestational; doesn't know)      Type 2 6. INSULIN: "Do you take insulin?" If yes, ask: "Have you missed any shots recently?"     Nova log in am/pm  Trac eva 12 mg 7. DIABETES PILLS: "Do you take any pills for your diabetes?" If yes, ask: "Have you missed taking any pills recently?"     no 8. OTHER SYMPTOMS: "Do you have any symptoms?" (e.g., fever, frequent urination, difficulty breathing, dizziness, weakness, vomiting)     Fatigue- shakey, dizziness 9. PREGNANCY: "Is there any chance you are pregnant?" "When was your last menstrual period?"     n/a  Protocols used: DIABETES - HIGH BLOOD SUGAR-A-AH

## 2017-11-12 NOTE — Telephone Encounter (Signed)
Patient [place] called stating that patient had a blood sugar of 335 and needed help with her scale. I placed the call on hold and asked Cassie, clinical lead on what I should advise the patient on. Cassie stated the patient needs to come in for a follow up as requested by JoEllen. I suggested tomorrow for an appointment however, the patient declined. I looked at Monday however, the appointments were only for same day availability. I asked Cassie if I should override however, she stated have the clinical team call to ensure this is okay. Call patient to advise tomorrow.

## 2017-11-12 NOTE — Telephone Encounter (Deleted)
Patient [place] called stating that patient had a blood sugar of 335 and needed help with her scale. I placed the call on hold and asked Cassie, clinical lead on what I should advise the patient on. Cassie stated the patient needs to come in for a follow up as requested by JoEllen. I suggested tomorrow for an appointment however, the patient declined. I looked at Monday however, the appointments were only for same day availability. I asked Cassie if I should override however, she stated have the clinical team call to ensure this is okay. Call patient to advise tomorrow.

## 2017-11-13 ENCOUNTER — Ambulatory Visit: Payer: Medicare HMO | Admitting: Family Medicine

## 2017-11-13 ENCOUNTER — Telehealth: Payer: Self-pay | Admitting: Family Medicine

## 2017-11-13 ENCOUNTER — Other Ambulatory Visit: Payer: Self-pay | Admitting: Pharmacist

## 2017-11-13 ENCOUNTER — Encounter: Payer: Self-pay | Admitting: Family Medicine

## 2017-11-13 VITALS — BP 110/64 | HR 80 | Temp 98.2°F | Ht 59.0 in | Wt 114.0 lb

## 2017-11-13 DIAGNOSIS — E119 Type 2 diabetes mellitus without complications: Secondary | ICD-10-CM | POA: Diagnosis not present

## 2017-11-13 DIAGNOSIS — M5431 Sciatica, right side: Secondary | ICD-10-CM | POA: Diagnosis not present

## 2017-11-13 DIAGNOSIS — R262 Difficulty in walking, not elsewhere classified: Secondary | ICD-10-CM | POA: Diagnosis not present

## 2017-11-13 DIAGNOSIS — R350 Frequency of micturition: Secondary | ICD-10-CM

## 2017-11-13 DIAGNOSIS — I129 Hypertensive chronic kidney disease with stage 1 through stage 4 chronic kidney disease, or unspecified chronic kidney disease: Secondary | ICD-10-CM | POA: Diagnosis not present

## 2017-11-13 DIAGNOSIS — R42 Dizziness and giddiness: Secondary | ICD-10-CM | POA: Diagnosis not present

## 2017-11-13 DIAGNOSIS — K219 Gastro-esophageal reflux disease without esophagitis: Secondary | ICD-10-CM | POA: Diagnosis not present

## 2017-11-13 DIAGNOSIS — M1990 Unspecified osteoarthritis, unspecified site: Secondary | ICD-10-CM | POA: Diagnosis not present

## 2017-11-13 DIAGNOSIS — E1122 Type 2 diabetes mellitus with diabetic chronic kidney disease: Secondary | ICD-10-CM | POA: Diagnosis not present

## 2017-11-13 DIAGNOSIS — D631 Anemia in chronic kidney disease: Secondary | ICD-10-CM | POA: Diagnosis not present

## 2017-11-13 DIAGNOSIS — N183 Chronic kidney disease, stage 3 (moderate): Secondary | ICD-10-CM | POA: Diagnosis not present

## 2017-11-13 DIAGNOSIS — K861 Other chronic pancreatitis: Secondary | ICD-10-CM | POA: Diagnosis not present

## 2017-11-13 LAB — POCT URINALYSIS DIPSTICK
BILIRUBIN UA: NEGATIVE
Blood, UA: NEGATIVE
GLUCOSE UA: NEGATIVE
Leukocytes, UA: NEGATIVE
Nitrite, UA: NEGATIVE
Protein, UA: NEGATIVE
Spec Grav, UA: >=1.03 — AB (ref 1.010–1.025)
Urobilinogen, UA: 0.2 E.U./dL
pH, UA: 5 (ref 5.0–8.0)

## 2017-11-13 MED ORDER — ESOMEPRAZOLE MAGNESIUM 40 MG PO CPDR: 40.0000 mg | DELAYED_RELEASE_CAPSULE | Freq: Every day | ORAL | 0 refills | Status: DC

## 2017-11-13 NOTE — Progress Notes (Signed)
    Subjective:  Vanessa Gibson is a 83 y.o. female who presents today with a chief complaint of type 2 diabetes.   HPI:  DIABETES Type II, established problem, Stable Patient currently prescribed tresiba 8 units daily and NovoLog 3 units with breakfast and 4 units with dinner.  She reports compliance to this regimen.  Patient is concerned because she had one elevated reading of 330 yesterday.  Denies any symptoms during this episode.  Per her glucometer, fasting sugars are usually in the 150-180 range.  She did have a high of 330.  Low was around 120.  Denies any symptomatic lows.  No hypoglycemic symptoms-palpitations, tremors, anxiousness.  GERD, established problem, worsening Patient reports worsening symptoms over the last couple of days.  She does not have any regurgitation symptoms however does report that after she eats she gets a burning sensation in the middle of her chest.  No nausea or vomiting.  The sensation will resolve shortly after eating.  She has a history of GERD however is not currently on any medication for this.  No weight loss.  No early satiety.  Urinary frequency, new problem Several year history.  Worsened over the last several weeks.  Noticed that she has to go to the bathroom frequently.  No dysuria.  No abdominal pain.  No fevers or chills.  No treatments tried.  States that she was on something for this in the past but does not remember the name of it.  ROS: Per HPI  PMH: Smoking history reviewed. Never smoker.   Objective:  Physical Exam: BP 110/64 (BP Location: Right Arm, Patient Position: Sitting, Cuff Size: Normal)   Pulse 80   Temp 98.2 F (36.8 C) (Oral)   Ht 4\' 11"  (1.499 m)   Wt 114 lb (51.7 kg)   SpO2 96%   BMI 23.03 kg/m   Gen: NAD, resting comfortably CV: RRR with no murmurs appreciated Pulm: NWOB, CTAB with no crackles, wheezes, or rhonchi GI: Normal bowel sounds present. Soft, Nontender, Nondistended. MSK: No edema, cyanosis, or clubbing  noted Skin: Warm, dry Neuro: Grossly normal, moves all extremities Psych: Normal affect and thought content   Assessment/Plan:  Diabetes mellitus, type II ([place]) Patient did have one high blood sugar level of 330 on her glucometer, however the rest of her values were in the low 200s to upper 100s range.  Given her elderly state and CKD, would be hesitant to make large changes today.    Discussed treatment options with patient.  She stated that she was currently on tresiba 12 units daily and was instructed to increase by 2 units every time she had a value greater than 150.  Advised patient to increase her tresiba as previously directed.  No changes made to her NovoLog today.  She will follow-up with endocrinology in 1 week.  Esophageal reflux Send in a prescription for Nexium.  Advised patient to take this for the next couple of weeks to see if it alleviated her symptoms.  Advised patient that she should not be on this medication long-term.  She will follow-up with her PCP within the next few weeks if symptoms are not improving.  Urinary frequency Check UA and urine culture to rule out UTI.  She likely has underlying overactive bladder.  If above negative, may benefit from starting antispasmodic agent.  Will defer to PCP for this decision.  Algis Greenhouse. Jerline Pain, MD 11/13/2017 4:42 PM

## 2017-11-13 NOTE — Assessment & Plan Note (Signed)
Send in a prescription for Nexium.  Advised patient to take this for the next couple of weeks to see if it alleviated her symptoms.  Advised patient that she should not be on this medication long-term.  She will follow-up with her PCP within the next few weeks if symptoms are not improving.

## 2017-11-13 NOTE — Telephone Encounter (Signed)
Copied from [place] [id number]. Topic: Quick Communication - See Telephone Encounter >> Nov 13, 2017  3:52 PM Vernona Rieger wrote: CRM for notification. See Telephone encounter for:   11/13/17.  Lori from [facility] Needs orders for nursing for [facility] to go out atleast one more time to manage the medicine mgt and incontinence mgt. Call back is [phone]

## 2017-11-13 NOTE — Patient Instructions (Addendum)
No changes to your insulin today.  I will send in nexium for you for your reflux.  We will check urine sample today.  Please follow up with Dr Juleen China soon.  Take care, Dr Jerline Pain

## 2017-11-13 NOTE — Patient Outreach (Signed)
[facility]) Care Management  11/13/2017  Vanessa Gibson 12-Aug-1928 [id number]   Called patient's son-in-law (on HIPAA) to see if the patient received her Novolog and he said she did not. Novo Nordisk was called four times and eventually a Freight forwarder spoke with me and said he was putting in the order for the patient today and it should be delivered right after the new year.  Plan:  Follow up on delivery in the first few weeks of January.  [name], PharmD, [employer] Clinical Pharmacist [phone]

## 2017-11-13 NOTE — Telephone Encounter (Signed)
Patient has an appointment today at 1 PM.

## 2017-11-13 NOTE — Assessment & Plan Note (Addendum)
Patient did have one high blood sugar level of 330 on her glucometer, however the rest of her values were in the low 200s to upper 100s range.  Given her elderly state and CKD, would be hesitant to make large changes today.    Discussed treatment options with patient.  She stated that she was currently on tresiba 12 units daily and was instructed to increase by 2 units every time she had a value greater than 150.  Advised patient to increase her tresiba as previously directed.  No changes made to her NovoLog today.  She will follow-up with endocrinology in 1 week.

## 2017-11-13 NOTE — Telephone Encounter (Signed)
Patient is coming in today at 1 PM

## 2017-11-13 NOTE — Telephone Encounter (Signed)
Patient is coming in today for an appointment.

## 2017-11-14 LAB — URINE CULTURE
MICRO NUMBER:: 81439731
RESULT: NO GROWTH
SPECIMEN QUALITY:: ADEQUATE

## 2017-11-16 NOTE — Telephone Encounter (Signed)
Left message for home health to call back. OK to give verbal for these orders.

## 2017-11-16 NOTE — Progress Notes (Signed)
Urine culture negative. Patient should follow up with her PCP regarding her urinary frequency.  Algis Greenhouse. Jerline Pain, MD 11/16/2017 8:22 AM

## 2017-11-18 ENCOUNTER — Telehealth: Payer: Self-pay

## 2017-11-18 NOTE — Telephone Encounter (Signed)
Patient called today because she is having transportation issues and may not be able to come to her next appointment but she is very worried about her evening blood sugar readings which have been: [phone] Please advise if there needs to be any changes in medication

## 2017-11-19 ENCOUNTER — Telehealth: Payer: Self-pay | Admitting: Family Medicine

## 2017-11-19 DIAGNOSIS — N183 Chronic kidney disease, stage 3 (moderate): Secondary | ICD-10-CM | POA: Diagnosis not present

## 2017-11-19 DIAGNOSIS — D631 Anemia in chronic kidney disease: Secondary | ICD-10-CM | POA: Diagnosis not present

## 2017-11-19 DIAGNOSIS — M5431 Sciatica, right side: Secondary | ICD-10-CM | POA: Diagnosis not present

## 2017-11-19 DIAGNOSIS — K861 Other chronic pancreatitis: Secondary | ICD-10-CM | POA: Diagnosis not present

## 2017-11-19 DIAGNOSIS — R42 Dizziness and giddiness: Secondary | ICD-10-CM | POA: Diagnosis not present

## 2017-11-19 DIAGNOSIS — E1122 Type 2 diabetes mellitus with diabetic chronic kidney disease: Secondary | ICD-10-CM | POA: Diagnosis not present

## 2017-11-19 DIAGNOSIS — I129 Hypertensive chronic kidney disease with stage 1 through stage 4 chronic kidney disease, or unspecified chronic kidney disease: Secondary | ICD-10-CM | POA: Diagnosis not present

## 2017-11-19 DIAGNOSIS — M1990 Unspecified osteoarthritis, unspecified site: Secondary | ICD-10-CM | POA: Diagnosis not present

## 2017-11-19 DIAGNOSIS — R262 Difficulty in walking, not elsewhere classified: Secondary | ICD-10-CM | POA: Diagnosis not present

## 2017-11-19 NOTE — Telephone Encounter (Signed)
FYI

## 2017-11-19 NOTE — Telephone Encounter (Signed)
Copied from [place] [id number]. Topic: Inquiry >> Nov 19, 2017  1:31 PM Oliver Pila B wrote: Reason for CRM: [facility] called to state the pt has been discharged from PT; all goals were met and pt was recommended to continue nursing, **phone call was dropped no contact info was left**

## 2017-11-20 ENCOUNTER — Encounter: Payer: Self-pay | Admitting: Endocrinology

## 2017-11-20 ENCOUNTER — Ambulatory Visit: Payer: Medicare HMO | Admitting: Endocrinology

## 2017-11-20 VITALS — BP 120/64 | HR 77 | Ht 59.0 in | Wt 114.8 lb

## 2017-11-20 DIAGNOSIS — Z794 Long term (current) use of insulin: Secondary | ICD-10-CM

## 2017-11-20 DIAGNOSIS — Z8639 Personal history of other endocrine, nutritional and metabolic disease: Secondary | ICD-10-CM

## 2017-11-20 DIAGNOSIS — E1165 Type 2 diabetes mellitus with hyperglycemia: Secondary | ICD-10-CM | POA: Diagnosis not present

## 2017-11-20 NOTE — Progress Notes (Signed)
Patient ID: Vanessa Gibson, female   DOB: 1928/09/27, 83 y.o.   MRN: [id number]           Reason for Appointment: Follow-up for Type 2 Diabetes  Referring physician: Drema Dallas   History of Present Illness:          Date of diagnosis of type 2 diabetes mellitus: 1997        Background history:   She had been treated with metformin mostly since her diagnosis At some point was given glipizide/metformin combination, detailed history is not available from PCP Also not clear what level of control she has had until recently  Recent history:   Non-insulin hypoglycemic drugs the patient is taking are: none   INSULIN dose: Tresiba 12 units daily at night,  Novolog 6 units at breakfast and 6 supper  Her last A1c was better at 6.8, previously was high at 8.9   Current management, blood sugar patterns and problems identified:  She did not bring her blood sugar monitor for download on her last visit but hasn't done so this time  However despite instructions for variable testing times she is only checking readings before breakfast and suppertime  She had been seen by her PCP about 10 days ago and was told to increase her insulin doses by 2 units all around  However she still appears to have significantly high readings before supper and only rarely below 200  FASTING readings are looking a little better in the last week or so  Although she has had some nonspecific headaches and has been given antibiotic recently she has not had any steroids or any obvious infections because of her high sugars  She is again trying to avoid high carbohydrate foods and drinks with sugar  Currently not taking any coverage for LUNCH although she is mostly eating about a half sandwich only usually  She thinks she is remembering to take her insulin doses consistently including pre-meal Novolog  No hypoglycemia reported  She is asking about switching to oral drugs instead of insulin        Side effects from  medications have been: ?  Nausea from metformin  Compliance with the medical regimen: Fairly good  Hypoglycemia: None    Glucose monitoring:  done 1-2 times a day         Glucometer:  Accu-Chek  Blood Glucose readings  by   Mean values apply above for all meters except median for One Touch  PRE-MEAL Fasting Lunch Dinner Bedtime Overall  Glucose range:  1 38-243   1 72-340  271, 2 89    Mean/median: 176   262    212+/-56    POST-MEAL PC Breakfast PC Lunch PC Dinner  Glucose range:     Mean/median:       Self-care: The diet that the patient has been following is: tries to limit sweets  She has her meals with assisted living facility     Typical meal intake: Breakfast is cereal, sometimes bacon.  At meals will get fried food at times            Dietician visit, most recent: Never CDE visit: 1/18               Exercise:  walking and calisthenics up to 30 minutes twice a week   Weight history: Previous range 98-140   Wt Readings from Last 3 Encounters:  11/20/17 114 lb 12.8 oz (52.1 kg)  11/13/17 114 lb (51.7 kg)  11/06/17  115 lb (52.2 kg)    Glycemic control:  A1c in October 2017 = 8.5    Lab Results  Component Value Date   HGBA1C 6.8 09/21/2017   HGBA1C 8.9 (H) 03/30/2017   HGBA1C 9.2 (H) 02/02/2017   Lab Results  Component Value Date   MICROALBUR <0.7 11/14/2016   LDLCALC 90 02/02/2017   CREATININE 1.24 (H) 05/25/2017   Lab Results  Component Value Date   MICRALBCREAT 1.8 11/14/2016       Allergies as of 11/20/2017   No Known Allergies     Medication List        Accurate as of 11/20/17 11:59 PM. Always use your most recent med list.          acetaminophen 325 MG tablet Commonly known as:  TYLENOL Take 2 tablets (650 mg total) by mouth every 6 (six) hours as needed for moderate pain or headache.   aspirin EC 81 MG tablet Take 81 mg by mouth daily.   B-D SINGLE USE SWABS REGULAR Pads USE TO CLEAN FINGER BEFORE CHECKING BLOOD SUGARS AND CLEAN  SKIN BEFORE INSULIN INJECTIONS.   BEANO PO Take by mouth as needed.   calcium carbonate 500 MG chewable tablet Commonly known as:  TUMS - dosed in mg elemental calcium Chew 1 tablet by mouth daily.   cefdinir 300 MG capsule Commonly known as:  OMNICEF Take 1 capsule (300 mg total) by mouth 2 (two) times daily.   esomeprazole 40 MG capsule Commonly known as:  NEXIUM Take 1 capsule (40 mg total) by mouth daily.   feeding supplement (GLUCERNA SHAKE) Liqd Take 237 mLs by mouth 3 (three) times daily between meals.   fluticasone 50 MCG/ACT nasal spray Commonly known as:  FLONASE Place 2 sprays into both nostrils daily.   gabapentin 100 MG capsule Commonly known as:  NEURONTIN Take 1 capsule (100 mg total) by mouth at bedtime.   glucose blood test strip Commonly known as:  ACCU-CHEK GUIDE Use to test blood sugar twice daily   insulin aspart 100 UNIT/ML FlexPen Commonly known as:  NOVOLOG FLEXPEN Inject 3 units before breakfast and 4 units before supper   insulin degludec 100 UNIT/ML Sopn FlexTouch Pen Commonly known as:  TRESIBA Inject 6 units into the skin once daily   Insulin Pen Needle 32G X 4 MM Misc Commonly known as:  BD PEN NEEDLE NANO U/F Use to inject insulin 4 times daily   isosorbide mononitrate 30 MG 24 hr tablet Commonly known as:  IMDUR Take 1 tablet (30 mg total) by mouth daily. One tablet daily   levocetirizine 5 MG tablet Commonly known as:  XYZAL Take 5 mg by mouth every evening.   lovastatin 20 MG tablet Commonly known as:  MEVACOR Take 20 mg by mouth at bedtime.   magnesium oxide 400 MG tablet Commonly known as:  MAG-OX Take 400 mg by mouth daily as needed.   metoprolol tartrate 25 MG tablet Commonly known as:  LOPRESSOR Take 1 tablet (25 mg total) by mouth 2 (two) times daily.   multivitamin-lutein Caps capsule Take 1 capsule by mouth daily.   HAIR/SKIN/NAILS/BIOTIN PO Take 1 tablet by mouth daily.   CENTRUM SILVER 50+WOMEN  Tabs Take 1 tablet by mouth daily.   nitroGLYCERIN 0.3 MG SL tablet Commonly known as:  NITROSTAT Place 0.3 mg under the tongue every 5 (five) minutes as needed for chest pain.   polyethylene glycol packet Commonly known as:  MIRALAX / GLYCOLAX Take 17 g  by mouth daily as needed.   PROBIOTIC FORMULA Caps Take 1 capsule by mouth daily.   Turmeric Curcumin 500 MG Caps Take 1 capsule by mouth daily.   ULTRA OMEGA-3 FISH OIL PO Take 1 capsule by mouth daily.   vitamin C 1000 MG tablet Take 1,000 mg by mouth daily.       Allergies: No Known Allergies  Past Medical History:  Diagnosis Date  . Anxiety   . Arthritis   . Breast cancer ([place]), bilateral   . Chronic pancreatitis ([place])   . Daily headache   . Depression   . Dizziness   . GERD (gastroesophageal reflux disease)   . Giddiness   . History of hiatal hernia   . Hyperlipemia   . Hypertension   . IBS (irritable bowel syndrome)   . Mandibular fracture ([place]) 12/04/2015  . MI (myocardial infarction) ([place])   . Skin cancer of face   . Type II diabetes mellitus ([place])     Past Surgical History:  Procedure Laterality Date  . BREAST SURGERY    . CATARACT EXTRACTION W/ INTRAOCULAR LENS  IMPLANT, BILATERAL Bilateral   . CHOLECYSTECTOMY OPEN  1970's  . MASTECTOMY Bilateral     No family history on file.  Social History:  reports that  has never smoked. she has never used smokeless tobacco. She reports that she does not drink alcohol or use drugs.   Review of Systems   Lipid history:  She is on lovastatin 20 mg from her PCP With the following results    Lab Results  Component Value Date   CHOL 163 02/02/2017   HDL 48.70 02/02/2017   LDLCALC 90 02/02/2017   TRIG 120.0 02/02/2017   CHOLHDL 3 02/02/2017           Hypertension: Has only minimal hypertension She is on metoprolol 25 mg, Has history of CAD  HYPERKALEMIA: She has not had any follow-up, was asked to see her PCP about this  Lab Results   Component Value Date   CREATININE 1.24 (H) 05/25/2017   BUN 33 (H) 05/25/2017   NA 138 05/25/2017   K 4.9 05/25/2017   CL 104 05/25/2017   CO2 27 05/25/2017    Most recent eye exam was 07/2016, reportedly no retinopathy  Most recent foot exam: 11/05/17  She continues to have some nonspecific frontal headaches and some sinus congestion not better with antibiotics    Physical Examination:  BP 120/64   Pulse 77   Ht 4\' 11"  (1.499 m)   Wt 114 lb 12.8 oz (52.1 kg)   SpO2 98%   BMI 23.19 kg/m        ASSESSMENT:  Diabetes type 2, uncontrolled, nonobese, insulin-requiring  See history of present illness for detailed discussion of current diabetes management, blood sugar patterns and problems identified  Despite her taking basal bolus insulin regimen and gradually increasing doses for the last few months she still has progression of her hyperglycemia This indicates insulin deficiency as okay with her nonobese status Doesn't appear that her blood sugars are higher from going off her diet especially since she is eating smaller meals at lunch and no snacks in the afternoon causing her blood sugars to be over 200 in the afternoon before suppertime She has only a couple of readings after supper which are not higher than before eating Most likely she needs additional insulin coverage at lunchtime despite her small meal at that time Currently fasting blood sugars are approaching normal with  12 units of Tresiba No recent labs available to objectively assess her control although A1c was fairly good at 6.8 on the last time  Her A1c appears to be better at 6.8 today  History of hyperkalemia:   needs follow-up, possibly related to renal handling of potassium  PLAN:   She will need to start  [place] 4 units before lunch unless skipping food intake totally at that time She was advised to not check her blood sugars just in the morning and suppertime and more after meals She was given a  blood sugar diary and indicated in this went to check her blood sugars, alternating breakfast and suppertime with lunch and bedtime She will not increase her other doses quite as yet but she will call next week to report her blood sugars Given written instructions and this was reviewed in detail with her and her daughter Explained to her that she is unlikely to respond to oral hypoglycemic vacations and most brand name medications such as Invokana would be expensive for her She needs to bring her monitor for download on each visit Also recommended follow-up with dietitian and nurse educator but she does not want to do this now   Follow-up with PCP for other problems and repeat lab work   Patient Instructions  ADD 4 UNITS NOVOLOG AT LUNCH  CONTINUE  Tresiba 12 units daily at night,  6 units at breakfast and 6 supper   Check blood sugars on waking up  EVERY 2 DAYS  Also check blood sugars about 2-3 hours after a meal and do this after different meals by rotation  Recommended blood sugar levels on waking up is 90-130 and about 2 hours after meal is 130-180  Please bring your blood sugar monitor to each visit, thank you  CALL readings in 1 week   Counseling time on subjects discussed in assessment and plan sections is over 50% of today's 25 minute visit     Vanessa Gibson 11/22/2017, 4:02 PM   Note: This office note was prepared with Dragon voice recognition system technology. Any transcriptional errors that result from this process are unintentional.

## 2017-11-20 NOTE — Patient Instructions (Addendum)
ADD 4 UNITS NOVOLOG AT LUNCH  CONTINUE  Tresiba 12 units daily at night,  6 units at breakfast and 6 supper   Check blood sugars on waking up  EVERY 2 DAYS  Also check blood sugars about 2-3 hours after a meal and do this after different meals by rotation  Recommended blood sugar levels on waking up is 90-130 and about 2 hours after meal is 130-180  Please bring your blood sugar monitor to each visit, thank you  CALL readings in 1 week

## 2017-11-23 ENCOUNTER — Telehealth: Payer: Self-pay | Admitting: Family Medicine

## 2017-11-23 NOTE — Telephone Encounter (Signed)
Copied from [place]. Topic: General - Other >> Nov 23, 2017 11:22 AM Yvette Rack wrote: Reason for CRM: patient calling about LAB RESULTS

## 2017-11-25 ENCOUNTER — Telehealth: Payer: Self-pay | Admitting: Family Medicine

## 2017-11-25 ENCOUNTER — Telehealth: Payer: Self-pay | Admitting: Endocrinology

## 2017-11-25 DIAGNOSIS — D631 Anemia in chronic kidney disease: Secondary | ICD-10-CM | POA: Diagnosis not present

## 2017-11-25 DIAGNOSIS — N183 Chronic kidney disease, stage 3 (moderate): Secondary | ICD-10-CM | POA: Diagnosis not present

## 2017-11-25 DIAGNOSIS — R42 Dizziness and giddiness: Secondary | ICD-10-CM | POA: Diagnosis not present

## 2017-11-25 DIAGNOSIS — K861 Other chronic pancreatitis: Secondary | ICD-10-CM | POA: Diagnosis not present

## 2017-11-25 DIAGNOSIS — R262 Difficulty in walking, not elsewhere classified: Secondary | ICD-10-CM | POA: Diagnosis not present

## 2017-11-25 DIAGNOSIS — I129 Hypertensive chronic kidney disease with stage 1 through stage 4 chronic kidney disease, or unspecified chronic kidney disease: Secondary | ICD-10-CM | POA: Diagnosis not present

## 2017-11-25 DIAGNOSIS — M5431 Sciatica, right side: Secondary | ICD-10-CM | POA: Diagnosis not present

## 2017-11-25 DIAGNOSIS — E1122 Type 2 diabetes mellitus with diabetic chronic kidney disease: Secondary | ICD-10-CM | POA: Diagnosis not present

## 2017-11-25 DIAGNOSIS — M1990 Unspecified osteoarthritis, unspecified site: Secondary | ICD-10-CM | POA: Diagnosis not present

## 2017-11-25 NOTE — Telephone Encounter (Signed)
Think this is your patient.  

## 2017-11-25 NOTE — Telephone Encounter (Signed)
Called given verbals from [place].

## 2017-11-25 NOTE — Telephone Encounter (Signed)
Patient stated that her medication tresiba is causing b/s to go to low 100dreds. Please advise on what to do.

## 2017-11-25 NOTE — Telephone Encounter (Signed)
Attempted to call patient with lab results.  Left message.  CRM created.  I have tried to call her multiple times with no success.  Mailed lab letter.

## 2017-11-25 NOTE — Telephone Encounter (Signed)
Please find out when her blood sugars are low normal and exactly what readings she has.  Also has she checked after dinner?  Her desired range before breakfast is 100-130

## 2017-11-25 NOTE — Telephone Encounter (Signed)
Please advise 

## 2017-11-25 NOTE — Telephone Encounter (Signed)
Her blood sugar levels are in the lower hundreds in the mornings and she is having readings in the 100's after dinner also. She stated around 198 but was not able to give me definitive readings at certain times. She stated that she has not had readings that low in a while and was concerned.

## 2017-11-25 NOTE — Telephone Encounter (Signed)
Please advise ok to give verbal

## 2017-11-25 NOTE — Telephone Encounter (Signed)
Copied from [place] [id number]. Topic: Inquiry >> Nov 25, 2017  9:12 AM Malena Catholic I, NT wrote: Reason for CRM: [facility] [place] for home care Nurse for Med management ,Diabetes Management  and  Anxiety management for more in for call Cecille Rubin @[phone]

## 2017-11-25 NOTE — Telephone Encounter (Signed)
Her readings look perfect for her.  She needs to call only if her blood sugars are consistently below 100 or persistently over 200 at any time.  She can continue the same insulin for now

## 2017-11-26 ENCOUNTER — Telehealth: Payer: Self-pay

## 2017-11-26 NOTE — Telephone Encounter (Signed)
Patient called this morning very concerned about her bs she stated her bs was 83 this morning when she woke up and it has been low like that in the mornings and at night before bed it has been high This am before breakfast-83 Last night before bed-208  Patient wants to know if she should decrease the tresiba- and she wants to know if her bs is low in the morning should she take her insulin? Please advise

## 2017-11-26 NOTE — Telephone Encounter (Signed)
She can reduce her Vanessa Gibson to 10 units instead of 12, she will need to take her other insulin doses as normal today

## 2017-11-26 NOTE — Telephone Encounter (Signed)
Spoke to patient and she stated an understanding

## 2017-11-26 NOTE — Telephone Encounter (Signed)
I contacted the patient and advised of message via voicemail. Requested a call back if the patient would like to discuss further.  

## 2017-11-27 DIAGNOSIS — I129 Hypertensive chronic kidney disease with stage 1 through stage 4 chronic kidney disease, or unspecified chronic kidney disease: Secondary | ICD-10-CM | POA: Diagnosis not present

## 2017-11-27 DIAGNOSIS — R42 Dizziness and giddiness: Secondary | ICD-10-CM | POA: Diagnosis not present

## 2017-11-27 DIAGNOSIS — M1990 Unspecified osteoarthritis, unspecified site: Secondary | ICD-10-CM | POA: Diagnosis not present

## 2017-11-27 DIAGNOSIS — D631 Anemia in chronic kidney disease: Secondary | ICD-10-CM | POA: Diagnosis not present

## 2017-11-27 DIAGNOSIS — N183 Chronic kidney disease, stage 3 (moderate): Secondary | ICD-10-CM | POA: Diagnosis not present

## 2017-11-27 DIAGNOSIS — K861 Other chronic pancreatitis: Secondary | ICD-10-CM | POA: Diagnosis not present

## 2017-11-27 DIAGNOSIS — E1122 Type 2 diabetes mellitus with diabetic chronic kidney disease: Secondary | ICD-10-CM | POA: Diagnosis not present

## 2017-11-27 DIAGNOSIS — M5431 Sciatica, right side: Secondary | ICD-10-CM | POA: Diagnosis not present

## 2017-11-27 DIAGNOSIS — R262 Difficulty in walking, not elsewhere classified: Secondary | ICD-10-CM | POA: Diagnosis not present

## 2017-11-28 ENCOUNTER — Encounter: Payer: Self-pay | Admitting: Family Medicine

## 2017-11-28 NOTE — Progress Notes (Signed)
CONVERTING FROM STAFF MESSAGE: Briscoe Deutscher       Hello Dr. Juleen China.   I received an email from our PT advising that he has had a hard time getting in touch with you regarding this patient. He asked that I pass the following message along to you. Please let us know if you need anything further.  Thank you  [place]  [phone]   Please update MD with following message from Herbert Deaner PT.   PT has DCed- pt report feeling overwhelmed with visits/activities.  PT was unable to get updated medication list from PCP- did not have it and was confused about what she should be taking. Pt continues to have symtoms of depression and willing to address with medication. Pt declined ST referral for dementia.   Pt has need for RN to assist with medication/dx management.   Last I heard the RN has been unsuccessful in getting orders past the Eval and is looking to [facility]  [phone]

## 2017-11-30 ENCOUNTER — Other Ambulatory Visit: Payer: Self-pay | Admitting: Pharmacist

## 2017-11-30 NOTE — Telephone Encounter (Signed)
Patient is calling for her b/s  Readings please advise

## 2017-11-30 NOTE — Patient Outreach (Signed)
[facility]) Care Management  11/30/2017  Vanessa Gibson [birth year] [id number]   Patient's son-in-law was called on her behalf about delivery of Novolog Pens. HIPAA identifiers were obtained.  Novo Nordisk was also called. The representative, Arnette Norris) confirmed an order had been placed on 11/13/17 and delivered on 11/26/17.  The delivery went to Dr. Ronnie Derby office and was signed for by Mel Almond.  Patient's son-in-law said he would check with the office and get the shipment picked up.  He also said he would contact me back if there was an issue with the order.  Plan: Close patient's pharmacy case again.   [name], PharmD, [place] Clinical Pharmacist [phone]

## 2017-12-01 ENCOUNTER — Other Ambulatory Visit: Payer: Self-pay

## 2017-12-01 ENCOUNTER — Telehealth: Payer: Self-pay

## 2017-12-01 NOTE — Telephone Encounter (Signed)
Patient reporting her bs is very low again at 27 and she does not want to take her insulin- patient states again that she feels she is taking too much and is scared she will drop too low and pass out please advise

## 2017-12-01 NOTE — Telephone Encounter (Signed)
Patient called back and we discussed her bs readings- I asked her to give me a few days readings so that the doctor can view them they are as follows: 11/29/17- before breakfast 104             Before lunch 83             Before dinner 196  11/30/17- before breakfast 114             Before dinner 119             Before bed 223  12/01/17- before breakfast 89              At 3 pm today 64  Patient feels weak like she is going to pass out- she feels that she is taking too much [name] please advise

## 2017-12-01 NOTE — Telephone Encounter (Signed)
Left vm requesting patient call me back as soon as possible to discuss her bs readings

## 2017-12-01 NOTE — Telephone Encounter (Addendum)
Patient has question, she is asking you to call her back.

## 2017-12-01 NOTE — Telephone Encounter (Signed)
She can reduce her Vanessa Gibson by 2 units but need to know what dose she is currently taking, she was supposed to have reduced the dose last week

## 2017-12-02 ENCOUNTER — Other Ambulatory Visit: Payer: Self-pay | Admitting: Endocrinology

## 2017-12-02 DIAGNOSIS — E1165 Type 2 diabetes mellitus with hyperglycemia: Secondary | ICD-10-CM | POA: Insufficient documentation

## 2017-12-02 DIAGNOSIS — Z794 Long term (current) use of insulin: Principal | ICD-10-CM

## 2017-12-02 NOTE — Telephone Encounter (Signed)
Gave patient the advise below and forwarding to [facility] for scheduling

## 2017-12-02 NOTE — Telephone Encounter (Signed)
Her sugars were higher yesterday evening because of being low in the afternoon.  No change in other insulin doses as yet.  If she is eating a very light meal at lunchtime and she does not need to take any Novolog at lunch.  Will have her see Vanessa Gibson for more education

## 2017-12-02 NOTE — Telephone Encounter (Signed)
She is currently taking 10 units of Tyler Aas so she is going to take 8 units starting tonight- patient also stated her bs was 333 at dinner and 233 before bed so she is still feeling like the evening sugars are too high is there anything you can recommend for that please advise

## 2017-12-08 NOTE — Telephone Encounter (Signed)
Appt. Scheduled for next week

## 2017-12-09 DIAGNOSIS — E1122 Type 2 diabetes mellitus with diabetic chronic kidney disease: Secondary | ICD-10-CM | POA: Diagnosis not present

## 2017-12-09 DIAGNOSIS — K861 Other chronic pancreatitis: Secondary | ICD-10-CM | POA: Diagnosis not present

## 2017-12-09 DIAGNOSIS — M5431 Sciatica, right side: Secondary | ICD-10-CM | POA: Diagnosis not present

## 2017-12-09 DIAGNOSIS — R42 Dizziness and giddiness: Secondary | ICD-10-CM | POA: Diagnosis not present

## 2017-12-09 DIAGNOSIS — N183 Chronic kidney disease, stage 3 (moderate): Secondary | ICD-10-CM | POA: Diagnosis not present

## 2017-12-09 DIAGNOSIS — R262 Difficulty in walking, not elsewhere classified: Secondary | ICD-10-CM | POA: Diagnosis not present

## 2017-12-09 DIAGNOSIS — I129 Hypertensive chronic kidney disease with stage 1 through stage 4 chronic kidney disease, or unspecified chronic kidney disease: Secondary | ICD-10-CM | POA: Diagnosis not present

## 2017-12-09 DIAGNOSIS — D631 Anemia in chronic kidney disease: Secondary | ICD-10-CM | POA: Diagnosis not present

## 2017-12-09 DIAGNOSIS — M1990 Unspecified osteoarthritis, unspecified site: Secondary | ICD-10-CM | POA: Diagnosis not present

## 2017-12-11 ENCOUNTER — Telehealth: Payer: Self-pay | Admitting: Family Medicine

## 2017-12-11 NOTE — Telephone Encounter (Signed)
See note

## 2017-12-11 NOTE — Telephone Encounter (Signed)
Called patient she states that she is not sure when she was d/c off the meclizine or who did it. She states that she wants to go back on it because it helps with her hand sweating. She states that she is very embarrassed about it.  She wanted to know why she was taken off of it.  Informed that I will send message to you it will be Monday before we get response.

## 2017-12-11 NOTE — Telephone Encounter (Signed)
Copied from [place] [id number]. Topic: Quick Communication - See Telephone Encounter >> Dec 11, 2017  2:59 PM Antonieta Iba C wrote: CRM for notification. See Telephone encounter for: pt called in. Pt says that she use to take Meclizine, it helps with her sweaty hands. Pt says that she was discontinued off of medication but would like to start back taking it, she feels that it helps her. Pt is requesting a calling back  CB: [phone]   12/11/17.

## 2017-12-12 NOTE — Telephone Encounter (Signed)
He does not look like this patient has been on meclizine for quite some time.  Clarify this for me.  Meclizine is a high risk medication for her age group.  It is an antihistamine and can cause dizziness.  Will okay referral to dermatology for her to be evaluated for hyperhidrosis and treatment that is appropriate for her.  Okay to put in referral.

## 2017-12-14 ENCOUNTER — Encounter: Payer: Medicare HMO | Attending: Endocrinology | Admitting: Nutrition

## 2017-12-14 DIAGNOSIS — Z713 Dietary counseling and surveillance: Secondary | ICD-10-CM | POA: Insufficient documentation

## 2017-12-14 DIAGNOSIS — Z794 Long term (current) use of insulin: Secondary | ICD-10-CM | POA: Insufficient documentation

## 2017-12-14 DIAGNOSIS — E1165 Type 2 diabetes mellitus with hyperglycemia: Secondary | ICD-10-CM

## 2017-12-14 NOTE — Telephone Encounter (Signed)
Pt notified of notes per Dr. Juleen China on 12/12/16. Pt states that she does not want a referral because that would be "a waste of time". Pt states she was originally prescribed the Meclizine for dizziness, but it also helps with her sweaty hands. Pt states it is the only thing that helps and states she is miserable without taking the medication. Pt also complaining that she has started to have episodes of dizziness again since she has stopped taking Meclizine. Pt states the most recent episode was a couple of days ago.

## 2017-12-14 NOTE — Telephone Encounter (Signed)
Left message to return call to our office.  CRM Started ok to give message.   

## 2017-12-15 ENCOUNTER — Telehealth: Payer: Self-pay | Admitting: Nutrition

## 2017-12-15 NOTE — Telephone Encounter (Signed)
Please advise 

## 2017-12-15 NOTE — Telephone Encounter (Signed)
Spoke with patient and advised of the message below. Patient stated that she has been to ENT and they do not help her. Patient stated that she would pick up OTC Meclizine.

## 2017-12-15 NOTE — Telephone Encounter (Signed)
°  Relation to pt: self Call back number:[phone] (M)    Reason for call:  Patient checking on the status of message below, please advise

## 2017-12-15 NOTE — Progress Notes (Signed)
Current insulin dose:  Tresiba: 6u acB and acS, and 10u at HS.  Pt. Very confused about this dose, giving me several different answers.  I will call tomorrow to reconfirm that she is doing this. Novolog: 4u ac B and S.   Typical day: 5-7:00 Up taking Tresibal and Novolog.  7: 15 eating breakfast: 1 carton of yogurt with added blueberries, tuna fish and 4 crackers.  Coffee to drink with little milk 11:30: sandwich of meat and cheese, using 1/4 piece to bread and salad with water to drink 5:30: soup, salad, piece of meat 3-5 ounces, 1 starchy veg., and 2 non starchy veg., water to drink 8PM: snack: variable in the amount of carb:10-30g.  Pt. Was told that she needed to eat 3 meals and 3 snacks.  And she was doing this, adding some fruit/or cheese and crackers between meals. She says she has gained 3-4 pounds, and she does not want this.  Exercise: gym 2-3X/wk.for 45 min to 1 hour,  Meter download shows that patient is testing 2.2X/day with 58% above range and 40% within range  FBSs: 89-149, acS: 123-208 with one low of 64.  AcS: 162-303, HS: 173-282 (one at 153)  Plan: 1.Stop snacking between meals.  Discussed HS snacking, and limiting this to no more than 15 grams of carb.  Suggestions given for this. 2. Will call her tomorrow to reconfirm that she is giving [name] TID, and Novolog only BID.

## 2017-12-15 NOTE — Telephone Encounter (Signed)
Patient confirmed that she is taking [name]: 6u acB, 6u acS, and 12u HS.  Novolog: 4u acB and acS She was reminded to call me next Monday with blood sugar readings without all the between meal snacking she was told to do.

## 2017-12-15 NOTE — Patient Instructions (Signed)
Stop all snacking. Limit HS snacking to no more than 100 calories

## 2017-12-15 NOTE — Telephone Encounter (Signed)
The patient can choose to take Meclizine is she wishes as it is over the counter. I am not prescribing it for the hyperhidrosis in this high-risk patient. Referral is still offered. ENT offered as well.

## 2017-12-21 NOTE — Telephone Encounter (Signed)
Pt. Reports that blood sugars are much better:  FBSs: 98 today, 100, 92, 104, 106.   ACS: 83 ac last ngiht, 100, 92, 104.  Pt. Reports that she is having swelling in her feet and hands.  Told her she will need to call her family doctor about this, and that she should do this ASAP.  She says no SOB.   She had no final questions.

## 2017-12-22 NOTE — Telephone Encounter (Signed)
See phone message on 12/21/08

## 2017-12-30 ENCOUNTER — Ambulatory Visit: Payer: Medicare HMO | Admitting: Endocrinology

## 2017-12-30 ENCOUNTER — Encounter: Payer: Self-pay | Admitting: Endocrinology

## 2017-12-30 ENCOUNTER — Telehealth: Payer: Self-pay

## 2017-12-30 VITALS — BP 128/68 | HR 69 | Ht 59.0 in | Wt 118.2 lb

## 2017-12-30 DIAGNOSIS — Z794 Long term (current) use of insulin: Secondary | ICD-10-CM

## 2017-12-30 DIAGNOSIS — E1165 Type 2 diabetes mellitus with hyperglycemia: Secondary | ICD-10-CM | POA: Diagnosis not present

## 2017-12-30 DIAGNOSIS — E1142 Type 2 diabetes mellitus with diabetic polyneuropathy: Secondary | ICD-10-CM | POA: Diagnosis not present

## 2017-12-30 LAB — POCT GLYCOSYLATED HEMOGLOBIN (HGB A1C): Hemoglobin A1C: 7.5

## 2017-12-30 NOTE — Patient Instructions (Addendum)
Tresiba 18 units daily at night,  Novolog 4 units at breakfast and 6 units supper  NO TRESIBA TONITE  Tyler Aas is only once daily  May take gabapentin 2x daily

## 2017-12-30 NOTE — Telephone Encounter (Signed)
Patient was given 4 Novolog flexpen's when she came in for her visit

## 2017-12-30 NOTE — Progress Notes (Signed)
Patient ID: Vanessa Gibson, female   DOB: 1928/03/20, 84 y.o.   MRN: [id number]           Reason for Appointment: Follow-up for Type 2 Diabetes  Referring physician: Drema Dallas   History of Present Illness:          Date of diagnosis of type 2 diabetes mellitus: 1997        Background history:   She had been treated with metformin mostly since her diagnosis At some point was given glipizide/metformin combination, detailed history is not available from PCP Also not clear what level of control she has had until recently  Recent history:   Non-insulin hypoglycemic drugs the patient is taking are: none   INSULIN dose: Tresiba 6 units twice daily and 10 units at night,  Novolog 4 units at breakfast and  supper  Her last A1c was better at 6.8, now gone up to 7.5   Current management, blood sugar patterns and problems identified:  She has despite instructions for taking her insulin and reviewing these by phone with the nurse educator she is now taking her Tyler Aas 3 times a day  She misunderstood and was previously supposed to be taking NovoLog 3 times a day  Although she was taking only about 12 units in the evening of her Tyler Aas previously she is taking a total of 22 units in divided doses now; she again claims that she was told to take it 3 times a day  Her lunchtime NovoLog was stopped after a trial because of her variable intake at lunchtime and possibly because of increased [name] her sugars in the afternoons came down significantly  HYPOGLYCEMIA has been documented minimally with only a reading of 68 last Saturday at 4:30 PM  Cannot explain by her blood sugars were higher in the afternoon about 2 weeks ago for some time and also her at night  She does however tend to have fairly consistently high readings at bedtime with average nearly 200  FASTING blood sugar recently are excellent with only sporadic high readings which she thinks can be from going off her diet the night  before  She is only taking 4 units of NovoLog, had been recommended 6 units previously  She has gained 4 pounds        Side effects from medications have been: ?  Nausea from metformin  Compliance with the medical regimen: Fairly good  Hypoglycemia: None    Glucose monitoring:  done 1-2 times a day         Glucometer:  Accu-Chek  Blood Glucose readings  by    Mean values apply above for all meters except median for One Touch  PRE-MEAL Fasting Lunch Dinner Bedtime Overall  Glucose range:    68-208  136-236   Mean/median:  110   142  198  143   POST-MEAL PC Breakfast PC Lunch PC Dinner  Glucose range:  ?   Mean/median:       Self-care: The diet that the patient has been following is: tries to limit sweets  She has her meals with assisted living facility     Typical meal intake: Breakfast is cereal, sometimes bacon.  At meals will get fried food at times    Lunch low carb           Dietician visit, most recent: Never CDE visit: 1/18               Exercise:  walking and calisthenics up to  30 minutes twice a week   Weight history: Previous range 98-140   Wt Readings from Last 3 Encounters:  12/30/17 118 lb 3.2 oz (53.6 kg)  11/20/17 114 lb 12.8 oz (52.1 kg)  11/13/17 114 lb (51.7 kg)    Glycemic control:  A1c in October 2017 = 8.5    Lab Results  Component Value Date   HGBA1C 7.5 12/30/2017   HGBA1C 6.8 09/21/2017   HGBA1C 8.9 (H) 03/30/2017   Lab Results  Component Value Date   MICROALBUR <0.7 11/14/2016   LDLCALC 90 02/02/2017   CREATININE 1.24 (H) 05/25/2017   Lab Results  Component Value Date   MICRALBCREAT 1.8 11/14/2016       Allergies as of 12/30/2017   No Known Allergies     Medication List        Accurate as of 12/30/17  8:40 PM. Always use your most recent med list.          acetaminophen 325 MG tablet Commonly known as:  TYLENOL Take 2 tablets (650 mg total) by mouth every 6 (six) hours as needed for moderate pain or headache.    aspirin EC 81 MG tablet Take 81 mg by mouth daily.   B-D SINGLE USE SWABS REGULAR Pads USE TO CLEAN FINGER BEFORE CHECKING BLOOD SUGARS AND CLEAN SKIN BEFORE INSULIN INJECTIONS.   BEANO PO Take by mouth as needed.   calcium carbonate 500 MG chewable tablet Commonly known as:  TUMS - dosed in mg elemental calcium Chew 1 tablet by mouth daily.   cefdinir 300 MG capsule Commonly known as:  OMNICEF Take 1 capsule (300 mg total) by mouth 2 (two) times daily.   esomeprazole 40 MG capsule Commonly known as:  NEXIUM Take 1 capsule (40 mg total) by mouth daily.   feeding supplement (GLUCERNA SHAKE) Liqd Take 237 mLs by mouth 3 (three) times daily between meals.   fluticasone 50 MCG/ACT nasal spray Commonly known as:  FLONASE Place 2 sprays into both nostrils daily.   gabapentin 100 MG capsule Commonly known as:  NEURONTIN Take 1 capsule (100 mg total) by mouth at bedtime.   glucose blood test strip Commonly known as:  ACCU-CHEK GUIDE Use to test blood sugar twice daily   insulin aspart 100 UNIT/ML FlexPen Commonly known as:  NOVOLOG FLEXPEN Inject 3 units before breakfast and 4 units before supper   insulin degludec 100 UNIT/ML Sopn FlexTouch Pen Commonly known as:  TRESIBA Inject 6 units into the skin once daily   Insulin Pen Needle 32G X 4 MM Misc Commonly known as:  BD PEN NEEDLE NANO U/F Use to inject insulin 4 times daily   isosorbide mononitrate 30 MG 24 hr tablet Commonly known as:  IMDUR Take 1 tablet (30 mg total) by mouth daily. One tablet daily   levocetirizine 5 MG tablet Commonly known as:  XYZAL Take 5 mg by mouth every evening.   lovastatin 20 MG tablet Commonly known as:  MEVACOR Take 20 mg by mouth at bedtime.   magnesium oxide 400 MG tablet Commonly known as:  MAG-OX Take 400 mg by mouth daily as needed.   metoprolol tartrate 25 MG tablet Commonly known as:  LOPRESSOR Take 1 tablet (25 mg total) by mouth 2 (two) times daily.    multivitamin-lutein Caps capsule Take 1 capsule by mouth daily.   HAIR/SKIN/NAILS/BIOTIN PO Take 1 tablet by mouth daily.   CENTRUM SILVER 50+WOMEN Tabs Take 1 tablet by mouth daily.   nitroGLYCERIN 0.3  MG SL tablet Commonly known as:  NITROSTAT Place 0.3 mg under the tongue every 5 (five) minutes as needed for chest pain.   polyethylene glycol packet Commonly known as:  MIRALAX / GLYCOLAX Take 17 g by mouth daily as needed.   PROBIOTIC FORMULA Caps Take 1 capsule by mouth daily.   Turmeric Curcumin 500 MG Caps Take 1 capsule by mouth daily.   ULTRA OMEGA-3 FISH OIL PO Take 1 capsule by mouth daily.   vitamin C 1000 MG tablet Take 1,000 mg by mouth daily.       Allergies: No Known Allergies  Past Medical History:  Diagnosis Date  . Anxiety   . Arthritis   . Breast cancer ([place]), bilateral   . Chronic pancreatitis ([place])   . Daily headache   . Depression   . Dizziness   . GERD (gastroesophageal reflux disease)   . Giddiness   . History of hiatal hernia   . Hyperlipemia   . Hypertension   . IBS (irritable bowel syndrome)   . Mandibular fracture ([place]) 12/04/2015  . MI (myocardial infarction) ([place])   . Skin cancer of face   . Type II diabetes mellitus ([place])     Past Surgical History:  Procedure Laterality Date  . BREAST SURGERY    . CATARACT EXTRACTION W/ INTRAOCULAR LENS  IMPLANT, BILATERAL Bilateral   . CHOLECYSTECTOMY OPEN  1970's  . MASTECTOMY Bilateral     No family history on file.  Social History:  reports that  has never smoked. she has never used smokeless tobacco. She reports that she does not drink alcohol or use drugs.   Review of Systems   Lipid history:  She is on lovastatin 20 mg from her PCP With the following results    Lab Results  Component Value Date   CHOL 163 02/02/2017   HDL 48.70 02/02/2017   LDLCALC 90 02/02/2017   TRIG 120.0 02/02/2017   CHOLHDL 3 02/02/2017           Hypertension: Has only minimal  hypertension She is on metoprolol 25 mg, Has history of CAD  HYPERKALEMIA: She has not had any recurrence of this   Lab Results  Component Value Date   CREATININE 1.24 (H) 05/25/2017   BUN 33 (H) 05/25/2017   NA 138 05/25/2017   K 4.9 05/25/2017   CL 104 05/25/2017   CO2 27 05/25/2017    Most recent eye exam was 07/2016, reportedly no retinopathy  Most recent foot exam: 11/05/17  She is complaining of some more tingling and discomfort in her hands and feet   Physical Examination:  BP 128/68 (BP Location: Right Arm, Patient Position: Sitting, Cuff Size: Normal)   Pulse 69   Ht 4\' 11"  (1.499 m)   Wt 118 lb 3.2 oz (53.6 kg)   SpO2 98%   BMI 23.87 kg/m        ASSESSMENT:  Diabetes type 2, uncontrolled, nonobese, insulin-requiring  See history of present illness for detailed discussion of current diabetes management, blood sugar patterns and problems identified   She is on basal bolus insulin regimen Although A1c has improved previously it is now up to 7.5 and not clear why it is higher  She was having higher readings in the afternoons and after supper until recently and now blood sugars are near normal before meals  Despite her being given written instructions she is by mistake taking to see about 3 times a day instead of once a day and  taking 10 more units than previously instructed However has not had any significant hypoglycemia with this even overnight  She has only taken 4 units of NovoLog instead of 6 units and with this her sugars are high at bedtime consistently   History of hyperkalemia:   needs follow-up, possibly related to renal handling of potassium  PLAN:   She will try to get her insulin from the indigent program Given very explicit instructions of how to take insulin and printed instruction reviewed Explained to her that Tyler Aas is only taken once a day Discussed the differences between basal and bolus insulin Discussed timing of taking her  insulin doses as well as blood sugar targets  She will increase her suppertime NovoLog to 6 units Meanwhile we will reduce her total Tresiba dose by 4 units and she will only take 18 units daily  Before her neuropathy symptoms she can take gabapentin twice a day instead of just at bedtime   Patient Instructions  Tresiba 18 units daily at night,  Novolog 4 units at breakfast and 6 units supper  NO TRESIBA TONITE  Tyler Aas is only once daily  May take gabapentin 2x daily     Counseling time on subjects discussed in assessment and plan sections is over 50% of today's 25 minute visit     Elayne Snare 12/30/2017, 8:40 PM   Note: This office note was prepared with Dragon voice recognition system technology. Any transcriptional errors that result from this process are unintentional.

## 2017-12-30 NOTE — Telephone Encounter (Signed)
See note below

## 2017-12-31 ENCOUNTER — Telehealth: Payer: Self-pay | Admitting: Endocrinology

## 2017-12-31 NOTE — Telephone Encounter (Signed)
Pt stated that dr changed her insulin and Her blood sugar levels are higher then she has ever seen before.  States the dr has changed her to one dose a day.  Pt states she is not feeling well also.   Please advise     insulin degludec (TRESIBA) 100 UNIT/ML SOPN FlexTouch Pen

## 2017-12-31 NOTE — Telephone Encounter (Signed)
Please find out what her blood sugars are and how much insulin she is taking.  Since we are making the change it will take 2-3 days for the blood sugars to come down but we do not want them to be low

## 2017-12-31 NOTE — Telephone Encounter (Signed)
Please advise 

## 2018-01-01 NOTE — Telephone Encounter (Signed)
She should not worry about her blood sugars at this time, if her sugars are continuing to be over 200 after supper she can increase her NovoLog to 8 units instead of 6

## 2018-01-01 NOTE — Telephone Encounter (Signed)
Called patient and she stated that her blood sugar yesterday morning was 152 and last night was 156. On Wednesday morning it was 167 and Wednesday night it was 272 and she stated that she did not take her Tyler Aas until Thursday night.  Daryan stated that she started the 18 units of Tresiba last night and she is currently taking 4 units of Novolog and 6 units at supper of the Novolog.  I advised to her that it will take 2-3 days to adjust.  Please advsie.

## 2018-01-01 NOTE — Telephone Encounter (Signed)
Pt is aware.  

## 2018-01-04 ENCOUNTER — Ambulatory Visit (INDEPENDENT_AMBULATORY_CARE_PROVIDER_SITE_OTHER): Payer: Medicare HMO

## 2018-01-04 ENCOUNTER — Ambulatory Visit (INDEPENDENT_AMBULATORY_CARE_PROVIDER_SITE_OTHER): Payer: Medicare HMO | Admitting: Family Medicine

## 2018-01-04 ENCOUNTER — Encounter: Payer: Self-pay | Admitting: Family Medicine

## 2018-01-04 VITALS — BP 144/66 | HR 99 | Temp 99.1°F | Wt 117.4 lb

## 2018-01-04 DIAGNOSIS — R05 Cough: Secondary | ICD-10-CM

## 2018-01-04 DIAGNOSIS — R059 Cough, unspecified: Secondary | ICD-10-CM

## 2018-01-04 MED ORDER — CEFDINIR 300 MG PO CAPS: 300.0000 mg | ORAL_CAPSULE | Freq: Two times a day (BID) | ORAL | 0 refills | Status: DC

## 2018-01-04 MED ORDER — GUAIFENESIN-CODEINE 100-10 MG/5ML PO SYRP: 10.0000 mL | ORAL_SOLUTION | Freq: Every evening | ORAL | 0 refills | Status: DC | PRN

## 2018-01-04 MED ORDER — BUDESONIDE-FORMOTEROL FUMARATE 160-4.5 MCG/ACT IN AERO: 2.0000 | INHALATION_SPRAY | Freq: Two times a day (BID) | RESPIRATORY_TRACT | 3 refills | Status: DC

## 2018-01-04 NOTE — Telephone Encounter (Signed)
She can temporarily increase her Tresiba up to 20 units and her NovoLog by 2 units at each meal

## 2018-01-04 NOTE — Telephone Encounter (Signed)
Pt stated that her sugars are still running high. She states she has bronchitis and that are numbers are between 208-249.   Please advise

## 2018-01-04 NOTE — Progress Notes (Signed)
Vanessa Gibson is a 84 y.o. female here for an acute visit.  History of Present Illness:   [name], CMA acting as scribe for Dr. Briscoe Deutscher.   Cough  This is a recurrent problem. The current episode started more than 1 month ago. The problem has been gradually improving. The problem occurs every few minutes. Associated symptoms include chest pain, ear pain and a fever. Pertinent negatives include no headaches or nasal congestion. The treatment provided no relief.  Back Pain  This is a new problem. The current episode started in the past 7 days. The problem occurs intermittently. The problem has been gradually worsening since onset. The pain is at a severity of 7/10. The symptoms are aggravated by coughing. Associated symptoms include chest pain and a fever. Pertinent negatives include no headaches. The treatment provided no relief.   Patient states that all symptoms started after fan was replaced in apartment. She was up set that she was charged for room cleaning when they are the reason for it.   PMHx, SurgHx, SocialHx, Medications, and Allergies were reviewed in the Visit Navigator and updated as appropriate.  Current Medications:   .  acetaminophen (TYLENOL) 325 MG tablet, Take 2 tablets (650 mg total) by mouth every 6 (six) hours as needed for moderate pain or headache., Disp: , Rfl:  .  Alcohol Swabs (B-D SINGLE USE SWABS REGULAR) PADS, USE TO CLEAN FINGER BEFORE CHECKING BLOOD SUGARS AND CLEAN SKIN BEFORE INSULIN INJECTIONS., Disp: 300 each, Rfl: 2 .  Alpha-D-Galactosidase (BEANO PO), Take by mouth as needed., Disp: , Rfl:  .  Ascorbic Acid (VITAMIN C) 1000 MG tablet, Take 1,000 mg by mouth daily., Disp: , Rfl:  .  aspirin EC 81 MG tablet, Take 81 mg by mouth daily., Disp: , Rfl:  .  calcium carbonate (TUMS - DOSED IN MG ELEMENTAL CALCIUM) 500 MG chewable tablet, Chew 1 tablet by mouth daily., Disp: , Rfl:  .  esomeprazole (NEXIUM) 40 MG capsule, Take 1 capsule (40 mg total)  by mouth daily., Disp: 30 capsule, Rfl: 0 .  feeding supplement, GLUCERNA SHAKE, (GLUCERNA SHAKE) LIQD, Take 237 mLs by mouth 3 (three) times daily between meals., Disp: , Rfl: 0 .  fluticasone (FLONASE) 50 MCG/ACT nasal spray, Place 2 sprays into both nostrils daily., Disp: 16 g, Rfl: 0 .  gabapentin (NEURONTIN) 100 MG capsule, Take 1 capsule (100 mg total) by mouth at bedtime., Disp: 30 capsule, Rfl: 3 .  glucose blood (ACCU-CHEK GUIDE) test strip, Use to test blood sugar twice daily, Disp: 100 each, Rfl: 2 .  insulin aspart (NOVOLOG FLEXPEN) 100 UNIT/ML FlexPen, Inject 3 units before breakfast and 4 units before supper (Patient taking differently: Inject 6 units before breakfast and 12 units before supper), Disp: 5 pen, Rfl: 4 .  insulin degludec (TRESIBA) 100 UNIT/ML SOPN FlexTouch Pen, Inject 6 units into the skin once daily (Patient taking differently: 10 Units. Inject 8 units into the skin once daily), Disp: 18 pen, Rfl: 1 .  Insulin Pen Needle (BD PEN NEEDLE NANO U/F) 32G X 4 MM MISC, Use to inject insulin 4 times daily, Disp: 100 each, Rfl: 2 .  isosorbide mononitrate (IMDUR) 30 MG 24 hr tablet, Take 1 tablet (30 mg total) by mouth daily. One tablet daily, Disp: 30 tablet, Rfl: 0 .  levocetirizine (XYZAL) 5 MG tablet, Take 5 mg by mouth every evening., Disp: , Rfl:  .  lovastatin (MEVACOR) 20 MG tablet, Take 20 mg by mouth at  bedtime., Disp: , Rfl:  .  magnesium oxide (MAG-OX) 400 MG tablet, Take 400 mg by mouth daily as needed., Disp: , Rfl:  .  metoprolol tartrate (LOPRESSOR) 25 MG tablet, Take 1 tablet (25 mg total) by mouth 2 (two) times daily., Disp: 180 tablet, Rfl: 2 .  Multiple Vitamins-Minerals (CENTRUM SILVER 50+WOMEN) TABS, Take 1 tablet by mouth daily., Disp: , Rfl:  .  Multiple Vitamins-Minerals (HAIR/SKIN/NAILS/BIOTIN PO), Take 1 tablet by mouth daily., Disp: , Rfl:  .  multivitamin-lutein (OCUVITE-LUTEIN) CAPS capsule, Take 1 capsule by mouth daily., Disp: , Rfl:  .   nitroGLYCERIN (NITROSTAT) 0.3 MG SL tablet, Place 0.3 mg under the tongue every 5 (five) minutes as needed for chest pain., Disp: , Rfl:  .  Omega-3 Fatty Acids (ULTRA OMEGA-3 FISH OIL PO), Take 1 capsule by mouth daily., Disp: , Rfl:  .  polyethylene glycol (MIRALAX / GLYCOLAX) packet, Take 17 g by mouth daily as needed., Disp: , Rfl:  .  Probiotic Product (PROBIOTIC FORMULA) CAPS, Take 1 capsule by mouth daily., Disp: , Rfl:  .  Turmeric Curcumin 500 MG CAPS, Take 1 capsule by mouth daily., Disp: , Rfl:    No Known Allergies   Review of Systems:   Pertinent items are noted in the HPI. Otherwise, ROS is negative.  Vitals:   Vitals:   01/04/18 1400  BP: (!) 144/66  Pulse: 99  Temp: 99.1 F (37.3 C)  TempSrc: Oral  SpO2: 97%  Weight: 117 lb 6.4 oz (53.3 kg)     Body mass index is 23.71 kg/m.  Physical Exam:   Physical Exam  Constitutional: She appears well-nourished.  HENT:  Head: Normocephalic and atraumatic.  Nose: Mucosal edema and rhinorrhea present.  Eyes: EOM are normal. Pupils are equal, round, and reactive to light.  Neck: Normal range of motion. Neck supple.  Cardiovascular: Normal rate, regular rhythm, normal heart sounds and intact distal pulses.  Pulmonary/Chest: Effort normal. She has rhonchi.  Cough.  Abdominal: Soft.  Skin: Skin is warm.  Psychiatric: She has a normal mood and affect. Her behavior is normal.  Nursing note and vitals reviewed.   EXAM: CHEST  2 VIEW  COMPARISON:  12/11/2015 chest x-ray.  11/11/2016 chest CT.  FINDINGS: No infiltrate, congestive heart failure or pneumothorax.  No plain film evidence of pulmonary malignancy.  Heart size within normal limits. Prominent right epicardial fat pad. Coronary artery calcifications.  Calcified slightly tortuous aorta.  Scoliosis lower thoracic and lumbar spine with superimposed degenerative changes.  IMPRESSION: No acute pulmonary abnormality.  Aortic Atherosclerosis  (ICD10-I70.0).  Assessment and Plan:   1. Cough, Bronchitis - DG Chest 2 View as above. - cefdinir (OMNICEF) 300 MG capsule; Take 1 capsule (300 mg total) by mouth 2 (two) times daily.  Dispense: 20 capsule; Refill: 0 - guaiFENesin-codeine (CHERATUSSIN AC) 100-10 MG/5ML syrup; Take 10 mLs by mouth at bedtime as needed for cough or congestion.  Dispense: 120 mL; Refill: 0 - budesonide-formoterol (SYMBICORT) 160-4.5 MCG/ACT inhaler; Inhale 2 puffs into the lungs 2 (two) times daily.  Dispense: 1 Inhaler; Refill: 3   . Reviewed expectations re: course of current medical issues. . Discussed self-management of symptoms. . Outlined signs and symptoms indicating need for more acute intervention. . Patient verbalized understanding and all questions were answered. Marland Kitchen Health Maintenance issues including appropriate healthy diet, exercise, and smoking avoidance were discussed with patient. . See orders for this visit as documented in the electronic medical record. . Patient received an After  Visit Summary.  CMA served as Education administrator during this visit. History, Physical, and Plan performed by medical provider. The above documentation has been reviewed and is accurate and complete. Briscoe Deutscher, D.O.  Briscoe Deutscher, DO [place] 01/05/2018

## 2018-01-04 NOTE — Telephone Encounter (Signed)
Called patient and gave her instructions to increase her Tresiba to 20 units at night and her Novolog to 6 units at breakfast and 8 units at supper.

## 2018-01-04 NOTE — Telephone Encounter (Signed)
Please advise 

## 2018-01-07 ENCOUNTER — Telehealth: Payer: Self-pay | Admitting: Family Medicine

## 2018-01-07 NOTE — Telephone Encounter (Signed)
Copied from [place] [id number]. Topic: Quick Communication - See Telephone Encounter >> Jan 07, 2018  2:23 PM Ether Griffins B wrote: CRM for notification. See Telephone encounter for:  Pt was seen 01/04/18. She is calling in due to still coughing and now coughing up green phlem. She is only taking the codine at bedtime and she is wondering what else can be done. Every time she coughs she states its deep and hurts. Also her sugar has been elevated today it was 134 when she checked it.  01/07/18.

## 2018-01-07 NOTE — Telephone Encounter (Signed)
Please advise 

## 2018-01-10 NOTE — Telephone Encounter (Signed)
Please check in with patient. Okay to call in Tessalon pearls.

## 2018-01-11 ENCOUNTER — Other Ambulatory Visit: Payer: Self-pay | Admitting: Endocrinology

## 2018-01-11 MED ORDER — BENZONATATE 100 MG PO CAPS: 100.0000 mg | ORAL_CAPSULE | Freq: Three times a day (TID) | ORAL | 0 refills | Status: DC | PRN

## 2018-01-11 NOTE — Telephone Encounter (Signed)
Spoke to patient meds sent in to pharmacy will call if any problems.

## 2018-01-14 ENCOUNTER — Ambulatory Visit: Payer: Self-pay | Admitting: *Deleted

## 2018-01-14 NOTE — Telephone Encounter (Signed)
See note

## 2018-01-14 NOTE — Telephone Encounter (Signed)
  Reason for Disposition . [1] Blood glucose > 240 mg/dl (13 mmol/l) AND [2] uses insulin (e.g., insulin-dependent, all people with type 1 diabetes)  Protocols used: DIABETES - HIGH BLOOD SUGAR-A-AH

## 2018-01-14 NOTE — Telephone Encounter (Addendum)
Patient is calling with multiple complaints- she has Bronchitis that she has had for over a month. She is slowly improving - but not quick enough. She is still sick and feels miserable. She is currently taking codeine for cough and is about to finish antibiotic. She also reports that her glucose levels are elevated and she does not know why. She states her normal ranges are 120-150 and recently she has been getting readings of 203 fasting and presently 293. She states she was told to take 8 units of Nova log if her glucose went over 200- and she has not done that yet. Encouraged patient to go ahead and do that. She lives at [facility] and she does not have access to transportation short notice. I contacted her daughter to make her an appointment for follow up in the office. I made her an appointment at 1:00 tomorrow. I contacted the patient and let her know- I had her recheck her glucose level and it has decreased to 247. She is to monitor it throughout the evening and call back if it does not decrease appropriately. She states understanding.  Answer Assessment - Initial Assessment Questions 1. ONSET: "When did the cough begin?"      1 1/2 months 2. SEVERITY: "How bad is the cough today?"      Patient is coughing up green congestion 3. RESPIRATORY DISTRESS: "Describe your breathing."      Patient is using humidifier- patient is still sick 4. FEVER: "Do you have a fever?" If so, ask: "What is your temperature, how was it measured, and when did it start?"     no 5. SPUTUM: "Describe the color of your sputum" (clear, white, yellow, green)     Green- decrease in amount 6. HEMOPTYSIS: "Are you coughing up any blood?" If so ask: "How much?" (flecks, streaks, tablespoons, etc.)     no 7. CARDIAC HISTORY: "Do you have any history of heart disease?" (e.g., heart attack, congestive heart failure)      Heart attack history 8. LUNG HISTORY: "Do you have any history of lung disease?"  (e.g., pulmonary  embolus, asthma, emphysema)     no 9. PE RISK FACTORS: "Do you have a history of blood clots?" (or: recent major surgery, recent prolonged travel, bedridden )     no 10. OTHER SYMPTOMS: "Do you have any other symptoms?" (e.g., runny nose, wheezing, chest pain)       Patient states she inhaled dust and that is what started it. 11. PREGNANCY: "Is there any chance you are pregnant?" "When was your last menstrual period?"       n/a 12. TRAVEL: "Have you traveled out of the country in the last month?" (e.g., travel history, exposures)       n/a  Answer Assessment - Initial Assessment Questions 1. BLOOD GLUCOSE: "What is your blood glucose level?"      294  2. ONSET: "When did you check the blood glucose?"     3:00 3. USUAL RANGE: "What is your glucose level usually?" (e.g., usual fasting morning value, usual evening value)     130-182 4. KETONES: "Do you check for ketones (urine or blood test strips)?" If yes, ask: "What does the test show now?"      no 5. TYPE 1 or 2:  "Do you know what type of diabetes you have?"  (e.g., Type 1, Type 2, Gestational; doesn't know)      Type 2 6. INSULIN: "Do you take insulin?" If yes,  ask: "Have you missed any shots recently?"     Yes - no 7. DIABETES PILLS: "Do you take any pills for your diabetes?" If yes, ask: "Have you missed taking any pills recently?"     no 8. OTHER SYMPTOMS: "Do you have any symptoms?" (e.g., fever, frequent urination, difficulty breathing, dizziness, weakness, vomiting)     Urination has slowed- clear 9. PREGNANCY: "Is there any chance you are pregnant?" "When was your last menstrual period?"     n/a  Protocols used: COUGH - ACUTE PRODUCTIVE-A-AH, DIABETES - HIGH BLOOD SUGAR-A-AH

## 2018-01-15 ENCOUNTER — Ambulatory Visit (INDEPENDENT_AMBULATORY_CARE_PROVIDER_SITE_OTHER): Payer: Medicare HMO | Admitting: Physician Assistant

## 2018-01-15 ENCOUNTER — Encounter: Payer: Self-pay | Admitting: Physician Assistant

## 2018-01-15 ENCOUNTER — Ambulatory Visit: Payer: Self-pay | Admitting: *Deleted

## 2018-01-15 VITALS — BP 102/60 | HR 74 | Temp 98.3°F | Ht 59.0 in | Wt 114.5 lb

## 2018-01-15 DIAGNOSIS — R059 Cough, unspecified: Secondary | ICD-10-CM

## 2018-01-15 DIAGNOSIS — H6121 Impacted cerumen, right ear: Secondary | ICD-10-CM

## 2018-01-15 DIAGNOSIS — H6122 Impacted cerumen, left ear: Secondary | ICD-10-CM

## 2018-01-15 DIAGNOSIS — I10 Essential (primary) hypertension: Secondary | ICD-10-CM

## 2018-01-15 DIAGNOSIS — E1165 Type 2 diabetes mellitus with hyperglycemia: Secondary | ICD-10-CM | POA: Diagnosis not present

## 2018-01-15 DIAGNOSIS — Z794 Long term (current) use of insulin: Secondary | ICD-10-CM

## 2018-01-15 DIAGNOSIS — R05 Cough: Secondary | ICD-10-CM

## 2018-01-15 MED ORDER — AEROCHAMBER PLUS FLO-VU SMALL MISC: 1.0000 | Freq: Once | 0 refills | Status: AC

## 2018-01-15 MED ORDER — IPRATROPIUM BROMIDE 0.03 % NA SOLN: 2.0000 | Freq: Two times a day (BID) | NASAL | 12 refills | Status: DC

## 2018-01-15 MED ORDER — ALBUTEROL SULFATE HFA 108 (90 BASE) MCG/ACT IN AERS: 2.0000 | INHALATION_SPRAY | Freq: Four times a day (QID) | RESPIRATORY_TRACT | 2 refills | Status: DC | PRN

## 2018-01-15 NOTE — Progress Notes (Signed)
Vanessa Gibson is a 84 y.o. female here for a follow up of a pre-existing problem.  I acted as a Education administrator for [employer], PA-C Anselmo Pickler, LPN  History of Present Illness:   Chief Complaint  Patient presents with  . Diabetes  . Cough    Cough  This is a chronic problem. The current episode started 1 to 4 weeks ago. The problem has been gradually improving. The cough is productive of purulent sputum. Associated symptoms include nasal congestion, shortness of breath and wheezing. Pertinent negatives include no fever or sore throat. Associated symptoms comments: Dizzy . The symptoms are aggravated by lying down. She has tried prescription cough suppressant for the symptoms. The treatment provided mild relief.   Cough for at least 2 months. Sputum is thick. No medications OTC. Using the inhaler twice a day. Using dehumidifier well. Cough is all during the day. Urine is clear. She states that she feels like she is pushing fluids. She does have a little dizziness today. Her blood pressure is lower than normal today. She is somewhat of a poor historian, but states that she thinks that she is taking all of her medications as prescribed. She does report that the inhaler is difficult to use so doesn't think that she's using it correctly.  Additionally, when she called and made her appointment yesterday for her cough, she also noticed that her blood sugars were elevated around 203 - 293. Blood sugars currently 144, and she states that she is no longer concerned with that at this time. She states that she is taking her medications as prescribed.  CXR was performed at last visit on 01/04/18 and was without acute pulmonary abnormalities.  BP Readings from Last 3 Encounters:  01/15/18 102/60  01/04/18 (!) 144/66  12/30/17 128/68     Past Medical History:  Diagnosis Date  . Anxiety   . Arthritis   . Breast cancer ([place]), bilateral   . Chronic pancreatitis ([place])   . Daily headache   . Depression    . Dizziness   . GERD (gastroesophageal reflux disease)   . Giddiness   . History of hiatal hernia   . Hyperlipemia   . Hypertension   . IBS (irritable bowel syndrome)   . Mandibular fracture ([place]) 12/04/2015  . MI (myocardial infarction) ([place])   . Skin cancer of face      Social History   Socioeconomic History  . Marital status: Divorced    Spouse name: Not on file  . Number of children: Not on file  . Years of education: Not on file  . Highest education level: Not on file  Social Needs  . Financial resource strain: Not on file  . Food insecurity - worry: Not on file  . Food insecurity - inability: Not on file  . Transportation needs - medical: Not on file  . Transportation needs - non-medical: Not on file  Occupational History  . Not on file  Tobacco Use  . Smoking status: Never Smoker  . Smokeless tobacco: Never Used  Substance and Sexual Activity  . Alcohol use: No  . Drug use: No  . Sexual activity: No  Other Topics Concern  . Not on file  Social History Narrative   Lives at [facility], [place].     Past Surgical History:  Procedure Laterality Date  . BREAST SURGERY    . CATARACT EXTRACTION W/ INTRAOCULAR LENS  IMPLANT, BILATERAL Bilateral   . CHOLECYSTECTOMY OPEN  1970's  .  MASTECTOMY Bilateral     No family history on file.  No Known Allergies  Current Medications:   Current Outpatient Medications:  .  acetaminophen (TYLENOL) 325 MG tablet, Take 2 tablets (650 mg total) by mouth every 6 (six) hours as needed for moderate pain or headache., Disp: , Rfl:  .  Alcohol Swabs (B-D SINGLE USE SWABS REGULAR) PADS, USE TO CLEAN FINGER BEFORE CHECKING BLOOD SUGARS AND CLEAN SKIN BEFORE INSULIN INJECTIONS., Disp: 300 each, Rfl: 2 .  Alpha-D-Galactosidase (BEANO PO), Take by mouth as needed., Disp: , Rfl:  .  Ascorbic Acid (VITAMIN C) 1000 MG tablet, Take 1,000 mg by mouth daily., Disp: , Rfl:  .  aspirin EC 81 MG tablet, Take 81 mg by mouth  daily., Disp: , Rfl:  .  benzonatate (TESSALON PERLES) 100 MG capsule, Take 1 capsule (100 mg total) by mouth 3 (three) times daily as needed for cough., Disp: 30 capsule, Rfl: 0 .  budesonide-formoterol (SYMBICORT) 160-4.5 MCG/ACT inhaler, Inhale 2 puffs into the lungs 2 (two) times daily., Disp: 1 Inhaler, Rfl: 3 .  calcium carbonate (TUMS - DOSED IN MG ELEMENTAL CALCIUM) 500 MG chewable tablet, Chew 1 tablet by mouth daily., Disp: , Rfl:  .  cefdinir (OMNICEF) 300 MG capsule, Take 1 capsule (300 mg total) by mouth 2 (two) times daily., Disp: 20 capsule, Rfl: 0 .  esomeprazole (NEXIUM) 40 MG capsule, Take 1 capsule (40 mg total) by mouth daily., Disp: 30 capsule, Rfl: 0 .  feeding supplement, GLUCERNA SHAKE, (GLUCERNA SHAKE) LIQD, Take 237 mLs by mouth 3 (three) times daily between meals., Disp: , Rfl: 0 .  fluticasone (FLONASE) 50 MCG/ACT nasal spray, Place 2 sprays into both nostrils daily., Disp: 16 g, Rfl: 0 .  gabapentin (NEURONTIN) 100 MG capsule, Take 1 capsule (100 mg total) by mouth at bedtime., Disp: 30 capsule, Rfl: 3 .  glucose blood (ACCU-CHEK GUIDE) test strip, Use to test blood sugar twice daily, Disp: 100 each, Rfl: 2 .  guaiFENesin-codeine (CHERATUSSIN AC) 100-10 MG/5ML syrup, Take 10 mLs by mouth at bedtime as needed for cough or congestion., Disp: 120 mL, Rfl: 0 .  insulin aspart (NOVOLOG FLEXPEN) 100 UNIT/ML FlexPen, Inject 3 units before breakfast and 4 units before supper (Patient taking differently: Inject 6 units before breakfast and 12 units before supper), Disp: 5 pen, Rfl: 4 .  insulin degludec (TRESIBA) 100 UNIT/ML SOPN FlexTouch Pen, Inject 6 units into the skin once daily (Patient taking differently: 10 Units. Inject 8 units into the skin once daily), Disp: 18 pen, Rfl: 1 .  Insulin Pen Needle (BD PEN NEEDLE NANO U/F) 32G X 4 MM MISC, USE TO INJECT INSULINS 4 TIMES DAILY., Disp: 100 each, Rfl: 0 .  isosorbide mononitrate (IMDUR) 30 MG 24 hr tablet, Take 1 tablet (30 mg  total) by mouth daily. One tablet daily, Disp: 30 tablet, Rfl: 0 .  levocetirizine (XYZAL) 5 MG tablet, Take 5 mg by mouth every evening., Disp: , Rfl:  .  lovastatin (MEVACOR) 20 MG tablet, Take 20 mg by mouth at bedtime., Disp: , Rfl:  .  magnesium oxide (MAG-OX) 400 MG tablet, Take 400 mg by mouth daily as needed., Disp: , Rfl:  .  metoprolol tartrate (LOPRESSOR) 25 MG tablet, Take 1 tablet (25 mg total) by mouth 2 (two) times daily., Disp: 180 tablet, Rfl: 2 .  Multiple Vitamins-Minerals (CENTRUM SILVER 50+WOMEN) TABS, Take 1 tablet by mouth daily., Disp: , Rfl:  .  Multiple Vitamins-Minerals (HAIR/SKIN/NAILS/BIOTIN PO),  Take 1 tablet by mouth daily., Disp: , Rfl:  .  multivitamin-lutein (OCUVITE-LUTEIN) CAPS capsule, Take 1 capsule by mouth daily., Disp: , Rfl:  .  nitroGLYCERIN (NITROSTAT) 0.3 MG SL tablet, Place 0.3 mg under the tongue every 5 (five) minutes as needed for chest pain., Disp: , Rfl:  .  Omega-3 Fatty Acids (ULTRA OMEGA-3 FISH OIL PO), Take 1 capsule by mouth daily., Disp: , Rfl:  .  polyethylene glycol (MIRALAX / GLYCOLAX) packet, Take 17 g by mouth daily as needed., Disp: , Rfl:  .  Probiotic Product (PROBIOTIC FORMULA) CAPS, Take 1 capsule by mouth daily., Disp: , Rfl:  .  TRUEPLUS LANCETS 28G MISC, CHECK BLOOD SUGAR ONCE A DAY., Disp: 100 each, Rfl: 0 .  Turmeric Curcumin 500 MG CAPS, Take 1 capsule by mouth daily., Disp: , Rfl:  .  albuterol (PROVENTIL HFA;VENTOLIN HFA) 108 (90 Base) MCG/ACT inhaler, Inhale 2 puffs into the lungs every 6 (six) hours as needed for wheezing or shortness of breath., Disp: 1 Inhaler, Rfl: 2 .  ipratropium (ATROVENT) 0.03 % nasal spray, Place 2 sprays into both nostrils every 12 (twelve) hours., Disp: 30 mL, Rfl: 12 .  Spacer/Aero-Holding Chambers (AEROCHAMBER PLUS FLO-VU SMALL) MISC, 1 each by Other route once for 1 dose., Disp: 1 each, Rfl: 0   Review of Systems:   Review of Systems  Constitutional: Negative for fever.  HENT: Negative for  sore throat.   Respiratory: Positive for cough, shortness of breath and wheezing.   Neurological: Positive for dizziness.    Vitals:   Vitals:   01/15/18 1304  BP: 102/60  Pulse: 74  Temp: 98.3 F (36.8 C)  TempSrc: Oral  SpO2: 95%  Weight: 114 lb 8 oz (51.9 kg)  Height: 4\' 11"  (1.499 m)     Body mass index is 23.13 kg/m.  Physical Exam:   Physical Exam  Constitutional: She appears well-developed. She is cooperative.  Non-toxic appearance. She does not have a sickly appearance. She does not appear ill. No distress.  HENT:  Head: Normocephalic and atraumatic.  Right Ear: External ear and ear canal normal.  Left Ear: Tympanic membrane, external ear and ear canal normal. Tympanic membrane is not erythematous, not retracted and not bulging.  Nose: Nose normal. Right sinus exhibits no maxillary sinus tenderness and no frontal sinus tenderness. Left sinus exhibits no maxillary sinus tenderness and no frontal sinus tenderness.  Mouth/Throat: Uvula is midline. No posterior oropharyngeal edema or posterior oropharyngeal erythema.  Prior to ear lavage, unable to visualize TM. After ear lavage, TM seen and is intact without erythema. She reports improvement of hearing with the ear lavage.  Eyes: Conjunctivae and lids are normal.  Neck: Trachea normal.  Cardiovascular: Normal rate, regular rhythm, S1 normal, S2 normal and normal heart sounds.  Pulmonary/Chest: Effort normal and breath sounds normal. She has no decreased breath sounds. She has no wheezes. She has no rhonchi. She has no rales.  Lymphadenopathy:    She has no cervical adenopathy.  Neurological: She is alert.  Skin: Skin is warm, dry and intact.  Psychiatric: She has a normal mood and affect. Her speech is normal and behavior is normal.  Nursing note and vitals reviewed.  Procedure: Cerumen Disimpaction Gentle ear lavage performed on the right ear. There were no complications and following the disimpaction the tympanic  membrane was visible on the right. Tympanic membranes are intact following the procedure.  Auditory canals are normal.  The patient reported relief of symptoms after  removal of cerumen.   Assessment and Plan:    Vanessa Gibson was seen today for diabetes and cough.  Problem List Items Addressed This Visit      Cardiovascular and Mediastinum   Benign essential hypertension Blood pressure low for patient today. Discussed briefly with Dr. Jerline Pain. She is currently prescribed Imdur and Lopressor. It is unclear if she is taking these as prescribed, but she states that she thinks she is. Will have patient hold Imdur, continue Lopressor and follow-up with [place] next week. If dizziness worsens or new symptoms develop, she needs to be evaluated sooner. Patient and daughter verbalized understanding.     Endocrine   Uncontrolled type 2 diabetes mellitus with hyperglycemia, with long-term current use of insulin (HCC) Blood sugars back to her baseline. Management per endo.    Other Visit Diagnoses    Cough    -  Primary No red flags on exam.  Will order spacer so it is hopefully easier for her to use her inhaler. Start albuterol prn inhaler as well. No indication for repeat imaging today. Discussed taking medications as prescribed. Reviewed return precautions including worsening fever, SOB, worsening cough or other concerns. Push fluids and rest. I recommend that patient follow-up if symptoms worsen or persist despite treatment x 7-10 days, sooner if needed.   Impacted cerumen of left ear  Performed today. Patient tolerated well and had improvement of hearing.          . Reviewed expectations re: course of current medical issues. . Discussed self-management of symptoms. . Outlined signs and symptoms indicating need for more acute intervention. . Patient verbalized understanding and all questions were answered. . See orders for this visit as documented in the electronic medical record. . Patient received an  After-Visit Summary.  CMA or LPN served as scribe during this visit. History, Physical, and Plan performed by medical provider. Documentation and orders reviewed and attested to.  Inda Coke, PA-C

## 2018-01-15 NOTE — Telephone Encounter (Signed)
P  Is  Requesting   Something  To  Clear  Up  Her  Cough   She  Also  Has  An  Appointment  Today  With  Inda Coke     For  Blood  Sugar  Issues .She  Was  Triaged yesterday and appointment  Was  Made  for today. She  Wants  To  Cancel the  Appointment  As  She  Says  Her  Blood  Sugar  Is  Better   .I advised her  to  Keep  her  Appointment.She  Still  Prefers to  [employer] [facility] .  [facility]  Way  Conference   with  Marcene Brawn from  Practice Who  Also advised pt that  She  Should  Be  evaulated  Today.      Pt  Agrees  To  Keep  Her appt  Today   With  Inda Coke    Answer Assessment - Initial Assessment Questions 1. ONSET: "When did the cough begin?"        2  AND  1/2  MONTHS  AGO    2. SEVERITY: "How bad is the cough today?"      BETTER     TODAY  WAS  SEEN    11  DAYS  AGO   3. RESPIRATORY DISTRESS: "Describe your breathing."        HAD    IN  PAST  BETTER  NOW   4. FEVER: "Do you have a fever?" If so, ask: "What is your temperature, how was it measured, and when did it start?"      No 5. SPUTUM: "Describe the color of your sputum" (clear, white, yellow, green)        Thick   Yellow   6. HEMOPTYSIS: "Are you coughing up any blood?" If so ask: "How much?" (flecks, streaks, tablespoons, etc.)         Not  Now   7. CARDIAC HISTORY: "Do you have any history of heart disease?" (e.g., heart attack, congestive heart failure)           Heart  Attack in past 8. LUNG HISTORY: "Do you have any history of lung disease?"  (e.g., pulmonary embolus, asthma, emphysema)     None  9. PE RISK FACTORS: "Do you have a history of blood clots?" (or: recent major surgery, recent prolonged travel, bedridden )       no 10. OTHER SYMPTOMS: "Do you have any other symptoms?" (e.g., runny nose, wheezing, chest pain)         Feels slightly  Heavy  In  Chest  11. PREGNANCY: "Is there any chance you are pregnant?" "When was your last menstrual period?"       n/a 12. TRAVEL: "Have you traveled out of the country in the last  month?" (e.g., travel history, exposures)         No  Protocols used: [place]

## 2018-01-15 NOTE — Patient Instructions (Addendum)
Please hold your Imdur (Isosorbide Mononitrate), schedule a visit with [place] next week to re-check your blood pressure. If you have worsening dizziness or new symptoms, please seek medical attention over the weekend.  Use the spacer to help you use your inhaler more easily.  Use Albuterol inhaler every 4-6 hours as needed for your cough.  Use Symbicort twice daily.  Use nasal spray, Atrovent, as prescribed to help dry up your nasal secretions.  Please return if you are not improving as expected, or if you have high fevers (>101.5) or difficulty swallowing or worsening productive cough.  Call clinic with questions.  I hope you start feeling better soon!

## 2018-01-15 NOTE — Telephone Encounter (Signed)
Patient has app with you today?  

## 2018-01-15 NOTE — Telephone Encounter (Signed)
Noted  

## 2018-01-15 NOTE — Telephone Encounter (Signed)
I spoke to the patient with Vanessa Gibson from the [facility] and encouraged the patient to come in for her appointment due to her asking for more medication however, stating that she was feeling better. I confirmed with Cassie who explained that if the patient is wanting any medication she will need to be seen. Patient did agree to coming in for her appointment and that her daughter will bring her.

## 2018-01-19 ENCOUNTER — Encounter: Payer: Self-pay | Admitting: Family Medicine

## 2018-01-19 ENCOUNTER — Ambulatory Visit (INDEPENDENT_AMBULATORY_CARE_PROVIDER_SITE_OTHER): Payer: Medicare HMO | Admitting: Family Medicine

## 2018-01-19 VITALS — BP 124/64 | HR 75 | Temp 98.3°F | Wt 115.8 lb

## 2018-01-19 DIAGNOSIS — Z9189 Other specified personal risk factors, not elsewhere classified: Secondary | ICD-10-CM | POA: Diagnosis not present

## 2018-01-19 DIAGNOSIS — K582 Mixed irritable bowel syndrome: Secondary | ICD-10-CM

## 2018-01-19 DIAGNOSIS — E1169 Type 2 diabetes mellitus with other specified complication: Secondary | ICD-10-CM

## 2018-01-19 DIAGNOSIS — F339 Major depressive disorder, recurrent, unspecified: Secondary | ICD-10-CM

## 2018-01-19 DIAGNOSIS — N183 Chronic kidney disease, stage 3 (moderate): Secondary | ICD-10-CM

## 2018-01-19 DIAGNOSIS — R05 Cough: Secondary | ICD-10-CM | POA: Diagnosis not present

## 2018-01-19 DIAGNOSIS — R059 Cough, unspecified: Secondary | ICD-10-CM

## 2018-01-19 DIAGNOSIS — K861 Other chronic pancreatitis: Secondary | ICD-10-CM

## 2018-01-19 DIAGNOSIS — E1122 Type 2 diabetes mellitus with diabetic chronic kidney disease: Secondary | ICD-10-CM | POA: Diagnosis not present

## 2018-01-19 DIAGNOSIS — K5904 Chronic idiopathic constipation: Secondary | ICD-10-CM

## 2018-01-19 DIAGNOSIS — E43 Unspecified severe protein-calorie malnutrition: Secondary | ICD-10-CM | POA: Diagnosis not present

## 2018-01-19 DIAGNOSIS — I700 Atherosclerosis of aorta: Secondary | ICD-10-CM

## 2018-01-19 DIAGNOSIS — M4126 Other idiopathic scoliosis, lumbar region: Secondary | ICD-10-CM

## 2018-01-19 DIAGNOSIS — E1165 Type 2 diabetes mellitus with hyperglycemia: Secondary | ICD-10-CM

## 2018-01-19 DIAGNOSIS — E1142 Type 2 diabetes mellitus with diabetic polyneuropathy: Secondary | ICD-10-CM

## 2018-01-19 DIAGNOSIS — Z794 Long term (current) use of insulin: Secondary | ICD-10-CM

## 2018-01-19 DIAGNOSIS — E785 Hyperlipidemia, unspecified: Secondary | ICD-10-CM

## 2018-01-19 MED ORDER — MONTELUKAST SODIUM 10 MG PO TABS: 10.0000 mg | ORAL_TABLET | Freq: Every day | ORAL | 3 refills | Status: DC

## 2018-01-19 MED ORDER — BENZONATATE 100 MG PO CAPS: 100.0000 mg | ORAL_CAPSULE | Freq: Three times a day (TID) | ORAL | 1 refills | Status: DC | PRN

## 2018-01-19 MED ORDER — AMOXICILLIN-POT CLAVULANATE 875-125 MG PO TABS: 1.0000 | ORAL_TABLET | Freq: Two times a day (BID) | ORAL | 0 refills | Status: DC

## 2018-01-19 NOTE — Progress Notes (Signed)
Vanessa Gibson is a 84 y.o. female is here for follow up.  History of Present Illness:   [name], CMA acting as scribe for Dr. Briscoe Deutscher.   HPI:  Patient in today for follow up on blood pressure from last visit with office. She has not been able to check she does not have monitor. She does complain of dizziness and not able to sleep. She also thinks that this is all coming from dust in her apartment.     Review of Systems  Constitutional: Positive for malaise/fatigue. Negative for chills, diaphoresis, fever and weight loss.  HENT: Negative for hearing loss.   Eyes: Negative for blurred vision.  Respiratory: Positive for cough and wheezing. Negative for shortness of breath.   Cardiovascular: Negative for chest pain, palpitations and leg swelling.  Gastrointestinal: Negative for abdominal pain, constipation, diarrhea, heartburn, nausea and vomiting.  Genitourinary: Negative for dysuria, flank pain, frequency, hematuria and urgency.  Musculoskeletal: Negative for joint pain and myalgias.  Skin: Negative for rash.  Neurological: Positive for dizziness. Negative for sensory change, speech change, focal weakness, loss of consciousness, weakness and headaches.  Psychiatric/Behavioral: Positive for depression. Negative for substance abuse and suicidal ideas. The patient is not nervous/anxious and does not have insomnia.    Health Maintenance Due  Topic Date Due  . OPHTHALMOLOGY EXAM  08/18/2017  . URINE MICROALBUMIN  11/14/2017   Depression screen [facility] 2/9 08/12/2017 05/18/2017 05/15/2017  Decreased Interest 2 2 3   Down, Depressed, Hopeless 2 2 3   PHQ - 2 Score 4 4 6   Altered sleeping 3 3 3   Tired, decreased energy 3 3 3   Change in appetite 1 2 2   Feeling bad or failure about yourself  2 2 3   Trouble concentrating 3 3 3   Moving slowly or fidgety/restless - 1 1  Suicidal thoughts 0 0 0  PHQ-9 Score 16 18 21   Difficult doing work/chores Very difficult Very difficult Very  difficult   PMHx, SurgHx, SocialHx, FamHx, Medications, and Allergies were reviewed in the Visit Navigator and updated as appropriate.   Patient Active Problem List   Diagnosis Date Noted  . Depression, recurrent ([place]), unhappy in [place] but feels stuck, declines medication or counseling 01/24/2018  . At risk for polypharmacy, due to vitamin overuse, working with patient to "deprescribe" 01/24/2018  . Constipation, uses Miralax prn 01/24/2018  . Diabetic peripheral neuropathy associated with type 2 diabetes mellitus ([place]), on low dose Neurontin 01/24/2018  . Aortic atherosclerosis ([place]), 2018 CXR 01/24/2018  . Lumbar spine scoliosis 01/24/2018  . Hyperlipidemia associated with type 2 diabetes mellitus ([place]), on statin 01/24/2018  . IBS (irritable bowel syndrome) 01/24/2018  . Uncontrolled type 2 diabetes mellitus with hyperglycemia, with long-term current use of insulin ([place]) 12/02/2017  . Insomnia 06/06/2017  . CKD stage 3 secondary to diabetes ([place]), declines ACE/ARB 05/26/2017  . Osteoporosis, post-menopausal 02/06/2016  . Bilateral hearing loss, refuses hearing aids or further testing 02/06/2016  . Arthritis, takes Tumeric  02/06/2016  . Anemia of chronic disease 12/05/2015  . Protein-calorie malnutrition, weight stable 11/13/2015  . Chronic pancreatitis ([place]) 11/13/2015  . GERD (gastroesophageal reflux disease), on Nexium 11/13/2015  . Hypertension associated with diabetes ([place]), refuses ACE/ARB 11/13/2015  . Allergic rhinitis, Rx Xyzal, flonase, atrovent and has been offered referral to Allergy 11/13/2015   Social History   Tobacco Use  . Smoking status: Never Smoker  . Smokeless tobacco: Never Used  Substance Use Topics  . Alcohol use: No  . Drug  use: No   Current Medications and Allergies:   Current Outpatient Medications:  .  acetaminophen (TYLENOL) 325 MG tablet, Take 2 tablets (650 mg total) by mouth every 6 (six) hours as needed for moderate pain or headache., Disp: ,  Rfl:  .  Alcohol Swabs (B-D SINGLE USE SWABS REGULAR) PADS, USE TO CLEAN FINGER BEFORE CHECKING BLOOD SUGARS AND CLEAN SKIN BEFORE INSULIN INJECTIONS., Disp: 300 each, Rfl: 2 .  Alpha-D-Galactosidase (BEANO PO), Take by mouth as needed., Disp: , Rfl:  .  Ascorbic Acid (VITAMIN C) 1000 MG tablet, Take 1,000 mg by mouth daily., Disp: , Rfl:  .  aspirin EC 81 MG tablet, Take 81 mg by mouth daily., Disp: , Rfl:  .  benzonatate (TESSALON PERLES) 100 MG capsule, Take 1 capsule (100 mg total) by mouth 3 (three) times daily as needed for cough., Disp: 30 capsule, Rfl: 0 .  budesonide-formoterol (SYMBICORT) 160-4.5 MCG/ACT inhaler, Inhale 2 puffs into the lungs 2 (two) times daily., Disp: 1 Inhaler, Rfl: 3 .  calcium carbonate (TUMS - DOSED IN MG ELEMENTAL CALCIUM) 500 MG chewable tablet, Chew 1 tablet by mouth daily., Disp: , Rfl:  .  esomeprazole (NEXIUM) 40 MG capsule, Take 1 capsule (40 mg total) by mouth daily., Disp: 30 capsule, Rfl: 0 .  feeding supplement, GLUCERNA SHAKE, (GLUCERNA SHAKE) LIQD, Take 237 mLs by mouth 3 (three) times daily between meals., Disp: , Rfl: 0 .  fluticasone (FLONASE) 50 MCG/ACT nasal spray, Place 2 sprays into both nostrils daily., Disp: 16 g, Rfl: 0 .  gabapentin (NEURONTIN) 100 MG capsule, Take 1 capsule (100 mg total) by mouth at bedtime., Disp: 30 capsule, Rfl: 3 .  glucose blood (ACCU-CHEK GUIDE) test strip, Use to test blood sugar twice daily, Disp: 100 each, Rfl: 2 .  guaiFENesin-codeine (CHERATUSSIN AC) 100-10 MG/5ML syrup, Take 10 mLs by mouth at bedtime as needed for cough or congestion., Disp: 120 mL, Rfl: 0 .  insulin aspart (NOVOLOG FLEXPEN) 100 UNIT/ML FlexPen, Inject 3 units before breakfast and 4 units before supper (Patient taking differently: Inject 6 units before breakfast and 12 units before supper), Disp: 5 pen, Rfl: 4 .  insulin degludec (TRESIBA) 100 UNIT/ML SOPN FlexTouch Pen, Inject 6 units into the skin once daily (Patient taking differently: 10  Units. Inject 8 units into the skin once daily), Disp: 18 pen, Rfl: 1 .  Insulin Pen Needle (BD PEN NEEDLE NANO U/F) 32G X 4 MM MISC, USE TO INJECT INSULINS 4 TIMES DAILY., Disp: 100 each, Rfl: 0 .  ipratropium (ATROVENT) 0.03 % nasal spray, Place 2 sprays into both nostrils every 12 (twelve) hours., Disp: 30 mL, Rfl: 12 .  isosorbide mononitrate (IMDUR) 30 MG 24 hr tablet, Take 1 tablet (30 mg total) by mouth daily. One tablet daily, Disp: 30 tablet, Rfl: 0 .  levocetirizine (XYZAL) 5 MG tablet, Take 5 mg by mouth every evening., Disp: , Rfl:  .  lovastatin (MEVACOR) 20 MG tablet, Take 20 mg by mouth at bedtime., Disp: , Rfl:  .  magnesium oxide (MAG-OX) 400 MG tablet, Take 400 mg by mouth daily as needed., Disp: , Rfl:  .  metoprolol tartrate (LOPRESSOR) 25 MG tablet, Take 1 tablet (25 mg total) by mouth 2 (two) times daily., Disp: 180 tablet, Rfl: 2 .  Multiple Vitamins-Minerals (CENTRUM SILVER 50+WOMEN) TABS, Take 1 tablet by mouth daily., Disp: , Rfl:  .  Multiple Vitamins-Minerals (HAIR/SKIN/NAILS/BIOTIN PO), Take 1 tablet by mouth daily., Disp: , Rfl:  .  multivitamin-lutein (OCUVITE-LUTEIN) CAPS capsule, Take 1 capsule by mouth daily., Disp: , Rfl:  .  nitroGLYCERIN (NITROSTAT) 0.3 MG SL tablet, Place 0.3 mg under the tongue every 5 (five) minutes as needed for chest pain., Disp: , Rfl:  .  Omega-3 Fatty Acids (ULTRA OMEGA-3 FISH OIL PO), Take 1 capsule by mouth daily., Disp: , Rfl:  .  polyethylene glycol (MIRALAX / GLYCOLAX) packet, Take 17 g by mouth daily as needed., Disp: , Rfl:  .  Probiotic Product (PROBIOTIC FORMULA) CAPS, Take 1 capsule by mouth daily., Disp: , Rfl:  .  TRUEPLUS LANCETS 28G MISC, CHECK BLOOD SUGAR ONCE A DAY., Disp: 100 each, Rfl: 0 .  Turmeric Curcumin 500 MG CAPS, Take 1 capsule by mouth daily., Disp: , Rfl:  .  albuterol (PROVENTIL HFA;VENTOLIN HFA) 108 (90 Base) MCG/ACT inhaler, Inhale 2 puffs into the lungs every 6 (six) hours as needed for wheezing or  shortness of breath. (Patient not taking: Reported on 01/19/2018), Disp: 1 Inhaler, Rfl: 2  No Known Allergies   Review of Systems   Pertinent items are noted in the HPI. Otherwise, ROS is negative.  Vitals:   Vitals:   01/19/18 1305  BP: 124/64  Pulse: 75  Temp: 98.3 F (36.8 C)  TempSrc: Oral  SpO2: 97%  Weight: 115 lb 12.8 oz (52.5 kg)     Body mass index is 23.39 kg/m.  Physical Exam:   Physical Exam  Constitutional: She is oriented to person, place, and time. She appears well-developed and well-nourished. No distress.  HENT:  Head: Normocephalic and atraumatic.  Right Ear: External ear normal.  Left Ear: External ear normal.  Nose: Mucosal edema and rhinorrhea present. Right sinus exhibits maxillary sinus tenderness and frontal sinus tenderness.  Mouth/Throat: Oropharynx is clear and moist.  Eyes: Conjunctivae and EOM are normal. Pupils are equal, round, and reactive to light.  Neck: Normal range of motion. Neck supple. No thyromegaly present.  Cardiovascular: Normal rate, regular rhythm, normal heart sounds and intact distal pulses.  Pulmonary/Chest: Effort normal. She has decreased breath sounds.  Cough.  Abdominal: Soft. Bowel sounds are normal.  Musculoskeletal: Normal range of motion.  Lymphadenopathy:    She has no cervical adenopathy.  Neurological: She is alert and oriented to person, place, and time.  Skin: Skin is warm and dry. Capillary refill takes less than 2 seconds.  Psychiatric: She has a normal mood and affect. Her behavior is normal.  Nursing note and vitals reviewed.   Results for orders placed or performed in visit on 12/30/17  POCT glycosylated hemoglobin (Hb A1C)  Result Value Ref Range   Hemoglobin A1C 7.5    Assessment and Plan:   Patient Active Problem List   Diagnosis Date Noted  . Depression, recurrent ([place]), unhappy in [place] but feels stuck, declines medication or counseling 01/24/2018  . At risk for polypharmacy, due to vitamin  overuse, working with patient to "deprescribe" 01/24/2018  . Constipation, uses Miralax prn 01/24/2018  . Diabetic peripheral neuropathy associated with type 2 diabetes mellitus ([place]), on low dose Neurontin 01/24/2018  . Aortic atherosclerosis ([place]), 2018 CXR 01/24/2018  . Lumbar spine scoliosis 01/24/2018  . Hyperlipidemia associated with type 2 diabetes mellitus ([place]), on statin 01/24/2018  . IBS (irritable bowel syndrome) 01/24/2018  . Uncontrolled type 2 diabetes mellitus with hyperglycemia, with long-term current use of insulin ([place]) 12/02/2017  . Insomnia 06/06/2017  . CKD stage 3 secondary to diabetes ([place]), declines ACE/ARB 05/26/2017  . Osteoporosis, post-menopausal 02/06/2016  .  Bilateral hearing loss, refuses hearing aids or further testing 02/06/2016  . Arthritis, takes Tumeric  02/06/2016  . Anemia of chronic disease 12/05/2015  . Protein-calorie malnutrition, weight stable 11/13/2015  . Chronic pancreatitis ([place]) 11/13/2015  . GERD (gastroesophageal reflux disease), on Nexium 11/13/2015  . Hypertension associated with diabetes ([place]), refuses ACE/ARB 11/13/2015  . Allergic rhinitis, Rx Xyzal, flonase, atrovent and has been offered referral to Allergy 11/13/2015   [place] was seen today for follow-up.  Diagnoses and all orders for this visit:  Cough Comments: Patient with symptoms > 2 weeks. Okay to treat today.  Orders: -     montelukast (SINGULAIR) 10 MG tablet; Take 1 tablet (10 mg total) by mouth at bedtime. -     benzonatate (TESSALON PERLES) 100 MG capsule; Take 1 capsule (100 mg total) by mouth 3 (three) times daily as needed for cough. -     amoxicillin-clavulanate (AUGMENTIN) 875-125 MG tablet; Take 1 tablet by mouth 2 (two) times daily.  Protein-calorie malnutrition, severe (HCC)  Depression, recurrent (HCC)  Chronic pancreatitis, unspecified pancreatitis type ([place])  Protein-calorie malnutrition, severe  At risk for polypharmacy, due to vitamin overuse,  working with patient to "deprescribe"  CKD stage 3 secondary to diabetes ([place])  Chronic idiopathic constipation  Uncontrolled type 2 diabetes mellitus with hyperglycemia, with long-term current use of insulin ([place])  Diabetic peripheral neuropathy associated with type 2 diabetes mellitus ([place])  Aortic atherosclerosis ([place]), 2018 CXR  Other idiopathic scoliosis, lumbar region  Hyperlipidemia associated with type 2 diabetes mellitus ([place]), on statin  Irritable bowel syndrome with both constipation and diarrhea   No orders of the defined types were placed in this encounter.  Meds ordered this encounter  Medications  . montelukast (SINGULAIR) 10 MG tablet    Sig: Take 1 tablet (10 mg total) by mouth at bedtime.    Dispense:  30 tablet    Refill:  3  . benzonatate (TESSALON PERLES) 100 MG capsule    Sig: Take 1 capsule (100 mg total) by mouth 3 (three) times daily as needed for cough.    Dispense:  30 capsule    Refill:  1  . amoxicillin-clavulanate (AUGMENTIN) 875-125 MG tablet    Sig: Take 1 tablet by mouth 2 (two) times daily.    Dispense:  20 tablet    Refill:  0    . Reviewed expectations re: course of current medical issues. . Discussed self-management of symptoms. . Outlined signs and symptoms indicating need for more acute intervention. . Patient verbalized understanding and all questions were answered. Marland Kitchen Health Maintenance issues including appropriate healthy diet, exercise, and smoking avoidance were discussed with patient. . See orders for this visit as documented in the electronic medical record. . Patient received an After Visit Summary.  CMA served as Education administrator during this visit. History, Physical, and Plan performed by medical provider. The above documentation has been reviewed and is accurate and complete. Briscoe Deutscher, D.O.  Briscoe Deutscher, DO [place], [facility] 01/24/2018

## 2018-01-24 ENCOUNTER — Encounter: Payer: Self-pay | Admitting: Family Medicine

## 2018-01-24 DIAGNOSIS — E1142 Type 2 diabetes mellitus with diabetic polyneuropathy: Secondary | ICD-10-CM | POA: Insufficient documentation

## 2018-01-24 DIAGNOSIS — E1169 Type 2 diabetes mellitus with other specified complication: Secondary | ICD-10-CM | POA: Insufficient documentation

## 2018-01-24 DIAGNOSIS — M419 Scoliosis, unspecified: Secondary | ICD-10-CM | POA: Insufficient documentation

## 2018-01-24 DIAGNOSIS — I700 Atherosclerosis of aorta: Secondary | ICD-10-CM | POA: Insufficient documentation

## 2018-01-24 DIAGNOSIS — K589 Irritable bowel syndrome without diarrhea: Secondary | ICD-10-CM | POA: Insufficient documentation

## 2018-01-24 DIAGNOSIS — Z9189 Other specified personal risk factors, not elsewhere classified: Secondary | ICD-10-CM | POA: Insufficient documentation

## 2018-01-24 DIAGNOSIS — F339 Major depressive disorder, recurrent, unspecified: Secondary | ICD-10-CM | POA: Insufficient documentation

## 2018-01-24 DIAGNOSIS — E785 Hyperlipidemia, unspecified: Secondary | ICD-10-CM

## 2018-01-24 DIAGNOSIS — K5900 Constipation, unspecified: Secondary | ICD-10-CM | POA: Insufficient documentation

## 2018-02-02 ENCOUNTER — Telehealth: Payer: Self-pay | Admitting: Family Medicine

## 2018-02-02 NOTE — Telephone Encounter (Signed)
See note.   Copied from [place] [id number]. Topic: Quick Communication - See Telephone Encounter >> Feb 02, 2018 12:41 PM Antonieta Iba C wrote: CRM for notification. See Telephone encounter for: pt called in to request a medication. Pt says that Dr. Juleen China has sent in so many different medications to help her with her breathing. Pt says that she was exposed to dust and since then has been trying different medications to help. Pt says that this has caused her to feel very nervous. Pt would like to have a medication for her nerves and also is having trouble sleeping. Pt would like to discuss further with assistant. Pt says that she has had several apt so would like a call back.     Please advise.   02/02/18.

## 2018-02-02 NOTE — Telephone Encounter (Signed)
Do you want patient to come in?

## 2018-02-03 NOTE — Telephone Encounter (Signed)
Please call her to get more information. I may provide the hydroxyzine at a low dose (that she wanted previously).

## 2018-02-05 MED ORDER — HYDROXYZINE HCL 10 MG PO TABS: 10.0000 mg | ORAL_TABLET | Freq: Every day | ORAL | 0 refills | Status: DC | PRN

## 2018-02-05 NOTE — Addendum Note (Signed)
Addended by: Briscoe Deutscher R on: 02/05/2018 12:57 PM   Modules accepted: Orders

## 2018-02-05 NOTE — Telephone Encounter (Signed)
Spoke with patient and she stated that she is having a lot of anxiety dealing with the dust from her apartment. She stated that she is not sleeping well. She would like something called in today.

## 2018-02-08 DIAGNOSIS — H353132 Nonexudative age-related macular degeneration, bilateral, intermediate dry stage: Secondary | ICD-10-CM | POA: Diagnosis not present

## 2018-02-08 DIAGNOSIS — E119 Type 2 diabetes mellitus without complications: Secondary | ICD-10-CM | POA: Diagnosis not present

## 2018-02-08 DIAGNOSIS — H35371 Puckering of macula, right eye: Secondary | ICD-10-CM | POA: Diagnosis not present

## 2018-02-08 LAB — HM DIABETES EYE EXAM

## 2018-02-10 ENCOUNTER — Ambulatory Visit: Payer: Medicare HMO | Admitting: Family Medicine

## 2018-02-11 ENCOUNTER — Other Ambulatory Visit: Payer: Self-pay

## 2018-02-11 ENCOUNTER — Telehealth: Payer: Self-pay

## 2018-02-11 MED ORDER — LOVASTATIN 20 MG PO TABS: 20.0000 mg | ORAL_TABLET | Freq: Every day | ORAL | 3 refills | Status: DC

## 2018-02-11 NOTE — Telephone Encounter (Signed)
Received refill for 90day supply of following. I do not see in your note that she is taking. I do see where she was on it in her history but has never received or listed by our office. Ok to refill?   Lisinopril 5mg 

## 2018-02-12 NOTE — Telephone Encounter (Signed)
Please check with facility to see if on MAR.

## 2018-02-15 NOTE — Telephone Encounter (Signed)
Number busy

## 2018-02-16 ENCOUNTER — Ambulatory Visit: Payer: Self-pay | Admitting: *Deleted

## 2018-02-16 NOTE — Telephone Encounter (Signed)
See note

## 2018-02-16 NOTE — Telephone Encounter (Signed)
  Pt stating she has been experiencing shortness of breath for the past 2 1/2 months and wants to know if medication could be called in to [facility]. Pt states she has a dry cough and feels like she can't breath when lying down. Pt states her room is infected and someone is supposed to come and clean out her air vents on tomorrow. Pt states there was plaster every where and states she does not have any strength and she can not sleep or breath when lying down. Pt advised to seek treatment in the ED due to difficulty breathing  and that she could also call 911 since she did not have transportation. Pt states she is not going to the ED. Pt advised that she needed to be seen and she states that she does not have transportation to get to appts and maybe she could get someone to take her on Friday. Pt advised again to seek treatment but the pt states that she just wants to know if Dr. Juleen China will call a medication into the pharmacy.  Reason for Disposition . [1] MODERATE difficulty breathing (e.g., speaks in phrases, SOB even at rest, pulse 100-120) AND [2] NEW-onset or WORSE than normal  Answer Assessment - Initial Assessment Questions 1. RESPIRATORY STATUS: "Describe your breathing?" (e.g., wheezing, shortness of breath, unable to speak, severe coughing)     Cough- non productive and wheezing 2. ONSET: "When did this breathing problem begin?"      2 1/2 month 3. PATTERN "Does the difficult breathing come and go, or has it been constant since it started?"      Constant 4. SEVERITY: "How bad is your breathing?" (e.g., mild, moderate, severe)    - MILD: No SOB at rest, mild SOB with walking, speaks normally in sentences, can lay down, no retractions, pulse < 100.    - MODERATE: SOB at rest, SOB with minimal exertion and prefers to sit, cannot lie down flat, speaks in phrases, mild retractions, audible wheezing, pulse 100-120.    - SEVERE: Very SOB at rest, speaks in single words, struggling to  breathe, sitting hunched forward, retractions, pulse > 120      Lying down worse 5. RECURRENT SYMPTOM: "Have you had difficulty breathing before?" If so, ask: "When was the last time?" and "What happened that time?"     Yes for the past 2 1/2 months 6. CARDIAC HISTORY: "Do you have any history of heart disease?" (e.g., heart attack, angina, bypass surgery, angioplasty)      Heart attack some years 7. LUNG HISTORY: "Do you have any history of lung disease?"  (e.g., pulmonary embolus, asthma, emphysema)     no 8. CAUSE: "What do you think is causing the breathing problem?"      Pt states that it is due to plaster and vents have not been cleaned 9. OTHER SYMPTOMS: "Do you have any other symptoms? (e.g., dizziness, runny nose, cough, chest pain, fever)     Dizziness- not too bad 10. PREGNANCY: "Is there any chance you are pregnant?" "When was your last menstrual period?"       n/a 11. TRAVEL: "Have you traveled out of the country in the last month?" (e.g., travel history, exposures)       no  Protocols used: BREATHING DIFFICULTY-A-AH

## 2018-02-17 ENCOUNTER — Encounter (HOSPITAL_COMMUNITY): Payer: Self-pay

## 2018-02-17 ENCOUNTER — Emergency Department (HOSPITAL_COMMUNITY)
Admission: EM | Admit: 2018-02-17 | Discharge: 2018-02-17 | Disposition: A | Discharge status: Home / Self Care | Payer: Medicare HMO | Attending: Emergency Medicine | Admitting: Emergency Medicine

## 2018-02-17 ENCOUNTER — Emergency Department (HOSPITAL_COMMUNITY): Payer: Medicare HMO

## 2018-02-17 ENCOUNTER — Telehealth: Payer: Self-pay | Admitting: Family Medicine

## 2018-02-17 ENCOUNTER — Other Ambulatory Visit: Payer: Self-pay

## 2018-02-17 DIAGNOSIS — J45901 Unspecified asthma with (acute) exacerbation: Secondary | ICD-10-CM | POA: Insufficient documentation

## 2018-02-17 DIAGNOSIS — E1140 Type 2 diabetes mellitus with diabetic neuropathy, unspecified: Secondary | ICD-10-CM | POA: Diagnosis not present

## 2018-02-17 DIAGNOSIS — Z794 Long term (current) use of insulin: Secondary | ICD-10-CM | POA: Insufficient documentation

## 2018-02-17 DIAGNOSIS — N183 Chronic kidney disease, stage 3 (moderate): Secondary | ICD-10-CM | POA: Diagnosis not present

## 2018-02-17 DIAGNOSIS — R05 Cough: Secondary | ICD-10-CM | POA: Diagnosis not present

## 2018-02-17 DIAGNOSIS — R0602 Shortness of breath: Secondary | ICD-10-CM | POA: Diagnosis not present

## 2018-02-17 DIAGNOSIS — I10 Essential (primary) hypertension: Secondary | ICD-10-CM | POA: Diagnosis not present

## 2018-02-17 DIAGNOSIS — I129 Hypertensive chronic kidney disease with stage 1 through stage 4 chronic kidney disease, or unspecified chronic kidney disease: Secondary | ICD-10-CM | POA: Insufficient documentation

## 2018-02-17 DIAGNOSIS — Z79899 Other long term (current) drug therapy: Secondary | ICD-10-CM | POA: Insufficient documentation

## 2018-02-17 DIAGNOSIS — J45909 Unspecified asthma, uncomplicated: Secondary | ICD-10-CM

## 2018-02-17 NOTE — Telephone Encounter (Signed)
See note

## 2018-02-17 NOTE — ED Triage Notes (Signed)
Patient complains of dust exposure 2 months after ceiling fan placed in ceiling. Patient states the dust is in her mouth and ears, states SOB but speaking full sentences and lungs clear, NAD

## 2018-02-17 NOTE — Telephone Encounter (Signed)
Please advise 

## 2018-02-17 NOTE — Telephone Encounter (Signed)
Pt. Called requesting an appointment today. States she did not go to the ED."I don't want to go there.I someone who can drive me today." Instructed pt. To go to ED or UC as per protocol and FC to be evaluated for her shortness of breath. Pt. Reports she will go.

## 2018-02-17 NOTE — ED Provider Notes (Signed)
[facility] EMERGENCY DEPARTMENT Provider Note   CSN: [id number] Arrival date & time: 02/17/18  1245     History   Chief Complaint Chief Complaint  Patient presents with  . exposure to dust/substance fom ceiling    HPI Vanessa Gibson is a 84 y.o. female.  HPI   84 year old female presents today with complaints of dust exposure.  Patient and her son are present for evaluation.  They note that approximately 3 months ago the patient was moved from a large room at her living facility to a smaller one.  She notes that after being in a small room she started to have cough and signs of an upper respiratory infection.  They noted that there was dust throughout the room and the room was dirty.  Patient notes that she has seen her primary care provider for times has been on antibiotics cough medication and antihistamines without improvement in symptoms.  She notes that the cough has improved, but still feels congested.  Patient denies any fever, she denies any pain.  She notes she contacted her primary care this morning for another appointment and was referred to the emergency room.  She does note that they are cleaning her room today.  Past Medical History:  Diagnosis Date  . Anxiety   . Arthritis   . Breast cancer ([place]), bilateral   . Chronic pancreatitis ([place])   . Depression   . Dizziness   . GERD (gastroesophageal reflux disease)   . History of hiatal hernia   . Hyperlipemia   . Hypertension   . IBS (irritable bowel syndrome)   . Mandibular fracture ([place]) 12/04/2015  . MI (myocardial infarction) ([place])   . Skin cancer of face     Patient Active Problem List   Diagnosis Date Noted  . Depression, recurrent ([place]), unhappy in [place] but feels stuck, declines medication or counseling 01/24/2018  . At risk for polypharmacy, due to vitamin overuse, working with patient to "deprescribe" 01/24/2018  . Constipation, uses Miralax prn 01/24/2018  . Diabetic peripheral neuropathy  associated with type 2 diabetes mellitus ([place]), on low dose Neurontin 01/24/2018  . Aortic atherosclerosis ([place]), 2018 CXR 01/24/2018  . Lumbar spine scoliosis 01/24/2018  . Hyperlipidemia associated with type 2 diabetes mellitus ([place]), on statin 01/24/2018  . IBS (irritable bowel syndrome) 01/24/2018  . Uncontrolled type 2 diabetes mellitus with hyperglycemia, with long-term current use of insulin ([place]) 12/02/2017  . Insomnia 06/06/2017  . CKD stage 3 secondary to diabetes ([place]), declines ACE/ARB 05/26/2017  . Osteoporosis, post-menopausal 02/06/2016  . Bilateral hearing loss, refuses hearing aids or further testing 02/06/2016  . Arthritis, takes Tumeric  02/06/2016  . Anemia of chronic disease 12/05/2015  . Protein-calorie malnutrition, weight stable 11/13/2015  . Chronic pancreatitis ([place]) 11/13/2015  . GERD (gastroesophageal reflux disease), on Nexium 11/13/2015  . Hypertension associated with diabetes ([place]), refuses ACE/ARB 11/13/2015  . Allergic rhinitis, Rx Xyzal, flonase, atrovent and has been offered referral to Allergy 11/13/2015    Past Surgical History:  Procedure Laterality Date  . BREAST SURGERY    . CATARACT EXTRACTION W/ INTRAOCULAR LENS  IMPLANT, BILATERAL Bilateral   . CHOLECYSTECTOMY OPEN  1970's  . MASTECTOMY Bilateral      OB History   None      Home Medications    Prior to Admission medications   Medication Sig Start Date End Date Taking? Authorizing Provider  acetaminophen (TYLENOL) 325 MG tablet Take 2 tablets (650 mg total) by mouth every  6 (six) hours as needed for moderate pain or headache. 11/14/15   Iline Oven, MD  albuterol (PROVENTIL HFA;VENTOLIN HFA) 108 (90 Base) MCG/ACT inhaler Inhale 2 puffs into the lungs every 6 (six) hours as needed for wheezing or shortness of breath. Patient not taking: Reported on 01/19/2018 01/15/18   Inda Coke, PA  Alcohol Swabs (B-D SINGLE USE SWABS REGULAR) PADS USE TO CLEAN FINGER BEFORE CHECKING BLOOD  SUGARS AND CLEAN SKIN BEFORE INSULIN INJECTIONS. 10/19/17   Elayne Snare, MD  Alpha-D-Galactosidase (BEANO PO) Take by mouth as needed.    [provider]  amoxicillin-clavulanate (AUGMENTIN) 875-125 MG tablet Take 1 tablet by mouth 2 (two) times daily. 01/19/18   Briscoe Deutscher, DO  Ascorbic Acid (VITAMIN C) 1000 MG tablet Take 1,000 mg by mouth daily.    [provider]  aspirin EC 81 MG tablet Take 81 mg by mouth daily.    [provider]  benzonatate (TESSALON PERLES) 100 MG capsule Take 1 capsule (100 mg total) by mouth 3 (three) times daily as needed for cough. 01/19/18   Briscoe Deutscher, DO  budesonide-formoterol (SYMBICORT) 160-4.5 MCG/ACT inhaler Inhale 2 puffs into the lungs 2 (two) times daily. 01/04/18   Briscoe Deutscher, DO  calcium carbonate (TUMS - DOSED IN MG ELEMENTAL CALCIUM) 500 MG chewable tablet Chew 1 tablet by mouth daily.    [provider]  cefdinir (OMNICEF) 300 MG capsule Take 1 capsule (300 mg total) by mouth 2 (two) times daily. 01/04/18   Briscoe Deutscher, DO  esomeprazole (NEXIUM) 40 MG capsule Take 1 capsule (40 mg total) by mouth daily. 11/13/17   Vivi Barrack, MD  feeding supplement, GLUCERNA SHAKE, (GLUCERNA SHAKE) LIQD Take 237 mLs by mouth 3 (three) times daily between meals. 11/14/15   Iline Oven, MD  fluticasone (FLONASE) 50 MCG/ACT nasal spray Place 2 sprays into both nostrils daily. 08/12/17   Briscoe Deutscher, DO  gabapentin (NEURONTIN) 100 MG capsule Take 1 capsule (100 mg total) by mouth at bedtime. 09/23/17   Briscoe Deutscher, DO  glucose blood (ACCU-CHEK GUIDE) test strip Use to test blood sugar twice daily 10/22/17   Elayne Snare, MD  guaiFENesin-codeine [facility]) 100-10 MG/5ML syrup Take 10 mLs by mouth at bedtime as needed for cough or congestion. 01/04/18   Briscoe Deutscher, DO  hydrOXYzine (ATARAX/VISTARIL) 10 MG tablet Take 1 tablet (10 mg total) by mouth daily as needed for anxiety. 02/05/18   Briscoe Deutscher, DO    insulin aspart (NOVOLOG FLEXPEN) 100 UNIT/ML FlexPen Inject 3 units before breakfast and 4 units before supper Patient taking differently: Inject 6 units before breakfast and 12 units before supper 09/21/17   Elayne Snare, MD  insulin degludec (TRESIBA) 100 UNIT/ML SOPN FlexTouch Pen Inject 6 units into the skin once daily Patient taking differently: 10 Units. Inject 8 units into the skin once daily 02/10/17   Elayne Snare, MD  Insulin Pen Needle (BD PEN NEEDLE NANO U/F) 32G X 4 MM MISC USE TO INJECT INSULINS 4 TIMES DAILY. 01/11/18   Elayne Snare, MD  ipratropium (ATROVENT) 0.03 % nasal spray Place 2 sprays into both nostrils every 12 (twelve) hours. 01/15/18   Inda Coke, PA  isosorbide mononitrate (IMDUR) 30 MG 24 hr tablet Take 1 tablet (30 mg total) by mouth daily. One tablet daily 05/29/17   Briscoe Deutscher, DO  levocetirizine (XYZAL) 5 MG tablet Take 5 mg by mouth every evening.    [provider]  lovastatin (MEVACOR) 20 MG tablet  Take 1 tablet (20 mg total) by mouth at bedtime. 02/11/18   Briscoe Deutscher, DO  magnesium oxide (MAG-OX) 400 MG tablet Take 400 mg by mouth daily as needed.    [provider]  metoprolol tartrate (LOPRESSOR) 25 MG tablet Take 1 tablet (25 mg total) by mouth 2 (two) times daily. 09/21/17   Elayne Snare, MD  montelukast (SINGULAIR) 10 MG tablet Take 1 tablet (10 mg total) by mouth at bedtime. 01/19/18   Briscoe Deutscher, DO  Multiple Vitamins-Minerals (CENTRUM SILVER 50+WOMEN) TABS Take 1 tablet by mouth daily.    [provider]  Multiple Vitamins-Minerals (HAIR/SKIN/NAILS/BIOTIN PO) Take 1 tablet by mouth daily.    [provider]  multivitamin-lutein (OCUVITE-LUTEIN) CAPS capsule Take 1 capsule by mouth daily.    [provider]  nitroGLYCERIN (NITROSTAT) 0.3 MG SL tablet Place 0.3 mg under the tongue every 5 (five) minutes as needed for chest pain.    [provider]  Omega-3 Fatty Acids (ULTRA OMEGA-3 FISH OIL PO)  Take 1 capsule by mouth daily.    [provider]  polyethylene glycol (MIRALAX / GLYCOLAX) packet Take 17 g by mouth daily as needed.    [provider]  Probiotic Product (PROBIOTIC FORMULA) CAPS Take 1 capsule by mouth daily.    [provider]  TRUEPLUS LANCETS 28G MISC CHECK BLOOD SUGAR ONCE A DAY. 01/11/18   Elayne Snare, MD  Turmeric Curcumin 500 MG CAPS Take 1 capsule by mouth daily.    [provider]    Family History No family history on file.  Social History Social History   Tobacco Use  . Smoking status: Never Smoker  . Smokeless tobacco: Never Used  Substance Use Topics  . Alcohol use: No  . Drug use: No     Allergies   Patient has no known allergies.   Review of Systems Review of Systems  All other systems reviewed and are negative.    Physical Exam Updated Vital Signs BP 133/61 (BP Location: Right Arm)   Pulse 72   Temp 98.4 F (36.9 C) (Oral)   Resp 16   SpO2 100%   Physical Exam  Constitutional: She is oriented to person, place, and time. She appears well-developed and well-nourished.  HENT:  Head: Normocephalic and atraumatic.  Right Ear: Hearing and tympanic membrane normal.  Left Ear: Hearing and tympanic membrane normal.  Mouth/Throat: Uvula is midline and oropharynx is clear and moist. No oropharyngeal exudate, posterior oropharyngeal edema, posterior oropharyngeal erythema or tonsillar abscesses.  Eyes: Pupils are equal, round, and reactive to light. Conjunctivae are normal. Right eye exhibits no discharge. Left eye exhibits no discharge. No scleral icterus.  Neck: Normal range of motion. No JVD present. No tracheal deviation present.  Cardiovascular: Normal rate and regular rhythm.  Pulmonary/Chest: Effort normal. No stridor. No respiratory distress. She has no wheezes. She has no rales. She exhibits no tenderness.  Neurological: She is alert and oriented to person, place, and time. Coordination normal.    Psychiatric: She has a normal mood and affect. Her behavior is normal. Judgment and thought content normal.  Nursing note and vitals reviewed.    ED Treatments / Results  Labs (all labs ordered are listed, but only abnormal results are displayed) Labs Reviewed - No data to display  EKG None  Radiology Dg Chest 2 View  Result Date: 02/17/2018 CLINICAL DATA:  Cough and shortness of breath EXAM: CHEST - 2 VIEW COMPARISON:  January 04, 2018 FINDINGS: There  is no edema or consolidation. Heart is upper normal in size with pulmonary vascularity within normal limits. There is aortic atherosclerosis. No adenopathy. There is lower thoracic levoscoliosis with upper lumbar dextroscoliosis. IMPRESSION: Aortic atherosclerosis.  No edema or consolidation. Aortic Atherosclerosis (ICD10-I70.0). Electronically Signed   By: Lowella Grip III M.D.   On: 02/17/2018 11:01    Procedures Procedures (including critical care time)  Medications Ordered in ED Medications - No data to display   Initial Impression / Assessment and Plan / ED Course  I have reviewed the triage vital signs and the nursing notes.  Pertinent labs & imaging results that were available during my care of the patient were reviewed by me and considered in my medical decision making (see chart for details).      Final Clinical Impressions(s) / ED Diagnoses   Final diagnoses:  Asthma due to environmental allergies    Labs:   Imaging: DG chest 2 view  Consults:  Therapeutics:  Discharge Meds:   Assessment/Plan: 84 year old female presents today with reported exposure to dust.  She does report symptoms that would be consistent with allergies.  She has no signs of infectious etiology.  She is been on antibiotics, and had numerous visits to her primary care with no acute findings.  I see no acute abnormalities here today that would require further evaluation or management.  I referred her back to her primary care for  ongoing management, she is given strict return precautions both her and her son verbalized understanding and agreement to today's plan had no further questions or concerns.      ED Discharge Orders    None       Francee Gentile 02/17/18 [place], Wenda Overland, MD 02/18/18 [phone]

## 2018-02-17 NOTE — Discharge Instructions (Addendum)
Please read attached information. If you experience any new or worsening signs or symptoms please return to the emergency room for evaluation. Please follow-up with your primary care provider or specialist as discussed.  °

## 2018-02-18 ENCOUNTER — Ambulatory Visit: Payer: Medicare HMO | Admitting: Family Medicine

## 2018-02-18 NOTE — Telephone Encounter (Signed)
Pt has app on 4/1 note made to check meds.

## 2018-02-19 ENCOUNTER — Telehealth: Payer: Self-pay | Admitting: Family Medicine

## 2018-02-19 ENCOUNTER — Ambulatory Visit: Payer: Self-pay

## 2018-02-19 NOTE — Telephone Encounter (Signed)
See Triage Encounter to chart if patient returns call.  Left VM to return call.

## 2018-02-19 NOTE — Telephone Encounter (Signed)
Patient called, left VM to return the call to discuss her symptoms of sore throat and hoarseness.

## 2018-02-19 NOTE — Telephone Encounter (Signed)
Copied from [place] [id number]. Topic: Quick Communication - Rx Refill/Question >> Feb 19, 2018  3:37 PM Neva Seat wrote: Pt needing something for her hoarse & sore throat.   [place], [address] [place] [address] Phone: [phone] Fax: [phone]

## 2018-02-22 ENCOUNTER — Encounter: Payer: Self-pay | Admitting: Family Medicine

## 2018-02-22 ENCOUNTER — Ambulatory Visit (INDEPENDENT_AMBULATORY_CARE_PROVIDER_SITE_OTHER): Payer: Medicare HMO | Admitting: Family Medicine

## 2018-02-22 VITALS — BP 120/66 | HR 81 | Temp 98.5°F | Ht 59.0 in | Wt 118.6 lb

## 2018-02-22 DIAGNOSIS — J320 Chronic maxillary sinusitis: Secondary | ICD-10-CM | POA: Diagnosis not present

## 2018-02-22 DIAGNOSIS — J3089 Other allergic rhinitis: Secondary | ICD-10-CM

## 2018-02-22 DIAGNOSIS — N183 Chronic kidney disease, stage 3 unspecified: Secondary | ICD-10-CM

## 2018-02-22 DIAGNOSIS — R0602 Shortness of breath: Secondary | ICD-10-CM

## 2018-02-22 DIAGNOSIS — E1122 Type 2 diabetes mellitus with diabetic chronic kidney disease: Secondary | ICD-10-CM | POA: Diagnosis not present

## 2018-02-22 MED ORDER — CEFDINIR 300 MG PO CAPS: 300.0000 mg | ORAL_CAPSULE | Freq: Two times a day (BID) | ORAL | 0 refills | Status: DC

## 2018-02-22 NOTE — Progress Notes (Signed)
Kendel Pesnell is a 84 y.o. female is here for follow up.  History of Present Illness:   HPI: Recent ER visit reviewed. Disposition as follows:  Assessment/Plan: 84 year old female presents today with reported exposure to dust.  She does report symptoms that would be consistent with allergies.  She has no signs of infectious etiology.  She is been on antibiotics, and had numerous visits to her primary care with no acute findings.  I see no acute abnormalities here today that would require further evaluation or management.  I referred her back to her primary care for ongoing management, she is given strict return precautions both her and her son verbalized understanding and agreement to today's plan had no further questions or concerns.  Patient states that she is very congested and still having shortness of breath.  She says that they did find more dirt and dust in the bends in her apartment.  They will be moving her tomorrow.  She does not feel that any of her current medications are helping.  She says that she cannot even hear well due to the nasal congestion.  Health Maintenance Due  Topic Date Due  . OPHTHALMOLOGY EXAM  08/18/2017   Depression screen [facility] 2/9 08/12/2017 05/18/2017 05/15/2017  Decreased Interest 2 2 3   Down, Depressed, Hopeless 2 2 3   PHQ - 2 Score 4 4 6   Altered sleeping 3 3 3   Tired, decreased energy 3 3 3   Change in appetite 1 2 2   Feeling bad or failure about yourself  2 2 3   Trouble concentrating 3 3 3   Moving slowly or fidgety/restless - 1 1  Suicidal thoughts 0 0 0  PHQ-9 Score 16 18 21   Difficult doing work/chores Very difficult Very difficult Very difficult   PMHx, SurgHx, SocialHx, FamHx, Medications, and Allergies were reviewed in the Visit Navigator and updated as appropriate.   Patient Active Problem List   Diagnosis Date Noted  . Depression, recurrent ([place]), unhappy in [place] but feels stuck, declines medication or counseling 01/24/2018  . At risk for  polypharmacy, due to vitamin overuse, working with patient to "deprescribe" 01/24/2018  . Constipation, uses Miralax prn 01/24/2018  . Diabetic peripheral neuropathy associated with type 2 diabetes mellitus ([place]), on low dose Neurontin 01/24/2018  . Aortic atherosclerosis ([place]), 2018 CXR 01/24/2018  . Lumbar spine scoliosis 01/24/2018  . Hyperlipidemia associated with type 2 diabetes mellitus ([place]), on statin 01/24/2018  . IBS (irritable bowel syndrome) 01/24/2018  . Uncontrolled type 2 diabetes mellitus with hyperglycemia, with long-term current use of insulin ([place]) 12/02/2017  . Insomnia 06/06/2017  . CKD stage 3 secondary to diabetes ([place]), declines ACE/ARB 05/26/2017  . Osteoporosis, post-menopausal 02/06/2016  . Bilateral hearing loss, refuses hearing aids or further testing 02/06/2016  . Arthritis, takes Tumeric  02/06/2016  . Anemia of chronic disease 12/05/2015  . Protein-calorie malnutrition, weight stable 11/13/2015  . Chronic pancreatitis ([place]) 11/13/2015  . GERD (gastroesophageal reflux disease), on Nexium 11/13/2015  . Hypertension associated with diabetes ([place]), on Lisinopril 11/13/2015  . Allergic rhinitis, Rx Xyzal, flonase, atrovent and has been offered referral to Allergy 11/13/2015   Social History   Tobacco Use  . Smoking status: Never Smoker  . Smokeless tobacco: Never Used  Substance Use Topics  . Alcohol use: No  . Drug use: No   Current Medications and Allergies:   .  acetaminophen (TYLENOL) 325 MG tablet, Take 2 tablets (650 mg total) by mouth every 6 (six) hours as needed for  moderate pain or headache., Disp: , Rfl:  .  albuterol (PROVENTIL HFA;VENTOLIN HFA) 108 (90 Base) MCG/ACT inhaler, Inhale 2 puffs into the lungs every 6 (six) hours as needed for wheezing or shortness of breath. (Patient not taking: Reported on 01/19/2018), Disp: 1 Inhaler, Rfl: 2 .  Alcohol Swabs (B-D SINGLE USE SWABS REGULAR) PADS, USE TO CLEAN FINGER BEFORE CHECKING BLOOD SUGARS AND  CLEAN SKIN BEFORE INSULIN INJECTIONS., Disp: 300 each, Rfl: 2 .  Alpha-D-Galactosidase (BEANO PO), Take by mouth as needed., Disp: , Rfl:  .  amoxicillin-clavulanate (AUGMENTIN) 875-125 MG tablet, Take 1 tablet by mouth 2 (two) times daily., Disp: 20 tablet, Rfl: 0 .  Ascorbic Acid (VITAMIN C) 1000 MG tablet, Take 1,000 mg by mouth daily., Disp: , Rfl:  .  aspirin EC 81 MG tablet, Take 81 mg by mouth daily., Disp: , Rfl:  .  benzonatate (TESSALON PERLES) 100 MG capsule, Take 1 capsule (100 mg total) by mouth 3 (three) times daily as needed for cough., Disp: 30 capsule, Rfl: 1 .  budesonide-formoterol (SYMBICORT) 160-4.5 MCG/ACT inhaler, Inhale 2 puffs into the lungs 2 (two) times daily., Disp: 1 Inhaler, Rfl: 3 .  calcium carbonate (TUMS - DOSED IN MG ELEMENTAL CALCIUM) 500 MG chewable tablet, Chew 1 tablet by mouth daily., Disp: , Rfl:  .  cefdinir (OMNICEF) 300 MG capsule, Take 1 capsule (300 mg total) by mouth 2 (two) times daily., Disp: 20 capsule, Rfl: 0 .  esomeprazole (NEXIUM) 40 MG capsule, Take 1 capsule (40 mg total) by mouth daily., Disp: 30 capsule, Rfl: 0 .  feeding supplement, GLUCERNA SHAKE, (GLUCERNA SHAKE) LIQD, Take 237 mLs by mouth 3 (three) times daily between meals., Disp: , Rfl: 0 .  fluticasone (FLONASE) 50 MCG/ACT nasal spray, Place 2 sprays into both nostrils daily., Disp: 16 g, Rfl: 0 .  gabapentin (NEURONTIN) 100 MG capsule, Take 1 capsule (100 mg total) by mouth at bedtime., Disp: 30 capsule, Rfl: 3 .  glucose blood (ACCU-CHEK GUIDE) test strip, Use to test blood sugar twice daily, Disp: 100 each, Rfl: 2 .  guaiFENesin-codeine (CHERATUSSIN AC) 100-10 MG/5ML syrup, Take 10 mLs by mouth at bedtime as needed for cough or congestion., Disp: 120 mL, Rfl: 0 .  hydrOXYzine (ATARAX/VISTARIL) 10 MG tablet, Take 1 tablet (10 mg total) by mouth daily as needed for anxiety., Disp: 30 tablet, Rfl: 0 .  insulin aspart (NOVOLOG FLEXPEN) 100 UNIT/ML FlexPen, Inject 3 units before  breakfast and 4 units before supper (Patient taking differently: Inject 6 units before breakfast and 12 units before supper), Disp: 5 pen, Rfl: 4 .  insulin degludec (TRESIBA) 100 UNIT/ML SOPN FlexTouch Pen, Inject 6 units into the skin once daily (Patient taking differently: 10 Units. Inject 8 units into the skin once daily), Disp: 18 pen, Rfl: 1 .  Insulin Pen Needle (BD PEN NEEDLE NANO U/F) 32G X 4 MM MISC, USE TO INJECT INSULINS 4 TIMES DAILY., Disp: 100 each, Rfl: 0 .  ipratropium (ATROVENT) 0.03 % nasal spray, Place 2 sprays into both nostrils every 12 (twelve) hours., Disp: 30 mL, Rfl: 12 .  isosorbide mononitrate (IMDUR) 30 MG 24 hr tablet, Take 1 tablet (30 mg total) by mouth daily. One tablet daily, Disp: 30 tablet, Rfl: 0 .  levocetirizine (XYZAL) 5 MG tablet, Take 5 mg by mouth every evening., Disp: , Rfl:  .  lovastatin (MEVACOR) 20 MG tablet, Take 1 tablet (20 mg total) by mouth at bedtime., Disp: 90 tablet, Rfl: 3 .  magnesium oxide (MAG-OX) 400 MG tablet, Take 400 mg by mouth daily as needed., Disp: , Rfl:  .  metoprolol tartrate (LOPRESSOR) 25 MG tablet, Take 1 tablet (25 mg total) by mouth 2 (two) times daily., Disp: 180 tablet, Rfl: 2 .  montelukast (SINGULAIR) 10 MG tablet, Take 1 tablet (10 mg total) by mouth at bedtime., Disp: 30 tablet, Rfl: 3 .  Multiple Vitamins-Minerals (CENTRUM SILVER 50+WOMEN) TABS, Take 1 tablet by mouth daily., Disp: , Rfl:  .  Multiple Vitamins-Minerals (HAIR/SKIN/NAILS/BIOTIN PO), Take 1 tablet by mouth daily., Disp: , Rfl:  .  multivitamin-lutein (OCUVITE-LUTEIN) CAPS capsule, Take 1 capsule by mouth daily., Disp: , Rfl:  .  nitroGLYCERIN (NITROSTAT) 0.3 MG SL tablet, Place 0.3 mg under the tongue every 5 (five) minutes as needed for chest pain., Disp: , Rfl:  .  Omega-3 Fatty Acids (ULTRA OMEGA-3 FISH OIL PO), Take 1 capsule by mouth daily., Disp: , Rfl:  .  polyethylene glycol (MIRALAX / GLYCOLAX) packet, Take 17 g by mouth daily as needed., Disp: ,  Rfl:  .  Probiotic Product (PROBIOTIC FORMULA) CAPS, Take 1 capsule by mouth daily., Disp: , Rfl:  .  TRUEPLUS LANCETS 28G MISC, CHECK BLOOD SUGAR ONCE A DAY., Disp: 100 each, Rfl: 0 .  Turmeric Curcumin 500 MG CAPS, Take 1 capsule by mouth daily., Disp: , Rfl:   No Known Allergies   Review of Systems   Pertinent items are noted in the HPI. Otherwise, ROS is negative.  Vitals:   Vitals:   02/22/18 1251  BP: 120/66  Pulse: 81  Temp: 98.5 F (36.9 C)  TempSrc: Oral  SpO2: 98%  Weight: 118 lb 9.6 oz (53.8 kg)  Height: 4\' 11"  (1.499 m)     Body mass index is 23.95 kg/m.   Physical Exam:   Physical Exam  Constitutional: She appears well-nourished.  HENT:  Head: Normocephalic and atraumatic.  Eyes: Pupils are equal, round, and reactive to light. EOM are normal.  Neck: Normal range of motion. Neck supple.  Cardiovascular: Normal rate, regular rhythm, normal heart sounds and intact distal pulses.  Pulmonary/Chest: Effort normal.  Abdominal: Soft.  Skin: Skin is warm.  Psychiatric: She has a normal mood and affect. Her behavior is normal.  Nursing note and vitals reviewed.   Results for orders placed or performed in visit on 12/30/17  POCT glycosylated hemoglobin (Hb A1C)  Result Value Ref Range   Hemoglobin A1C 7.5    Assessment and Plan:   Diagnoses and all orders for this visit:  Allergic rhinitis due to other allergic trigger, unspecified seasonality  CKD stage 3 secondary to diabetes ([place]), declines ACE/ARB  Shortness of breath -     Ambulatory referral to Pulmonology -     cefdinir (OMNICEF) 300 MG capsule; Take 1 capsule (300 mg total) by mouth 2 (two) times daily.  Chronic maxillary sinusitis -     cefdinir (OMNICEF) 300 MG capsule; Take 1 capsule (300 mg total) by mouth 2 (two) times daily.   . Reviewed expectations re: course of current medical issues. . Discussed self-management of symptoms. . Outlined signs and symptoms indicating need for more  acute intervention. . Patient verbalized understanding and all questions were answered. Marland Kitchen Health Maintenance issues including appropriate healthy diet, exercise, and smoking avoidance were discussed with patient. . See orders for this visit as documented in the electronic medical record. . Patient received an After Visit Summary.  Briscoe Deutscher, DO [place], [place] 02/22/2018  No  future appointments.

## 2018-02-23 ENCOUNTER — Other Ambulatory Visit: Payer: Self-pay

## 2018-02-23 MED ORDER — LISINOPRIL 5 MG PO TABS: 5.0000 mg | ORAL_TABLET | Freq: Every day | ORAL | 1 refills | Status: DC

## 2018-02-24 ENCOUNTER — Telehealth: Payer: Self-pay

## 2018-02-24 NOTE — Telephone Encounter (Signed)
Copied from [place] [id number]. Topic: General - Other >> Feb 24, 2018 11:28 AM Yvette Rack wrote: Reason for CRM: patient son Alaney Witter is her power of attorney wanting for a call back at [phone] he want to know what the provider diagnosed her with or her recent findings he would also like a copy of her office notes for a few months ago his e-mail is [email or web address]

## 2018-02-24 NOTE — Telephone Encounter (Signed)
Called and spoke with patients son. I explained to him that due to not having Power of [place] or him on the DPR I could not go into any details about his mother. He was very understanding and stated that he would fax in the [place]. I told him that when he came in to visit his mother that we could schedule an appointment to discuss health care with Dr. Juleen China. I also explained to the patient that he would need to go through Medical records to receive her medical records once we have the Power of attorney.

## 2018-02-25 ENCOUNTER — Telehealth: Payer: Self-pay | Admitting: Endocrinology

## 2018-02-25 ENCOUNTER — Other Ambulatory Visit: Payer: Self-pay

## 2018-02-25 MED ORDER — GLUCOSE BLOOD VI STRP: ORAL_STRIP | 2 refills | Status: DC

## 2018-02-25 NOTE — Telephone Encounter (Signed)
Patient states that she spoke with [facility] and they were requesting for refills.  Patient says they have been trying to reach [place] with no answer.  They are sending it over for  New meter, test strips, and needles.  She would like to check the status of this.  Please advise

## 2018-02-26 ENCOUNTER — Other Ambulatory Visit: Payer: Self-pay

## 2018-02-26 ENCOUNTER — Telehealth: Payer: Self-pay

## 2018-02-26 MED ORDER — GLUCOSE BLOOD VI STRP: ORAL_STRIP | 2 refills | Status: DC

## 2018-02-26 MED ORDER — ACCU-CHEK GUIDE W/DEVICE KIT: 1.0000 | PACK | 1 refills | Status: DC

## 2018-02-26 NOTE — Telephone Encounter (Signed)
Left message for patient to call back  

## 2018-02-26 NOTE — Telephone Encounter (Signed)
This was sent to [place] today

## 2018-03-01 ENCOUNTER — Telehealth: Payer: Self-pay

## 2018-03-01 ENCOUNTER — Other Ambulatory Visit: Payer: Self-pay

## 2018-03-01 MED ORDER — ISOSORBIDE MONONITRATE ER 30 MG PO TB24: 30.0000 mg | ORAL_TABLET | Freq: Every day | ORAL | 1 refills | Status: DC

## 2018-03-01 NOTE — Telephone Encounter (Signed)
Pt returned call from Friday. Pt was informed that medications were sent to [place] as requested.

## 2018-03-02 ENCOUNTER — Ambulatory Visit: Payer: Self-pay

## 2018-03-02 NOTE — Telephone Encounter (Signed)
Pt. Is calling because she wants Dr. Juleen China to get her an appointment with pulmonary sooner than 03/29/18. "My breathing is the same." Answer Assessment - Initial Assessment Questions 1. ONSET: "When did the cough begin?"      A long time ago 2. SEVERITY: "How bad is the cough today?"      Mild 3. RESPIRATORY DISTRESS: "Describe your breathing."      Short of breath at times 4. FEVER: "Do you have a fever?" If so, ask: "What is your temperature, how was it measured, and when did it start?"     No 5. HEMOPTYSIS: "Are you coughing up any blood?" If so ask: "How much?" (flecks, streaks, tablespoons, etc.)     No 6. TREATMENT: "What have you done so far to treat the cough?" (e.g., meds, fluids, humidifier)     Nothing is helping 7. CARDIAC HISTORY: "Do you have any history of heart disease?" (e.g., heart attack, congestive heart failure)      Yes 8. LUNG HISTORY: "Do you have any history of lung disease?"  (e.g., pulmonary embolus, asthma, emphysema)     No 9. PE RISK FACTORS: "Do you have a history of blood clots?" (or: recent major surgery, recent prolonged travel, bedridden )     No 10. OTHER SYMPTOMS: "Do you have any other symptoms? (e.g., runny nose, wheezing, chest pain)       No 11. PREGNANCY: "Is there any chance you are pregnant?" "When was your last menstrual period?"       No 12. TRAVEL: "Have you traveled out of the country in the last month?" (e.g., travel history, exposures)       No  Protocols used: COUGH - ACUTE NON-PRODUCTIVE-A-AH

## 2018-03-02 NOTE — Telephone Encounter (Signed)
Spoke with patient and let her know that with it being another office we can't get her a sooner appointment. That would be the first new patient appointment that they have. Tried to call the office to put patient on wait list. I was not able to get through to office.

## 2018-03-02 NOTE — Telephone Encounter (Signed)
See note

## 2018-03-03 ENCOUNTER — Telehealth: Payer: Self-pay

## 2018-03-03 NOTE — Telephone Encounter (Signed)
Copied from [place] [id number]. Topic: General - Other >> Feb 24, 2018 11:28 AM Yvette Rack wrote: Reason for CRM: patient son Romaine Maciolek is her power of attorney wanting for a call back at [phone] he want to know what the provider diagnosed her with or her recent findings he would also like a copy of her office notes for a few months ago his e-mail is [email or web address]  >> Mar 03, 2018 11:17 AM Boyd Kerbs wrote: [name], son is Calling to confirm office has received the power of attorney papers, ROI , health care surrogate   Please call him back

## 2018-03-05 NOTE — Telephone Encounter (Signed)
Spoke with patients son and discussed mother office visits. I have scheduled the patient to come in with son when he is in town.

## 2018-03-09 ENCOUNTER — Other Ambulatory Visit: Payer: Self-pay | Admitting: Endocrinology

## 2018-03-09 ENCOUNTER — Other Ambulatory Visit: Payer: Self-pay | Admitting: Family Medicine

## 2018-03-10 ENCOUNTER — Other Ambulatory Visit: Payer: Self-pay | Admitting: Endocrinology

## 2018-03-10 NOTE — Telephone Encounter (Signed)
MEDICATION:   PHARMACY:    IS THIS A 90 DAY SUPPLY :   IS PATIENT OUT OF MEDICATION:   IF NOT; HOW MUCH IS LEFT:   LAST APPOINTMENT DATE: @4 /08/2018  NEXT APPOINTMENT DATE:@5 /01/2018  OTHER COMMENTS:    **Let patient know to contact pharmacy at the end of the day to make sure medication is ready. **  ** Please notify patient to allow 48-72 hours to process**  **Encourage patient to contact the pharmacy for refills or they can request refills through [facility]**

## 2018-03-10 NOTE — Telephone Encounter (Signed)
rec fax from pharmacy stating pt is now checking CBGs 3-4 times daily. They are requesting a new Rx with these directions. Rx sent. See meds.

## 2018-03-15 ENCOUNTER — Other Ambulatory Visit: Payer: Self-pay | Admitting: *Deleted

## 2018-03-15 ENCOUNTER — Telehealth: Payer: Self-pay | Admitting: Internal Medicine

## 2018-03-15 ENCOUNTER — Encounter: Payer: Self-pay | Admitting: Endocrinology

## 2018-03-15 MED ORDER — INSULIN ASPART 100 UNIT/ML FLEXPEN: PEN_INJECTOR | SUBCUTANEOUS | 4 refills | Status: DC

## 2018-03-15 MED ORDER — INSULIN DEGLUDEC 100 UNIT/ML ~~LOC~~ SOPN: PEN_INJECTOR | SUBCUTANEOUS | 1 refills | Status: DC

## 2018-03-15 NOTE — Telephone Encounter (Signed)
Please work with Vanessa Gibson how to fit patient in for 03/16/18 AM  (PM is ILD clinic)

## 2018-03-15 NOTE — Telephone Encounter (Signed)
Attempted to call pt to see if I could arrange for her to come in for a visit with MR but no answer.  Left message for pt to call our office and stated in the message to ask to speak to me directly so I could get an appt scheduled for her.

## 2018-03-15 NOTE — Telephone Encounter (Signed)
Raquel Sarna can you please assist with this? The ILD clinic is full. Thanks.

## 2018-03-15 NOTE — Telephone Encounter (Signed)
Spoke with patient. She currently has an appt as a consult with MR on 5/6. She has been having SOB and right sided back pain for the past 5 months. Over the past weekend, the SOB has increased. She is not able to do much and wants to know if she could be seen sooner. She also stated that she will soon be out of her Symbicort 160.   Looked at [employer] schedule, no one has any openings for a new consult.   MR, you do have two 15 mins slots for tomorrow 03/16/18. Please advise if you are willing to work in the patient. Thanks!   Patient also stated that if she does not answer the phone, it is ok to leave a message on her VM.

## 2018-03-17 ENCOUNTER — Encounter: Payer: Self-pay | Admitting: Endocrinology

## 2018-03-17 ENCOUNTER — Ambulatory Visit: Payer: Medicare HMO | Admitting: Endocrinology

## 2018-03-17 VITALS — BP 148/92 | HR 81 | Ht 59.0 in | Wt 119.0 lb

## 2018-03-17 DIAGNOSIS — E1165 Type 2 diabetes mellitus with hyperglycemia: Secondary | ICD-10-CM | POA: Diagnosis not present

## 2018-03-17 DIAGNOSIS — Z794 Long term (current) use of insulin: Secondary | ICD-10-CM | POA: Diagnosis not present

## 2018-03-17 LAB — BASIC METABOLIC PANEL
BUN: 29 mg/dL — ABNORMAL HIGH (ref 6–23)
CALCIUM: 9.9 mg/dL (ref 8.4–10.5)
CO2: 28 mEq/L (ref 19–32)
CREATININE: 1.18 mg/dL (ref 0.40–1.20)
Chloride: 106 mEq/L (ref 96–112)
GFR: 46.01 mL/min — AB (ref >60.00)
GLUCOSE: 62 mg/dL — AB (ref 70–99)
Potassium: 4.4 mEq/L (ref 3.5–5.1)
SODIUM: 140 meq/L (ref 135–145)

## 2018-03-17 LAB — LIPID PANEL
CHOLESTEROL: 146 mg/dL (ref 0–200)
HDL: 52.3 mg/dL (ref >39.00)
LDL CALC: 71 mg/dL (ref 0–99)
NONHDL: 93.74
Total CHOL/HDL Ratio: 3
Triglycerides: 114 mg/dL (ref 0.0–149.0)
VLDL: 22.8 mg/dL (ref 0.0–40.0)

## 2018-03-17 NOTE — Patient Instructions (Signed)
Get Gabapentin upto 3 x daily for tingling

## 2018-03-17 NOTE — Telephone Encounter (Signed)
ATC pt, no answer. Left message for pt to call back.  

## 2018-03-17 NOTE — Progress Notes (Signed)
Patient ID: Vanessa Gibson, female   DOB: [birth year], 84 y.o.   MRN: [id number]           Reason for Appointment: Follow-up for Type 2 Diabetes  Referring physician: Drema Dallas   History of Present Illness:          Date of diagnosis of type 2 diabetes mellitus: 1997        Background history:   She had been treated with metformin mostly since her diagnosis At some point was given glipizide/metformin combination, detailed history is not available from PCP Also not clear what level of control she has had until recently  Recent history:   Non-insulin hypoglycemic drugs the patient is taking are: none   INSULIN dose: Tresiba 20 units at night,  Novolog 6 units at breakfast and 6-8 at supper  Her last A1c was 7.5   Current management, blood sugar patterns and problems identified:   She was last seen in 2/19 when she was taking her Tyler Aas and smaller doses twice a day instead of once a day and was confused between the 2 insulin regimens  However he will know she was told to take 18 units of Tresiba she called back and complained about her sugars being higher In the morning; is now taking 20 units  Blood sugars are not low overnight with this regimen  Also she was told to take relatively larger dose of insulin at suppertime and she will also take 8 units if the blood sugar is high when she is eating  No hypoglycemia during the day and she is mostly checking blood sugars fasting with some readings mid afternoon and after supper  Only occasionally will have a blood sugar over 200 after lunch but she is usually eating a small meal such as a yogurt or a salad  Patient is not focused on her diabetes currently because she is more concerned about her lung problems  Otherwise she seems to be overly concerned about mild increase in blood sugars at times  Again asking about switching to an oral medication instead of insulin  Difficult to get a complete history today as she is talking about  her other problems  Her weight has come up somewhat more        Side effects from medications have been: ?  Nausea from metformin  Compliance with the medical regimen: Fairly good  Hypoglycemia: None    Glucose monitoring:  done 1-2 times a day         Glucometer:  Accu-Chek  Blood Glucose readings  by download  Mean values apply above for all meters except median for One Touch  PRE-MEAL Fasting Lunch Dinner Bedtime Overall  Glucose range:  88-192      Mean/median:  133    148   POST-MEAL PC Breakfast PC Lunch PC Dinner  Glucose range:   3-234  92-179  Mean/median:   154  156     Self-care: The diet that the patient has been following is: tries to limit sweets  She has her meals with assisted living facility     Typical meal intake: Breakfast is cereal, sometimes bacon. Lunch yogurt, salad or other light meal usually           Dietician visit, most recent: Never CDE visit: 1/18               Exercise:  Less recently  Weight history: Previous range 98-140   Wt Readings from Last 3 Encounters:  03/17/18 119 lb (54 kg)  02/22/18 118 lb 9.6 oz (53.8 kg)  01/19/18 115 lb 12.8 oz (52.5 kg)    Glycemic control:  A1c in October 2017 = 8.5    Lab Results  Component Value Date   HGBA1C 7.5 12/30/2017   HGBA1C 6.8 09/21/2017   HGBA1C 8.9 (H) 03/30/2017   Lab Results  Component Value Date   MICROALBUR <0.7 11/14/2016   LDLCALC 90 02/02/2017   CREATININE 1.24 (H) 05/25/2017   Lab Results  Component Value Date   MICRALBCREAT 1.8 11/14/2016       Allergies as of 03/17/2018   No Known Allergies     Medication List        Accurate as of 03/17/18 10:19 AM. Always use your most recent med list.          ACCU-CHEK GUIDE w/Device Kit 1 Device by Does not apply route as directed.   acetaminophen 325 MG tablet Commonly known as:  TYLENOL Take 2 tablets (650 mg total) by mouth every 6 (six) hours as needed for moderate pain or headache.   albuterol 108 (90  Base) MCG/ACT inhaler Commonly known as:  PROVENTIL HFA;VENTOLIN HFA Inhale 2 puffs into the lungs every 6 (six) hours as needed for wheezing or shortness of breath.   aspirin EC 81 MG tablet Take 81 mg by mouth daily.   B-D SINGLE USE SWABS REGULAR Pads USE TO CLEAN FINGER BEFORE CHECKING BLOOD SUGARS AND CLEAN SKIN BEFORE INSULIN INJECTIONS.   BEANO PO Take by mouth as needed.   budesonide-formoterol 160-4.5 MCG/ACT inhaler Commonly known as:  SYMBICORT Inhale 2 puffs into the lungs 2 (two) times daily.   calcium carbonate 500 MG chewable tablet Commonly known as:  TUMS - dosed in mg elemental calcium Chew 1 tablet by mouth daily.   cefdinir 300 MG capsule Commonly known as:  OMNICEF Take 1 capsule (300 mg total) by mouth 2 (two) times daily.   esomeprazole 40 MG capsule Commonly known as:  NEXIUM Take 1 capsule (40 mg total) by mouth daily.   feeding supplement (GLUCERNA SHAKE) Liqd Take 237 mLs by mouth 3 (three) times daily between meals.   fexofenadine 180 MG tablet Commonly known as:  ALLEGRA Take 180 mg by mouth daily.   fluticasone 50 MCG/ACT nasal spray Commonly known as:  FLONASE Place 2 sprays into both nostrils daily.   gabapentin 100 MG capsule Commonly known as:  NEURONTIN Take 1 capsule (100 mg total) by mouth at bedtime.   glucose blood test strip Commonly known as:  ACCU-CHEK GUIDE Use to test blood sugar 3-4 times daily   hydrOXYzine 10 MG tablet Commonly known as:  ATARAX/VISTARIL Take 1 tablet (10 mg total) by mouth daily as needed for anxiety.   hydrOXYzine 10 MG tablet Commonly known as:  ATARAX/VISTARIL TAKE ONE TABLET ONCE DAILY AS NEEDED FOR ANXIETY.   insulin aspart 100 UNIT/ML FlexPen Commonly known as:  NOVOLOG FLEXPEN Inject 3 units before breakfast and 4 units before supper   insulin degludec 100 UNIT/ML Sopn FlexTouch Pen Commonly known as:  TRESIBA Inject 6 units into the skin once daily   Insulin Pen Needle 32G X 4 MM  Misc Commonly known as:  BD PEN NEEDLE NANO U/F USE TO INJECT INSULINS 4 TIMES DAILY.   ipratropium 0.03 % nasal spray Commonly known as:  ATROVENT Place 2 sprays into both nostrils every 12 (twelve) hours.   isosorbide mononitrate 30 MG 24 hr tablet Commonly known as:  IMDUR Take 1 tablet (30 mg total) by mouth daily. One tablet daily   levocetirizine 5 MG tablet Commonly known as:  XYZAL Take 5 mg by mouth every evening.   lisinopril 5 MG tablet Commonly known as:  PRINIVIL,ZESTRIL Take 1 tablet (5 mg total) by mouth daily.   lovastatin 20 MG tablet Commonly known as:  MEVACOR Take 1 tablet (20 mg total) by mouth at bedtime.   magnesium oxide 400 MG tablet Commonly known as:  MAG-OX Take 400 mg by mouth daily as needed.   metoprolol tartrate 25 MG tablet Commonly known as:  LOPRESSOR Take 1 tablet (25 mg total) by mouth 2 (two) times daily.   montelukast 10 MG tablet Commonly known as:  SINGULAIR Take 1 tablet (10 mg total) by mouth at bedtime.   multivitamin-lutein Caps capsule Take 1 capsule by mouth daily.   HAIR/SKIN/NAILS/BIOTIN PO Take 1 tablet by mouth daily.   CENTRUM SILVER 50+WOMEN Tabs Take 1 tablet by mouth daily.   nitroGLYCERIN 0.3 MG SL tablet Commonly known as:  NITROSTAT Place 0.3 mg under the tongue every 5 (five) minutes as needed for chest pain.   polyethylene glycol packet Commonly known as:  MIRALAX / GLYCOLAX Take 17 g by mouth daily as needed.   PROBIOTIC FORMULA Caps Take 1 capsule by mouth daily.   TRUEPLUS LANCETS 28G Misc Check blood sugar 3-4 times daily. Dx:   Turmeric Curcumin 500 MG Caps Take 1 capsule by mouth daily.   ULTRA OMEGA-3 FISH OIL PO Take 1 capsule by mouth daily.   vitamin C 1000 MG tablet Take 1,000 mg by mouth daily.       Allergies: No Known Allergies  Past Medical History:  Diagnosis Date  . Anxiety   . Arthritis   . Breast cancer ([place]), bilateral   . Chronic pancreatitis ([place])   .  Depression   . Dizziness   . GERD (gastroesophageal reflux disease)   . History of hiatal hernia   . Hyperlipemia   . Hypertension   . IBS (irritable bowel syndrome)   . Mandibular fracture ([place]) 12/04/2015  . MI (myocardial infarction) ([place])   . Skin cancer of face     Past Surgical History:  Procedure Laterality Date  . BREAST SURGERY    . CATARACT EXTRACTION W/ INTRAOCULAR LENS  IMPLANT, BILATERAL Bilateral   . CHOLECYSTECTOMY OPEN  1970's  . MASTECTOMY Bilateral     No family history on file.  Social History:  reports that she has never smoked. She has never used smokeless tobacco. She reports that she does not drink alcohol or use drugs.   Review of Systems   Lipid history:  She is on lovastatin 20 mg from her PCP With the following results    Lab Results  Component Value Date   CHOL 163 02/02/2017   HDL 48.70 02/02/2017   LDLCALC 90 02/02/2017   TRIG 120.0 02/02/2017   CHOLHDL 3 02/02/2017           Hypertension: Has only minimal hypertension She is on metoprolol 25 mg, Has history of CAD  HYPERKALEMIA: She has not had any recurrence of this   Lab Results  Component Value Date   CREATININE 1.24 (H) 05/25/2017   BUN 33 (H) 05/25/2017   NA 138 05/25/2017   K 4.9 05/25/2017   CL 104 05/25/2017   CO2 27 05/25/2017    Most recent eye exam was 07/2016, reportedly no retinopathy  Most recent foot exam: 11/05/17  She is complaining of  tingling and discomfort in her hands and feet   Physical Examination:  BP (!) 148/92 (BP Location: Right Arm, Patient Position: Sitting, Cuff Size: Normal)   Pulse 81   Ht _0  (1.499 m)   Wt 119 lb (54 kg)   SpO2 98%   BMI 24.04 kg/m        ASSESSMENT:  Diabetes type 2, uncontrolled, nonobese, insulin-requiring  See history of present illness for detailed discussion of current diabetes management, blood sugar patterns and problems identified   She is on basal bolus insulin regimen with failure to respond  to oral agents  Last A1c was 7.5 and this is excellent considering her age More recently her blood sugars look very good on her monitor download She has only sporadic high readings overnight or after 1 of her meals including lunch Usually getting by with mealtime insulin only at breakfast and suppertime Fasting readings are fairly good without relatively low sugars with 20 units of Tresiba and can continue She has been getting some help with patient assistance program for her insulin  My neuropathy: Has some symptoms, more in her hands now  History of hyperkalemia:   needs follow-up  PLAN:   No change in insulin, reviewed timing of insulin and glucose monitoring  Again discussed that for her neuropathy symptoms she can take gabapentin twice a day and she will need to ask for a refill from her PCP   There are no Patient Instructions on file for this visit.       Elayne Snare 03/17/2018, 10:19 AM   Note: This office note was prepared with Dragon voice recognition system technology. Any transcriptional errors that result from this process are unintentional.

## 2018-03-17 NOTE — Telephone Encounter (Signed)
Per Vanessa Gibson, pt stated to her she could come Friday, 4/26 for the appt with MR.  Changed pt's consult from previously scheduled consult with MR in May to Friday, 4/26 at 11am.  Nothing further needed at this time.

## 2018-03-17 NOTE — Telephone Encounter (Signed)
Pt returned call.   I asked her if she could come in this Friday for an appt at 11:00.  Pt stated to me she was going to check to see if she could get a ride and then would call me back to let me know.  Will await a return call from pt before scheduling appt for pt.

## 2018-03-18 ENCOUNTER — Other Ambulatory Visit: Payer: Self-pay | Admitting: Endocrinology

## 2018-03-19 ENCOUNTER — Ambulatory Visit: Payer: Medicare HMO | Admitting: Internal Medicine

## 2018-03-19 ENCOUNTER — Encounter: Payer: Self-pay | Admitting: Internal Medicine

## 2018-03-19 VITALS — BP 112/60 | HR 72

## 2018-03-19 DIAGNOSIS — R0602 Shortness of breath: Secondary | ICD-10-CM | POA: Diagnosis not present

## 2018-03-19 DIAGNOSIS — R0789 Other chest pain: Secondary | ICD-10-CM

## 2018-03-19 DIAGNOSIS — Z87898 Personal history of other specified conditions: Secondary | ICD-10-CM | POA: Diagnosis not present

## 2018-03-19 LAB — NITRIC OXIDE: NITRIC OXIDE: 10

## 2018-03-19 MED ORDER — FLUTICASONE FUROATE-VILANTEROL 100-25 MCG/INH IN AEPB: 1.0000 | INHALATION_SPRAY | Freq: Every day | RESPIRATORY_TRACT | 0 refills | Status: DC

## 2018-03-19 NOTE — Progress Notes (Signed)
Subjective:    Patient ID: Vanessa Gibson, female    DOB: 1928/06/25, 84 y.o.   MRN: [id number]  PCP Briscoe Deutscher, DO   HPI  IOV 03/19/2018  Chief Complaint  Patient presents with  . Consult    Referred by Dr. Juleen China due to SOB.  Pt states symptoms started 5 months ago, is coughing, has some hoarseness, swelling in feet and is also losing feeling in both hands and feets. Pt also has some pain in back and in neck.   Vanessa Gibson female 84 y.o. [address] [place] [address]  84 year old female.  On-year-old female.  Originally an immigrant from [place] 84 year old female originally an immigrant from [place].  She moved to the [place] > 70 years ago pre-cast[place]. Says some 5 months ago due to living alone relocated [place] home to [address] [place] [address].says when she arrived at [facility] green the room was dusty extremely. She then immediately started feeling short of breath and chest tightness and cough. She then relocated to another apartment but symptoms are still persistent. Mostly she feels chest tightness in the front of the chest and back of the chest. She also says that associated with this there is some hearing loss and nail pain. She is having significant cough that is improved. In March 2019 she had a chest x-ray personally visualized that was clear except for scoliosis. She is rating his symptoms as severe. She is extremely concerned about the symptoms walking desaturation test 185 feet 3 laps on room air. Resting heart rate was 99% she walked all 3 laps and got tired and mildly short of breath. Final pulse ox was 98%. Heart rate at rest was 72/m. She got tachycardic 90/m. Currently she is not having much cough. Feno 12ppb and normal  She also showig some gravel and says that is the dust she is exposed to and wants it tested. Frustrated I cannot test it She is frustrated that I Am not able tooffer a quick fix for her 5 month symptoms She is furstrated I am recommending  empiric trial Asking for admission     Results for Vanessa Gibson, Vanessa Gibson (MRN [id number]) as of 03/19/2018 11:28  Ref. Range 05/25/2017 15:07  Creatinine Latest Ref Range: 0.40 - 1.20 mg/dL 1.24 (H)  Results for Vanessa Gibson, Vanessa Gibson (MRN [id number]) as of 03/19/2018 11:28  Ref. Range 05/25/2017 15:07  Hemoglobin Latest Ref Range: 12.0 - 15.0 g/dL 11.9 (L)     has a past medical history of Anxiety, Arthritis, Breast cancer ([place]), bilateral, Chronic pancreatitis ([place]), Depression, Dizziness, GERD (gastroesophageal reflux disease), History of hiatal hernia, Hyperlipemia, Hypertension, IBS (irritable bowel syndrome), Mandibular fracture ([place]) (12/04/2015), MI (myocardial infarction) ([place]), and Skin cancer of face.   reports that she has never smoked. She has never used smokeless tobacco.  Past Surgical History:  Procedure Laterality Date  . BREAST SURGERY    . CATARACT EXTRACTION W/ INTRAOCULAR LENS  IMPLANT, BILATERAL Bilateral   . CHOLECYSTECTOMY OPEN  1970's  . MASTECTOMY Bilateral     No Known Allergies  Immunization History  Administered Date(s) Administered  . Influenza Whole 08/03/2016  . Influenza, High Dose Seasonal PF 08/09/2015  . Influenza-Unspecified 08/17/2017  . Zoster 01/17/2016    No family history on file.   Current Outpatient Medications:  .  acetaminophen (TYLENOL) 325 MG tablet, Take 2 tablets (650 mg total) by mouth every 6 (six) hours as needed for moderate pain or headache., Disp: , Rfl:  .  albuterol (PROVENTIL HFA;VENTOLIN  HFA) 108 (90 Base) MCG/ACT inhaler, Inhale 2 puffs into the lungs every 6 (six) hours as needed for wheezing or shortness of breath., Disp: 1 Inhaler, Rfl: 2 .  Alcohol Swabs (B-D SINGLE USE SWABS REGULAR) PADS, USE TO CLEAN FINGER BEFORE CHECKING BLOOD SUGARS AND CLEAN SKIN BEFORE INSULIN INJECTIONS., Disp: 300 each, Rfl: 2 .  Alpha-D-Galactosidase (BEANO PO), Take by mouth as needed., Disp: , Rfl:  .  Ascorbic Acid (VITAMIN C) 1000 MG tablet, Take 1,000 mg  by mouth daily., Disp: , Rfl:  .  aspirin EC 81 MG tablet, Take 81 mg by mouth daily., Disp: , Rfl:  .  Blood Glucose Monitoring Suppl (ACCU-CHEK GUIDE) w/Device KIT, 1 Device by Does not apply route as directed., Disp: 1 kit, Rfl: 1 .  cefdinir (OMNICEF) 300 MG capsule, Take 1 capsule (300 mg total) by mouth 2 (two) times daily., Disp: 20 capsule, Rfl: 0 .  fexofenadine (ALLEGRA) 180 MG tablet, Take 180 mg by mouth daily., Disp: , Rfl:  .  fluticasone (FLONASE) 50 MCG/ACT nasal spray, Place 2 sprays into both nostrils daily., Disp: 16 g, Rfl: 0 .  gabapentin (NEURONTIN) 100 MG capsule, Take 1 capsule (100 mg total) by mouth at bedtime., Disp: 30 capsule, Rfl: 3 .  glucose blood (ACCU-CHEK GUIDE) test strip, Use to test blood sugar 3-4 times daily, Disp: 200 each, Rfl: 2 .  hydrOXYzine (ATARAX/VISTARIL) 10 MG tablet, Take 1 tablet (10 mg total) by mouth daily as needed for anxiety., Disp: 30 tablet, Rfl: 0 .  insulin aspart (NOVOLOG FLEXPEN) 100 UNIT/ML FlexPen, Inject 3 units before breakfast and 4 units before supper, Disp: 5 pen, Rfl: 4 .  insulin degludec (TRESIBA) 100 UNIT/ML SOPN FlexTouch Pen, Inject 6 units into the skin once daily, Disp: 18 pen, Rfl: 1 .  Insulin Pen Needle (BD PEN NEEDLE NANO U/F) 32G X 4 MM MISC, USE TO INJECT INSULIN ONCE DAILY, Disp: 90 each, Rfl: 5 .  isosorbide mononitrate (IMDUR) 30 MG 24 hr tablet, Take 1 tablet (30 mg total) by mouth daily. One tablet daily, Disp: 90 tablet, Rfl: 1 .  lisinopril (PRINIVIL,ZESTRIL) 5 MG tablet, Take 1 tablet (5 mg total) by mouth daily., Disp: 90 tablet, Rfl: 1 .  lovastatin (MEVACOR) 20 MG tablet, Take 1 tablet (20 mg total) by mouth at bedtime., Disp: 90 tablet, Rfl: 3 .  magnesium oxide (MAG-OX) 400 MG tablet, Take 400 mg by mouth daily as needed., Disp: , Rfl:  .  metoprolol tartrate (LOPRESSOR) 25 MG tablet, Take 1 tablet (25 mg total) by mouth 2 (two) times daily., Disp: 180 tablet, Rfl: 2 .  montelukast (SINGULAIR) 10 MG  tablet, Take 1 tablet (10 mg total) by mouth at bedtime., Disp: 30 tablet, Rfl: 3 .  Multiple Vitamins-Minerals (CENTRUM SILVER 50+WOMEN) TABS, Take 1 tablet by mouth daily., Disp: , Rfl:  .  Multiple Vitamins-Minerals (HAIR/SKIN/NAILS/BIOTIN PO), Take 1 tablet by mouth daily., Disp: , Rfl:  .  multivitamin-lutein (OCUVITE-LUTEIN) CAPS capsule, Take 1 capsule by mouth daily., Disp: , Rfl:  .  TRUEPLUS LANCETS 28G MISC, Check blood sugar 3-4 times daily. Dx:, Disp: 100 each, Rfl: 0 .  esomeprazole (NEXIUM) 40 MG capsule, Take 1 capsule (40 mg total) by mouth daily. (Patient not taking: Reported on 03/17/2018), Disp: 30 capsule, Rfl: 0 .  nitroGLYCERIN (NITROSTAT) 0.3 MG SL tablet, Place 0.3 mg under the tongue every 5 (five) minutes as needed for chest pain., Disp: , Rfl:     Review of  Systems  Constitutional: Positive for unexpected weight change. Negative for fever.  HENT: Positive for congestion, hearing loss, sinus pressure, sneezing, sore throat and trouble swallowing. Negative for dental problem, ear pain, nosebleeds, postnasal drip and rhinorrhea.   Eyes: Positive for redness and itching.  Respiratory: Positive for cough, chest tightness, shortness of breath and wheezing.   Cardiovascular: Positive for palpitations and leg swelling.  Gastrointestinal: Positive for nausea. Negative for vomiting.  Genitourinary: Negative for dysuria.  Musculoskeletal: Positive for joint swelling.  Skin: Negative for rash.  Allergic/Immunologic: Positive for environmental allergies. Negative for food allergies and immunocompromised state.  Neurological: Positive for dizziness, light-headedness and numbness. Negative for headaches.  Hematological: Bruises/bleeds easily.  Psychiatric/Behavioral: Positive for dysphoric mood. The patient is nervous/anxious.        Objective:   Physical Exam Vitals:   03/19/18 1106  BP: 112/60  Pulse: 72  SpO2: 94%    Estimated body mass index is 24.04 kg/m as  calculated from the following:   Height as of 03/17/18: 4' 11"  (1.499 m).   Weight as of 03/17/18: 119 lb (54 kg).        Assessment & Plan:     ICD-10-CM   1. SOB (shortness of breath) R06.02 Nitric oxide  2. Chest tightness R07.89   3. H/O dust exposure Z87.898     Unclear constellation of symptoms. ? Reactive airway v VCD to dust at new place. Currently feno and walk test are normal. No crackles either. She is furstrated there is no quick fix. COunseled her about plan as best as I can    Try breo once daily for 2 weeks - empiric trial Do HRCT Pre-bd spiro and dlco only. No lung volume or bd response. No post-bd spiro  Return to see APP but after completing above - next 1-2 weeks To ER if worse    Dr. Brand Males, M.D., [facility].C.P Pulmonary and Critical Care Medicine Staff Physician, [place] Director - Interstitial Lung Disease  Program  Pulmonary [place] at [place], [place], [address]  Pager: [phone], If no answer or between  15:00h - 7:00h: call [phone] Telephone: [phone]

## 2018-03-19 NOTE — Addendum Note (Signed)
Addended by: Lorretta Harp on: 03/19/2018 12:49 PM   Modules accepted: Orders

## 2018-03-19 NOTE — Patient Instructions (Addendum)
ICD-10-CM   1. SOB (shortness of breath) R06.02 Nitric oxide  2. Chest tightness R07.89   3. H/O dust exposure Z87.898     Try breo once daily for 2 weeks - empiric trial Do HRCT Pre-bd spiro and dlco only. No lung volume or bd response. No post-bd spiro  Return to see APP but after completing above - next 1-2 weeks To ER if worse

## 2018-03-19 NOTE — Addendum Note (Signed)
Addended by: Lorretta Harp on: 03/19/2018 03:19 PM   Modules accepted: Orders

## 2018-03-22 ENCOUNTER — Telehealth: Payer: Self-pay | Admitting: Family Medicine

## 2018-03-22 NOTE — Telephone Encounter (Signed)
Spoke with patients son and explained that Dr. Juleen China would need to see the patient and him in the office to discuss the health concerns. He has rescheduled to come in on Friday with his Mother. I explained that he would also need to be put on his mothers DPR to discuss in depth information.

## 2018-03-22 NOTE — Telephone Encounter (Signed)
Copied from [place] [id number]. Topic: Quick Communication - See Telephone Encounter >> Mar 22, 2018 11:58 AM Rutherford Nail, NT wrote: CRM for notification. See Telephone encounter for: 03/22/18. Patient's son, Cyan Clippinger, calling and wants a call to discuss Dr Alcario Drought recent assessment of the patient. Would also like to discuss the lab results because he has some questions. Would also like to discuss what complain's his mother has had. States that she has a lot of complaints and she seems to be the "healthiest sick person he knows." Wants to know what Dr. Juleen China really thinks on patient's issues and complaints. Please advise. States he would like a call today because he is going to see his mother today and wants to know what he's dealing with. CB#: [phone]

## 2018-03-26 ENCOUNTER — Ambulatory Visit: Payer: Medicare HMO | Admitting: Family Medicine

## 2018-03-26 VITALS — BP 126/64 | HR 89 | Temp 98.0°F | Ht 59.0 in | Wt 121.0 lb

## 2018-03-26 DIAGNOSIS — J3089 Other allergic rhinitis: Secondary | ICD-10-CM | POA: Diagnosis not present

## 2018-03-26 DIAGNOSIS — K219 Gastro-esophageal reflux disease without esophagitis: Secondary | ICD-10-CM | POA: Diagnosis not present

## 2018-03-26 DIAGNOSIS — F339 Major depressive disorder, recurrent, unspecified: Secondary | ICD-10-CM

## 2018-03-26 MED ORDER — MIRTAZAPINE 15 MG PO TABS
7.5000 mg | ORAL_TABLET | Freq: Every day | ORAL | 2 refills | Status: DC
Start: 2018-03-26 — End: 2018-07-14

## 2018-03-26 NOTE — Progress Notes (Signed)
Vanessa Gibson is a 84 y.o. female is here for follow up.  History of Present Illness:   [name], CMA acting as scribe for Dr. Briscoe Deutscher.   HPI: Patient and son in to talk about medications. Patient is also having a lot of trouble sleeping.   Review of Systems  Constitutional: Negative for chills, diaphoresis, fever, malaise/fatigue and weight loss.  HENT: Negative for hearing loss.   Eyes: Negative for blurred vision.  Respiratory: Negative for cough, shortness of breath and wheezing.   Cardiovascular: Negative for chest pain, palpitations and leg swelling.  Gastrointestinal: Negative for abdominal pain, constipation, diarrhea, heartburn, nausea and vomiting.  Genitourinary: Negative for dysuria, flank pain, frequency, hematuria and urgency.  Musculoskeletal: Negative for joint pain and myalgias.  Skin: Negative for rash.  Neurological: Negative for dizziness, weakness and headaches.  Psychiatric/Behavioral: Negative for depression, substance abuse and suicidal ideas. The patient is not nervous/anxious and does not have insomnia.    Health Maintenance Due  Topic Date Due  . OPHTHALMOLOGY EXAM  08/18/2017   Depression screen [facility] 2/9 08/12/2017 05/18/2017 05/15/2017  Decreased Interest 2 2 3   Down, Depressed, Hopeless 2 2 3   PHQ - 2 Score 4 4 6   Altered sleeping 3 3 3   Tired, decreased energy 3 3 3   Change in appetite 1 2 2   Feeling bad or failure about yourself  2 2 3   Trouble concentrating 3 3 3   Moving slowly or fidgety/restless - 1 1  Suicidal thoughts 0 0 0  PHQ-9 Score 16 18 21   Difficult doing work/chores Very difficult Very difficult Very difficult   PMHx, SurgHx, SocialHx, FamHx, Medications, and Allergies were reviewed in the Visit Navigator and updated as appropriate.   Patient Active Problem List   Diagnosis Date Noted  . Depression, recurrent ([place]), unhappy in [place] but feels stuck, declines medication or counseling 01/24/2018  . At risk for  polypharmacy, due to vitamin overuse, working with patient to "deprescribe" 01/24/2018  . Constipation, uses Miralax prn 01/24/2018  . Diabetic peripheral neuropathy associated with type 2 diabetes mellitus ([place]), on low dose Neurontin 01/24/2018  . Aortic atherosclerosis ([place]), 2018 CXR 01/24/2018  . Lumbar spine scoliosis 01/24/2018  . Hyperlipidemia associated with type 2 diabetes mellitus ([place]), on statin 01/24/2018  . IBS (irritable bowel syndrome) 01/24/2018  . Uncontrolled type 2 diabetes mellitus with hyperglycemia, with long-term current use of insulin ([place]) 12/02/2017  . Insomnia 06/06/2017  . CKD stage 3 secondary to diabetes ([place]), declines ACE/ARB 05/26/2017  . Osteoporosis, post-menopausal 02/06/2016  . Bilateral hearing loss, refuses hearing aids or further testing 02/06/2016  . Arthritis, takes Tumeric  02/06/2016  . Anemia of chronic disease 12/05/2015  . Protein-calorie malnutrition, weight stable 11/13/2015  . Chronic pancreatitis ([place]) 11/13/2015  . GERD (gastroesophageal reflux disease), on Nexium 11/13/2015  . Hypertension associated with diabetes ([place]), on Lisinopril 11/13/2015  . Allergic rhinitis, Rx Xyzal, flonase, atrovent and has been offered referral to Allergy 11/13/2015   Social History   Tobacco Use  . Smoking status: Never Smoker  . Smokeless tobacco: Never Used  Substance Use Topics  . Alcohol use: No  . Drug use: No   Current Medications and Allergies:   Current Outpatient Medications:  .  acetaminophen (TYLENOL) 325 MG tablet, Take 2 tablets (650 mg total) by mouth every 6 (six) hours as needed for moderate pain or headache., Disp: , Rfl:  .  Alcohol Swabs (B-D SINGLE USE SWABS REGULAR) PADS, USE TO CLEAN FINGER  BEFORE CHECKING BLOOD SUGARS AND CLEAN SKIN BEFORE INSULIN INJECTIONS., Disp: 300 each, Rfl: 2 .  Alpha-D-Galactosidase (BEANO PO), Take by mouth as needed., Disp: , Rfl:  .  Ascorbic Acid (VITAMIN C) 1000 MG tablet, Take 1,000 mg by  mouth daily., Disp: , Rfl:  .  aspirin EC 81 MG tablet, Take 81 mg by mouth daily., Disp: , Rfl:  .  Blood Glucose Monitoring Suppl (ACCU-CHEK GUIDE) w/Device KIT, 1 Device by Does not apply route as directed., Disp: 1 kit, Rfl: 1 .  fexofenadine (ALLEGRA) 180 MG tablet, Take 180 mg by mouth daily., Disp: , Rfl:  .  fluticasone (FLONASE) 50 MCG/ACT nasal spray, Place 2 sprays into both nostrils daily., Disp: 16 g, Rfl: 0 .  gabapentin (NEURONTIN) 100 MG capsule, Take 1 capsule (100 mg total) by mouth at bedtime., Disp: 30 capsule, Rfl: 3 .  glucose blood (ACCU-CHEK GUIDE) test strip, Use to test blood sugar 3-4 times daily, Disp: 200 each, Rfl: 2 .  insulin aspart (NOVOLOG FLEXPEN) 100 UNIT/ML FlexPen, Inject 3 units before breakfast and 4 units before supper, Disp: 5 pen, Rfl: 4 .  insulin degludec (TRESIBA) 100 UNIT/ML SOPN FlexTouch Pen, Inject 6 units into the skin once daily, Disp: 18 pen, Rfl: 1 .  Insulin Pen Needle (BD PEN NEEDLE NANO U/F) 32G X 4 MM MISC, USE TO INJECT INSULIN ONCE DAILY, Disp: 90 each, Rfl: 5 .  isosorbide mononitrate (IMDUR) 30 MG 24 hr tablet, Take 1 tablet (30 mg total) by mouth daily. One tablet daily, Disp: 90 tablet, Rfl: 1 .  lisinopril (PRINIVIL,ZESTRIL) 5 MG tablet, Take 1 tablet (5 mg total) by mouth daily., Disp: 90 tablet, Rfl: 1 .  lovastatin (MEVACOR) 20 MG tablet, Take 1 tablet (20 mg total) by mouth at bedtime., Disp: 90 tablet, Rfl: 3 .  magnesium oxide (MAG-OX) 400 MG tablet, Take 400 mg by mouth daily as needed., Disp: , Rfl:  .  metoprolol tartrate (LOPRESSOR) 25 MG tablet, Take 1 tablet (25 mg total) by mouth 2 (two) times daily., Disp: 180 tablet, Rfl: 2 .  montelukast (SINGULAIR) 10 MG tablet, Take 1 tablet (10 mg total) by mouth at bedtime., Disp: 30 tablet, Rfl: 3 .  Multiple Vitamins-Minerals (CENTRUM SILVER 50+WOMEN) TABS, Take 1 tablet by mouth daily., Disp: , Rfl:  .  Multiple Vitamins-Minerals (HAIR/SKIN/NAILS/BIOTIN PO), Take 1 tablet by  mouth daily., Disp: , Rfl:  .  multivitamin-lutein (OCUVITE-LUTEIN) CAPS capsule, Take 1 capsule by mouth daily., Disp: , Rfl:  .  TRUEPLUS LANCETS 28G MISC, Check blood sugar 3-4 times daily. Dx:, Disp: 100 each, Rfl: 0 .  fluticasone furoate-vilanterol (BREO ELLIPTA) 100-25 MCG/INH AEPB, Inhale 1 puff into the lungs daily. (Patient not taking: Reported on 03/26/2018), Disp: 1 each, Rfl: 0  No Known Allergies   Review of Systems   Pertinent items are noted in the HPI. Otherwise, ROS is negative.  Vitals:   Vitals:   03/26/18 1317  BP: 126/64  Pulse: 89  Temp: 98 F (36.7 C)  TempSrc: Oral  SpO2: 94%  Weight: 121 lb (54.9 kg)  Height: 4' 11"  (1.499 m)     Body mass index is 24.44 kg/m.  Physical Exam:   Physical Exam  Constitutional: She appears well-nourished.  HENT:  Head: Normocephalic and atraumatic.  Eyes: Pupils are equal, round, and reactive to light. EOM are normal.  Neck: Normal range of motion. Neck supple.  Cardiovascular: Normal rate, regular rhythm, normal heart sounds and intact distal pulses.  Pulmonary/Chest: Effort normal.  Abdominal: Soft.  Skin: Skin is warm.  Psychiatric: She has a normal mood and affect. Her behavior is normal.  Nursing note and vitals reviewed.   Assessment and Plan:   Skyelynn was seen today for follow-up.  Diagnoses and all orders for this visit:  Depression, recurrent ([place]), unhappy in [place] but feels stuck, declines counseling Comments: Discussed with patient in front of son.  Okay to restart Remeron.  Son will work on family connections.  Gastroesophageal reflux disease without esophagitis Comments: She has not been taking Nexium and has noticed increased GERD.  She will restart.  Allergic rhinitis due to other allergic trigger, unspecified seasonality Comments: Reoffered referral to allergy.  She wants to hold at this time.  Other orders -     mirtazapine (REMERON) 15 MG tablet; Take 0.5 tablets (7.5 mg total) by mouth  at bedtime.   Briscoe Deutscher DO

## 2018-03-27 ENCOUNTER — Encounter: Payer: Self-pay | Admitting: Family Medicine

## 2018-03-29 ENCOUNTER — Institutional Professional Consult (permissible substitution): Payer: Medicare HMO | Admitting: Internal Medicine

## 2018-03-30 ENCOUNTER — Ambulatory Visit (INDEPENDENT_AMBULATORY_CARE_PROVIDER_SITE_OTHER)
Admission: RE | Admit: 2018-03-30 | Discharge: 2018-03-30 | Disposition: A | Discharge status: Home / Self Care | Payer: Medicare HMO | Source: Ambulatory Visit | Attending: Internal Medicine | Admitting: Internal Medicine

## 2018-03-30 DIAGNOSIS — R0600 Dyspnea, unspecified: Secondary | ICD-10-CM | POA: Diagnosis not present

## 2018-03-30 DIAGNOSIS — R0602 Shortness of breath: Secondary | ICD-10-CM | POA: Diagnosis not present

## 2018-03-31 ENCOUNTER — Ambulatory Visit: Payer: Self-pay | Admitting: *Deleted

## 2018-03-31 NOTE — Telephone Encounter (Signed)
Pt would like a call to discuss her gabapentin (NEURONTIN) 100 MG capsule and states that this is making her so sleepy during the day. She wants to know if it needs to be changed or what can she do to help.

## 2018-03-31 NOTE — Telephone Encounter (Signed)
Left message for patient to return call. Directions on Gabapentin is to take 1 capsule at bedtime. Is the patient doing this or is she taking at night?

## 2018-04-02 NOTE — Telephone Encounter (Signed)
Left message for patient to return call.

## 2018-04-02 NOTE — Telephone Encounter (Signed)
Stop Gabapentin. Let's see how she does over the next week.

## 2018-04-02 NOTE — Telephone Encounter (Signed)
Spoke with patient and advised patient to stop the Gabapentin. Ask her to call back next week to let her know to call next week to let us know how she is doing.

## 2018-04-02 NOTE — Telephone Encounter (Signed)
Spoke with patient and she stated that before she started taking the Remeron the Gabapentin was still causing her to be drowsy. She would like to know about switching to something else.

## 2018-04-05 ENCOUNTER — Ambulatory Visit: Payer: Medicare HMO | Admitting: Acute Care

## 2018-04-05 ENCOUNTER — Ambulatory Visit (INDEPENDENT_AMBULATORY_CARE_PROVIDER_SITE_OTHER): Payer: Medicare HMO | Admitting: Internal Medicine

## 2018-04-05 ENCOUNTER — Encounter: Payer: Self-pay | Admitting: Acute Care

## 2018-04-05 ENCOUNTER — Other Ambulatory Visit (INDEPENDENT_AMBULATORY_CARE_PROVIDER_SITE_OTHER): Payer: Medicare HMO

## 2018-04-05 VITALS — BP 122/72 | HR 71 | Ht 59.0 in | Wt 119.6 lb

## 2018-04-05 DIAGNOSIS — J849 Interstitial pulmonary disease, unspecified: Secondary | ICD-10-CM | POA: Diagnosis not present

## 2018-04-05 DIAGNOSIS — R9389 Abnormal findings on diagnostic imaging of other specified body structures: Secondary | ICD-10-CM | POA: Diagnosis not present

## 2018-04-05 DIAGNOSIS — R0602 Shortness of breath: Secondary | ICD-10-CM | POA: Diagnosis not present

## 2018-04-05 HISTORY — PX: OTHER SURGICAL HISTORY: SHX169

## 2018-04-05 LAB — CARDIAC PANEL
CK MB: 3.5 ng/mL (ref 0.3–4.0)
RELATIVE INDEX: 3.8 calc — AB (ref 0.0–2.5)
Total CK: 93 U/L (ref 7–177)

## 2018-04-05 LAB — PULMONARY FUNCTION TEST
DL/VA % pred: 96 %
DL/VA: 3.93 ml/min/mmHg/L
DLCO unc % pred: 67 %
DLCO unc: 11.86 ml/min/mmHg
FEF 25-75 PRE: 2.27 L/s
FEF2575-%Pred-Pre: 307 %
FEV1-%PRED-PRE: 131 %
FEV1-PRE: 1.61 L
FEV1FVC-%Pred-Pre: 121 %
FEV6-%PRED-PRE: 117 %
FEV6-PRE: 1.83 L
FEV6FVC-%PRED-PRE: 108 %
FVC-%Pred-Pre: 108 %
FVC-PRE: 1.83 L
PRE FEV1/FVC RATIO: 88 %
Pre FEV6/FVC Ratio: 100 %

## 2018-04-05 LAB — SEDIMENTATION RATE: SED RATE: 23 mm/h (ref 0–30)

## 2018-04-05 LAB — BRAIN NATRIURETIC PEPTIDE: Pro B Natriuretic peptide (BNP): 66 pg/mL (ref 0.0–100.0)

## 2018-04-05 NOTE — Progress Notes (Signed)
PFT before and DLCO completed 04/05/18

## 2018-04-05 NOTE — Patient Instructions (Addendum)
It is nice to meet you today. Please start  using the Breo once a day without fail. Rinse mouth after use. This is a maintenance medication. BNP today Hypersensitivity pneumonitis.labwork ILD work  up We will call you with results. Sips of water instead of throat clearing.  Throat soothing with sugar free hard candies Follow up with Dr. Chase Caller in 1 months Please contact office for sooner follow up if symptoms do not improve or worsen or seek emergency care

## 2018-04-05 NOTE — Assessment & Plan Note (Addendum)
Vs. Sensitivity pneumoniotis Per HRCT Isolated reduced diffusion capacity on PFT Dyspnea after exposure to some dust in her apartment building Plan: Please start  using the Breo once a day without fail. Rinse mouth after use. This is a maintenance medication. BNP today Hypersensitivity pneumonitis.labwork ILD work  up We will call you with results. Sips of water instead of throat clearing.  Throat soothing with sugar free hard candies Follow up with Dr. Chase Caller in 1 months Please contact office for sooner follow up if symptoms do not improve or worsen or seek emergency care

## 2018-04-05 NOTE — Progress Notes (Signed)
History of Present Illness Vanessa Gibson is a 84 y.o. female with dyspnea and mild ILD pattern per HRCT. She is followed by Dr. Chase Caller.   Synopsis: 84 year old female originally an immigrant from [place].  She moved to the [place] > 70 years ago pre[place]. Says some 5 months ago due to living alone [place] [address].says when she arrived at [facility] the room was dusty extremely. She then immediately started feeling short of breath and chest tightness and cough. She then relocated to another apartment but symptoms are still persistent. Mostly she feels chest tightness in the front of the chest and back of the chest. She also says that associated with this there is some hearing loss and nail pain. She is having significant cough that is improved. In March 2019 she had a chest x-ray personally visualized that was clear except for scoliosis. She is rating his symptoms as severe. She is extremely concerned about the symptoms walking desaturation test 185 feet 3 laps on room air. Resting heart rate was 99% she walked all 3 laps and got tired and mildly short of breath. Final pulse ox was 98%. Heart rate at rest was 72/m. She got tachycardic 90/m. Currently she is not having much cough. Feno 12ppb and normal  She has  also brought  some gravel and says that is the dust she was exposed to and wants it tested. She is Frustrated we  cannot test it She was frustrated that Dr. Chase Caller could not offer a quick fix to her 12  months of symptoms.    04/05/2018 Follow Up: Pt. Was seen as consult by Dr. Chase Caller 4/26/219 for dyspnea and cough. Dr Chase Caller was unsure of her symptoms. ? Reactive airway v VCD to dust at new environment. Currently feno and walk test are normal. No crackles either. She is furstrated there is no quick fix to her 5 months of shortness of breath. . He started her on Breo as a therapeutic trial for her dyspnea, ordered a HRCT, and Pre-bd spiro and dlco  only. Last Echo did show slightly elevated PAP pressure ( 37 mmHg) Pt. Presents for follow up: Pt. States she did not take the [facility] prescribed by Dr. Chase Caller.. Therefore she has not noticed any change in her breathing.She states she was unsure how to use the device. She was given instruction by nurse today  She is now complaining of something in her throat.She states she has a non-productive cough.She states her cough is better because she is taking medication. She is using Allegra, Flonase and Singulair. She states she does have swelling in her ankles. She states she has orthopnea, and a difficulty sleeping flat.She has multiple complaints that are not pulmonary related, and we are frequently re-directing her.She states her  dyspnea and cough are a bit better ( Despite not using her Breo), but not gone. We discussed the need for medical compliance. We reviewed her PFT's, and plan for further work up. She denies fever, chest pain, orthopnea or hemoptysis.  .   Test Results: HRCT 03/30/2018 Minimal patchy subpleural reticulation and ground-glass attenuation in the lower lobes. These findings are nonspecific and could be due to hypoventilatory change or nonspecific scarring, with the possibility of a mild interstitial lung disease such as nonspecific interstitial pneumonia (NSIP) or early usual interstitial pneumonia (UIP) not entirely excluded. A follow-up high-resolution chest CT study may be obtained in 12 months to assess temporal pattern stability, as clinically warranted. 2. Mild patchy  air trapping in both lungs, indicative of small airways disease. 3. Small hiatal hernia. 4. Left main and 3 vessel coronary atherosclerosis.  PFT's 04/05/2018 FVC 1.83 : 108%predicted FEV1 1.61: 131% pred F/F Ratio 88: 117% predicted DLCO 67% Isolated reduced diffusion capacity  CBC Latest Ref Rng & Units 05/25/2017 11/13/2015 11/12/2015  WBC 4.0 - 10.5 K/uL 8.3 7.8 8.1  Hemoglobin 12.0 - 15.0 g/dL  11.9(L) 10.2(L) 10.7(L)  Hematocrit 36.0 - 46.0 % 34.7(L) 31.3(L) 32.0(L)  Platelets 150.0 - 400.0 K/uL 232.0 214 209    BMP Latest Ref Rng & Units 03/17/2018 05/25/2017 03/30/2017  Glucose 70 - 99 mg/dL 62(L) 151(H) 145(H)  BUN 6 - 23 mg/dL 29(H) 33(H) 35(H)  Creatinine 0.40 - 1.20 mg/dL 1.18 1.24(H) 1.17  Sodium 135 - 145 mEq/L 140 138 134(L)  Potassium 3.5 - 5.1 mEq/L 4.4 4.9 5.6(H)  Chloride 96 - 112 mEq/L 106 104 101  CO2 19 - 32 mEq/L 28 27 28   Calcium 8.4 - 10.5 mg/dL 9.9 9.3 9.7    BNP No results found for: BNP  ProBNP No results found for: PROBNP  PFT    Component Value Date/Time   FEV1PRE 1.61 04/05/2018 1014   FVCPRE 1.83 04/05/2018 1014   DLCOUNC 11.86 04/05/2018 1014   PREFEV1FVCRT 88 04/05/2018 1014    Ct Chest High Resolution  Result Date: 03/30/2018 CLINICAL DATA:  Chronic dyspnea for 1 year. History of breast cancer. EXAM: CT CHEST WITHOUT CONTRAST TECHNIQUE: Multidetector CT imaging of the chest was performed following the standard protocol without intravenous contrast. High resolution imaging of the lungs, as well as inspiratory and expiratory imaging, was performed. COMPARISON:  02/17/2018 chest radiograph. 11/12/2015 chest CT angiogram. FINDINGS: Cardiovascular: Normal heart size. No significant pericardial fluid/thickening. Left main and 3 vessel coronary atherosclerosis. Atherosclerotic nonaneurysmal thoracic aorta. Normal caliber pulmonary arteries. Mediastinum/Nodes: No discrete thyroid nodules. Unremarkable esophagus. No pathologically enlarged axillary, mediastinal or gross hilar lymph nodes, noting limited sensitivity for the detection of hilar adenopathy on this noncontrast study. Lungs/Pleura: No pneumothorax. No pleural effusion. No acute consolidative airspace disease, lung masses or significant pulmonary nodules. Mild patchy air trapping in both lungs on the expiration sequence. There is minimal patchy subpleural reticulation and ground-glass attenuation in  both lungs, most prominent in the lower lobes. No significant regions of traction bronchiectasis, architectural distortion or frank honeycombing. Comparison of these findings to 11/12/2015 chest CT angiogram study is precluded by hypoventilatory change on the prior CT. Stable small parenchymal band in the medial segment right middle lobe compatible with mild postinfectious/postinflammatory scarring. Upper abdomen: Small hiatal hernia. Musculoskeletal: No aggressive appearing focal osseous lesions. Bilateral mastectomy. Moderate thoracic spondylosis. IMPRESSION: 1. Minimal patchy subpleural reticulation and ground-glass attenuation in the lower lobes. These findings are nonspecific and could be due to hypoventilatory change or nonspecific scarring, with the possibility of a mild interstitial lung disease such as nonspecific interstitial pneumonia (NSIP) or early usual interstitial pneumonia (UIP) not entirely excluded. A follow-up high-resolution chest CT study may be obtained in 12 months to assess temporal pattern stability, as clinically warranted. 2. Mild patchy air trapping in both lungs, indicative of small airways disease. 3. Small hiatal hernia. 4. Left main and 3 vessel coronary atherosclerosis. Aortic Atherosclerosis (ICD10-I70.0). Electronically Signed   By: Ilona Sorrel M.D.   On: 03/30/2018 18:42     Past medical hx Past Medical History:  Diagnosis Date  . Anxiety   . Arthritis   . Breast cancer ([place]), bilateral   . Chronic pancreatitis (  [place])   . Depression   . Dizziness   . GERD (gastroesophageal reflux disease)   . History of hiatal hernia   . Hyperlipemia   . Hypertension   . IBS (irritable bowel syndrome)   . Mandibular fracture ([place]) 12/04/2015  . MI (myocardial infarction) ([place])   . Skin cancer of face      Social History   Tobacco Use  . Smoking status: Never Smoker  . Smokeless tobacco: Never Used  Substance Use Topics  . Alcohol use: No  . Drug use: No    Ms.Garvey  reports that she has never smoked. She has never used smokeless tobacco. She reports that she does not drink alcohol or use drugs.  Tobacco Cessation: Never smoker  Past surgical hx, Family hx, Social hx all reviewed.  Current Outpatient Medications on File Prior to Visit  Medication Sig  . acetaminophen (TYLENOL) 325 MG tablet Take 2 tablets (650 mg total) by mouth every 6 (six) hours as needed for moderate pain or headache.  . Alcohol Swabs (B-D SINGLE USE SWABS REGULAR) PADS USE TO CLEAN FINGER BEFORE CHECKING BLOOD SUGARS AND CLEAN SKIN BEFORE INSULIN INJECTIONS.  Marland Kitchen Alpha-D-Galactosidase (BEANO PO) Take by mouth as needed.  . Ascorbic Acid (VITAMIN C) 1000 MG tablet Take 1,000 mg by mouth daily.  Marland Kitchen aspirin EC 81 MG tablet Take 81 mg by mouth daily.  . Blood Glucose Monitoring Suppl (ACCU-CHEK GUIDE) w/Device KIT 1 Device by Does not apply route as directed.  . fexofenadine (ALLEGRA) 180 MG tablet Take 180 mg by mouth daily.  . fluticasone (FLONASE) 50 MCG/ACT nasal spray Place 2 sprays into both nostrils daily.  . fluticasone furoate-vilanterol (BREO ELLIPTA) 100-25 MCG/INH AEPB Inhale 1 puff into the lungs daily.  Marland Kitchen gabapentin (NEURONTIN) 100 MG capsule Take 1 capsule (100 mg total) by mouth at bedtime.  Marland Kitchen glucose blood (ACCU-CHEK GUIDE) test strip Use to test blood sugar 3-4 times daily  . insulin aspart (NOVOLOG FLEXPEN) 100 UNIT/ML FlexPen Inject 3 units before breakfast and 4 units before supper  . insulin degludec (TRESIBA) 100 UNIT/ML SOPN FlexTouch Pen Inject 6 units into the skin once daily  . Insulin Pen Needle (BD PEN NEEDLE NANO U/F) 32G X 4 MM MISC USE TO INJECT INSULIN ONCE DAILY  . isosorbide mononitrate (IMDUR) 30 MG 24 hr tablet Take 1 tablet (30 mg total) by mouth daily. One tablet daily  . lisinopril (PRINIVIL,ZESTRIL) 5 MG tablet Take 1 tablet (5 mg total) by mouth daily.  Marland Kitchen lovastatin (MEVACOR) 20 MG tablet Take 1 tablet (20 mg total) by mouth at bedtime.  .  magnesium oxide (MAG-OX) 400 MG tablet Take 400 mg by mouth daily as needed.  . metoprolol tartrate (LOPRESSOR) 25 MG tablet Take 1 tablet (25 mg total) by mouth 2 (two) times daily.  . mirtazapine (REMERON) 15 MG tablet Take 0.5 tablets (7.5 mg total) by mouth at bedtime.  . montelukast (SINGULAIR) 10 MG tablet Take 1 tablet (10 mg total) by mouth at bedtime.  . Multiple Vitamins-Minerals (CENTRUM SILVER 50+WOMEN) TABS Take 1 tablet by mouth daily.  . Multiple Vitamins-Minerals (HAIR/SKIN/NAILS/BIOTIN PO) Take 1 tablet by mouth daily.  . multivitamin-lutein (OCUVITE-LUTEIN) CAPS capsule Take 1 capsule by mouth daily.  . TRUEPLUS LANCETS 28G MISC Check blood sugar 3-4 times daily. Dx:   No current facility-administered medications on file prior to visit.      No Known Allergies  Review Of Systems:  Constitutional:   No  weight loss,  night sweats,  Fevers, chills, +fatigue, or  lassitude.  HEENT:   No headaches,  Difficulty swallowing,  Tooth/dental problems, or  Sore throat,                No sneezing, itching, ear ache, nasal congestion, +post nasal drip,   CV:  No chest pain,  + Orthopnea, No PND, swelling in lower extremities, anasarca, dizziness, palpitations, syncope.   GI  No heartburn, indigestion, abdominal pain, nausea, vomiting, diarrhea, change in bowel habits, loss of appetite, bloody stools.   Resp: +  shortness of breath with exertion or at rest.  No excess mucus, no productive cough,  + non-productive cough,  No coughing up of blood.  No change in color of mucus.  No wheezing.  No chest wall deformity  Skin: no rash or lesions.  GU: no dysuria, change in color of urine, no urgency or frequency.  No flank pain, no hematuria   MS:  No joint pain or swelling.  No decreased range of motion.  No back pain.  Psych:  No change in mood or affect. No depression or anxiety.  No memory loss.   Vital Signs BP 122/72 (BP Location: Right Arm, Cuff Size: Normal)   Pulse 71   Ht  4' 11"  (1.499 m)   Wt 119 lb 9.6 oz (54.3 kg)   SpO2 97%   BMI 24.16 kg/m    Physical Exam:  General- No distress,  A&Ox3, pleasant ENT: No sinus tenderness, TM clear, pale nasal mucosa, no oral exudate,+ post nasal drip, no LAN Cardiac: S1, S2, regular rate and rhythm, no murmur Chest: No wheeze/ rales/ dullness; no accessory muscle use, no nasal flaring, no sternal retractions Abd.: Soft Non-tender, ND, Non-obese. Ext: No clubbing cyanosis, edema Neuro:  normal strength, MAE x 4, A&O x 3 Skin: No rashes, warm and dry Psych: normal mood and behavior   Assessment/Plan  ILD (interstitial lung disease) (HCC) Vs. Sensitivity pneumoniotis Per HRCT Isolated reduced diffusion capacity on PFT Dyspnea after exposure to some dust in her apartment building Plan: Please start  using the Breo once a day without fail. Rinse mouth after use. This is a maintenance medication. BNP today Hypersensitivity pneumonitis.labwork ILD work  up We will call you with results. Sips of water instead of throat clearing.  Throat soothing with sugar free hard candies Follow up with Dr. Chase Caller in 1 months Please contact office for sooner follow up if symptoms do not improve or worsen or seek emergency care       Magdalen Spatz, NP 04/05/2018  1:53 PM

## 2018-04-07 LAB — SJOGREN'S SYNDROME ANTIBODS(SSA + SSB)
SSA (RO) (ENA) ANTIBODY, IGG: NEGATIVE AI
SSB (La) (ENA) Antibody, IgG: <1 AI

## 2018-04-07 LAB — ANCA SCREEN W REFLEX TITER

## 2018-04-07 LAB — ANGIOTENSIN CONVERTING ENZYME: Angiotensin-Converting Enzyme: 14 U/L (ref 9–67)

## 2018-04-07 LAB — CYCLIC CITRUL PEPTIDE ANTIBODY, IGG

## 2018-04-07 LAB — ANTI-DNA ANTIBODY, DOUBLE-STRANDED: ds DNA Ab: 1 IU/mL

## 2018-04-07 LAB — ALDOLASE: Aldolase: 4.1 U/L (ref <=8.1)

## 2018-04-07 LAB — MPO/PR-3 (ANCA) ANTIBODIES: Serine Protease 3: <1 AI

## 2018-04-07 LAB — RHEUMATOID FACTOR: Rhuematoid fact SerPl-aCnc: <14 IU/mL (ref <14)

## 2018-04-07 LAB — RNP ANTIBODY: Ribonucleic Protein(ENA) Antibody, IgG: <1 AI

## 2018-04-07 LAB — ANTI-SCLERODERMA ANTIBODY: Scleroderma (Scl-70) (ENA) Antibody, IgG: <1 AI

## 2018-04-09 ENCOUNTER — Telehealth: Payer: Self-pay | Admitting: Internal Medicine

## 2018-04-09 LAB — HYPERSENSITIVITY PNEUMONITIS
A. PULLULANS ABS: NEGATIVE
A.Fumigatus #1 Abs: NEGATIVE
Micropolyspora faeni, IgG: NEGATIVE
Pigeon Serum Abs: NEGATIVE
THERMOACT. SACCHARII: NEGATIVE
THERMOACTINOMYCES VULGARIS IGG: NEGATIVE

## 2018-04-09 LAB — ANA W/REFLEX: Anti Nuclear Antibody(ANA): NEGATIVE

## 2018-04-09 NOTE — Telephone Encounter (Signed)
Called son to let him know that I would send message to Dr. Chase Caller to review. I left message on VM that we would call as soon as he reviews it.   Dr. Chase Caller can you review CT and PFT?

## 2018-04-09 NOTE — Telephone Encounter (Signed)
Results given to son personally. Explained possibilyt of ILD and for now watch autoimmune panel negative

## 2018-04-19 ENCOUNTER — Emergency Department (HOSPITAL_COMMUNITY)
Admission: EM | Admit: 2018-04-19 | Discharge: 2018-04-20 | Disposition: A | Discharge status: Home / Self Care | Payer: Medicare HMO | Attending: Emergency Medicine | Admitting: Emergency Medicine

## 2018-04-19 DIAGNOSIS — R42 Dizziness and giddiness: Secondary | ICD-10-CM | POA: Diagnosis not present

## 2018-04-19 DIAGNOSIS — Z79899 Other long term (current) drug therapy: Secondary | ICD-10-CM | POA: Diagnosis not present

## 2018-04-19 DIAGNOSIS — Z794 Long term (current) use of insulin: Secondary | ICD-10-CM | POA: Diagnosis not present

## 2018-04-19 DIAGNOSIS — N183 Chronic kidney disease, stage 3 (moderate): Secondary | ICD-10-CM | POA: Insufficient documentation

## 2018-04-19 DIAGNOSIS — Z7982 Long term (current) use of aspirin: Secondary | ICD-10-CM | POA: Diagnosis not present

## 2018-04-19 DIAGNOSIS — Z853 Personal history of malignant neoplasm of breast: Secondary | ICD-10-CM | POA: Diagnosis not present

## 2018-04-19 DIAGNOSIS — E11649 Type 2 diabetes mellitus with hypoglycemia without coma: Secondary | ICD-10-CM | POA: Diagnosis not present

## 2018-04-19 DIAGNOSIS — R0602 Shortness of breath: Secondary | ICD-10-CM | POA: Insufficient documentation

## 2018-04-19 DIAGNOSIS — I129 Hypertensive chronic kidney disease with stage 1 through stage 4 chronic kidney disease, or unspecified chronic kidney disease: Secondary | ICD-10-CM | POA: Insufficient documentation

## 2018-04-19 DIAGNOSIS — R609 Edema, unspecified: Secondary | ICD-10-CM | POA: Diagnosis not present

## 2018-04-19 DIAGNOSIS — J45909 Unspecified asthma, uncomplicated: Secondary | ICD-10-CM | POA: Diagnosis not present

## 2018-04-19 DIAGNOSIS — E1122 Type 2 diabetes mellitus with diabetic chronic kidney disease: Secondary | ICD-10-CM | POA: Diagnosis not present

## 2018-04-19 DIAGNOSIS — Z85828 Personal history of other malignant neoplasm of skin: Secondary | ICD-10-CM | POA: Insufficient documentation

## 2018-04-19 DIAGNOSIS — E162 Hypoglycemia, unspecified: Secondary | ICD-10-CM

## 2018-04-19 DIAGNOSIS — E161 Other hypoglycemia: Secondary | ICD-10-CM | POA: Diagnosis not present

## 2018-04-19 HISTORY — DX: Unspecified asthma, uncomplicated: J45.909

## 2018-04-19 LAB — CBG MONITORING, ED: GLUCOSE-CAPILLARY: 41 mg/dL — AB (ref 65–99)

## 2018-04-19 MED ORDER — DEXTROSE 50 % IV SOLN: 50.0000 mL | Freq: Once | INTRAVENOUS | Status: DC

## 2018-04-19 MED ORDER — DEXTROSE 50 % IV SOLN
50.0000 mL | Freq: Once | INTRAVENOUS | Status: AC
Start: 2018-04-20 — End: 2018-04-20
  Administered 2018-04-20: 50 mL via INTRAVENOUS
  Filled 2018-04-19: qty 50

## 2018-04-19 NOTE — ED Notes (Signed)
Pt's CBG 41- RN notified, graham crackers and peanut-butter given to pt.

## 2018-04-19 NOTE — Progress Notes (Signed)
Hypoglycemic Event  CBG: 41  Treatment: juice, graham crackers and peanut butter, D50  Symptoms:   Follow-up CBG: Time:0017 CBG Result: 229  Possible Reasons for Event: overdose of novolog  Comments/MD notified: Yes, D 50 ordered    Vanessa Gibson

## 2018-04-19 NOTE — ED Notes (Signed)
Bed: WA20 Expected date:  Expected time:  Means of arrival:  Comments: EMS 

## 2018-04-20 ENCOUNTER — Encounter (HOSPITAL_COMMUNITY): Payer: Self-pay | Admitting: Emergency Medicine

## 2018-04-20 ENCOUNTER — Telehealth: Payer: Self-pay | Admitting: Endocrinology

## 2018-04-20 ENCOUNTER — Other Ambulatory Visit: Payer: Self-pay

## 2018-04-20 ENCOUNTER — Emergency Department (HOSPITAL_COMMUNITY): Payer: Medicare HMO

## 2018-04-20 DIAGNOSIS — R0602 Shortness of breath: Secondary | ICD-10-CM | POA: Diagnosis not present

## 2018-04-20 LAB — BASIC METABOLIC PANEL
ANION GAP: 11 (ref 5–15)
BUN: 35 mg/dL — ABNORMAL HIGH (ref 6–20)
CALCIUM: 9.6 mg/dL (ref 8.9–10.3)
CO2: 27 mmol/L (ref 22–32)
CREATININE: 1.37 mg/dL — AB (ref 0.44–1.00)
Chloride: 105 mmol/L (ref 101–111)
GFR calc Af Amer: 39 mL/min — ABNORMAL LOW (ref >60)
GFR, EST NON AFRICAN AMERICAN: 34 mL/min — AB (ref >60)
GLUCOSE: 83 mg/dL (ref 65–99)
Potassium: 4.3 mmol/L (ref 3.5–5.1)
Sodium: 143 mmol/L (ref 135–145)

## 2018-04-20 LAB — CBC WITH DIFFERENTIAL/PLATELET
BASOS ABS: 0 10*3/uL (ref 0.0–0.1)
Basophils Relative: 0 %
EOS PCT: 1 %
Eosinophils Absolute: 0.1 10*3/uL (ref 0.0–0.7)
HCT: 33.4 % — ABNORMAL LOW (ref 36.0–46.0)
Hemoglobin: 11.1 g/dL — ABNORMAL LOW (ref 12.0–15.0)
LYMPHS ABS: 2.1 10*3/uL (ref 0.7–4.0)
Lymphocytes Relative: 21 %
MCH: 32.3 pg (ref 26.0–34.0)
MCHC: 33.2 g/dL (ref 30.0–36.0)
MCV: 97.1 fL (ref 78.0–100.0)
Monocytes Absolute: 0.8 10*3/uL (ref 0.1–1.0)
Monocytes Relative: 8 %
Neutro Abs: 7.2 10*3/uL (ref 1.7–7.7)
Neutrophils Relative %: 70 %
PLATELETS: 240 10*3/uL (ref 150–400)
RBC: 3.44 MIL/uL — AB (ref 3.87–5.11)
RDW: 12.6 % (ref 11.5–15.5)
WBC: 10.2 10*3/uL (ref 4.0–10.5)

## 2018-04-20 LAB — BRAIN NATRIURETIC PEPTIDE: B Natriuretic Peptide: 61.7 pg/mL (ref 0.0–100.0)

## 2018-04-20 LAB — CBG MONITORING, ED
GLUCOSE-CAPILLARY: 53 mg/dL — AB (ref 65–99)
GLUCOSE-CAPILLARY: 112 mg/dL — AB (ref 65–99)
Glucose-Capillary: 101 mg/dL — ABNORMAL HIGH (ref 65–99)
Glucose-Capillary: 229 mg/dL — ABNORMAL HIGH (ref 65–99)
Glucose-Capillary: 78 mg/dL (ref 65–99)
Glucose-Capillary: 86 mg/dL (ref 65–99)

## 2018-04-20 NOTE — ED Notes (Signed)
RN notified of 53 CBG.

## 2018-04-20 NOTE — ED Triage Notes (Signed)
Patient mistakenly took 20 units of novolog instead of her usual 6 units tonight. When EMS arrived to the scene CBG was 75.

## 2018-04-20 NOTE — Telephone Encounter (Signed)
Patient stated she need to talk to the nurse she just got out of the hospital today,her b/s are irregular. Please advise

## 2018-04-20 NOTE — Telephone Encounter (Signed)
Patient called and left a message requesting her to call back and verify insulin dosages.

## 2018-04-20 NOTE — ED Notes (Signed)
Spoke with poison control, plan to close her case.

## 2018-04-20 NOTE — ED Notes (Signed)
RN notified of CBG of 112.

## 2018-04-20 NOTE — ED Notes (Signed)
Poison control recommends monitoring patient for at least 5 hours post Dextrose 50% administration.

## 2018-04-20 NOTE — Telephone Encounter (Signed)
Called patient and left message for patient to call back

## 2018-04-20 NOTE — Telephone Encounter (Signed)
Cannot adjust her insulin based on the last 2 blood sugars and will need to know what her blood sugars are over 3 days.

## 2018-04-20 NOTE — ED Provider Notes (Signed)
[facility] DEPT Provider Note   CSN: [id number] Arrival date & time: 04/19/18  2330     History   Chief Complaint Chief Complaint  Patient presents with  . Hypoglycemia    HPI Vanessa Gibson is a 84 y.o. female.  HPI  This is an 84 year old female with a history of asthma, breast cancer, pancreatitis, dizziness, hypertension, hyperlipidemia who presents with hypoglycemia.  Patient reports that she accidentally took 20 units of NovoLog instead of 20 units of Tresiba this evening.  She noted her blood sugar was 47.  She states she feels "like I might pass out."  Blood sugar upon arrival was 41.  She is currently eating and drinking orange juice.  On review of systems, patient reports worsening shortness of breath over the last year as well as lower extremity swelling.  Patient states that she inhaled some dust at her living facility and this is what caused her symptoms.  She denies any cough or fevers.  She states that she has had multiple work-ups with "they cannot find anything wrong with me."  This makes her very anxious.  She denies current chest pain, abdominal pain, nausea, vomiting.  Past Medical History:  Diagnosis Date  . Anxiety   . Arthritis   . Asthma   . Breast cancer ([place]), bilateral   . Chronic pancreatitis ([place])   . Depression   . Dizziness   . GERD (gastroesophageal reflux disease)   . History of hiatal hernia   . Hyperlipemia   . Hypertension   . IBS (irritable bowel syndrome)   . Mandibular fracture ([place]) 12/04/2015  . MI (myocardial infarction) ([place])   . Skin cancer of face     Patient Active Problem List   Diagnosis Date Noted  . ILD (interstitial lung disease) ([place]) 04/05/2018  . Depression, recurrent ([place]), unhappy in [place] but feels stuck, declines medication or counseling 01/24/2018  . At risk for polypharmacy, due to vitamin overuse, working with patient to "deprescribe" 01/24/2018  . Constipation, uses Miralax prn  01/24/2018  . Diabetic peripheral neuropathy associated with type 2 diabetes mellitus ([place]), on low dose Neurontin 01/24/2018  . Aortic atherosclerosis ([place]), 2018 CXR 01/24/2018  . Lumbar spine scoliosis 01/24/2018  . Hyperlipidemia associated with type 2 diabetes mellitus ([place]), on statin 01/24/2018  . IBS (irritable bowel syndrome) 01/24/2018  . Uncontrolled type 2 diabetes mellitus with hyperglycemia, with long-term current use of insulin ([place]) 12/02/2017  . Insomnia 06/06/2017  . CKD stage 3 secondary to diabetes ([place]), declines ACE/ARB 05/26/2017  . Osteoporosis, post-menopausal 02/06/2016  . Bilateral hearing loss, refuses hearing aids or further testing 02/06/2016  . Arthritis, takes Tumeric  02/06/2016  . Anemia of chronic disease 12/05/2015  . Protein-calorie malnutrition, weight stable 11/13/2015  . Chronic pancreatitis ([place]) 11/13/2015  . GERD (gastroesophageal reflux disease), on Nexium 11/13/2015  . Hypertension associated with diabetes ([place]), on Lisinopril 11/13/2015  . Allergic rhinitis, Rx Xyzal, flonase, atrovent and has been offered referral to Allergy 11/13/2015    Past Surgical History:  Procedure Laterality Date  . BREAST SURGERY    . CATARACT EXTRACTION W/ INTRAOCULAR LENS  IMPLANT, BILATERAL Bilateral   . CHOLECYSTECTOMY OPEN  1970's  . MASTECTOMY Bilateral      OB History   None      Home Medications    Prior to Admission medications   Medication Sig Start Date End Date Taking? Authorizing Provider  acetaminophen (TYLENOL) 325 MG tablet Take 2 tablets (650 mg total)  by mouth every 6 (six) hours as needed for moderate pain or headache. 11/14/15  Yes Iline Oven, MD  Alpha-D-Galactosidase (BEANO PO) Take by mouth as needed (gas).    Yes [provider]  Ascorbic Acid (VITAMIN C) 1000 MG tablet Take 1,000 mg by mouth daily.   Yes [provider]  aspirin EC 81 MG tablet Take 81 mg by mouth daily.   Yes [provider]    fexofenadine (ALLEGRA) 180 MG tablet Take 180 mg by mouth daily.   Yes [provider]  fluticasone (FLONASE) 50 MCG/ACT nasal spray Place 2 sprays into both nostrils daily. 08/12/17  Yes Briscoe Deutscher, DO  fluticasone furoate-vilanterol (BREO ELLIPTA) 100-25 MCG/INH AEPB Inhale 1 puff into the lungs daily. 03/19/18  Yes Brand Males, MD  gabapentin (NEURONTIN) 100 MG capsule Take 1 capsule (100 mg total) by mouth at bedtime. 09/23/17  Yes Briscoe Deutscher, DO  insulin aspart (NOVOLOG FLEXPEN) 100 UNIT/ML FlexPen Inject 3 units before breakfast and 4 units before supper Patient taking differently: Inject 6 units before breakfast and 6 units before supper 03/15/18  Yes Elayne Snare, MD  insulin degludec (TRESIBA) 100 UNIT/ML SOPN FlexTouch Pen Inject 6 units into the skin once daily 03/15/18  Yes Elayne Snare, MD  isosorbide mononitrate (IMDUR) 30 MG 24 hr tablet Take 1 tablet (30 mg total) by mouth daily. One tablet daily 03/01/18  Yes Elayne Snare, MD  lisinopril (PRINIVIL,ZESTRIL) 5 MG tablet Take 1 tablet (5 mg total) by mouth daily. 02/23/18  Yes Briscoe Deutscher, DO  lovastatin (MEVACOR) 20 MG tablet Take 1 tablet (20 mg total) by mouth at bedtime. 02/11/18  Yes Briscoe Deutscher, DO  magnesium oxide (MAG-OX) 400 MG tablet Take 400 mg by mouth daily.    Yes [provider]  metoprolol tartrate (LOPRESSOR) 25 MG tablet Take 1 tablet (25 mg total) by mouth 2 (two) times daily. 09/21/17  Yes Elayne Snare, MD  mirtazapine (REMERON) 15 MG tablet Take 0.5 tablets (7.5 mg total) by mouth at bedtime. 03/26/18  Yes Briscoe Deutscher, DO  Multiple Vitamins-Minerals (CENTRUM SILVER 50+WOMEN) TABS Take 1 tablet by mouth daily.   Yes [provider]  Multiple Vitamins-Minerals (HAIR/SKIN/NAILS/BIOTIN PO) Take 1 tablet by mouth daily.   Yes [provider]  multivitamin-lutein (OCUVITE-LUTEIN) CAPS capsule Take 1 capsule by mouth daily.   Yes [provider]  Alcohol Swabs (B-D  SINGLE USE SWABS REGULAR) PADS USE TO CLEAN FINGER BEFORE CHECKING BLOOD SUGARS AND CLEAN SKIN BEFORE INSULIN INJECTIONS. 10/19/17   Elayne Snare, MD  Blood Glucose Monitoring Suppl (ACCU-CHEK GUIDE) w/Device KIT 1 Device by Does not apply route as directed. 02/26/18   Elayne Snare, MD  glucose blood (ACCU-CHEK GUIDE) test strip Use to test blood sugar 3-4 times daily 02/26/18   Elayne Snare, MD  Insulin Pen Needle (BD PEN NEEDLE NANO U/F) 32G X 4 MM MISC USE TO INJECT INSULIN ONCE DAILY 03/18/18   Elayne Snare, MD  montelukast (SINGULAIR) 10 MG tablet Take 1 tablet (10 mg total) by mouth at bedtime. Patient not taking: Reported on 04/20/2018 01/19/18   Briscoe Deutscher, DO  TRUEPLUS LANCETS 28G MISC Check blood sugar 3-4 times daily. Dx: 03/10/18   Elayne Snare, MD    Family History History reviewed. No pertinent family history.  Social History Social History   Tobacco Use  . Smoking status: Never Smoker  . Smokeless tobacco: Never Used  Substance Use Topics  . Alcohol use: No  . Drug use:  No     Allergies   Patient has no known allergies.   Review of Systems Review of Systems  Constitutional: Negative for fever.  Respiratory: Positive for shortness of breath. Negative for cough.   Cardiovascular: Positive for leg swelling. Negative for chest pain.  Gastrointestinal: Negative for abdominal pain, nausea and vomiting.  Genitourinary: Negative for dysuria.  Neurological: Positive for dizziness.  All other systems reviewed and are negative.    Physical Exam Updated Vital Signs BP (!) 155/79 (BP Location: Right Arm)   Pulse 78   Temp 97.9 F (36.6 C) (Oral)   Resp 12   Ht _0  (1.499 m)   Wt 54 kg (119 lb)   SpO2 95%   BMI 24.04 kg/m   Physical Exam  Constitutional: She is oriented to person, place, and time. She appears well-developed and well-nourished. No distress.  HENT:  Head: Normocephalic and atraumatic.  Cardiovascular: Normal rate, regular rhythm and normal heart  sounds.  No murmur heard. Pulmonary/Chest: Effort normal and breath sounds normal. No respiratory distress. She has no wheezes.  Abdominal: Soft. Bowel sounds are normal. There is no tenderness.  Musculoskeletal:  Bilateral symmetric pedal edema  Neurological: She is alert and oriented to person, place, and time.  Skin: Skin is warm and dry.  Psychiatric: She has a normal mood and affect.  Nursing note and vitals reviewed.    ED Treatments / Results  Labs (all labs ordered are listed, but only abnormal results are displayed) Labs Reviewed  CBC WITH DIFFERENTIAL/PLATELET - Abnormal; Notable for the following components:      Result Value   RBC 3.44 (*)    Hemoglobin 11.1 (*)    HCT 33.4 (*)    All other components within normal limits  BASIC METABOLIC PANEL - Abnormal; Notable for the following components:   BUN 35 (*)    Creatinine, Ser 1.37 (*)    GFR calc non Af Amer 34 (*)    GFR calc Af Amer 39 (*)    All other components within normal limits  CBG MONITORING, ED - Abnormal; Notable for the following components:   Glucose-Capillary 41 (*)    All other components within normal limits  CBG MONITORING, ED - Abnormal; Notable for the following components:   Glucose-Capillary 229 (*)    All other components within normal limits  CBG MONITORING, ED - Abnormal; Notable for the following components:   Glucose-Capillary 53 (*)    All other components within normal limits  CBG MONITORING, ED - Abnormal; Notable for the following components:   Glucose-Capillary 101 (*)    All other components within normal limits  BRAIN NATRIURETIC PEPTIDE  CBG MONITORING, ED  CBG MONITORING, ED  CBG MONITORING, ED    EKG EKG Interpretation  Date/Time:  Tuesday Apr 20 2018 00:08:09 EDT Ventricular Rate:  93 PR Interval:    QRS Duration: 133 QT Interval:  395 QTC Calculation: 492 R Axis:   -63 Text Interpretation:  Sinus rhythm Borderline prolonged PR interval RBBB and LAFB Block more  pronounced when compared to prior Confirmed by Thayer Jew [id number]) on 04/20/2018 12:15:29 AM   Radiology Dg Chest 2 View  Result Date: 04/20/2018 CLINICAL DATA:  Acute onset of shortness of breath. Hyperglycemia. EXAM: CHEST - 2 VIEW COMPARISON:  Chest radiograph performed 02/17/2018, and CT of the chest performed 03/30/2018 FINDINGS: The lungs are well-aerated and clear. There is no evidence of focal opacification, pleural effusion or pneumothorax. The heart is borderline normal  in size. No acute osseous abnormalities are seen. IMPRESSION: No acute cardiopulmonary process seen. Electronically Signed   By: Garald Balding M.D.   On: 04/20/2018 00:45    Procedures Procedures (including critical care time)  Medications Ordered in ED Medications  dextrose 50 % solution 50 mL (50 mLs Intravenous Given 04/20/18 0005)     Initial Impression / Assessment and Plan / ED Course  I have reviewed the triage vital signs and the nursing notes.  Pertinent labs & imaging results that were available during my care of the patient were reviewed by me and considered in my medical decision making (see chart for details).     Patient presents with hypoglycemia.  This is after excellently taking her short acting insulin 20 units.  She is nontoxic-appearing.  She is currently eating.  She does state that she feels dizzy.  Given that her initial blood sugar was 41.  She was given 1 dose of D50.  Lab work-up is otherwise unremarkable.  Given mechanism of action and duration of onset as well as peak onset, patient will need to be observed for several hours.  Would expect peak effect 1 to 2 hours after administration.  Patient did have one additional episode of hypoglycemia with a glucose of 53.  She ate and this resolved to 24.  She did not require any further IV dextrose.  Repeat blood sugars continue to trend appropriately.  On recheck, she states she feels much better.  I recommend that she forego her morning  insulin.  Continue to monitor blood sugars closely.  She may resume insulin if blood sugars remain stable by lunchtime.  After history, exam, and medical workup I feel the patient has been appropriately medically screened and is safe for discharge home. Pertinent diagnoses were discussed with the patient. Patient was given return precautions.   Final Clinical Impressions(s) / ED Diagnoses   Final diagnoses:  Hypoglycemia    ED Discharge Orders    None       Merryl Hacker, MD 04/20/18 [phone]

## 2018-04-20 NOTE — Telephone Encounter (Signed)
Patient called back and gave these blood sugar readings.  Today at 1600- 97 Noon today -263 Breakfast today 154  No readings for 04/19/18  Morning on 04/18/18- 154  Bedtime on 04/17/18-154 Morning on 04/17/18-124  Patient is voicing frustrations that she does not know what to do and that she is beginning worry tremendously. Patient was reassured that the message will be relayed to Dr. Dwyane Dee as soon as she hangs up the phone. Patient was also informed that as soon as Dr. Dwyane Dee addresses this issue, she will get a return phone call from this office. Pt verbalized understanding.

## 2018-04-20 NOTE — Progress Notes (Signed)
Hypoglycemic Event  CBG: 53  Treatment: orange juice  Symptoms: none  Follow-up CBG: Time:0256 CBG Result:86  Possible Reasons for Event: previous overdose of novolog  Comments/MD notified: yes    Harlene Ramus A

## 2018-04-20 NOTE — Telephone Encounter (Signed)
These are good blood sugar levels for her considering her age and will not cause any physical symptoms. She will need to check more readings after breakfast and let us know what they are.  Also confirm what the insulin doses she is taking

## 2018-04-20 NOTE — Discharge Instructions (Addendum)
You were seen today for low blood sugars.  Continue to monitor your sugars closely throughout the morning.  Eat a normal breakfast.  Do not take your morning dose of insulin.  Resume normal insulin dosage with lunch if blood sugars remain stable.

## 2018-04-20 NOTE — Telephone Encounter (Signed)
Patient states that her blood sugars have been fluctuating greatly and she mistakenly took the wrong insulin yesterday and was rushed to the hospital and just released. She would like to be advised on how she can better control her sugars. She does not know the last weeks worth of blood sugars. She just knows that at noon it was 268 and yesterday when she took the wrong insulin, it dropped to 47.

## 2018-05-05 ENCOUNTER — Other Ambulatory Visit: Payer: Self-pay | Admitting: Endocrinology

## 2018-05-10 ENCOUNTER — Other Ambulatory Visit: Payer: Self-pay | Admitting: Endocrinology

## 2018-05-24 ENCOUNTER — Telehealth: Payer: Self-pay | Admitting: Endocrinology

## 2018-05-24 NOTE — Telephone Encounter (Signed)
Patient stated she was returning your call

## 2018-05-24 NOTE — Telephone Encounter (Signed)
No one from this office called this patient.

## 2018-05-26 ENCOUNTER — Ambulatory Visit: Payer: Medicare HMO | Admitting: Endocrinology

## 2018-05-26 ENCOUNTER — Encounter: Payer: Self-pay | Admitting: Endocrinology

## 2018-05-26 VITALS — BP 112/60 | HR 81 | Ht 59.0 in | Wt 122.0 lb

## 2018-05-26 DIAGNOSIS — E1165 Type 2 diabetes mellitus with hyperglycemia: Secondary | ICD-10-CM

## 2018-05-26 DIAGNOSIS — R635 Abnormal weight gain: Secondary | ICD-10-CM | POA: Diagnosis not present

## 2018-05-26 DIAGNOSIS — R200 Anesthesia of skin: Secondary | ICD-10-CM

## 2018-05-26 DIAGNOSIS — Z794 Long term (current) use of insulin: Secondary | ICD-10-CM

## 2018-05-26 DIAGNOSIS — E1142 Type 2 diabetes mellitus with diabetic polyneuropathy: Secondary | ICD-10-CM

## 2018-05-26 DIAGNOSIS — R202 Paresthesia of skin: Secondary | ICD-10-CM | POA: Diagnosis not present

## 2018-05-26 LAB — POCT GLYCOSYLATED HEMOGLOBIN (HGB A1C): Hemoglobin A1C: 6.8 % — AB (ref 4.0–5.6)

## 2018-05-26 MED ORDER — GABAPENTIN 100 MG PO CAPS: 100.0000 mg | ORAL_CAPSULE | Freq: Three times a day (TID) | ORAL | 3 refills | Status: DC | PRN

## 2018-05-26 NOTE — Patient Instructions (Signed)
May take Gabapentin 3 x daily for numbness  Call Dr Juleen China   Compression stockings  Novolog 8 Units for larger meals at dinner

## 2018-05-26 NOTE — Progress Notes (Signed)
Patient ID: Vanessa Gibson, female   DOB: 23-Jan-1928, 84 y.o.   MRN: [id number]           Reason for Appointment: Follow-up for Type 2 Diabetes  Referring physician: Drema Dallas   History of Present Illness:          Date of diagnosis of type 2 diabetes mellitus: 1997        Background history:   She had been treated with metformin mostly since her diagnosis At some point was given glipizide/metformin combination, detailed history is not available from PCP Also not clear what level of control she has had until recently  Recent history:   Non-insulin hypoglycemic drugs the patient is taking are: none   INSULIN dose: Tresiba 20 units at night,  Novolog 6 units at breakfast and 6 at supper  Her last A1c was 7.5, now 6.8   Current management, blood sugar patterns and problems identified:   She was the emergency room about 6 weeks ago because she took 20 units of NovoLog by mistake instead of Tresiba  Blood sugars are fluctuating after supper at night and she thinks that sometimes she will eat larger meals or desserts on certain occasions when going out or otherwise but does not adjust her suppertime doses based on this  She thinks she is consistent with taking her insulin doses otherwise as prescribed and appears to be familiar with her 2 insulin doses now  FASTING readings are relatively stable and only once below 100  She does sporadic blood sugars in the afternoons which may be occasionally high based on how much carbohydrate she is eating  However is eating mostly with only 2 structured meals a day  Her weight has come up somewhat more  Blood sugars on average are about the same as in her last visit  No hypoglycemia at home recently        Side effects from medications have been: ?  Nausea from metformin  Compliance with the medical regimen: Fairly good  Hypoglycemia: None    Glucose monitoring:  done 1-2 times a day         Glucometer:  Accu-Chek  Blood Glucose  readings  by download  Mean values apply above for all meters except median for One Touch  PRE-MEAL Fasting Lunch Dinner Bedtime Overall  Glucose range:  93-172    123-310   Mean/median:  123    172 148     Self-care: The diet that the patient has been following is: tries to limit sweets  She has her meals with assisted living facility     Typical meal intake: Breakfast is cereal, sometimes bacon.  Lunch: yogurt, soup, cottage cheese or peanut butter with apple, or other light meal usually           Dietician visit, most recent: Never CDE visit: 1/18               Exercise:  Less recently  Weight history: Previous range 98-140   Wt Readings from Last 3 Encounters:  05/26/18 122 lb (55.3 kg)  04/20/18 119 lb (54 kg)  04/05/18 119 lb 9.6 oz (54.3 kg)    Glycemic control:  A1c in October 2017 = 8.5    Lab Results  Component Value Date   HGBA1C 6.8 (A) 05/26/2018   HGBA1C 7.5 12/30/2017   HGBA1C 6.8 09/21/2017   Lab Results  Component Value Date   MICROALBUR <0.7 11/14/2016   LDLCALC 71 03/17/2018   CREATININE  1.37 (H) 04/20/2018   Lab Results  Component Value Date   MICRALBCREAT 1.8 11/14/2016       Allergies as of 05/26/2018   No Known Allergies     Medication List        Accurate as of 05/26/18  3:32 PM. Always use your most recent med list.          ACCU-CHEK GUIDE w/Device Kit USE AS DIRECTED   acetaminophen 325 MG tablet Commonly known as:  TYLENOL Take 2 tablets (650 mg total) by mouth every 6 (six) hours as needed for moderate pain or headache.   aspirin EC 81 MG tablet Take 81 mg by mouth daily.   B-D SINGLE USE SWABS REGULAR Pads USE TO CLEAN FINGER BEFORE CHECKING BLOOD SUGARS AND CLEAN SKIN BEFORE INSULIN INJECTIONS.   BEANO PO Take by mouth as needed (gas).   fexofenadine 180 MG tablet Commonly known as:  ALLEGRA Take 180 mg by mouth daily.   fluticasone 50 MCG/ACT nasal spray Commonly known as:  FLONASE Place 2 sprays into  both nostrils daily.   fluticasone furoate-vilanterol 100-25 MCG/INH Aepb Commonly known as:  BREO ELLIPTA Inhale 1 puff into the lungs daily.   gabapentin 100 MG capsule Commonly known as:  NEURONTIN Take 1 capsule (100 mg total) by mouth 3 (three) times daily as needed. For tingling hands   glucose blood test strip Commonly known as:  ACCU-CHEK GUIDE Use to test blood sugar 3-4 times daily   insulin aspart 100 UNIT/ML FlexPen Commonly known as:  NOVOLOG FLEXPEN Inject 3 units before breakfast and 4 units before supper   insulin degludec 100 UNIT/ML Sopn FlexTouch Pen Commonly known as:  TRESIBA Inject 6 units into the skin once daily   Insulin Pen Needle 32G X 4 MM Misc Commonly known as:  BD PEN NEEDLE NANO U/F USE TO INJECT INSULINS 4 TIMES DAILY.   isosorbide mononitrate 30 MG 24 hr tablet Commonly known as:  IMDUR Take 1 tablet (30 mg total) by mouth daily. One tablet daily   lisinopril 5 MG tablet Commonly known as:  PRINIVIL,ZESTRIL Take 1 tablet (5 mg total) by mouth daily.   lovastatin 20 MG tablet Commonly known as:  MEVACOR Take 1 tablet (20 mg total) by mouth at bedtime.   magnesium oxide 400 MG tablet Commonly known as:  MAG-OX Take 400 mg by mouth daily.   metoprolol tartrate 25 MG tablet Commonly known as:  LOPRESSOR Take 1 tablet (25 mg total) by mouth 2 (two) times daily.   mirtazapine 15 MG tablet Commonly known as:  REMERON Take 0.5 tablets (7.5 mg total) by mouth at bedtime.   montelukast 10 MG tablet Commonly known as:  SINGULAIR Take 1 tablet (10 mg total) by mouth at bedtime.   multivitamin-lutein Caps capsule Take 1 capsule by mouth daily.   HAIR/SKIN/NAILS/BIOTIN PO Take 1 tablet by mouth daily.   CENTRUM SILVER 50+WOMEN Tabs Take 1 tablet by mouth daily.   TRUEPLUS LANCETS 28G Misc Check blood sugar 3-4 times daily. Dx:   vitamin C 1000 MG tablet Take 1,000 mg by mouth daily.       Allergies: No Known Allergies  Past  Medical History:  Diagnosis Date  . Anxiety   . Arthritis   . Asthma   . Breast cancer ([place]), bilateral   . Chronic pancreatitis ([place])   . Depression   . Dizziness   . GERD (gastroesophageal reflux disease)   . History of hiatal hernia   .  Hyperlipemia   . Hypertension   . IBS (irritable bowel syndrome)   . Mandibular fracture ([place]) 12/04/2015  . MI (myocardial infarction) ([place])   . Skin cancer of face     Past Surgical History:  Procedure Laterality Date  . BREAST SURGERY    . CATARACT EXTRACTION W/ INTRAOCULAR LENS  IMPLANT, BILATERAL Bilateral   . CHOLECYSTECTOMY OPEN  1970's  . MASTECTOMY Bilateral     History reviewed. No pertinent family history.  Social History:  reports that she has never smoked. She has never used smokeless tobacco. She reports that she does not drink alcohol or use drugs.   Review of Systems   Lipid history:  She is on lovastatin 20 mg from her PCP With the following results    Lab Results  Component Value Date   CHOL 146 03/17/2018   HDL 52.30 03/17/2018   LDLCALC 71 03/17/2018   TRIG 114.0 03/17/2018   CHOLHDL 3 03/17/2018           Hypertension:  She is only on metoprolol 25 mg, Has history of CAD  HYPERKALEMIA: She has not had any recurrence as follows:   Lab Results  Component Value Date   CREATININE 1.37 (H) 04/20/2018   BUN 35 (H) 04/20/2018   NA 143 04/20/2018   K 4.3 04/20/2018   CL 105 04/20/2018   CO2 27 04/20/2018    Most recent eye exam was 07/2016, reportedly no retinopathy  Most recent foot exam: 11/05/17  She is complaining of  tingling and discomfort in her hands She also has more numbness in the hands and she is finding this to be troublesome, this is not any certain time of the day  Previously was asked to take gabapentin more frequently than once a day but she is not remembering to do this She has some difficulty gripping with her hands occasionally No significant numbness or tingling in her  feet    Physical Examination:  BP 112/60 (BP Location: Right Arm, Patient Position: Sitting, Cuff Size: Normal)   Pulse 81   Ht _0  (1.499 m)   Wt 122 lb (55.3 kg)   SpO2 91%   BMI 24.64 kg/m   Tinel sign does not appear to be significantly positive, mildly positive on the left No atrophy of thenar muscles  She has 2+ pedal edema present    ASSESSMENT:  Diabetes type 2, uncontrolled, nonobese, insulin-requiring  See history of present illness for detailed discussion of current diabetes management, blood sugar patterns and problems identified   She is on basal bolus insulin regimen with failure to respond to oral agents  Her A1c is 6.8 which is excellent  She has fairly stable fasting readings with Tyler Aas Also postprandial readings at night are reasonably controlled with average 172 Although her control is variable based on her diet she does not remember to adjust her NovoLog dose when she is eating a larger meal or desserts in the evening and this was discussed Since morning sugars are mostly over 100 and fairly stable her dose of Tyler Aas appears to be adequate and she can probably remember the dose of 20 units easily also  WEIGHT gain: This is probably from taking insulin alone, currently BMI under 25 We will recheck TSH on the next visit  ?  Neuropathy versus carpal tunnel syndrome: Has more significant symptoms, more in her hands and not on any significant treatment  PEDAL edema: This is likely to be from venous insufficiency and she  needs to have compression stockings  PLAN:   No change in insulin, however she can take 8 units of NovoLog at dinnertime when eating larger meals .  Again discussed differences between basal and bolus insulins and not to confuse the 2  Since she does not have high readings after lunch consistently she will not take NovoLog at that time  Discussed that she should be seen by a hand surgeon or neurologist for her symptoms in the hand  but she does not want to see another physician Since she does not clearly have carpal tunnel syndrome and her symptoms are present during the day also including numbness she can try taking gabapentin up to 4 times a day However she does need to see her PCP for this and other symptoms that she is asking for help with   Patient Instructions  May take Gabapentin 3 x daily for numbness  Call Dr Juleen China   Compression stockings  Novolog 8 Units for larger meals at dinner         Total visit time for evaluation and management of multiple problems and counseling =25 minutes   Elayne Snare 05/26/2018, 3:32 PM   Note: This office note was prepared with Dragon voice recognition system technology. Any transcriptional errors that result from this process are unintentional.

## 2018-06-09 ENCOUNTER — Encounter: Payer: Self-pay | Admitting: Family Medicine

## 2018-06-17 NOTE — Progress Notes (Signed)
Vanessa Gibson is a 84 y.o. female is here for follow up.  History of Present Illness:   Vanessa Gibson, CMA acting as scribe for Dr. Briscoe Gibson.   LOV:FIEPPIR in office for 3 month follow up. She has history of hernia that never caused any problems in the past. She can tell that is is protruding now and has swelling to one side of abdomen. She has not had any problems with bowel movents.   Hand pain: has increased if you touch her hands a certain way pain is so bad she can hold any thing. Feels like "crawling". She has the same problems with her feet. She can have swelling in feet in the morning.   There are no preventive care reminders to display for this patient. Depression screen [facility] 2/9 06/18/2018 08/12/2017 05/18/2017  Decreased Interest _0 Down, Depressed, Hopeless _1 PHQ - 2 Score _2 Altered sleeping _3 Tired, decreased energy 0 3 3  Change in appetite 0 1 2  Feeling bad or failure about yourself  _4 Trouble concentrating _5 Moving slowly or fidgety/restless 2 - 1  Suicidal thoughts 0 0 0  PHQ-9 Score _6 Difficult doing work/chores - Very difficult Very difficult   PMHx, SurgHx, SocialHx, FamHx, Medications, and Allergies were reviewed in the Visit Navigator and updated as appropriate.   Patient Active Problem List   Diagnosis Date Noted  . Bilateral lower extremity edema 06/18/2018  . Primary osteoarthritis of both knees 06/18/2018  . Arthralgia of both hands 06/18/2018  . Chronic cough 06/18/2018  . Hernia of abdominal wall 06/18/2018  . ILD (interstitial lung disease) ([place]) 04/05/2018  . Depression, recurrent ([place]), unhappy in [place] but feels stuck, declines medication or counseling 01/24/2018  . At risk for polypharmacy, due to vitamin overuse, working with patient to "deprescribe" 01/24/2018  . Constipation, uses Miralax prn 01/24/2018  . Diabetic peripheral neuropathy associated with type 2 diabetes mellitus ([place]), on low dose  Neurontin 01/24/2018  . Aortic atherosclerosis ([place]), 2018 CXR 01/24/2018  . Lumbar spine scoliosis 01/24/2018  . Hyperlipidemia associated with type 2 diabetes mellitus ([place]), on statin 01/24/2018  . IBS (irritable bowel syndrome) 01/24/2018  . Uncontrolled type 2 diabetes mellitus with hyperglycemia, with long-term current use of insulin ([place]) 12/02/2017  . Insomnia 06/06/2017  . CKD stage 3 secondary to diabetes ([place]), declines ACE/ARB 05/26/2017  . Osteoporosis, post-menopausal 02/06/2016  . Bilateral hearing loss, refuses hearing aids or further testing 02/06/2016  . Arthritis, takes Tumeric  02/06/2016  . Anemia of chronic disease 12/05/2015  . Protein-calorie malnutrition, weight stable 11/13/2015  . Chronic pancreatitis ([place]) 11/13/2015  . GERD (gastroesophageal reflux disease), on Nexium 11/13/2015  . Hypertension associated with diabetes ([place]), on Lisinopril 11/13/2015  . Allergic rhinitis, Rx Xyzal, flonase, atrovent and has been offered referral to Allergy 11/13/2015   Social History   Tobacco Use  . Smoking status: Never Smoker  . Smokeless tobacco: Never Used  Substance Use Topics  . Alcohol use: No  . Drug use: No   Current Medications and Allergies:   .  acetaminophen (TYLENOL) 325 MG tablet, Take 2 tablets (650 mg total) by mouth every 6 (six) hours as needed for moderate pain or headache., Disp: , Rfl:  .  Alcohol Swabs (B-D SINGLE USE SWABS REGULAR) PADS, USE TO CLEAN FINGER BEFORE CHECKING BLOOD SUGARS AND CLEAN SKIN BEFORE INSULIN INJECTIONS., Disp:  300 each, Rfl: 2 .  Alpha-D-Galactosidase (BEANO PO), Take by mouth as needed (gas). , Disp: , Rfl:  .  Ascorbic Acid (VITAMIN C) 1000 MG tablet, Take 1,000 mg by mouth daily., Disp: , Rfl:  .  aspirin EC 81 MG tablet, Take 81 mg by mouth daily., Disp: , Rfl:  .  Blood Glucose Monitoring Suppl (ACCU-CHEK GUIDE) w/Device KIT, USE AS DIRECTED, Disp: 1 kit, Rfl: 0 .  fexofenadine (ALLEGRA) 180 MG tablet, Take 180 mg by  mouth daily., Disp: , Rfl:  .  fluticasone (FLONASE) 50 MCG/ACT nasal spray, Place 2 sprays into both nostrils daily., Disp: 16 g, Rfl: 0 .  fluticasone furoate-vilanterol (BREO ELLIPTA) 100-25 MCG/INH AEPB, Inhale 1 puff into the lungs daily., Disp: 1 each, Rfl: 0 .  gabapentin (NEURONTIN) 100 MG capsule, Take 1 capsule (100 mg total) by mouth 3 (three) times daily as needed. For tingling hands, Disp: 90 capsule, Rfl: 3 .  glucose blood (ACCU-CHEK GUIDE) test strip, Use to test blood sugar 3-4 times daily, Disp: 200 each, Rfl: 2 .  insulin aspart (NOVOLOG FLEXPEN) 100 UNIT/ML FlexPen, Inject 3 units before breakfast and 4 units before supper (Patient taking differently: Inject 6 units before breakfast and 6 units before supper), Disp: 5 pen, Rfl: 4 .  insulin degludec (TRESIBA) 100 UNIT/ML SOPN FlexTouch Pen, Inject 6 units into the skin once daily, Disp: 18 pen, Rfl: 1 .  Insulin Pen Needle (BD PEN NEEDLE NANO U/F) 32G X 4 MM MISC, USE TO INJECT INSULINS 4 TIMES DAILY., Disp: 100 each, Rfl: 0 .  isosorbide mononitrate (IMDUR) 30 MG 24 hr tablet, Take 1 tablet (30 mg total) by mouth daily. One tablet daily, Disp: 90 tablet, Rfl: 1 .  lisinopril (PRINIVIL,ZESTRIL) 5 MG tablet, Take 1 tablet (5 mg total) by mouth daily., Disp: 90 tablet, Rfl: 1 .  lovastatin (MEVACOR) 20 MG tablet, Take 1 tablet (20 mg total) by mouth at bedtime., Disp: 90 tablet, Rfl: 3 .  magnesium oxide (MAG-OX) 400 MG tablet, Take 400 mg by mouth daily. , Disp: , Rfl:  .  metoprolol tartrate (LOPRESSOR) 25 MG tablet, Take 1 tablet (25 mg total) by mouth 2 (two) times daily., Disp: 180 tablet, Rfl: 2 .  mirtazapine (REMERON) 15 MG tablet, Take 0.5 tablets (7.5 mg total) by mouth at bedtime., Disp: 30 tablet, Rfl: 2 .  montelukast (SINGULAIR) 10 MG tablet, Take 1 tablet (10 mg total) by mouth at bedtime., Disp: 30 tablet, Rfl: 3 .  Multiple Vitamins-Minerals (CENTRUM SILVER 50+WOMEN) TABS, Take 1 tablet by mouth daily., Disp: , Rfl:    .  Multiple Vitamins-Minerals (HAIR/SKIN/NAILS/BIOTIN PO), Take 1 tablet by mouth daily., Disp: , Rfl:  .  multivitamin-lutein (OCUVITE-LUTEIN) CAPS capsule, Take 1 capsule by mouth daily., Disp: , Rfl:  .  TRUEPLUS LANCETS 28G MISC, Check blood sugar 3-4 times daily. Dx:, Disp: 100 each, Rfl: 0  No Known Allergies   Review of Systems   Pertinent items are noted in the HPI. Otherwise, ROS is negative.  Vitals:  There were no vitals filed for this visit.   There is no height or weight on file to calculate BMI.  Physical Exam:   Physical Exam  Constitutional: She appears well-nourished.  HENT:  Head: Normocephalic and atraumatic.  Eyes: Pupils are equal, round, and reactive to light. EOM are normal.  Neck: Normal range of motion. Neck supple.  Cardiovascular: Normal rate, regular rhythm, normal heart sounds and intact distal pulses.  Pulmonary/Chest: Effort  normal.  Abdominal: Soft.  Skin: Skin is warm.  Psychiatric: She has a normal mood and affect. Her behavior is normal.  Nursing note and vitals reviewed.   Assessment and Plan:   Vanessa Gibson was seen today for follow-up.  Diagnoses and all orders for this visit:  CKD stage 3 secondary to diabetes ([place]), declines ACE/ARB -     CBC with Differential/Platelet -     Comprehensive metabolic panel  Depression, recurrent ([place]), unhappy in [place] but feels stuck, declines medication or counseling Comments: Remeron helped but caused sedation. She would like to try 1/2 tab. Orders: -     CBC with Differential/Platelet -     Comprehensive metabolic panel -     TSH  Diabetic peripheral neuropathy associated with type 2 diabetes mellitus ([place]), on low dose Neurontin Comments: Discussed increasing the dose, but she would like to hold.  Orders: -     CBC with Differential/Platelet -     Comprehensive metabolic panel -     Vitamin B12  Bilateral lower extremity edema Comments: New. Labs today to prescribe correct dose of diuretic.   Orders: -     CBC with Differential/Platelet -     Comprehensive metabolic panel -     TSH -     Urinalysis, Routine w reflex microscopic -     Brain natriuretic peptide  Primary osteoarthritis of both knees  Arthralgia of both hands Comments: Worsening.   Chronic cough Comments: No change. Orders: -     CBC with Differential/Platelet -     Comprehensive metabolic panel  Hernia of abdominal wall Comments: Mild. No red flags today. Reviewed precautions.  Paresthesia of both hands Comments: Patient is convinced that the paresthesias are from cleaning her apartment while she was worried about mold.  Orders: -     Vitamin B12   . Reviewed expectations re: course of current medical issues. . Discussed self-management of symptoms. . Outlined signs and symptoms indicating need for more acute intervention. . Patient verbalized understanding and all questions were answered. Marland Kitchen Health Maintenance issues including appropriate healthy diet, exercise, and smoking avoidance were discussed with patient. . See orders for this visit as documented in the electronic medical record. . Patient received an After Visit Summary.  Vanessa Deutscher, DO [place] [place] 06/20/2018  Future Appointments  Date Time Provider [place]  06/28/2018 11:45 AM Brand Males, MD [facility] None  08/30/2018  1:30 PM Elayne Snare, MD [facility] None  09/21/2018 10:40 AM Vanessa Deutscher, DO [facility]   CMA served as scribe during this visit. History, Physical, and Plan performed by medical provider. The above documentation has been reviewed and is accurate and complete. Vanessa Gibson, D.O.  Records requested if needed. Time spent with the patient: 45 minutes, of which >50% was spent in obtaining information about her symptoms, reviewing her previous labs, evaluations, and treatments, counseling her about her condition (please see the discussed topics above), and developing a plan to  further investigate it; she had a number of questions which I addressed.

## 2018-06-18 ENCOUNTER — Telehealth: Payer: Self-pay | Admitting: Family Medicine

## 2018-06-18 ENCOUNTER — Other Ambulatory Visit: Payer: Self-pay | Admitting: Family Medicine

## 2018-06-18 ENCOUNTER — Other Ambulatory Visit: Payer: Self-pay | Admitting: Endocrinology

## 2018-06-18 ENCOUNTER — Ambulatory Visit (INDEPENDENT_AMBULATORY_CARE_PROVIDER_SITE_OTHER): Payer: Medicare HMO | Admitting: Family Medicine

## 2018-06-18 ENCOUNTER — Encounter: Payer: Self-pay | Admitting: Family Medicine

## 2018-06-18 DIAGNOSIS — R600 Localized edema: Secondary | ICD-10-CM | POA: Insufficient documentation

## 2018-06-18 DIAGNOSIS — M25541 Pain in joints of right hand: Secondary | ICD-10-CM | POA: Diagnosis not present

## 2018-06-18 DIAGNOSIS — F339 Major depressive disorder, recurrent, unspecified: Secondary | ICD-10-CM | POA: Diagnosis not present

## 2018-06-18 DIAGNOSIS — K439 Ventral hernia without obstruction or gangrene: Secondary | ICD-10-CM | POA: Diagnosis not present

## 2018-06-18 DIAGNOSIS — E1122 Type 2 diabetes mellitus with diabetic chronic kidney disease: Secondary | ICD-10-CM | POA: Diagnosis not present

## 2018-06-18 DIAGNOSIS — R05 Cough: Secondary | ICD-10-CM

## 2018-06-18 DIAGNOSIS — E1142 Type 2 diabetes mellitus with diabetic polyneuropathy: Secondary | ICD-10-CM | POA: Diagnosis not present

## 2018-06-18 DIAGNOSIS — R053 Chronic cough: Secondary | ICD-10-CM | POA: Insufficient documentation

## 2018-06-18 DIAGNOSIS — R202 Paresthesia of skin: Secondary | ICD-10-CM

## 2018-06-18 DIAGNOSIS — M170 Bilateral primary osteoarthritis of knee: Secondary | ICD-10-CM | POA: Diagnosis not present

## 2018-06-18 DIAGNOSIS — R059 Cough, unspecified: Secondary | ICD-10-CM

## 2018-06-18 DIAGNOSIS — N183 Chronic kidney disease, stage 3 unspecified: Secondary | ICD-10-CM

## 2018-06-18 DIAGNOSIS — M25542 Pain in joints of left hand: Secondary | ICD-10-CM

## 2018-06-18 LAB — URINALYSIS, ROUTINE W REFLEX MICROSCOPIC
Bilirubin Urine: NEGATIVE
Hgb urine dipstick: NEGATIVE
Ketones, ur: NEGATIVE
Leukocytes, UA: NEGATIVE
Nitrite: NEGATIVE
Specific Gravity, Urine: 1.02 (ref 1.000–1.030)
Total Protein, Urine: NEGATIVE
Urine Glucose: NEGATIVE
Urobilinogen, UA: 0.2 (ref 0.0–1.0)
pH: 5 (ref 5.0–8.0)

## 2018-06-18 LAB — COMPREHENSIVE METABOLIC PANEL
ALT: 13 U/L (ref 0–35)
AST: 17 U/L (ref 0–37)
Albumin: 3.9 g/dL (ref 3.5–5.2)
Alkaline Phosphatase: 40 U/L (ref 39–117)
BUN: 36 mg/dL — ABNORMAL HIGH (ref 6–23)
CO2: 26 mEq/L (ref 19–32)
Calcium: 9.8 mg/dL (ref 8.4–10.5)
Chloride: 105 mEq/L (ref 96–112)
Creatinine, Ser: 1.38 mg/dL — ABNORMAL HIGH (ref 0.40–1.20)
GFR: 38.38 mL/min — ABNORMAL LOW (ref >60.00)
Glucose, Bld: 134 mg/dL — ABNORMAL HIGH (ref 70–99)
Potassium: 5.4 mEq/L — ABNORMAL HIGH (ref 3.5–5.1)
Sodium: 139 mEq/L (ref 135–145)
Total Bilirubin: 0.4 mg/dL (ref 0.2–1.2)
Total Protein: 6.4 g/dL (ref 6.0–8.3)

## 2018-06-18 LAB — CBC WITH DIFFERENTIAL/PLATELET
Basophils Absolute: 0 10*3/uL (ref 0.0–0.1)
Basophils Relative: 0.6 % (ref 0.0–3.0)
Eosinophils Absolute: 0.2 10*3/uL (ref 0.0–0.7)
Eosinophils Relative: 2.7 % (ref 0.0–5.0)
HCT: 32.6 % — ABNORMAL LOW (ref 36.0–46.0)
Hemoglobin: 11.1 g/dL — ABNORMAL LOW (ref 12.0–15.0)
Lymphocytes Relative: 26.5 % (ref 12.0–46.0)
Lymphs Abs: 2.1 10*3/uL (ref 0.7–4.0)
MCHC: 34.1 g/dL (ref 30.0–36.0)
MCV: 95.3 fl (ref 78.0–100.0)
Monocytes Absolute: 0.7 10*3/uL (ref 0.1–1.0)
Monocytes Relative: 9.3 % (ref 3.0–12.0)
Neutro Abs: 4.9 10*3/uL (ref 1.4–7.7)
Neutrophils Relative %: 60.9 % (ref 43.0–77.0)
Platelets: 230 10*3/uL (ref 150.0–400.0)
RBC: 3.43 Mil/uL — ABNORMAL LOW (ref 3.87–5.11)
RDW: 12.9 % (ref 11.5–15.5)
WBC: 8 10*3/uL (ref 4.0–10.5)

## 2018-06-18 LAB — TSH: TSH: 1.8 u[IU]/mL (ref 0.35–4.50)

## 2018-06-18 LAB — BRAIN NATRIURETIC PEPTIDE: Pro B Natriuretic peptide (BNP): 177 pg/mL — ABNORMAL HIGH (ref 0.0–100.0)

## 2018-06-18 LAB — VITAMIN B12: Vitamin B-12: 553 pg/mL (ref 211–911)

## 2018-06-18 NOTE — Telephone Encounter (Signed)
Left detailed message on personal voicemail that Dr. Juleen China said she told you she is waiting for your lab results to come back to decide wether you need medication for swelling or not . We will get back to you as soon as lab results are back. Any questions please call office.

## 2018-06-18 NOTE — Telephone Encounter (Signed)
the patient wanted to know if she would get any medication for her swelling. At first she spoke about xray's and then she referred to meds for swelling on her legs?

## 2018-06-19 ENCOUNTER — Encounter: Payer: Self-pay | Admitting: Family Medicine

## 2018-06-20 MED ORDER — FUROSEMIDE 20 MG PO TABS: 20.0000 mg | ORAL_TABLET | Freq: Every day | ORAL | 0 refills | Status: DC

## 2018-06-22 ENCOUNTER — Telehealth: Payer: Self-pay | Admitting: Family Medicine

## 2018-06-22 ENCOUNTER — Other Ambulatory Visit: Payer: Self-pay

## 2018-06-22 DIAGNOSIS — R899 Unspecified abnormal finding in specimens from other organs, systems and tissues: Secondary | ICD-10-CM

## 2018-06-22 NOTE — Telephone Encounter (Signed)
Pt called to get lab results. Pt was not scheduled for lab appt for Friday due to the labs not being ordered.

## 2018-06-22 NOTE — Telephone Encounter (Signed)
See note

## 2018-06-22 NOTE — Telephone Encounter (Signed)
Order placed made app for lab with patient.

## 2018-06-25 ENCOUNTER — Other Ambulatory Visit: Payer: Medicare HMO

## 2018-06-25 ENCOUNTER — Other Ambulatory Visit (INDEPENDENT_AMBULATORY_CARE_PROVIDER_SITE_OTHER): Payer: Medicare HMO

## 2018-06-25 DIAGNOSIS — R899 Unspecified abnormal finding in specimens from other organs, systems and tissues: Secondary | ICD-10-CM

## 2018-06-25 LAB — COMPREHENSIVE METABOLIC PANEL
ALT: 21 U/L (ref 0–35)
AST: 23 U/L (ref 0–37)
Albumin: 4.1 g/dL (ref 3.5–5.2)
Alkaline Phosphatase: 42 U/L (ref 39–117)
BUN: 37 mg/dL — ABNORMAL HIGH (ref 6–23)
CO2: 28 mEq/L (ref 19–32)
Calcium: 9.5 mg/dL (ref 8.4–10.5)
Chloride: 102 mEq/L (ref 96–112)
Creatinine, Ser: 1.46 mg/dL — ABNORMAL HIGH (ref 0.40–1.20)
GFR: 35.96 mL/min — ABNORMAL LOW (ref >60.00)
Glucose, Bld: 148 mg/dL — ABNORMAL HIGH (ref 70–99)
Potassium: 4.9 mEq/L (ref 3.5–5.1)
Sodium: 137 mEq/L (ref 135–145)
Total Bilirubin: 0.4 mg/dL (ref 0.2–1.2)
Total Protein: 6.8 g/dL (ref 6.0–8.3)

## 2018-06-28 ENCOUNTER — Other Ambulatory Visit: Payer: Self-pay

## 2018-06-28 ENCOUNTER — Telehealth: Payer: Self-pay | Admitting: Surgical

## 2018-06-28 ENCOUNTER — Other Ambulatory Visit: Payer: Self-pay | Admitting: Family Medicine

## 2018-06-28 ENCOUNTER — Ambulatory Visit (INDEPENDENT_AMBULATORY_CARE_PROVIDER_SITE_OTHER): Payer: Medicare HMO | Admitting: Internal Medicine

## 2018-06-28 ENCOUNTER — Encounter: Payer: Self-pay | Admitting: Internal Medicine

## 2018-06-28 VITALS — BP 118/64 | HR 73 | Ht 59.0 in | Wt 123.4 lb

## 2018-06-28 DIAGNOSIS — R059 Cough, unspecified: Secondary | ICD-10-CM

## 2018-06-28 DIAGNOSIS — R600 Localized edema: Secondary | ICD-10-CM

## 2018-06-28 DIAGNOSIS — Z87898 Personal history of other specified conditions: Secondary | ICD-10-CM

## 2018-06-28 DIAGNOSIS — R05 Cough: Secondary | ICD-10-CM | POA: Diagnosis not present

## 2018-06-28 DIAGNOSIS — R0602 Shortness of breath: Secondary | ICD-10-CM | POA: Diagnosis not present

## 2018-06-28 MED ORDER — FUROSEMIDE 20 MG PO TABS: 20.0000 mg | ORAL_TABLET | Freq: Every day | ORAL | 0 refills | Status: DC

## 2018-06-28 NOTE — Patient Instructions (Addendum)
ICD-10-CM   1. SOB (shortness of breath) R06.02   2. H/O dust exposure Z87.898   3. Cough R05     Glad better Thing appear stable  Plan - for hernia, voice issues and hand pain - please talk to PCP Briscoe Deutscher, DO - regarding mild scar tissue in lung - we will watch this clinically - regarding mild cough and shortness of breath - try inspriatory muscle strenght trainer 10 tmes a day  - buy on [employer] out of pocket  Followup 6 months to regular clinic  - at that time will do  A simple walk test in the office for o2 levels

## 2018-06-28 NOTE — Addendum Note (Signed)
Addended by: Durwin Glaze on: 06/28/2018 02:52 PM   Modules accepted: Orders

## 2018-06-28 NOTE — Telephone Encounter (Signed)
Copied from [place] [phone]. Topic: Quick Communication - See Telephone Encounter >> Jun 28, 2018  3:05 PM Hewitt Shorts wrote: Vanessa Gibson is calling to try and confirm the quanity of furosemide 20mg   they have been only giving her 5 pills and pt is wanting to know if she should be  Getting more   Best number to [place] [phone]

## 2018-06-28 NOTE — Telephone Encounter (Signed)
See note.   Copied from [place] [phone]. Topic: General - Other >> Jun 28, 2018 12:29 PM Carolyn Stare wrote:  Pt said she has finish the med and she said her feet is swelling again and is asking for a refill    furosemide (LASIX) 20 MG tablet   [facility]

## 2018-06-28 NOTE — Telephone Encounter (Signed)
Please advise on refill.

## 2018-06-28 NOTE — Progress Notes (Signed)
Subjective:     Patient ID: Vanessa Gibson, female   DOB: Jul 16, 1928, 84 y.o.   MRN: [id number]  HPI PCP Briscoe Deutscher, DO   HPI  IOV 03/19/2018  Chief Complaint  Patient presents with  . Consult    Referred by Dr. Juleen China due to SOB.  Pt states symptoms started 5 months ago, is coughing, has some hoarseness, swelling in feet and is also losing feeling in both hands and feets. Pt also has some pain in back and in neck.   [name] female 84 y.o. [address] [place] [address]  84 year old female.  On-year-old female.  Originally an immigrant from [place] 84 year old female originally an immigrant from [place].  She moved to the [place] > 70 years ago pre[place]. Says some 5 months ago due to living alone relocated from home to [address] [place] apartments.says when she arrived at [facility] green the room was dusty extremely. She then immediately started feeling short of breath and chest tightness and cough. She then relocated to another apartment but symptoms are still persistent. Mostly she feels chest tightness in the front of the chest and back of the chest. She also says that associated with this there is some hearing loss and nail pain. She is having significant cough that is improved. In March 2019 she had a chest x-ray personally visualized that was clear except for scoliosis. She is rating his symptoms as severe. She is extremely concerned about the symptoms walking desaturation test 185 feet 3 laps on room air. Resting heart rate was 99% she walked all 3 laps and got tired and mildly short of breath. Final pulse ox was 98%. Heart rate at rest was 72/m. She got tachycardic 90/m. Currently she is not having much cough. Feno 12ppb and normal  She also showig some gravel and says that is the dust she is exposed to and wants it tested. Frustrated I cannot test it She is frustrated that I Am not able tooffer a quick fix for her 5 month symptoms She is furstrated I am  recommending empiric trial Asking for admission     04/05/2018 Follow Up: Pt. Was seen as consult by Dr. Chase Caller 4/26/219 for dyspnea and cough. Dr Chase Caller was unsure of her symptoms. ? Reactive airway v VCD to dust at new environment. Currently feno and walk test are normal. No crackles either. She is furstrated there is no quick fix to her 5 months of shortness of breath. . He started her on Breo as a therapeutic trial for her dyspnea, ordered a HRCT, and Pre-bd spiro and dlco only. Last Echo did show slightly elevated PAP pressure ( 37 mmHg) Pt. Presents for follow up: Pt. States she did not take the [facility] prescribed by Dr. Chase Caller.. Therefore she has not noticed any change in her breathing.She states she was unsure how to use the device. She was given instruction by nurse today  She is now complaining of something in her throat.She states she has a non-productive cough.She states her cough is better because she is taking medication. She is using Allegra, Flonase and Singulair. She states she does have swelling in her ankles. She states she has orthopnea, and a difficulty sleeping flat.She has multiple complaints that are not pulmonary related, and we are frequently re-directing her.She states her  dyspnea and cough are a bit better ( Despite not using her Breo), but not gone. We discussed the need for medical compliance. We reviewed her PFT's, and plan for further  work up. She denies fever, chest pain, orthopnea or hemoptysis.  .   Test Results: HRCT 03/30/2018 Minimal patchy subpleural reticulation and ground-glass attenuation in the lower lobes. These findings are nonspecific and could be due to hypoventilatory change or nonspecific scarring, with the possibility of a mild interstitial lung disease such as nonspecific interstitial pneumonia (NSIP) or early usual interstitial pneumonia (UIP) not entirely excluded. A follow-up high-resolution chest CT study may be obtained in 12 months  to assess temporal pattern stability, as clinically warranted. 2. Mild patchy air trapping in both lungs, indicative of small airways disease. 3. Small hiatal hernia. 4. Left main and 3 vessel coronary atherosclerosis.  PFT's 04/05/2018 FVC 1.83 : 108%predicted FEV1 1.61: 131% pred F/F Ratio 88: 117% predicted DLCO 67% Isolated reduced diffusion capacity Results for ANISIA, LEIJA (MRN [id number]) as of 06/28/2018 11:55  Ref. Range 04/05/2018 12:13  Anti Nuclear Antibody(ANA) Latest Ref Range: Negative  Negative  Angiotensin-Converting Enzyme Latest Ref Range: 9 - 67 U/L 14  Cyclic Citrullin Peptide Ab Latest Units: UNITS <16  ds DNA Ab Latest Units: IU/mL 1  Myeloperoxidase Abs Latest Units: AI <1.0  Serine Protease 3 Latest Units: AI <1.0  RA Latex Turbid. Latest Ref Range: <14 IU/mL <14  Ribonucleic Protein(ENA) Antibody, IgG Latest Ref Range: <1.0 NEG AI <1.0 NEG  SSA (Ro) (ENA) Antibody, IgG Latest Ref Range: <1.0 NEG AI <1.0 NEG  SSB (La) (ENA) Antibody, IgG Latest Ref Range: <1.0 NEG AI <1.0 NEG  Scleroderma (Scl-70) (ENA) Antibody, IgG Latest Ref Range: <1.0 NEG AI <1.0 NEG    OV 06/28/2018   Chief Complaint  Patient presents with  . Follow-up    Pt states she is feeling better since last visit. She states due to inhaling fumes from her building over a year, she has lost her voice and she has had some burning from it as well. Pt states she does have a cough and states it is a "whistle".    84 year old female with isolated reduction in diffusion capacity and possible NSIP. - multiplesomatic complaints. Attributes everything to [place] exposure new apartment in  2019  Since last visit she says shortness of breath is better but she still has cough that is dry and occasionally with a whistling sound. Overall symptoms are mild to moderate. She spent most of the time discussing non-respiratory issues particularly focusing on her abdominal hernia. She is status post multiple GI  surgeries. She also talked about hand pain that happened after the move to the new apartment in the dust exposure there. She also talked about voice issues.   has a past medical history of Anxiety, Arthritis, Asthma, Breast cancer ([place]), bilateral, Chronic pancreatitis ([place]), Depression, Dizziness, GERD (gastroesophageal reflux disease), History of hiatal hernia, Hyperlipemia, Hypertension, IBS (irritable bowel syndrome), Mandibular fracture ([place]) (12/04/2015), MI (myocardial infarction) ([place]), and Skin cancer of face.   reports that she has never smoked. She has never used smokeless tobacco.  Past Surgical History:  Procedure Laterality Date  . BREAST SURGERY    . CATARACT EXTRACTION W/ INTRAOCULAR LENS  IMPLANT, BILATERAL Bilateral   . CHOLECYSTECTOMY OPEN  1970's  . MASTECTOMY Bilateral     No Known Allergies  Immunization History  Administered Date(s) Administered  . Influenza Whole 08/03/2016  . Influenza, High Dose Seasonal PF 08/09/2015  . Influenza-Unspecified 08/17/2017  . Zoster 01/17/2016    No family history on file.   Current Outpatient Medications:  .  acetaminophen (TYLENOL) 325 MG tablet, Take 2 tablets (650  mg total) by mouth every 6 (six) hours as needed for moderate pain or headache., Disp: , Rfl:  .  Ascorbic Acid (VITAMIN C) 1000 MG tablet, Take 1,000 mg by mouth daily., Disp: , Rfl:  .  aspirin EC 81 MG tablet, Take 81 mg by mouth daily., Disp: , Rfl:  .  fluticasone (FLONASE) 50 MCG/ACT nasal spray, Place 2 sprays into both nostrils daily., Disp: 16 g, Rfl: 0 .  furosemide (LASIX) 20 MG tablet, Take 1 tablet (20 mg total) by mouth daily., Disp: 5 tablet, Rfl: 0 .  gabapentin (NEURONTIN) 100 MG capsule, Take 1 capsule (100 mg total) by mouth 3 (three) times daily as needed. For tingling hands, Disp: 90 capsule, Rfl: 3 .  insulin aspart (NOVOLOG FLEXPEN) 100 UNIT/ML FlexPen, Inject 3 units before breakfast and 4 units before supper (Patient taking differently:  Inject 6 units before breakfast and 8 units before supper), Disp: 5 pen, Rfl: 4 .  insulin degludec (TRESIBA) 100 UNIT/ML SOPN FlexTouch Pen, Inject 6 units into the skin once daily (Patient taking differently: Inject 20 units into the skin once daily), Disp: 18 pen, Rfl: 1 .  isosorbide mononitrate (IMDUR) 30 MG 24 hr tablet, Take 1 tablet (30 mg total) by mouth daily. One tablet daily, Disp: 90 tablet, Rfl: 1 .  lisinopril (PRINIVIL,ZESTRIL) 5 MG tablet, Take 1 tablet (5 mg total) by mouth daily., Disp: 90 tablet, Rfl: 1 .  lovastatin (MEVACOR) 20 MG tablet, Take 1 tablet (20 mg total) by mouth at bedtime., Disp: 90 tablet, Rfl: 3 .  metoprolol tartrate (LOPRESSOR) 25 MG tablet, Take 1 tablet (25 mg total) by mouth 2 (two) times daily., Disp: 180 tablet, Rfl: 2 .  mirtazapine (REMERON) 15 MG tablet, Take 0.5 tablets (7.5 mg total) by mouth at bedtime., Disp: 30 tablet, Rfl: 2 .  montelukast (SINGULAIR) 10 MG tablet, TAKE ONE TABLET AT BEDTIME., Disp: 30 tablet, Rfl: 6 .  Multiple Vitamins-Minerals (CENTRUM SILVER 50+WOMEN) TABS, Take 1 tablet by mouth daily., Disp: , Rfl:  .  Multiple Vitamins-Minerals (HAIR/SKIN/NAILS/BIOTIN PO), Take 1 tablet by mouth daily., Disp: , Rfl:  .  multivitamin-lutein (OCUVITE-LUTEIN) CAPS capsule, Take 1 capsule by mouth daily., Disp: , Rfl:     Review of Systems     Objective:   Physical Exam  Vitals:   06/28/18 1146  BP: 118/64  Pulse: 73  SpO2: 96%  Weight: 123 lb 6.4 oz (56 kg)  Height: 4\' 11"  (1.499 m)    Estimated body mass index is 24.92 kg/m as calculated from the following:   Height as of this encounter: 4\' 11"  (1.499 m).   Weight as of this encounter: 123 lb 6.4 oz (56 kg).    General Appearance:    Lean, frail  Head:    Normocephalic, without obvious abnormality, atraumatic  Eyes:    PERRL - yes, conjunctiva/corneas - clear      Ears:    Normal external ear canals, both ears  Nose:   NG tube - no  Throat:  ETT TUBE - no , OG tube -  no  Neck:   Supple,  No enlargement/tenderness/nodules     Lungs:     Clear to auscultation bilaterally, ? Mild basal crackles  Chest wall:    No deformity  Heart:    S1 and S2 normal, no murmur, CVP - no.  Pressors - no  Abdomen:     Soft, no masses, no organomegaly.,. Has scars, Incisional hernia  Genitalia:  Not done  Rectal:   not done  Extremities:   Extremities- intact     Skin:   Intact in exposed areas      Neurologic:   Sedation - none -> RASS - +1 . Moves all 4s - yes. CAM-ICU - neg . Orientation - x3+        Assessment:       ICD-10-CM   1. SOB (shortness of breath) R06.02   2. H/O dust exposure Z87.898   3. Cough R05        Plan:        Glad better Thing appear stable  Plan - for hernia, voice issues and hand pain - please talk to PCP Briscoe Deutscher, DO - regarding mild scar tissue in lung - we will watch this clinically - regarding mild cough and shortness of breath - try inspriatory muscle strenght trainer 10 tmes a day  - buy on [employer] out of pocket  Followup 6 months to regular clinic  - at that time will do  A simple walk test in the office for o2 levels HRCT in 1 year  (> 50% of this 15 min visit spent in face to face counseling or/and coordination of care by this undersigned MD - Dr Brand Males. This includes one or more of the following documented above: discussion of test results, diagnostic or treatment recommendations, prognosis, risks and benefits of management options, instructions, education, compliance or risk-factor reduction)  Dr. Brand Males, M.D., [facility].C.P Pulmonary and Critical Care Medicine Staff Physician, [place] Director - Interstitial Lung Disease  Program  Pulmonary [place] [facility] [place], [place], [address]  Pager: [phone], If no answer or between  15:00h - 7:00h: call [phone] Telephone: [phone]

## 2018-06-28 NOTE — Telephone Encounter (Signed)
Spoke with the pharmacy and let them know that Dr. Juleen China is only giving this as temporary medication. If the patient would like to stay on this she would need to come in to be seen for BP and lab check.

## 2018-06-28 NOTE — Telephone Encounter (Signed)
Patient called and wants to know which one is her potassium pill? She said she is taking so many medications that she does not know

## 2018-06-28 NOTE — Telephone Encounter (Signed)
Prescription sent to the pharmacy. Left message on patients voice mail that prescription has been sent to pharmacy and to take the potassium with it.

## 2018-06-28 NOTE — Telephone Encounter (Signed)
Okay refill make sure she is taking potassium

## 2018-06-29 ENCOUNTER — Other Ambulatory Visit: Payer: Medicare HMO

## 2018-07-09 ENCOUNTER — Ambulatory Visit: Payer: Self-pay

## 2018-07-09 NOTE — Telephone Encounter (Signed)
Phone call returned to pt.  Pt. had multiple complaints during Triage call.  Voiced concern of Mirtazapine being "too strong", and interacting with her "mood pill."  Stated she has not taken her Mirtazapine for 2 or 3 nights. Stated she feels that she sleeps too much, when she takes the Mirtazapine, and doesn't sleep well, when she holds it.  C/o feeling light-headed.  Stated she feels wobbly and off balance; this has been an ongoing feeling "for awhile".  Stated she has an intermittent numbness and burning  of all her fingers.  Stated she has reported this before.  Also, c/o intermittent sharp right leg pain; can occur anywhere in her leg.  Reported she gets very blurred vision on occasion.  Reported she walked into an area where they were repairing something in the ceiling about 2 years ago, and came in contact with a dust, and feels she has never been the same since.  Stated she developed Asthma after that exposure.  Stated "I have even shown the material to doctors and they look at me like I'm crazy."  Stated "nobody believes me."  Stated "I have many things wrong with me, and blame it on that dust that I walked into, and wish I could get this checked out."  Reported feeling that her nerves affect her ability to focus on something.  Offered appt. With PCP on 8/19; declined due to transportation.  Reported she has to give 72 hrs. Notice, for transportation to appts.  Pt. accepted an appt. On 07/14/18 @ 11:00 AM with PCP.  Care advice given per Dizziness protocol.  Verb. Understanding.   Reason for Disposition . [1] MODERATE dizziness (e.g., interferes with normal activities) AND [2] has been evaluated by physician for this  Answer Assessment - Initial Assessment Questions 1. DESCRIPTION: "Describe your dizziness."     Feels like if she sits down she will fall asleep 2. LIGHTHEADED: "Do you feel lightheaded?" (e.g., somewhat faint, woozy, weak upon standing)     Feels wobbly and off balance most of time;  has been going on a long time  3. VERTIGO: "Do you feel like either you or the room is spinning or tilting?" (i.e. vertigo)     "Not too often" 4. SEVERITY: "How bad is it?"  "Do you feel like you are going to faint?" "Can you stand and walk?"   - MILD - walking normally   - MODERATE - interferes with normal activities (e.g., work, school)    - SEVERE - unable to stand, requires support to walk, feels like passing out now.      moderate 5. ONSET:  "When did the dizziness begin?"     Has been going on for a long while  6. AGGRAVATING FACTORS: "Does anything make it worse?" (e.g., standing, change in head position)     Change in head position 7. HEART RATE: "Can you tell me your heart rate?" "How many beats in 15 seconds?"  (Note: not all patients can do this)       Did not ask this 8. CAUSE: "What do you think is causing the dizziness?"    Wonders if her medication is too strong 9. RECURRENT SYMPTOM: "Have you had dizziness before?" If so, ask: "When was the last time?" "What happened that time?"     This occurs on a daily basis; wondering if related to medication 10. OTHER SYMPTOMS: "Do you have any other symptoms?" (e.g., fever, chest pain, vomiting, diarrhea, bleeding)  Very blurred vision on occasion, denied headache; c/o numbness and burning in fingers; c/o intermittent, sharp right leg pain.  Protocols used: DIZZINESS [facility]

## 2018-07-14 ENCOUNTER — Other Ambulatory Visit: Payer: Self-pay | Admitting: Endocrinology

## 2018-07-14 ENCOUNTER — Encounter: Payer: Self-pay | Admitting: Family Medicine

## 2018-07-14 ENCOUNTER — Ambulatory Visit (INDEPENDENT_AMBULATORY_CARE_PROVIDER_SITE_OTHER): Payer: Medicare HMO | Admitting: Family Medicine

## 2018-07-14 VITALS — BP 124/60 | HR 79 | Temp 99.0°F | Ht 59.0 in | Wt 125.2 lb

## 2018-07-14 DIAGNOSIS — M542 Cervicalgia: Secondary | ICD-10-CM

## 2018-07-14 DIAGNOSIS — R600 Localized edema: Secondary | ICD-10-CM

## 2018-07-14 DIAGNOSIS — K219 Gastro-esophageal reflux disease without esophagitis: Secondary | ICD-10-CM | POA: Diagnosis not present

## 2018-07-14 DIAGNOSIS — E1142 Type 2 diabetes mellitus with diabetic polyneuropathy: Secondary | ICD-10-CM | POA: Diagnosis not present

## 2018-07-14 DIAGNOSIS — N183 Type 2 diabetes mellitus with diabetic chronic kidney disease: Secondary | ICD-10-CM

## 2018-07-14 DIAGNOSIS — E1122 Type 2 diabetes mellitus with diabetic chronic kidney disease: Secondary | ICD-10-CM

## 2018-07-14 MED ORDER — OMEPRAZOLE 40 MG PO CPDR: 40.0000 mg | DELAYED_RELEASE_CAPSULE | Freq: Every day | ORAL | 3 refills | Status: DC

## 2018-07-14 MED ORDER — POTASSIUM CHLORIDE ER 10 MEQ PO TBCR: 10.0000 meq | EXTENDED_RELEASE_TABLET | Freq: Every day | ORAL | 3 refills | Status: DC

## 2018-07-14 MED ORDER — FUROSEMIDE 20 MG PO TABS: 20.0000 mg | ORAL_TABLET | Freq: Every day | ORAL | 3 refills | Status: DC

## 2018-07-14 MED ORDER — GABAPENTIN 100 MG PO CAPS: 100.0000 mg | ORAL_CAPSULE | Freq: Every day | ORAL | 3 refills | Status: DC

## 2018-07-14 NOTE — Progress Notes (Signed)
Vanessa Gibson is a 84 y.o. female is here for follow up.  History of Present Illness:   Vanessa Gibson, CMA acting as scribe for Dr. Briscoe Deutscher.   HPI: She states that she is concerned about some medications that she is taking reacting with each other. She is taking the Mirtazapine only as needed. She only took one time last week feels like she is to sleepy the next day.   She also is taking the Gabapentin 100mg  three times a day. She does not feel that it is helping with the pain. She is having pain in her neck, hands and feet daily. She takes the Gabapentin and tylenol daily with no improvement. The pain is burning with numbness in both hands. She rates the pain right now a 3/10 that can get to 8/10 at times. Her hands feel like something is running all over them all the time and are stiff. She was given Lasix 20mg  but was only given 5 tablets. She states that the swelling is much better but is having a lot of pain in right leg. The pain in leg is stabbing pain that makes it hard for her to walk down stairs.   Health Maintenance Due  Topic Date Due  . INFLUENZA VACCINE  06/24/2018   Depression screen [facility] 2/9 06/18/2018 08/12/2017 05/18/2017  Decreased Interest 3 2 2   Down, Depressed, Hopeless 2 2 2   PHQ - 2 Score 5 4 4   Altered sleeping 2 3 3   Tired, decreased energy 0 3 3  Change in appetite 0 1 2  Feeling bad or failure about yourself  3 2 2   Trouble concentrating 2 3 3   Moving slowly or fidgety/restless 2 - 1  Suicidal thoughts 0 0 0  PHQ-9 Score 14 16 18   Difficult doing work/chores - Very difficult Very difficult   PMHx, SurgHx, SocialHx, FamHx, Medications, and Allergies were reviewed in the Visit Navigator and updated as appropriate.   Patient Active Problem List   Diagnosis Date Noted  . Bilateral lower extremity edema 06/18/2018  . Primary osteoarthritis of both knees 06/18/2018  . Arthralgia of both hands 06/18/2018  . Chronic cough 06/18/2018  . Hernia of abdominal  wall 06/18/2018  . ILD (interstitial lung disease) ([place]) 04/05/2018  . Depression, recurrent ([place]), unhappy in [place] but feels stuck, declines medication or counseling 01/24/2018  . At risk for polypharmacy, due to vitamin overuse, working with patient to "deprescribe" 01/24/2018  . Constipation, uses Miralax prn 01/24/2018  . Diabetic peripheral neuropathy associated with type 2 diabetes mellitus ([place]), on low dose Neurontin 01/24/2018  . Aortic atherosclerosis ([place]), 2018 CXR 01/24/2018  . Lumbar spine scoliosis 01/24/2018  . Hyperlipidemia associated with type 2 diabetes mellitus ([place]), on statin 01/24/2018  . IBS (irritable bowel syndrome) 01/24/2018  . Uncontrolled type 2 diabetes mellitus with hyperglycemia, with long-term current use of insulin ([place]) 12/02/2017  . Insomnia 06/06/2017  . CKD stage 3 secondary to diabetes ([place]), on Lisinopril 05/26/2017  . Osteoporosis, post-menopausal 02/06/2016  . Bilateral hearing loss, refuses hearing aids or further testing 02/06/2016  . Arthritis, takes Tumeric  02/06/2016  . Anemia of chronic disease 12/05/2015  . Protein-calorie malnutrition, weight stable 11/13/2015  . Chronic pancreatitis ([place]) 11/13/2015  . GERD (gastroesophageal reflux disease), on Nexium 11/13/2015  . Hypertension associated with diabetes ([place]), on Lisinopril 11/13/2015  . Allergic rhinitis, Rx Xyzal, flonase, atrovent and has been offered referral to Allergy 11/13/2015   Social History   Tobacco Use  .  Smoking status: Never Smoker  . Smokeless tobacco: Never Used  Substance Use Topics  . Alcohol use: No  . Drug use: No   Current Medications and Allergies:   .  acetaminophen (TYLENOL) 325 MG tablet, Take 2 tablets (650 mg total) by mouth every 6 (six) hours as needed for moderate pain or headache., Disp: , Rfl:  .  Ascorbic Acid (VITAMIN C) 1000 MG tablet, Take 1,000 mg by mouth daily., Disp: , Rfl:  .  aspirin EC 81 MG tablet, Take 81 mg by mouth daily., Disp: ,  Rfl:  .  fluticasone (FLONASE) 50 MCG/ACT nasal spray, Place 2 sprays into both nostrils daily., Disp: 16 g, Rfl: 0 .  gabapentin (NEURONTIN) 100 MG capsule, Take 1 capsule (100 mg total) by mouth at bedtime. For tingling hands, Disp: 90 capsule, Rfl: 3 .  insulin aspart (NOVOLOG FLEXPEN) 100 UNIT/ML FlexPen, Inject 3 units before breakfast and 4 units before supper (Patient taking differently: Inject 6 units before breakfast and 8 units before supper), Disp: 5 pen, Rfl: 4 .  insulin degludec (TRESIBA) 100 UNIT/ML SOPN FlexTouch Pen, Inject 6 units into the skin once daily (Patient taking differently: Inject 20 units into the skin once daily), Disp: 18 pen, Rfl: 1 .  isosorbide mononitrate (IMDUR) 30 MG 24 hr tablet, Take 1 tablet (30 mg total) by mouth daily. One tablet daily, Disp: 90 tablet, Rfl: 1 .  lisinopril (PRINIVIL,ZESTRIL) 5 MG tablet, Take 1 tablet (5 mg total) by mouth daily., Disp: 90 tablet, Rfl: 1 .  lovastatin (MEVACOR) 20 MG tablet, Take 1 tablet (20 mg total) by mouth at bedtime., Disp: 90 tablet, Rfl: 3 .  metoprolol tartrate (LOPRESSOR) 25 MG tablet, Take 1 tablet (25 mg total) by mouth 2 (two) times daily., Disp: 180 tablet, Rfl: 2 .  montelukast (SINGULAIR) 10 MG tablet, TAKE ONE TABLET AT BEDTIME., Disp: 30 tablet, Rfl: 6 .  Multiple Vitamins-Minerals (CENTRUM SILVER 50+WOMEN) TABS, Take 1 tablet by mouth daily., Disp: , Rfl:  .  Multiple Vitamins-Minerals (HAIR/SKIN/NAILS/BIOTIN PO), Take 1 tablet by mouth daily., Disp: , Rfl:  .  multivitamin-lutein (OCUVITE-LUTEIN) CAPS capsule, Take 1 capsule by mouth daily., Disp: , Rfl:  .  furosemide (LASIX) 20 MG tablet, Take 1 tablet (20 mg total) by mouth daily., Disp: 30 tablet, Rfl: 3 .  omeprazole (PRILOSEC) 40 MG capsule, Take 1 capsule (40 mg total) by mouth daily., Disp: 30 capsule, Rfl: 3 .  potassium chloride (K-DUR) 10 MEQ tablet, Take 1 tablet (10 mEq total) by mouth daily., Disp: 30 tablet, Rfl: 3  No Known Allergies    Review of Systems   Pertinent items are noted in the HPI. Otherwise, ROS is negative.  Vitals:   Vitals:   07/14/18 1039  BP: 124/60  Pulse: 79  Temp: 99 F (37.2 C)  TempSrc: Oral  SpO2: 96%  Weight: 125 lb 3.2 oz (56.8 kg)  Height: 4\' 11"  (1.499 m)     Body mass index is 25.29 kg/m.  Physical Exam:   Physical Exam  Constitutional: She appears well-nourished.  HENT:  Head: Normocephalic and atraumatic.  Eyes: Pupils are equal, round, and reactive to light. EOM are normal.  Neck: Normal range of motion. Neck supple.  Cardiovascular: Normal rate, regular rhythm, normal heart sounds and intact distal pulses.  Pulmonary/Chest: Effort normal.  Abdominal: Soft.  Skin: Skin is warm.  Psychiatric: She has a normal mood and affect. Her behavior is normal.  Nursing note and vitals reviewed.  Assessment and Plan:   [place] was seen today for follow-up.  Diagnoses and all orders for this visit:  Diabetic peripheral neuropathy associated with type 2 diabetes mellitus ([place]), on low dose Neurontin -     gabapentin (NEURONTIN) 100 MG capsule; Take 1 capsule (100 mg total) by mouth at bedtime. For tingling hands  CKD stage 3 secondary to diabetes ([place]), declines ACE/ARB  Bilateral lower extremity edema -     furosemide (LASIX) 20 MG tablet; Take 1 tablet (20 mg total) by mouth daily. -     potassium chloride (K-DUR) 10 MEQ tablet; Take 1 tablet (10 mEq total) by mouth daily.  Neck pain on left side -     Ambulatory referral to Physical Therapy  Gastroesophageal reflux disease, esophagitis presence not specified -     omeprazole (PRILOSEC) 40 MG capsule; Take 1 capsule (40 mg total) by mouth daily.    . Reviewed expectations re: course of current medical issues. . Discussed self-management of symptoms. . Outlined signs and symptoms indicating need for more acute intervention. . Patient verbalized understanding and all questions were answered. Marland Kitchen Health Maintenance issues  including appropriate healthy diet, exercise, and smoking avoidance were discussed with patient. . See orders for this visit as documented in the electronic medical record. . Patient received an After Visit Summary.  CMA served as Education administrator during this visit. History, Physical, and Plan performed by medical provider. The above documentation has been reviewed and is accurate and complete. Briscoe Deutscher, D.O.  Briscoe Deutscher, DO [place], [place] [facility] 07/19/2018

## 2018-07-19 ENCOUNTER — Other Ambulatory Visit: Payer: Self-pay | Admitting: Endocrinology

## 2018-07-22 ENCOUNTER — Other Ambulatory Visit: Payer: Self-pay | Admitting: Family Medicine

## 2018-07-22 ENCOUNTER — Telehealth: Payer: Self-pay | Admitting: Family Medicine

## 2018-07-22 NOTE — Telephone Encounter (Signed)
Do you know anything about this? 

## 2018-07-22 NOTE — Telephone Encounter (Signed)
Patient called back in regards to the PT referral and refused the PT here at the office. Patient stated she wants a PT or nurse to come to her home. Please contact patient to get clarification on what referral she is needing as patient wanted me to close the current referral for PT due to not wanting to come.

## 2018-07-22 NOTE — Telephone Encounter (Signed)
LVM for patient to schedule PT referral

## 2018-07-22 NOTE — Telephone Encounter (Signed)
Vanessa Gibson please contact patient to schedule PT referral.   Dr. Alcario Drought team please see note regarding the stocking request.   Copied from [place] [phone]. Topic: Quick Communication - See Telephone Encounter >> Jul 22, 2018  1:16 PM Vanessa Gibson wrote: CRM for notification. See Telephone encounter for: 07/22/18.  pt called in to be advised. Pt says that she is checking the status of her PT referral and ALSO, pt says in her last ov provider stated that she was going to provide pt with a stocking for her leg. Pt would like to be advised on how she is suppose to get it?

## 2018-07-23 ENCOUNTER — Other Ambulatory Visit: Payer: Self-pay | Admitting: Endocrinology

## 2018-07-23 ENCOUNTER — Telehealth: Payer: Self-pay

## 2018-07-23 NOTE — Telephone Encounter (Signed)
Pt stated that she mistakenly took her tresiba 6 units this morning instead of her novolin 6 units and pt would like to know what she should do?

## 2018-07-23 NOTE — Telephone Encounter (Signed)
PT at ALF. Give information for compression stockings. Okay to Rx BUT easily obtained OTC.

## 2018-07-23 NOTE — Telephone Encounter (Signed)
She will need to take the rest of her 14 units Tyler Aas but if she has already had breakfast she will not take NovoLog at this time

## 2018-07-23 NOTE — Telephone Encounter (Signed)
Pt stated that she understood instructions 

## 2018-07-27 NOTE — Telephone Encounter (Signed)
Can you take care of this I am working with another provider today

## 2018-07-27 NOTE — Telephone Encounter (Signed)
Patient called back and I gave her the message. She stated that she would go get the compression hose. Patient is at [facility] living facility so I will contact them for PT. Patient states that she is still not feeling well. She stated that she is still having a lot of fatigue. I offered patient appointment with Aldona Bar.Patient refused appointment.

## 2018-07-27 NOTE — Telephone Encounter (Addendum)
Left message for patient to return call back to let her know that she can get compression hose over the counter. Insurance does not pay for these usually. We also need to find out if she is at a assisted living facility.

## 2018-07-28 NOTE — Telephone Encounter (Signed)
I have spoke with Vikki Ports at [facility] facility. I have faxed order to the facility.

## 2018-07-29 DIAGNOSIS — C44311 Basal cell carcinoma of skin of nose: Secondary | ICD-10-CM | POA: Diagnosis not present

## 2018-07-29 DIAGNOSIS — L821 Other seborrheic keratosis: Secondary | ICD-10-CM | POA: Diagnosis not present

## 2018-07-29 DIAGNOSIS — L309 Dermatitis, unspecified: Secondary | ICD-10-CM | POA: Diagnosis not present

## 2018-07-29 DIAGNOSIS — M6740 Ganglion, unspecified site: Secondary | ICD-10-CM | POA: Diagnosis not present

## 2018-07-29 DIAGNOSIS — Z85828 Personal history of other malignant neoplasm of skin: Secondary | ICD-10-CM | POA: Diagnosis not present

## 2018-08-03 DIAGNOSIS — R2681 Unsteadiness on feet: Secondary | ICD-10-CM | POA: Diagnosis not present

## 2018-08-03 DIAGNOSIS — M542 Cervicalgia: Secondary | ICD-10-CM | POA: Diagnosis not present

## 2018-08-03 DIAGNOSIS — M6281 Muscle weakness (generalized): Secondary | ICD-10-CM | POA: Diagnosis not present

## 2018-08-04 ENCOUNTER — Other Ambulatory Visit: Payer: Self-pay | Admitting: Endocrinology

## 2018-08-05 DIAGNOSIS — R2681 Unsteadiness on feet: Secondary | ICD-10-CM | POA: Diagnosis not present

## 2018-08-05 DIAGNOSIS — M6281 Muscle weakness (generalized): Secondary | ICD-10-CM | POA: Diagnosis not present

## 2018-08-05 DIAGNOSIS — M542 Cervicalgia: Secondary | ICD-10-CM | POA: Diagnosis not present

## 2018-08-06 DIAGNOSIS — M6281 Muscle weakness (generalized): Secondary | ICD-10-CM | POA: Diagnosis not present

## 2018-08-06 DIAGNOSIS — M542 Cervicalgia: Secondary | ICD-10-CM | POA: Diagnosis not present

## 2018-08-06 DIAGNOSIS — R2681 Unsteadiness on feet: Secondary | ICD-10-CM | POA: Diagnosis not present

## 2018-08-09 ENCOUNTER — Telehealth: Payer: Self-pay

## 2018-08-09 ENCOUNTER — Ambulatory Visit: Payer: Medicare HMO | Admitting: Family Medicine

## 2018-08-09 ENCOUNTER — Telehealth: Payer: Self-pay | Admitting: Endocrinology

## 2018-08-09 DIAGNOSIS — M6281 Muscle weakness (generalized): Secondary | ICD-10-CM | POA: Diagnosis not present

## 2018-08-09 DIAGNOSIS — M542 Cervicalgia: Secondary | ICD-10-CM | POA: Diagnosis not present

## 2018-08-09 DIAGNOSIS — R2681 Unsteadiness on feet: Secondary | ICD-10-CM | POA: Diagnosis not present

## 2018-08-09 NOTE — Telephone Encounter (Signed)
Pt is in [place]ut hole they are charging $300 for her tresiba, what can we do to assist her?

## 2018-08-09 NOTE — Telephone Encounter (Signed)
Called patient states that she was seen by dermatologist last week and was told by them she needs to see neurology. She states that she has not been able to sleep in over two weeks because of her hand and foot pain. She is taking the gabapentin at night with no help. Would like referral for a doctor.

## 2018-08-09 NOTE — Telephone Encounter (Signed)
Copied from [place] [phone]. Topic: Quick Communication - See Telephone Encounter >> Aug 09, 2018  2:32 PM Hewitt Shorts wrote: Pt is requesting a referral to a neurologist -she would not go into much more details with me  Best number [phone]

## 2018-08-10 ENCOUNTER — Telehealth: Payer: Self-pay | Admitting: Family Medicine

## 2018-08-10 ENCOUNTER — Other Ambulatory Visit: Payer: Self-pay

## 2018-08-10 DIAGNOSIS — E1142 Type 2 diabetes mellitus with diabetic polyneuropathy: Secondary | ICD-10-CM

## 2018-08-10 NOTE — Telephone Encounter (Signed)
Copied from [place] [phone]. Topic: Quick Communication - See Telephone Encounter >> Aug 10, 2018  9:21 AM Bea Graff, NT wrote: CRM for notification. See Telephone encounter for: 08/10/18. Pt would like to see if something can be called in for numbness in her hands. She is aware that a referral to neurology is being worked on for her. Please advise.

## 2018-08-10 NOTE — Telephone Encounter (Signed)
Nothing to send in that we haven't trialed. Will need to wait for appointment.

## 2018-08-10 NOTE — Telephone Encounter (Signed)
Please advise on below  

## 2018-08-10 NOTE — Telephone Encounter (Signed)
Referral placed.

## 2018-08-10 NOTE — Telephone Encounter (Signed)
Called patient gave information she will let us know if she does not hear from someone in a few days.

## 2018-08-10 NOTE — Telephone Encounter (Signed)
LMTCB

## 2018-08-10 NOTE — Telephone Encounter (Signed)
She can apply for assistance with the company, [employer]

## 2018-08-10 NOTE — Telephone Encounter (Signed)
Okay. Make sure to put "recommended by Dermatology and requested by patient" in referral notes.

## 2018-08-10 NOTE — Telephone Encounter (Signed)
Please advise 

## 2018-08-11 ENCOUNTER — Telehealth: Payer: Self-pay | Admitting: Endocrinology

## 2018-08-11 DIAGNOSIS — M542 Cervicalgia: Secondary | ICD-10-CM | POA: Diagnosis not present

## 2018-08-11 DIAGNOSIS — R2681 Unsteadiness on feet: Secondary | ICD-10-CM | POA: Diagnosis not present

## 2018-08-11 DIAGNOSIS — M6281 Muscle weakness (generalized): Secondary | ICD-10-CM | POA: Diagnosis not present

## 2018-08-11 NOTE — Telephone Encounter (Signed)
Patient returned call. Please call patient at ph# [phone], if no answer please leave detailed message on voice Vanessa Gibson has to know what to do-Treciba too expensive

## 2018-08-11 NOTE — Telephone Encounter (Signed)
AK-Pt called as Unk Lightning is too expensive/she does not know what to do/plz advise/thx dmf

## 2018-08-11 NOTE — Telephone Encounter (Signed)
Had recommended yesterday that she apply for patient assistance from [employer].  If she is not able to get the Tresiba from them she can check on the price of Basaglar insulin

## 2018-08-12 ENCOUNTER — Telehealth: Payer: Self-pay | Admitting: Endocrinology

## 2018-08-12 NOTE — Telephone Encounter (Signed)
Mailed forms to patient to complete she verbalized understanding

## 2018-08-12 NOTE — Telephone Encounter (Signed)
Duplicate call.

## 2018-08-12 NOTE — Telephone Encounter (Signed)
Called pt to advise on Pt asst/I prepared paperwork and TG mailed to pt home to complete and send back/thx dmf

## 2018-08-12 NOTE — Telephone Encounter (Signed)
Please call patient at ph# [phone]. She is waiting for a call re: cannot afford [place].

## 2018-08-13 DIAGNOSIS — R2681 Unsteadiness on feet: Secondary | ICD-10-CM | POA: Diagnosis not present

## 2018-08-13 DIAGNOSIS — M542 Cervicalgia: Secondary | ICD-10-CM | POA: Diagnosis not present

## 2018-08-13 DIAGNOSIS — M6281 Muscle weakness (generalized): Secondary | ICD-10-CM | POA: Diagnosis not present

## 2018-08-13 NOTE — Telephone Encounter (Signed)
An xray won't help for diagnosis if symptoms are the numbness/tingling/burning. Okay appointment with Vanessa Gibson. Otherwise, need to wait for appointment.

## 2018-08-13 NOTE — Telephone Encounter (Signed)
Spoke with the patient and gave her Dr. Juleen China message. Patient was offered an appointment with Dr. Paulla Fore. Patient does not want to do that at this time. Patient states that she is seeing so many doctors now she doesn't want to see another at this time. Patient has neurology appointment with Dr. Algie Coffer in November. Patient stated that she is feeling a little better. She said that the PT may be helping.

## 2018-08-13 NOTE — Telephone Encounter (Signed)
Please advise 

## 2018-08-13 NOTE — Telephone Encounter (Signed)
Pt called and stated that she can not get in with her neurologists for 2 weeks and she would like an xray done next week. Pt states she is suffering. Please advise [phone]

## 2018-08-16 ENCOUNTER — Ambulatory Visit: Payer: Medicare HMO | Admitting: Family Medicine

## 2018-08-16 DIAGNOSIS — R2681 Unsteadiness on feet: Secondary | ICD-10-CM | POA: Diagnosis not present

## 2018-08-16 DIAGNOSIS — M6281 Muscle weakness (generalized): Secondary | ICD-10-CM | POA: Diagnosis not present

## 2018-08-16 DIAGNOSIS — M542 Cervicalgia: Secondary | ICD-10-CM | POA: Diagnosis not present

## 2018-08-17 ENCOUNTER — Telehealth: Payer: Self-pay | Admitting: Endocrinology

## 2018-08-17 NOTE — Telephone Encounter (Signed)
CMA spoke with the patient

## 2018-08-18 DIAGNOSIS — R2681 Unsteadiness on feet: Secondary | ICD-10-CM | POA: Diagnosis not present

## 2018-08-18 DIAGNOSIS — M542 Cervicalgia: Secondary | ICD-10-CM | POA: Diagnosis not present

## 2018-08-18 DIAGNOSIS — M6281 Muscle weakness (generalized): Secondary | ICD-10-CM | POA: Diagnosis not present

## 2018-08-20 DIAGNOSIS — R2681 Unsteadiness on feet: Secondary | ICD-10-CM | POA: Diagnosis not present

## 2018-08-20 DIAGNOSIS — M542 Cervicalgia: Secondary | ICD-10-CM | POA: Diagnosis not present

## 2018-08-20 DIAGNOSIS — M6281 Muscle weakness (generalized): Secondary | ICD-10-CM | POA: Diagnosis not present

## 2018-08-23 DIAGNOSIS — M542 Cervicalgia: Secondary | ICD-10-CM | POA: Diagnosis not present

## 2018-08-23 DIAGNOSIS — R2681 Unsteadiness on feet: Secondary | ICD-10-CM | POA: Diagnosis not present

## 2018-08-23 DIAGNOSIS — M6281 Muscle weakness (generalized): Secondary | ICD-10-CM | POA: Diagnosis not present

## 2018-08-24 ENCOUNTER — Other Ambulatory Visit: Payer: Self-pay | Admitting: Endocrinology

## 2018-08-25 ENCOUNTER — Ambulatory Visit: Payer: Self-pay | Admitting: *Deleted

## 2018-08-25 DIAGNOSIS — M542 Cervicalgia: Secondary | ICD-10-CM | POA: Diagnosis not present

## 2018-08-25 DIAGNOSIS — R2681 Unsteadiness on feet: Secondary | ICD-10-CM | POA: Diagnosis not present

## 2018-08-25 DIAGNOSIS — M6281 Muscle weakness (generalized): Secondary | ICD-10-CM | POA: Diagnosis not present

## 2018-08-25 NOTE — Telephone Encounter (Signed)
Pt called and message from Dr Juleen China 08/25/18 read to patient. Pt verbalized understanding of the instructions. Pt had questions about her Blood sugar medications and its effect on he BP. Pt was instructed to check her BS and take her blood sugar medication as prescribed. Appointment scheduled for tomorrow per Dr Juleen China instructions.  Pt urged to go to Urgent care or ED for worsening symptoms.

## 2018-08-25 NOTE — Telephone Encounter (Signed)
Pt called with having a low b/p pressure over the last couple days. She stated that she had been dizzy and when she went to therapy, her b/p was checked and found to be 90/60. She is on b/p medications. Feels like she is not drinking a lot of fluids. Her b/p has been 100/60 for several days and states not feeling well when it is that low.  She lives at [facility] and sometimes has some trouble with transportation. She is requesting an appointment for Monday. Per protocol she should be seen today. Is requesting a call back from her pcp regarding taking her b/p medications.  She is also c/o not sleeping well. Home care advice given to drink plenty of water, stay hydrated. If she starts feeling worst, more dizzy to the point of almost passing out, and increase in symptoms she needs to call 911.  Pt voiced understanding. She request a call back before 4:20 or after 5:30. If no answer, may leave a message for her and she will call back.  Reason for Disposition . [0] Systolic BP 15-615 AND [3] taking blood pressure medications AND [3] dizzy, lightheaded or weak  Answer Assessment - Initial Assessment Questions 1. BLOOD PRESSURE: "What is the blood pressure?" "Did you take at least two measurements 5 minutes apart?"     90/60 2. ONSET: "When did you take your blood pressure?"     This morning 3. HOW: "How did you obtain the blood pressure?" (e.g., visiting nurse, automatic home BP monitor)     Regular b/p monitor 4. HISTORY: "Do you have a history of low blood pressure?" "What is your blood pressure normally?"     No  5. MEDICATIONS: "Are you taking any medications for blood pressure?" If yes: "Have they been changed recently?"     yes 6. PULSE RATE: "Do you know what your pulse rate is?"      no 7. OTHER SYMPTOMS: "Have you been sick recently?" "Have you had a recent injury?"     Does not sleep well off and on. Has no strength.  Protocols used: LOW BLOOD PRESSURE-A-AH

## 2018-08-25 NOTE — Telephone Encounter (Signed)
Called left detailed message with instructions will call patient back in am.

## 2018-08-25 NOTE — Telephone Encounter (Signed)
Hold Lasix, potassium, and lisinopril. Needs to be see - either tonight at urgent care or tomorrow in office. Should not wait until Monday.

## 2018-08-26 ENCOUNTER — Ambulatory Visit (INDEPENDENT_AMBULATORY_CARE_PROVIDER_SITE_OTHER): Payer: Medicare HMO | Admitting: Family Medicine

## 2018-08-26 ENCOUNTER — Encounter: Payer: Self-pay | Admitting: Family Medicine

## 2018-08-26 VITALS — BP 116/56 | HR 70 | Temp 98.1°F | Ht 59.0 in | Wt 122.6 lb

## 2018-08-26 DIAGNOSIS — I951 Orthostatic hypotension: Secondary | ICD-10-CM

## 2018-08-26 DIAGNOSIS — R410 Disorientation, unspecified: Secondary | ICD-10-CM

## 2018-08-26 LAB — COMPREHENSIVE METABOLIC PANEL
ALBUMIN: 4 g/dL (ref 3.5–5.2)
ALT: 16 U/L (ref 0–35)
AST: 22 U/L (ref 0–37)
Alkaline Phosphatase: 45 U/L (ref 39–117)
BUN: 36 mg/dL — ABNORMAL HIGH (ref 6–23)
CHLORIDE: 101 meq/L (ref 96–112)
CO2: 30 mEq/L (ref 19–32)
Calcium: 9.6 mg/dL (ref 8.4–10.5)
Creatinine, Ser: 1.63 mg/dL — ABNORMAL HIGH (ref 0.40–1.20)
GFR: 31.66 mL/min — ABNORMAL LOW (ref >60.00)
Glucose, Bld: 182 mg/dL — ABNORMAL HIGH (ref 70–99)
POTASSIUM: 5.5 meq/L — AB (ref 3.5–5.1)
SODIUM: 134 meq/L — AB (ref 135–145)
Total Bilirubin: 0.3 mg/dL (ref 0.2–1.2)
Total Protein: 6.8 g/dL (ref 6.0–8.3)

## 2018-08-26 LAB — CBC WITH DIFFERENTIAL/PLATELET
BASOS PCT: 0.6 % (ref 0.0–3.0)
Basophils Absolute: 0 10*3/uL (ref 0.0–0.1)
EOS PCT: 3.2 % (ref 0.0–5.0)
Eosinophils Absolute: 0.2 10*3/uL (ref 0.0–0.7)
HCT: 33.3 % — ABNORMAL LOW (ref 36.0–46.0)
HEMOGLOBIN: 11.3 g/dL — AB (ref 12.0–15.0)
Lymphocytes Relative: 32 % (ref 12.0–46.0)
Lymphs Abs: 2.2 10*3/uL (ref 0.7–4.0)
MCHC: 33.9 g/dL (ref 30.0–36.0)
MCV: 94.6 fl (ref 78.0–100.0)
MONO ABS: 0.7 10*3/uL (ref 0.1–1.0)
Monocytes Relative: 10.2 % (ref 3.0–12.0)
NEUTROS ABS: 3.7 10*3/uL (ref 1.4–7.7)
Neutrophils Relative %: 54 % (ref 43.0–77.0)
PLATELETS: 241 10*3/uL (ref 150.0–400.0)
RBC: 3.53 Mil/uL — ABNORMAL LOW (ref 3.87–5.11)
RDW: 13 % (ref 11.5–15.5)
WBC: 6.9 10*3/uL (ref 4.0–10.5)

## 2018-08-26 LAB — POCT URINALYSIS DIPSTICK
Bilirubin, UA: NEGATIVE
Glucose, UA: NEGATIVE
Ketones, UA: NEGATIVE
Leukocytes, UA: NEGATIVE
Nitrite, UA: NEGATIVE
PH UA: 5.5 (ref 5.0–8.0)
PROTEIN UA: NEGATIVE
RBC UA: NEGATIVE
Spec Grav, UA: 1.02 (ref 1.010–1.025)
UROBILINOGEN UA: 0.2 U/dL

## 2018-08-26 NOTE — Progress Notes (Signed)
94/54PatientMikya Gibson MRN: [id number] DOB: [birth year] PCP: Briscoe Deutscher, DO     Subjective:  Chief Complaint  Patient presents with  . low blood pressure    dizziness x 1.5 yrs  . feels weak all over    HPI: The patient is a 84 y.o. female who presents today for dizziness and low blood pressure. She has been going to PT and noticed this past week at therapy that her BP was getting low. It was 90/60. She is light headed and dizzy. She is more dizzy with positional changes. She has to be careful how she walks. She called her pcp yesterday and they stopped her lasix, lisinopril, and potassium. Her blood sugar has been better, no hypoglycemic events over the last few weeks. Lowest reading has been 90s. No vision changes, palpitations, chest pain, shortness of breath or cough.   She has had no fever/chills, no respiratory symptoms, no diarrhea and no vomiting. No burning when she pees and no urinary symptoms. She states she is confused at times. She has not fallen.   Review of Systems  Constitutional: Positive for fatigue. Negative for fever.  HENT: Negative for congestion and trouble swallowing.   Eyes: Negative for visual disturbance.  Respiratory: Negative for cough and shortness of breath.   Cardiovascular: Negative for chest pain, palpitations and leg swelling.  Gastrointestinal: Negative for abdominal pain, nausea and vomiting.  Genitourinary: Negative for dysuria.  Skin: Negative for rash.  Neurological: Positive for dizziness, weakness, light-headedness and numbness.       Numbness in b/l hands  Psychiatric/Behavioral: Positive for confusion and sleep disturbance. Negative for agitation.    Allergies Patient has No Known Allergies.  Past Medical History Patient  has a past medical history of Anxiety, Arthritis, Asthma, Breast cancer ([place]), bilateral, Chronic pancreatitis ([place]), Depression, Dizziness, GERD (gastroesophageal reflux disease), History of hiatal hernia,  Hyperlipemia, Hypertension, IBS (irritable bowel syndrome), Mandibular fracture ([place]) (12/04/2015), MI (myocardial infarction) ([place]), and Skin cancer of face.  Surgical History Patient  has a past surgical history that includes Cholecystectomy open (1970's); Breast surgery; Mastectomy (Bilateral); and Cataract extraction w/ intraocular lens  implant, bilateral (Bilateral).  Family History [name]'s family history is not on file.  Social History Patient  reports that she has never smoked. She has never used smokeless tobacco. She reports that she does not drink alcohol or use drugs.    Objective: Vitals:   08/26/18 1343  BP: (!) 116/56  Pulse: 70  Temp: 98.1 F (36.7 C)  TempSrc: Oral  SpO2: 95%  Weight: 122 lb 9.6 oz (55.6 kg)  Height: 4\' 11"  (1.499 m)    Orthostatic VS for the past 24 hrs (Last 3 readings):  BP- Lying Pulse- Lying BP- Sitting Pulse- Sitting BP- Standing at 0 minutes Pulse- Standing at 0 minutes BP- Standing at 3 minutes Pulse- Standing at 3 minutes  08/26/18 1347 110/60 76 104/62 69 98/56 73 94/54 73    Body mass index is 24.76 kg/m.  Physical Exam  Constitutional: She is oriented to person, place, and time. She appears well-developed and well-nourished.  HENT:  Right Ear: External ear normal.  Left Ear: External ear normal.  Mouth/Throat: Oropharynx is clear and moist.  Neck: No thyromegaly present.  Cardiovascular: Normal rate, regular rhythm and normal heart sounds.  Pulmonary/Chest: Effort normal and breath sounds normal.  Abdominal: Soft. Bowel sounds are normal.  Neurological: She is alert and oriented to person, place, and time. No cranial nerve deficit.  Skin: Capillary refill  takes less than 2 seconds.  Psychiatric: She has a normal mood and affect. Her behavior is normal.  Vitals reviewed.      Assessment/plan: 1. Orthostatic hypotension Does not have 20mg  drop in systolic, but she is close and symptomatic. Lisinopril and lasix already  stopped. im going to half her lopressor in half and have her take 12.5mg  BID. She can not tell me if she has CAD so unsure if she is on this for cardio protection. May need to stop this completely. She does have the same reading as she did 3 months ago. discussed this with her. Also want her to make sure she is drinking plenty of water. Checking labs for any acute changes as well as urine for what she is saying is some confusion. Will have her come back for close f/u with PCP next week. Also has daily bp checks at the facility she is living in. She is to take her time getting up as well as she is a fall risk.  - CBC with Differential/Platelet - Comprehensive metabolic panel - POCT Urinalysis Dipstick - Urine Culture  2. Confusion Likely at her baseline, but will check urine/labs. Blood pressure same as it was 3 months ago.  - CBC with Differential/Platelet - Comprehensive metabolic panel - POCT Urinalysis Dipstick - Urine Culture    Return for keep scheduled appointment. Orma Flaming, MD [place]   08/26/2018

## 2018-08-26 NOTE — Patient Instructions (Addendum)
TAKE ONLY 1/2 PILL OF YOUR METOPROLOL twice a day  DO NOT take LISINOPRIL, LASIX or POTASSIUM.   Will see dr. Juleen China next week.

## 2018-08-26 NOTE — Telephone Encounter (Signed)
prented copy of message and gave to [facility] for today's  App

## 2018-08-27 ENCOUNTER — Ambulatory Visit: Payer: Self-pay | Admitting: *Deleted

## 2018-08-27 DIAGNOSIS — M6281 Muscle weakness (generalized): Secondary | ICD-10-CM | POA: Diagnosis not present

## 2018-08-27 DIAGNOSIS — M542 Cervicalgia: Secondary | ICD-10-CM | POA: Diagnosis not present

## 2018-08-27 DIAGNOSIS — R2681 Unsteadiness on feet: Secondary | ICD-10-CM | POA: Diagnosis not present

## 2018-08-27 LAB — URINE CULTURE
MICRO NUMBER: 91189927
SPECIMEN QUALITY:: ADEQUATE

## 2018-08-27 NOTE — Telephone Encounter (Signed)
Called patient l/m when she calls back let her now referral to neurology has been placed. Dr. Juleen China wants her to increase her water intake and try get some rest. If any change in symptoms go to ED. If able check bp and heart rate over the weekend and call office on Monday with readings.   CRM Started ok to give message.

## 2018-08-27 NOTE — Telephone Encounter (Signed)
See note

## 2018-08-27 NOTE — Telephone Encounter (Signed)
Neuro referral okay. Dx sleep disorder.

## 2018-08-27 NOTE — Telephone Encounter (Signed)
Please advise 

## 2018-08-27 NOTE — Telephone Encounter (Signed)
Pt called to report that her blood pressure was 104/48 at the physical therapist's office today; she also complains of being tired and weak; and also says that she stopped medication per Dr Juleen China; the pt also reports that she was changed to a mood stabilizer and taken off sleep medicine; she is also complaining of feeling tired, sleepy, weak, and she has only had broken sleep for the past 3 days; the pt wonders if all of her symptoms are related to her sleep medication or the issue that is to be evaluated by neurology; the pt was seen in MD's office 08/26/18; recommendations made per nurse triage protocol; the pt can be contacted [phone]; spoke with [facility] at [facility].    Reason for Disposition . [3] Fall in systolic BP > 20 mm Hg from normal AND [2] dizzy, lightheaded, or weak  Answer Assessment - Initial Assessment Questions 1. BLOOD PRESSURE: "What is the blood pressure?" "Did you take at least two measurements 5 minutes apart?"     104/48 at 1245 taken by physical therapist office 2. ONSET: "When did you take your blood pressure?"     08/27/18 at 1245 3. HOW: "How did you obtain the blood pressure?" (e.g., visiting nurse, automatic home BP monitor)     Taken in physical therapist's office 4. HISTORY: "Do you have a history of low blood pressure?" "What is your blood pressure normally?"     90/60 08/26/18 at MD's office 5. MEDICATIONS: "Are you taking any medications for blood pressure?" If yes: "Have they been changed recently?"     Stopped medication per Dr Juleen China 08/26/18 6. PULSE RATE: "Do you know what your pulse rate is?"     Pt can't check pulse 7. OTHER SYMPTOMS: "Have you been sick recently?" "Have you had a recent injury?"     Pt says she says that she hasn't slept in 3 nights, and Dr Juleen China changed the her sleep medication 8. PREGNANCY: "Is there any chance you are pregnant?" "When was your last menstrual period?"     no  Answer Assessment - Initial Assessment  Questions 1. REASON FOR CALL or QUESTION: "What is your reason for calling today?" or "How can I best help you?" or "What question do you have that I can help answer?"     Is medication change causing her to feel like this?  Protocols used: LOW BLOOD PRESSURE-A-AH, INFORMATION ONLY CALL-A-AH

## 2018-08-30 ENCOUNTER — Ambulatory Visit: Payer: Self-pay

## 2018-08-30 ENCOUNTER — Ambulatory Visit: Payer: Medicare HMO | Admitting: Endocrinology

## 2018-08-30 DIAGNOSIS — M6281 Muscle weakness (generalized): Secondary | ICD-10-CM | POA: Diagnosis not present

## 2018-08-30 DIAGNOSIS — R2681 Unsteadiness on feet: Secondary | ICD-10-CM | POA: Diagnosis not present

## 2018-08-30 DIAGNOSIS — M542 Cervicalgia: Secondary | ICD-10-CM | POA: Diagnosis not present

## 2018-08-30 NOTE — Telephone Encounter (Signed)
Call placed to patient VM to call office to cancel appointment if unable to find transportation.

## 2018-08-30 NOTE — Telephone Encounter (Signed)
Call received from patient who called to say she is weak from low BP. Pt states that Friday she was taken off several BP medications because her BP was too low. Today it is 108/56. She can not say what her HR is. BP was taken by thrapist. She states that she is to have surgery on her nose for a skin Cancer on Wednesday. She is not sure she wants to do it because it is painful. We discussed her weakness as a concern and I recommended that she see a physician today. She stated she would not have transportation today. Appointment scheduled for tomorrow with Dr Juleen China. Pt is working on transportation. I will call her in 1 hour to confirm.  Reason for Disposition . [8] Systolic BP 67-544 AND [9] taking blood pressure medications AND [3] dizzy, lightheaded or weak  Answer Assessment - Initial Assessment Questions 1. BLOOD PRESSURE: "What is the blood pressure?" "Did you take at least two measurements 5 minutes apart?"     108/56 2. ONSET: "When did you take your blood pressure?"    1330 this afternoon 3. HOW: "How did you obtain the blood pressure?" (e.g., visiting nurse, automatic home BP monitor)     therapy 4. HISTORY: "Do you have a history of low blood pressure?" "What is your blood pressure normally?"     130/70 5. MEDICATIONS: "Are you taking any medications for blood pressure?" If yes: "Have they been changed recently?"     Yes I was taken off meds because of low BP 6. PULSE RATE: "Do you know what your pulse rate is?"      No idea 7. OTHER SYMPTOMS: "Have you been sick recently?" "Have you had a recent injury?"     Skin Ca dx to nose  8. PREGNANCY: "Is there any chance you are pregnant?" "When was your last menstrual period?"     N/A  Protocols used: LOW BLOOD PRESSURE-A-AH

## 2018-08-31 ENCOUNTER — Ambulatory Visit (INDEPENDENT_AMBULATORY_CARE_PROVIDER_SITE_OTHER): Payer: Medicare HMO | Admitting: Family Medicine

## 2018-08-31 ENCOUNTER — Encounter: Payer: Self-pay | Admitting: Family Medicine

## 2018-08-31 VITALS — BP 120/58 | HR 68 | Temp 99.2°F | Ht 59.0 in | Wt 122.4 lb

## 2018-08-31 DIAGNOSIS — R42 Dizziness and giddiness: Secondary | ICD-10-CM | POA: Diagnosis not present

## 2018-08-31 DIAGNOSIS — M791 Myalgia, unspecified site: Secondary | ICD-10-CM

## 2018-08-31 DIAGNOSIS — E875 Hyperkalemia: Secondary | ICD-10-CM

## 2018-08-31 DIAGNOSIS — G4700 Insomnia, unspecified: Secondary | ICD-10-CM | POA: Diagnosis not present

## 2018-08-31 DIAGNOSIS — I700 Atherosclerosis of aorta: Secondary | ICD-10-CM

## 2018-08-31 DIAGNOSIS — E1142 Type 2 diabetes mellitus with diabetic polyneuropathy: Secondary | ICD-10-CM | POA: Diagnosis not present

## 2018-08-31 DIAGNOSIS — Z23 Encounter for immunization: Secondary | ICD-10-CM | POA: Diagnosis not present

## 2018-08-31 DIAGNOSIS — H6121 Impacted cerumen, right ear: Secondary | ICD-10-CM | POA: Diagnosis not present

## 2018-08-31 LAB — COMPREHENSIVE METABOLIC PANEL
ALT: 15 U/L (ref 0–35)
AST: 20 U/L (ref 0–37)
Albumin: 4.2 g/dL (ref 3.5–5.2)
Alkaline Phosphatase: 49 U/L (ref 39–117)
BUN: 33 mg/dL — ABNORMAL HIGH (ref 6–23)
CO2: 28 mEq/L (ref 19–32)
Calcium: 10.4 mg/dL (ref 8.4–10.5)
Chloride: 104 mEq/L (ref 96–112)
Creatinine, Ser: 1.5 mg/dL — ABNORMAL HIGH (ref 0.40–1.20)
GFR: 34.84 mL/min — ABNORMAL LOW (ref >60.00)
Glucose, Bld: 60 mg/dL — ABNORMAL LOW (ref 70–99)
Potassium: 5.1 mEq/L (ref 3.5–5.1)
Sodium: 139 mEq/L (ref 135–145)
Total Bilirubin: 0.5 mg/dL (ref 0.2–1.2)
Total Protein: 7.1 g/dL (ref 6.0–8.3)

## 2018-08-31 LAB — CK: Total CK: 64 U/L (ref 7–177)

## 2018-08-31 MED ORDER — MIRTAZAPINE 7.5 MG PO TABS: 7.5000 mg | ORAL_TABLET | Freq: Every day | ORAL | 3 refills | Status: DC

## 2018-08-31 MED ORDER — GABAPENTIN 100 MG PO CAPS: 100.0000 mg | ORAL_CAPSULE | Freq: Three times a day (TID) | ORAL | 3 refills | Status: DC

## 2018-08-31 NOTE — Progress Notes (Signed)
Vanessa Gibson is a 84 y.o. female is here for follow up.  History of Present Illness:   [name], CMA acting as scribe for Dr. Briscoe Deutscher.   HPI:  Patient called in office a few times last week for low blood pressures. She was seen by Dr. Rogers Blocker. She has stopped the medications as she was instructed. Labs have reviewed and informed that she needs to increase her water intake. She is still having a lot of weakness. She is not sleeping at all. She was only able to sleep about two hours this morning. She has noticed a little swelling in legs. She does feel like her heart is beating fast and has numbness in hands. She does have some wax in right ear that will be removed today. Her blood sugars have been reading good per patient. Lowest being around 90 and highest were around the 190's.    Sleep: Patient states she has not had more than two hours a night for sleep. She denies napping during the day. She would like to start something to help her sleep at night. She is taking Gabapentin but it does not help with sleep.   Neurology referral: She has an appointment for evaluation on hand pain and numbness in November.   Scalp: Was evaluated today no redness today. She has used cream that has been given by another doctor that has helped.   She is doing PT for neck and has noticed a bid improvement. She has had increased pain after starting PT in the back of head. Patient reassured that it is coming from neck pain.    There are no preventive care reminders to display for this patient. Depression screen [facility] 2/9 06/18/2018 08/12/2017 05/18/2017  Decreased Interest 3 2 2   Down, Depressed, Hopeless 2 2 2   PHQ - 2 Score 5 4 4   Altered sleeping 2 3 3   Tired, decreased energy 0 3 3  Change in appetite 0 1 2  Feeling bad or failure about yourself  3 2 2   Trouble concentrating 2 3 3   Moving slowly or fidgety/restless 2 - 1  Suicidal thoughts 0 0 0  PHQ-9 Score 14 16 18   Difficult doing work/chores -  Very difficult Very difficult   PMHx, SurgHx, SocialHx, FamHx, Medications, and Allergies were reviewed in the Visit Navigator and updated as appropriate.   Patient Active Problem List   Diagnosis Date Noted  . Bilateral lower extremity edema 06/18/2018  . Primary osteoarthritis of both knees 06/18/2018  . Arthralgia of both hands 06/18/2018  . Chronic cough 06/18/2018  . Hernia of abdominal wall 06/18/2018  . ILD (interstitial lung disease) ([place]) 04/05/2018  . Depression, recurrent ([place]), unhappy in [place] but feels stuck, declines medication or counseling 01/24/2018  . At risk for polypharmacy, due to vitamin overuse, working with patient to "deprescribe" 01/24/2018  . Constipation, uses Miralax prn 01/24/2018  . Diabetic peripheral neuropathy associated with type 2 diabetes mellitus ([place]), on low dose Neurontin 01/24/2018  . Aortic atherosclerosis ([place]), 2018 CXR 01/24/2018  . Lumbar spine scoliosis 01/24/2018  . Hyperlipidemia associated with type 2 diabetes mellitus ([place]), on statin 01/24/2018  . IBS (irritable bowel syndrome) 01/24/2018  . Uncontrolled type 2 diabetes mellitus with hyperglycemia, with long-term current use of insulin ([place]) 12/02/2017  . Insomnia 06/06/2017  . CKD stage 3 secondary to diabetes ([place]), on Lisinopril 05/26/2017  . Osteoporosis, post-menopausal 02/06/2016  . Bilateral hearing loss, refuses hearing aids or further testing 02/06/2016  .  Arthritis, takes Tumeric  02/06/2016  . Anemia of chronic disease 12/05/2015  . Protein-calorie malnutrition, weight stable 11/13/2015  . Chronic pancreatitis ([place]) 11/13/2015  . GERD (gastroesophageal reflux disease), on Nexium 11/13/2015  . Hypertension associated with diabetes ([place]), on Lisinopril 11/13/2015  . Allergic rhinitis, Rx Xyzal, flonase, atrovent and has been offered referral to Allergy 11/13/2015   Social History   Tobacco Use  . Smoking status: Never Smoker  . Smokeless tobacco: Never Used  Substance  Use Topics  . Alcohol use: No  . Drug use: No   Current Medications and Allergies:   .  acetaminophen (TYLENOL) 325 MG tablet, Take 2 tablets (650 mg total) by mouth every 6 (six) hours as needed for moderate pain or headache., Disp: , Rfl:  .  Ascorbic Acid (VITAMIN C) 1000 MG tablet, Take 1,000 mg by mouth daily., Disp: , Rfl:  .  aspirin EC 81 MG tablet, Take 81 mg by mouth daily., Disp: , Rfl:  .  BD PEN NEEDLE NANO U/F 32G X 4 MM MISC, USE TO INJECT INSULINS 4 TIMES DAILY., Disp: 200 each, Rfl: 0 .  fluticasone (FLONASE) 50 MCG/ACT nasal spray, Place 2 sprays into both nostrils daily., Disp: 16 g, Rfl: 0 .  gabapentin (NEURONTIN) 100 MG capsule, Take 1 capsule (100 mg total) by mouth at bedtime. For tingling hands, Disp: 90 capsule, Rfl: 3 .  insulin aspart (NOVOLOG FLEXPEN) 100 UNIT/ML FlexPen, Inject 3 units before breakfast and 4 units before supper (Patient taking differently: Inject 6 units before breakfast and 8 units before supper), Disp: 5 pen, Rfl: 4 .  insulin degludec (TRESIBA FLEXTOUCH) 100 UNIT/ML SOPN FlexTouch Pen, Inject 20 units into the skin once daily, Disp: 15 mL, Rfl: 1 .  isosorbide mononitrate (IMDUR) 30 MG 24 hr tablet, Take 1 tablet (30 mg total) by mouth daily. One tablet daily, Disp: 90 tablet, Rfl: 1 .  lovastatin (MEVACOR) 20 MG tablet, Take 1 tablet (20 mg total) by mouth at bedtime., Disp: 90 tablet, Rfl: 3 .  metoprolol tartrate (LOPRESSOR) 25 MG tablet, 1/2 tablet twice a day, Disp: 180 tablet, Rfl: 3 .  montelukast (SINGULAIR) 10 MG tablet, TAKE ONE TABLET AT BEDTIME., Disp: 30 tablet, Rfl: 6 .  Multiple Vitamins-Minerals (CENTRUM SILVER 50+WOMEN) TABS, Take 1 tablet by mouth daily., Disp: , Rfl:  .  Multiple Vitamins-Minerals (HAIR/SKIN/NAILS/BIOTIN PO), Take 1 tablet by mouth daily., Disp: , Rfl:  .  multivitamin-lutein (OCUVITE-LUTEIN) CAPS capsule, Take 1 capsule by mouth daily., Disp: , Rfl:  .  omeprazole (PRILOSEC) 40 MG capsule, Take 1 capsule (40  mg total) by mouth daily., Disp: 30 capsule, Rfl: 3 .  TRUEPLUS LANCETS 28G MISC, CHECK BLOOD SUGAR 3 TO 4 TIMES DAILY., Disp: 100 each, Rfl: 0  No Known Allergies   Review of Systems   Pertinent items are noted in the HPI. Otherwise, ROS is negative.  Vitals:   Vitals:   08/31/18 1033  BP: (!) 120/58  Pulse: 68  Temp: 99.2 F (37.3 C)  TempSrc: Oral  SpO2: 96%  Weight: 122 lb 6.4 oz (55.5 kg)  Height: 4' 11"  (1.499 m)     Body mass index is 24.72 kg/m.  Physical Exam:   Physical Exam  Constitutional: She is oriented to person, place, and time. She appears well-developed and well-nourished.  HENT:  Right Ear: External ear normal.  Left Ear: External ear normal.  Mouth/Throat: Oropharynx is clear and moist.  Neck: No thyromegaly present.  Cardiovascular: Normal rate,  regular rhythm and normal heart sounds.  Pulmonary/Chest: Effort normal and breath sounds normal.  Abdominal: Soft. Bowel sounds are normal.  Neurological: She is alert and oriented to person, place, and time. No cranial nerve deficit.  Skin: Capillary refill takes less than 2 seconds.  Psychiatric: She has a normal mood and affect. Her behavior is normal.  Vitals reviewed.  Assessment and Plan:   Etter was seen today for follow-up.  Diagnoses and all orders for this visit:  Insomnia -     mirtazapine (REMERON) 7.5 MG tablet; Take 1 tablet (7.5 mg total) by mouth at bedtime.  Diabetic peripheral neuropathy associated with type 2 diabetes mellitus ([place]), on low dose Neurontin Comments: Okay TID since she is tolerating the medication. Orders: -     gabapentin (NEURONTIN) 100 MG capsule; Take 1 capsule (100 mg total) by mouth 3 (three) times daily. For tingling hands  Hyperkalemia Comments: Recheck today. Orders: -     Comp Met (CMET)  Aortic atherosclerosis ([place]), 2018 CXR  Dizziness Comments: Orthostatics WNL. Will ask Cardiology to evaluate patient.  Orders: -     Ambulatory referral to  Cardiology  Myalgia -     CK  Impacted cerumen, right ear Comments: Right ear cerumen lavage in usual manner without complications.  Encounter for immunization -     Flu vaccine HIGH DOSE PF   Medications reviewed today. Patient unsure of current medications. Asked to bring bag to next visit. Updated medication list given to patient at end of visit.   . Reviewed expectations re: course of current medical issues. . Discussed self-management of symptoms. . Outlined signs and symptoms indicating need for more acute intervention. . Patient verbalized understanding and all questions were answered. Marland Kitchen Health Maintenance issues including appropriate healthy diet, exercise, and smoking avoidance were discussed with patient. . See orders for this visit as documented in the electronic medical record. . Patient received an After Visit Summary.  CMA served as Education administrator during this visit. History, Physical, and Plan performed by medical provider. The above documentation has been reviewed and is accurate and complete. Briscoe Deutscher, D.O.  Records requested if needed. Time spent with the patient: 45 minutes, of which >50% was spent in obtaining information about her symptoms, reviewing her previous labs, evaluations, and treatments, counseling her about her condition (please see the discussed topics above), and developing a plan to further investigate it; she had a number of questions which I addressed.   Briscoe Deutscher, DO [place], [facility] 09/05/2018

## 2018-08-31 NOTE — Patient Instructions (Addendum)
Try to drink 60 ounces of water a day. Try to do more of it in the morning so that you are not up going to the restroom during the night.   Arnica gel can be purchased over the counter for the knee pain.  You do not need a prescription called in for that you can just get it at the pharmacy.

## 2018-09-02 DIAGNOSIS — M6281 Muscle weakness (generalized): Secondary | ICD-10-CM | POA: Diagnosis not present

## 2018-09-02 DIAGNOSIS — M542 Cervicalgia: Secondary | ICD-10-CM | POA: Diagnosis not present

## 2018-09-02 DIAGNOSIS — R2681 Unsteadiness on feet: Secondary | ICD-10-CM | POA: Diagnosis not present

## 2018-09-03 ENCOUNTER — Telehealth: Payer: Self-pay | Admitting: Radiology

## 2018-09-03 ENCOUNTER — Ambulatory Visit: Payer: Medicare HMO | Admitting: Family Medicine

## 2018-09-03 DIAGNOSIS — M6281 Muscle weakness (generalized): Secondary | ICD-10-CM | POA: Diagnosis not present

## 2018-09-03 DIAGNOSIS — M542 Cervicalgia: Secondary | ICD-10-CM | POA: Diagnosis not present

## 2018-09-03 DIAGNOSIS — R2681 Unsteadiness on feet: Secondary | ICD-10-CM | POA: Diagnosis not present

## 2018-09-03 NOTE — Telephone Encounter (Signed)
Spoke to pt told her clarified medications with Dr. Juleen China and she is suppose to take Mirtazapine 7.5 mg at bedtime and Gabapentin 100 mg 3 times a day. Pt verbalized understanding and said she had stopped the Mirtazapine before. Told her yes, but now she wants you to take it. Pt verbalized understanding.

## 2018-09-03 NOTE — Telephone Encounter (Signed)
Copied from [place] [phone]. Topic: Quick Communication - See Telephone Encounter >> Sep 03, 2018  9:27 AM Antonieta Iba C wrote: CRM for notification. See Telephone encounter for: 09/03/18.  Pt called in to be advised. Pt seems confused about medications. (mirtazapine (REMERON) 7.5 MG tablet and also gabapentin (NEURONTIN) 100 MG capsule)   [phone]

## 2018-09-07 ENCOUNTER — Ambulatory Visit: Payer: Medicare HMO | Admitting: Family Medicine

## 2018-09-08 ENCOUNTER — Other Ambulatory Visit: Payer: Self-pay | Admitting: Endocrinology

## 2018-09-16 ENCOUNTER — Telehealth: Payer: Self-pay | Admitting: Endocrinology

## 2018-09-16 NOTE — Telephone Encounter (Signed)
Patient Called the [facility] requesting  a refill on her ACCU CHECK FAST CLICK LANCETS.     [place], [place]

## 2018-09-21 ENCOUNTER — Ambulatory Visit: Payer: Medicare HMO | Admitting: Family Medicine

## 2018-09-21 NOTE — Telephone Encounter (Signed)
Pt stated already have the lancet and will call back for the F/U appt.

## 2018-09-28 ENCOUNTER — Ambulatory Visit: Payer: Self-pay

## 2018-09-28 NOTE — Telephone Encounter (Signed)
Pt. called to report she is having elevated BP's, not sleeping, and feeling nervous.  Reported BP checked prior to calling office; BP 198/98 @ 3:45 PM, and 187/87 @ 3:50 PM.  Also, reported her feet and ankles are swollen; with left > right.  C/o heaviness in left chest, and feels very tired.  Stated she has shortness of breath, at times, with activity and at rest.  Also reported she feels she has gained weight around her stomach.  Stated she was taken off a fluid pill, sleeping pill, and BP medication, when her BP was low, at a previous appt.  Stated she feels that she needs to have this medication resumed.   Reviewed current medication list with pt.; noted Metoprolol was decreased to 12.5 mg BID, however, pt. Stated she got a new bottle, and forgot to cut her pills in half.  Reported has been taking Metoprolol 25 mg BID.  Requested pt. To recheck her BP at this time; reported BP 178/86, pulse 95.  Advised with her c/o heaviness in chest, increased BP, and swelling in abdomen and feet/ ankles, she should go to the ER.  Pt. Stated she does not want to go to the ER.  Reported her chest heaviness has been there for awhile.  Also stated the swelling in her stomach has gone down. Denied being short of breath now.  Stated "I just think Dr. Juleen China needs to give me the medicine back that she took away."  Advised it will require reevaluation of her symptoms, before medication can be added back on.   Attempted to call [facility] to discuss pt's complaints and refusal to go to ER.  Unable to reach [facility] at this time.  Advised the pt. My recommendation, again to go to the ER.  Stated she doesn't feel she needs to go to the ER.  Reported she rechecked her BP just now; BP 155/68, pulse 86.  Advised pt. Since BP is down, nurse could look for an appt. Tomorrow for reevaluation; stated she has no transportation.   Advised will send note to Dr. Juleen China.  Care Advice given per protocol, and advised that ultimately she should be seen at  the ER today.  Pt. Agreed if BP increases above 160/100 with chest heaviness, she would consider to go to the ER.  Advised to also expect further recommendation per Dr. Juleen China.  Verb. Understanding.        Reason for Disposition . [4] Systolic BP  >= 580 OR Diastolic >= 998 AND [3] cardiac or neurologic symptoms (e.g., chest pain, difficulty breathing, unsteady gait, blurred vision)  Answer Assessment - Initial Assessment Questions 1. BLOOD PRESSURE: "What is the blood pressure?" "Did you take at least two measurements 5 minutes apart?"     BP 198/98 3:45 PM   187/87 3:50 PM 2. ONSET: "When did you take your blood pressure?"    See above 3. HOW: "How did you obtain the blood pressure?" (e.g., visiting nurse, automatic home BP monitor)     Digital cuff  4. HISTORY: "Do you have a history of high blood pressure?"    yes 5. MEDICATIONS: "Are you taking any medications for blood pressure?" "Have you missed any doses recently?"     denied 6. OTHER SYMPTOMS: "Do you have any symptoms?" (e.g., headache, chest pain, blurred vision, difficulty breathing, weakness)     Swelling in feet up to ankle; left > right; shoes very tight; denied chest pain; "its kind of heavy at times"; feels that  she has gained weight around stomach; c/o shortness of breath with activity and at rest, c/o being very tired; not sleeping at night.; feels very nervous.   7. PREGNANCY: "Is there any chance you are pregnant?" "When was your last menstrual period?"     N/a  Protocols used: HIGH BLOOD PRESSURE-A-AH

## 2018-09-29 ENCOUNTER — Ambulatory Visit (INDEPENDENT_AMBULATORY_CARE_PROVIDER_SITE_OTHER): Payer: Medicare HMO | Admitting: Family Medicine

## 2018-09-29 ENCOUNTER — Encounter: Payer: Self-pay | Admitting: Family Medicine

## 2018-09-29 VITALS — BP 142/68 | HR 68 | Temp 97.9°F | Ht 59.0 in | Wt 125.4 lb

## 2018-09-29 DIAGNOSIS — R600 Localized edema: Secondary | ICD-10-CM | POA: Diagnosis not present

## 2018-09-29 DIAGNOSIS — I10 Essential (primary) hypertension: Secondary | ICD-10-CM

## 2018-09-29 DIAGNOSIS — R42 Dizziness and giddiness: Secondary | ICD-10-CM

## 2018-09-29 NOTE — Telephone Encounter (Signed)
Called patient she is going to call and see if she can get a ride and what time. Will call back. Informed if I have not heard from her in 1 hr I will call back.

## 2018-09-29 NOTE — Progress Notes (Signed)
Patient: Vanessa Gibson MRN: [id number] DOB: Aug 24, 1928 PCP: Briscoe Deutscher, DO     Subjective:  Chief Complaint  Patient presents with  . Dizziness  . Hypertension  . Foot Swelling    HPI: The patient is a 84 y.o. female who presents today for hypertension. She has been having elevated readings that started yesterday. Highest reading was 178/85. This AM her blood pressure was 141-151/84. She has been feeling bad and she may have been scared as she has never had such high blood pressure. All of her blood pressure pills and lasix were stopped in October due to hypotension. I saw her following this and decreased her lopressor to 1/2 pill BID. I have no idea if she is still taking this and she doesn't know what she Is taking. She was also referred to cardiology on 08/31/2018 and has not heard from them.   Dizzy spells: she has when she sits up in bed or gets out of bed. Has been a chronic issue. She does take her time getting out of bed. She was referred to cardiology for this and has not been called.   Vision changes: she has had vision changes the past few days only at night. She sees a flaming red circle on the wall and then it moves down and gets smaller and smaller. She states it is getting better and it's going the way. She is wondering if this was from her pressure.   Leg swelling: her feet are swelling. She used to be on lasix and wants to get back on this. She does not wear compression hose, she does not want to wear these.   Review of Systems  Constitutional: Positive for fatigue.  Respiratory: Negative for shortness of breath.   Cardiovascular: Negative for chest pain.  Gastrointestinal: Positive for abdominal pain. Negative for nausea.       Too much candy :)  Neurological: Positive for dizziness. Negative for headaches.    Allergies Patient has No Known Allergies.  Past Medical History Patient  has a past medical history of Anxiety, Arthritis, Asthma, Breast cancer ([place]),  bilateral, Chronic pancreatitis ([place]), Depression, Dizziness, GERD (gastroesophageal reflux disease), History of hiatal hernia, Hyperlipemia, Hypertension, IBS (irritable bowel syndrome), Mandibular fracture ([place]) (12/04/2015), MI (myocardial infarction) ([place]), and Skin cancer of face.  Surgical History Patient  has a past surgical history that includes Cholecystectomy open (1970's); Breast surgery; Mastectomy (Bilateral); and Cataract extraction w/ intraocular lens  implant, bilateral (Bilateral).  Family History [name]'s family history is not on file.  Social History Patient  reports that she has never smoked. She has never used smokeless tobacco. She reports that she does not drink alcohol or use drugs.    Objective: Vitals:   09/29/18 1349 09/29/18 1358  BP: 120/78 (!) 142/68  Pulse: 68   Temp: 97.9 F (36.6 C)   TempSrc: Oral   SpO2: 97%   Weight: 125 lb 6.4 oz (56.9 kg)   Height: 4\' 11"  (1.499 m)     Body mass index is 25.33 kg/m.   Orthostatic VS for the past 24 hrs:  BP- Lying Pulse- Lying BP- Sitting Pulse- Sitting BP- Standing at 0 minutes Pulse- Standing at 0 minutes  09/29/18 1358 140/78 73 120/78 68 122/72 74      Physical Exam  Constitutional: She is oriented to person, place, and time. She appears well-developed and well-nourished.  HENT:  Right Ear: External ear normal.  Left Ear: External ear normal.  Mouth/Throat: Oropharynx is clear and moist.  No oropharyngeal exudate.  Tm pearly with light reflex bilaterally    Neck: Normal range of motion. Neck supple. No thyromegaly present.  Cardiovascular: Normal rate, regular rhythm and normal heart sounds.  Pulmonary/Chest: Effort normal and breath sounds normal.  Abdominal: Soft. Bowel sounds are normal.  Neurological: She is alert and oriented to person, place, and time.  Vitals reviewed.      Assessment/plan: 1. Essential hypertension She is orthostatic and likely contributing to her dizziness. All BP  meds held except her lopressor. Advised that she bring in her cuff to make sure calibrated correctly on next visit. Also discussed as we age, not uncommon to see labile pressures. Again, her BP is to goal for her age. She is miserable with her foot edema and won't wear correct shoes or compression hose. BP fine enough that i'll add back her lasix every other day to help with edema, but again precautions given and she is to keep a log with close f/u.   2. Bilateral lower extremity edema Adding back lasix with potassium every other day. Will not do conservative therapy with compression hose and corrects shoes and discussed this is the best form of treatment. She is to watch blood pressure and dizziness with addition of the lasix. Close f/u.   3. Dizziness Chronic issue. Has had for years and is unchanged today. Orthostatic on vitals. Cardiology referral done by pcp on 08/31/18. Never heard from them. Phone number given to call and set up appointment. F/u with pcp in 1 week for recheck.    Return in about 1 week (around 10/06/2018) for bp check with dr. Juleen China .   Orma Flaming, MD [place]   09/29/2018

## 2018-09-29 NOTE — Telephone Encounter (Signed)
Patient needs to be seen since she has new symptoms. If still having any chest heaviness, SOB - needs to go to the ED. If not, can be seen here TODAY. No negotiation. No medications will be called in, especially based on two phone calls.

## 2018-09-29 NOTE — Telephone Encounter (Signed)
Called patient states she took bp this am at around 7:30 and it was 151/84 with p 83. She is having some sob but not more than normal for her denies any heaviness in chest.

## 2018-09-29 NOTE — Patient Instructions (Signed)
Blood pressure is fine. You are orthostatic so make sure drinking water especially with starting your fluid pill back.   -will start back your lasix/potassium every other day. Will have you see Dr. Juleen China in 1-2 weeks.   Keep a log of your blood pressure daily  Call cardiology and make appointment.

## 2018-09-29 NOTE — Telephone Encounter (Signed)
See note

## 2018-09-29 NOTE — Telephone Encounter (Signed)
Patient called back app with [facility] today

## 2018-10-01 ENCOUNTER — Encounter: Payer: Self-pay | Admitting: Neurology

## 2018-10-01 ENCOUNTER — Other Ambulatory Visit: Payer: Self-pay | Admitting: Endocrinology

## 2018-10-01 ENCOUNTER — Encounter

## 2018-10-01 ENCOUNTER — Ambulatory Visit: Payer: Medicare HMO | Admitting: Neurology

## 2018-10-01 VITALS — BP 118/64 | HR 71 | Ht 59.0 in | Wt 125.0 lb

## 2018-10-01 DIAGNOSIS — E1142 Type 2 diabetes mellitus with diabetic polyneuropathy: Secondary | ICD-10-CM | POA: Diagnosis not present

## 2018-10-01 DIAGNOSIS — M25472 Effusion, left ankle: Secondary | ICD-10-CM

## 2018-10-01 DIAGNOSIS — R200 Anesthesia of skin: Secondary | ICD-10-CM | POA: Insufficient documentation

## 2018-10-01 DIAGNOSIS — G4700 Insomnia, unspecified: Secondary | ICD-10-CM

## 2018-10-01 DIAGNOSIS — M542 Cervicalgia: Secondary | ICD-10-CM | POA: Diagnosis not present

## 2018-10-01 DIAGNOSIS — M25471 Effusion, right ankle: Secondary | ICD-10-CM | POA: Diagnosis not present

## 2018-10-01 MED ORDER — GABAPENTIN 100 MG PO CAPS: ORAL_CAPSULE | ORAL | 5 refills | Status: DC

## 2018-10-01 NOTE — Patient Instructions (Signed)
Increase gabapentin to 200 mg (two pills) in the morning and 300 mg (three pills) at night  I will see you back for the Nerve study

## 2018-10-01 NOTE — Progress Notes (Signed)
[facility]  PATIENT: Vanessa Gibson DOB: 1928/11/06  REFERRING DOCTOR OR PCP:  Briscoe Deutscher SOURCE: Patient, notes from primary care, imaging and lab reports, CT scan images personally reviewed on PACS  _________________________________   HISTORICAL  CHIEF COMPLAINT:  Chief Complaint  Patient presents with  . New Patient (Initial Visit)    RM 13, alone. Internal referral from Dr. Juleen China for diabetic peripheral neuropathy.     HISTORY OF PRESENT ILLNESS:  I had the pleasure seeing a patient, Vanessa Gibson, and [facility] for neurologic consultation regarding her peripheral neuropathy,  numbness, neck pain and other symptoms.  She has had numbness in her hands > feet.   She feels a sand like grit sensation in her hands.   Lotion does not help the sensation.    She notes some weakness in her hands.    She has pain in the same distribution as the numbness.      In her legs, she also has numbness and pain.   She gets jolts of sharp pain.   Her right leg feels heavy at times.    She notes neck pain, left > right, and lower back pain. Neck pain is worse with movements.  No radicular pain into her arms.   She has done PT in the back for neck pain but notes it did not help.    She feels her gait has slowed down and she gets winded if she goes longer distances.    She also has a lot of general complaints --- can't focus, feels weak, can't sleep.    She also reports headaches almost every day.     She has insulin-dependent type 2 diabetes mellitus.   She has had DM x 20 years.   Control is moderate with last few HgbA1c 6.8 - 7.3.   She has peripheral edema in her ankles and feet.   She reports restarting Lasix recently with some benefit in the swelling.    She has had urinary incontinence x years.   She wears pads.    She reports that 2 years ago, sand like material fell from her ceiling all over her and she reports her symptoms started after that.        She lives in assisted living.   She gets her meals and housekeeping included.     She had labs 06/18/2018.  Vitamin B12 was normal.  (553 pg/ml).   TSH was normal.  Potassium was elevated at 5.4.  Over the last year, hemoglobin A1c was between 6.8 and 7.5 in the last 3 fasting glucoses over the last 3 months were 182, 148 and 134.  I personally reviewed a head CT from 06/24/2017 and compared to one 11/12/2015.   She has stable advanced chronic microvascular ischemic changes.  There is mild generalized cortical atrophy.    The CT scan of the cervical spine shows multilevel degenerative changes worse at C6C7 (minimal anterolishesis) and C5C6 (moderate right foraminal narowing).  There does not appear to be significant spinal stenosis.  REVIEW OF SYSTEMS: Constitutional: No fevers, chills, sweats, or change in appetite.  Notes insomnia Eyes: No visual changes, double vision, eye pain Ear, nose and throat: No hearing loss, ear pain, nasal congestion, sore throat Cardiovascular: No chest pain, palpitations Respiratory: No shortness of breath at rest or with exertion.   No wheezes GastrointestinaI: No nausea, vomiting, diarrhea, abdominal pain, fecal incontinence Genitourinary: No dysuria, urinary retention or frequency.  No nocturia. Musculoskeletal:Please see  above  integumentary: No rash, pruritus, skin lesions Neurological: as above Psychiatric: No depression at this time.  No anxiety Endocrine: She has insulin-dependent diabetes mellitus.  Hematologic/Lymphatic: No anemia, purpura, petechiae. Allergic/Immunologic: No itchy/runny eyes, nasal congestion, recent allergic reactions, rashes  ALLERGIES: No Known Allergies  HOME MEDICATIONS:  Current Outpatient Medications:  .  ACCU-CHEK GUIDE test strip, CHECK BLOOD SUGAR THREE TO FOUR TIMES DAILY, Disp: 400 each, Rfl: 2 .  acetaminophen (TYLENOL) 325 MG tablet, Take 2 tablets (650 mg total) by mouth every 6 (six) hours as needed for moderate  pain or headache., Disp: , Rfl:  .  Alcohol Swabs (B-D SINGLE USE SWABS REGULAR) PADS, USE TO CLEAN FINGER BEFORE CHECKING BLOOD SUGARS AND CLEAN SKIN BEFORE INSULIN INJECTIONS., Disp: 300 each, Rfl: 2 .  Ascorbic Acid (VITAMIN C) 1000 MG tablet, Take 1,000 mg by mouth daily., Disp: , Rfl:  .  aspirin EC 81 MG tablet, Take 81 mg by mouth daily., Disp: , Rfl:  .  BD PEN NEEDLE NANO U/F 32G X 4 MM MISC, USE TO INJECT INSULINS 4 TIMES DAILY., Disp: 200 each, Rfl: 0 .  fluticasone (FLONASE) 50 MCG/ACT nasal spray, Place 2 sprays into both nostrils daily., Disp: 16 g, Rfl: 0 .  Furosemide (LASIX PO), Take by mouth., Disp: , Rfl:  .  gabapentin (NEURONTIN) 100 MG capsule, Take two pills po qAM and three pills po qHS, Disp: 150 capsule, Rfl: 5 .  insulin aspart (NOVOLOG FLEXPEN) 100 UNIT/ML FlexPen, Inject 3 units before breakfast and 4 units before supper (Patient taking differently: Inject 6 units before breakfast and 8 units before supper), Disp: 5 pen, Rfl: 4 .  insulin degludec (TRESIBA FLEXTOUCH) 100 UNIT/ML SOPN FlexTouch Pen, Inject 20 units into the skin once daily, Disp: 15 mL, Rfl: 1 .  lovastatin (MEVACOR) 20 MG tablet, Take 1 tablet (20 mg total) by mouth at bedtime., Disp: 90 tablet, Rfl: 3 .  metoprolol tartrate (LOPRESSOR) 25 MG tablet, TAKE 1 TABLET TWICE DAILY, Disp: 180 tablet, Rfl: 2 .  montelukast (SINGULAIR) 10 MG tablet, TAKE ONE TABLET AT BEDTIME., Disp: 30 tablet, Rfl: 6 .  Multiple Vitamins-Minerals (CENTRUM SILVER 50+WOMEN) TABS, Take 1 tablet by mouth daily., Disp: , Rfl:  .  Multiple Vitamins-Minerals (HAIR/SKIN/NAILS/BIOTIN PO), Take 1 tablet by mouth daily., Disp: , Rfl:  .  multivitamin-lutein (OCUVITE-LUTEIN) CAPS capsule, Take 1 capsule by mouth daily., Disp: , Rfl:  .  omeprazole (PRILOSEC) 40 MG capsule, Take 1 capsule (40 mg total) by mouth daily., Disp: 30 capsule, Rfl: 3 .  POTASSIUM PO, Take by mouth., Disp: , Rfl:  .  TRUEPLUS LANCETS 28G MISC, CHECK BLOOD SUGAR 3  TO 4 TIMES DAILY., Disp: 100 each, Rfl: 0  PAST MEDICAL HISTORY: Past Medical History:  Diagnosis Date  . Anxiety   . Arthritis   . Asthma   . Breast cancer ([place]), bilateral   . Chronic pancreatitis ([place])   . Depression   . Dizziness   . GERD (gastroesophageal reflux disease)   . History of hiatal hernia   . Hyperlipemia   . Hypertension   . IBS (irritable bowel syndrome)   . Mandibular fracture ([place]) 12/04/2015  . MI (myocardial infarction) ([place])   . Skin cancer of face     PAST SURGICAL HISTORY: Past Surgical History:  Procedure Laterality Date  . BREAST SURGERY    . CATARACT EXTRACTION W/ INTRAOCULAR LENS  IMPLANT, BILATERAL Bilateral   . CHOLECYSTECTOMY OPEN  1970's  . MASTECTOMY  Bilateral     FAMILY HISTORY: History reviewed. No pertinent family history.  SOCIAL HISTORY:  Social History   Socioeconomic History  . Marital status: Divorced    Spouse name: Not on file  . Number of children: Not on file  . Years of education: Not on file  . Highest education level: Not on file  Occupational History  . Not on file  Social Needs  . Financial resource strain: Not on file  . Food insecurity:    Worry: Not on file    Inability: Not on file  . Transportation needs:    Medical: Not on file    Non-medical: Not on file  Tobacco Use  . Smoking status: Never Smoker  . Smokeless tobacco: Never Used  Substance and Sexual Activity  . Alcohol use: No  . Drug use: No  . Sexual activity: Never  Lifestyle  . Physical activity:    Days per week: Not on file    Minutes per session: Not on file  . Stress: Not on file  Relationships  . Social connections:    Talks on phone: Not on file    Gets together: Not on file    Attends religious service: Not on file    Active member of club or organization: Not on file    Attends meetings of clubs or organizations: Not on file    Relationship status: Not on file  . Intimate partner violence:    Fear of current or ex  partner: Not on file    Emotionally abused: Not on file    Physically abused: Not on file    Forced sexual activity: Not on file  Other Topics Concern  . Not on file  Social History Narrative   Lives at [facility], [place].    Caffeine use:      PHYSICAL EXAM  Vitals:   10/01/18 1017 10/01/18 1024  BP: 137/70 118/64  Pulse: 76 71  Weight: 125 lb (56.7 kg)   Height: 4\' 11"  (1.499 m)     Body mass index is 25.25 kg/m.   General: The patient is well-developed and well-nourished and in no acute distress   Neck: The neck is supple, no carotid bruits are noted.  Range of motion is mildly reduced in the neck.  She has tenderness in the neck cervical paraspinal muscles left greater than right..  Cardiovascular: The heart has a regular rate and rhythm with a normal S1 and S2. There were no murmurs, gallops or rubs. Lungs are clear to auscultation.  Skin: Extremities are without significant edema.  Musculoskeletal:  Enlarged CMC joints in both hands.   Back is tender over lower lumbar paraspinals.   No tenderness over trochanteric bursae and just mild over piriformis muscles.    Neurologic Exam  Mental status: The patient is alert and oriented x 3 at the time of the examination. The patient has apparent normal recent and remote memory, with an apparently normal attention span and concentration ability.   Speech is normal.  Cranial nerves: Extraocular movements are full. Pupils are equal, round, and reactive to light and accomodation  Facial symmetry is present. There is good facial sensation to soft touch bilaterally.Facial strength is normal.  Trapezius and sternocleidomastoid strength is normal. No dysarthria is noted.  The tongue is midline, and the patient has symmetric elevation of the soft palate. No obvious hearing deficits are noted.  Motor:  Muscle bulk is normal.   Tone is normal. Strength is  5 / 5 in all 4 extremities.   Sensory: She reported reduced  sensation to vibration in the toes and ankles  (25% compared to the knees).    There is also mild decreased touch sensation below the ankles  Coordination: Cerebellar testing reveals good finger-nose-finger and heel-to-shin bilaterally.  Gait and station: Station is normal.   Her gait is mildly arthritic.  The tandem gait is poor.  Romberg is negative.   Reflexes: Deep tendon reflexes are symmetric and normal in the arms, 1+ at the knees and absent at the ankles..   Plantar responses are flexor.    DIAGNOSTIC DATA (LABS, IMAGING, TESTING) - I reviewed patient records, labs, notes, testing and imaging myself where available.  Lab Results  Component Value Date   WBC 6.9 08/26/2018   HGB 11.3 (L) 08/26/2018   HCT 33.3 (L) 08/26/2018   MCV 94.6 08/26/2018   PLT 241.0 08/26/2018      Component Value Date/Time   NA 139 08/31/2018 1114   K 5.1 08/31/2018 1114   CL 104 08/31/2018 1114   CO2 28 08/31/2018 1114   GLUCOSE 60 (L) 08/31/2018 1114   BUN 33 (H) 08/31/2018 1114   CREATININE 1.50 (H) 08/31/2018 1114   CALCIUM 10.4 08/31/2018 1114   PROT 7.1 08/31/2018 1114   ALBUMIN 4.2 08/31/2018 1114   AST 20 08/31/2018 1114   ALT 15 08/31/2018 1114   ALKPHOS 49 08/31/2018 1114   BILITOT 0.5 08/31/2018 1114   GFRNONAA 34 (L) 04/20/2018 0105   GFRAA 39 (L) 04/20/2018 0105   Lab Results  Component Value Date   CHOL 146 03/17/2018   HDL 52.30 03/17/2018   LDLCALC 71 03/17/2018   TRIG 114.0 03/17/2018   CHOLHDL 3 03/17/2018   Lab Results  Component Value Date   HGBA1C 6.8 (A) 05/26/2018   Lab Results  Component Value Date   VITAMINB12 553 06/18/2018   Lab Results  Component Value Date   TSH 1.80 06/18/2018       ASSESSMENT AND PLAN  Diabetic peripheral neuropathy associated with type 2 diabetes mellitus ([place]), on low dose Neurontin - Okay TID since she is tolerating the medication. - Plan: gabapentin (NEURONTIN) 100 MG capsule, NCV with EMG(electromyography), CANCELED:  NCV with EMG(electromyography)  Bilateral hand numbness - Plan: NCV with EMG(electromyography)  Neck pain - Plan: NCV with EMG(electromyography)  Insomnia, unspecified type  Ankle edema, bilateral - Plan: Compression stockings   In summary, Vanessa Gibson is an 84 year old woman with multiple neurologic complaints including numbness, neck pain, joint pain and back pain.    The numbness is bothering her more in the hands the legs.  Therefore, she most likely superimposed median or ulnar neuropathies on top of a diabetic polyneuropathy.   To help sort this out we will check an NCV/EMG of the arms and one leg.  She showed me a sample of material that fell on her from her ceiling a couple years ago and she feels that her symptoms may be related.  I do not think that that is likely but she could always take the samples to an environmental testing company.   If the NCV shows a demyelinating polyneuropathy, we will check labs for MGUS.  If she also has evidence of significant radiculopathy on the EMG, consider an MRI of the cervical spine.  She did not appear to have spinal stenosis on the CT scan performed in 2016.    Her mild gait and balance issues are likely multifactorial.  She has arthritis, neuropathy and chronic microvascular ischemic changes in the brain, likely all contributing.     To help with her pain, I will have her increase the gabapentin from 100 mg 3 times a day to 200 mg in the morning 300 mg at night.  As she has some renal failure I would not recommend increasing the dose much more.  I also gave her a prescription for TED hose for her feet and ankles at her request.  Thank you for asking me to see Mrs. Brizzi.  Please let me know if I can be of further assistance with her or other patients in the future.  Vanessa Gibson A. Felecia Shelling, MD, [facility] 40/02/5912, 68:59 AM Certified in Neurology, Clinical Neurophysiology, Sleep Medicine, Pain Medicine and Neuroimaging  [facility] [address] [place], [place] [address] [phone]

## 2018-10-05 ENCOUNTER — Other Ambulatory Visit: Payer: Self-pay | Admitting: Endocrinology

## 2018-10-14 ENCOUNTER — Telehealth: Payer: Self-pay | Admitting: Neurology

## 2018-10-14 ENCOUNTER — Other Ambulatory Visit: Payer: Self-pay | Admitting: Endocrinology

## 2018-10-14 NOTE — Telephone Encounter (Signed)
Pt is asking for a call to discuss her dosage of gabapentin (NEURONTIN) 100 MG capsule, pt states that she has gone from 2 a day to now being prescribed 5 a day.  Pt feels this is causing her sugar to go up and she feels jittery and this is also affecting her sleep pattern.  Please call

## 2018-10-14 NOTE — Telephone Encounter (Signed)
Called and spoke with pt. Advised I talked to Dr. Felecia Shelling. Instead of taking gabapentin 100mg  (2 in the am, 3 in the pm). He wants her to try decrease this and take 2 in the am and 2 in the pm. She will try this to see if it will improve her sx. She will call back if it does not improve.

## 2018-10-19 ENCOUNTER — Telehealth: Payer: Self-pay

## 2018-10-19 NOTE — Telephone Encounter (Signed)
Copied from [place] [id number]. Topic: Quick Communication - Rx Refill/Question >> Oct 19, 2018 12:42 PM Scherrie Gerlach wrote: Medication: a pain med  Pt states she has pain in her neck and dr Juleen China is aware of this issue. Pt states tylenol is not working and she hopes Dr Juleen China can prescribe something stronger. Pt declined to make an appt, states she does not have the time. [place], [place] [phone] (Phone) [phone] (Fax)

## 2018-10-19 NOTE — Telephone Encounter (Signed)
Called patient let her know that Dr. Juleen China is out of the office. Instructed her to call neurology to see what they recommend. She was seen in their office and evaluated for neck and arm pain. Looks like she has some appointments coming up. She wanted to see if Dr. Rogers Blocker would see her. Instructed that it would be better for her if she would call neurology she finally agreed to that and will give them a call.

## 2018-10-29 ENCOUNTER — Telehealth: Payer: Self-pay | Admitting: Family Medicine

## 2018-10-29 NOTE — Telephone Encounter (Signed)
Please advise how soon pt can come in.   Copied from [place] [id number]. Topic: Appointment Scheduling - Scheduling Inquiry for Clinic >> Oct 29, 2018 12:57 PM Lennox Solders wrote: Reason for CRM: pt is calling to see dr wallace soon pt is having trouble with her bladder she is wetting her pants. Pt unable to sleep. Please advise

## 2018-11-01 ENCOUNTER — Telehealth: Payer: Self-pay

## 2018-11-01 NOTE — Telephone Encounter (Signed)
Left message to return call to our office.  

## 2018-11-01 NOTE — Telephone Encounter (Signed)
Called pt to get information on family hx. Left voice mail asking pt to call the office.

## 2018-11-09 ENCOUNTER — Ambulatory Visit: Payer: Medicare HMO | Admitting: Cardiology

## 2018-11-10 DIAGNOSIS — C44311 Basal cell carcinoma of skin of nose: Secondary | ICD-10-CM | POA: Diagnosis not present

## 2018-11-10 DIAGNOSIS — Z85828 Personal history of other malignant neoplasm of skin: Secondary | ICD-10-CM | POA: Diagnosis not present

## 2018-11-12 ENCOUNTER — Encounter (HOSPITAL_COMMUNITY): Payer: Self-pay | Admitting: Emergency Medicine

## 2018-11-12 ENCOUNTER — Other Ambulatory Visit: Payer: Self-pay

## 2018-11-12 ENCOUNTER — Telehealth: Payer: Self-pay | Admitting: Endocrinology

## 2018-11-12 ENCOUNTER — Emergency Department (HOSPITAL_COMMUNITY)
Admission: EM | Admit: 2018-11-12 | Discharge: 2018-11-12 | Disposition: A | Discharge status: Home / Self Care | Payer: Medicare HMO | Attending: Emergency Medicine | Admitting: Emergency Medicine

## 2018-11-12 DIAGNOSIS — Z853 Personal history of malignant neoplasm of breast: Secondary | ICD-10-CM | POA: Diagnosis not present

## 2018-11-12 DIAGNOSIS — Z794 Long term (current) use of insulin: Secondary | ICD-10-CM | POA: Diagnosis not present

## 2018-11-12 DIAGNOSIS — Z85828 Personal history of other malignant neoplasm of skin: Secondary | ICD-10-CM | POA: Diagnosis not present

## 2018-11-12 DIAGNOSIS — I252 Old myocardial infarction: Secondary | ICD-10-CM | POA: Diagnosis not present

## 2018-11-12 DIAGNOSIS — E119 Type 2 diabetes mellitus without complications: Secondary | ICD-10-CM | POA: Insufficient documentation

## 2018-11-12 DIAGNOSIS — Z711 Person with feared health complaint in whom no diagnosis is made: Secondary | ICD-10-CM | POA: Diagnosis not present

## 2018-11-12 DIAGNOSIS — R030 Elevated blood-pressure reading, without diagnosis of hypertension: Secondary | ICD-10-CM | POA: Diagnosis present

## 2018-11-12 DIAGNOSIS — I10 Essential (primary) hypertension: Secondary | ICD-10-CM | POA: Diagnosis not present

## 2018-11-12 LAB — CBG MONITORING, ED: Glucose-Capillary: 235 mg/dL — ABNORMAL HIGH (ref 70–99)

## 2018-11-12 NOTE — ED Notes (Signed)
ED Provider at bedside. 

## 2018-11-12 NOTE — Telephone Encounter (Signed)
error 

## 2018-11-12 NOTE — ED Notes (Signed)
Patient verbalizes understanding of discharge instructions. Opportunity for questioning and answers were provided. Armband removed by staff, pt discharged from ED.  

## 2018-11-12 NOTE — ED Triage Notes (Addendum)
Patient reports that she is here because her SBP was about 198  Last night and this about 187.  Patient is hard of hearing.

## 2018-11-12 NOTE — ED Provider Notes (Signed)
[facility] EMERGENCY DEPARTMENT Provider Note   CSN: [id number] Arrival date & time: 11/12/18  2671     History   Chief Complaint No chief complaint on file.   HPI Vanessa Gibson is a 84 y.o. female.  HPI 84 year old female who reports elevated blood pressure last night up to 245 systolic.  She contacted her primary care doctor's office who recommended that she come to the ER for evaluation.  She states that her blood pressures been waxing and waning over the past several weeks.  No recent change in her blood pressure medications.  She does not remember the name of her blood pressure medications.  She has no new focal or acute complaints.  She does admit to ongoing generalized weakness which is been here for months as well as abnormal sensation to her bilateral hands that feels like numbness which is been present for months as well.  She reports a hernia that is been present for months in her central abdomen without nausea vomiting or pain.  She is concerned about her elevated blood pressure readings and does not know what this means.  She states that she stayed up all last night checking her blood pressure multiple times and it remained elevated.  No chest pain or shortness of breath Past Medical History:  Diagnosis Date  . Anxiety   . Arthritis   . Asthma   . Breast cancer ([place]), bilateral   . Chronic pancreatitis ([place])   . Depression   . Dizziness   . GERD (gastroesophageal reflux disease)   . History of hiatal hernia   . Hyperlipemia   . Hypertension   . IBS (irritable bowel syndrome)   . Mandibular fracture ([place]) 12/04/2015  . MI (myocardial infarction) ([place])   . Skin cancer of face     Patient Active Problem List   Diagnosis Date Noted  . Bilateral hand numbness 10/01/2018  . Neck pain 10/01/2018  . Bilateral lower extremity edema 06/18/2018  . Primary osteoarthritis of both knees 06/18/2018  . Arthralgia of both hands 06/18/2018  . Chronic cough  06/18/2018  . Hernia of abdominal wall 06/18/2018  . ILD (interstitial lung disease) ([place]) 04/05/2018  . Depression, recurrent ([place]), unhappy in [place] but feels stuck, declines medication or counseling 01/24/2018  . At risk for polypharmacy, due to vitamin overuse, working with patient to "deprescribe" 01/24/2018  . Constipation, uses Miralax prn 01/24/2018  . Diabetic peripheral neuropathy associated with type 2 diabetes mellitus ([place]), on low dose Neurontin 01/24/2018  . Aortic atherosclerosis ([place]), 2018 CXR 01/24/2018  . Lumbar spine scoliosis 01/24/2018  . Hyperlipidemia associated with type 2 diabetes mellitus ([place]), on statin 01/24/2018  . IBS (irritable bowel syndrome) 01/24/2018  . Uncontrolled type 2 diabetes mellitus with hyperglycemia, with long-term current use of insulin ([place]) 12/02/2017  . Insomnia 06/06/2017  . CKD stage 3 secondary to diabetes ([place]), on Lisinopril 05/26/2017  . Osteoporosis, post-menopausal 02/06/2016  . Bilateral hearing loss, refuses hearing aids or further testing 02/06/2016  . Arthritis, takes Tumeric  02/06/2016  . Anemia of chronic disease 12/05/2015  . Protein-calorie malnutrition, weight stable 11/13/2015  . Chronic pancreatitis ([place]) 11/13/2015  . GERD (gastroesophageal reflux disease), on Nexium 11/13/2015  . Hypertension associated with diabetes ([place]), on Lisinopril 11/13/2015  . Allergic rhinitis, Rx Xyzal, flonase, atrovent and has been offered referral to Allergy 11/13/2015    Past Surgical History:  Procedure Laterality Date  . BREAST SURGERY    . CATARACT EXTRACTION W/ INTRAOCULAR  LENS  IMPLANT, BILATERAL Bilateral   . CHOLECYSTECTOMY OPEN  1970's  . MASTECTOMY Bilateral      OB History   No obstetric history on file.      Home Medications    Prior to Admission medications   Medication Sig Start Date End Date Taking? Authorizing Provider  ACCU-CHEK GUIDE test strip CHECK BLOOD SUGAR THREE TO FOUR TIMES DAILY 09/08/18   Elayne Snare, MD  acetaminophen (TYLENOL) 325 MG tablet Take 2 tablets (650 mg total) by mouth every 6 (six) hours as needed for moderate pain or headache. 11/14/15   Iline Oven, MD  Alcohol Swabs (B-D SINGLE USE SWABS REGULAR) PADS USE TO CLEAN FINGER BEFORE CHECKING BLOOD SUGARS AND CLEAN SKIN BEFORE INSULIN INJECTIONS. 09/08/18   Elayne Snare, MD  Ascorbic Acid (VITAMIN C) 1000 MG tablet Take 1,000 mg by mouth daily.    [provider]  aspirin EC 81 MG tablet Take 81 mg by mouth daily.    [provider]  BD PEN NEEDLE NANO U/F 32G X 4 MM MISC USE TO INJECT INSULINS 4 TIMES DAILY. 10/15/18   Elayne Snare, MD  fluticasone (FLONASE) 50 MCG/ACT nasal spray Place 2 sprays into both nostrils daily. 08/12/17   Briscoe Deutscher, DO  Furosemide (LASIX PO) Take by mouth.    [provider]  gabapentin (NEURONTIN) 100 MG capsule Take two pills po qAM and three pills po qHS Patient taking differently: Take 200 mg by mouth 2 (two) times daily. Take two pills po qAM and three pills po qHS 10/01/18   Sater, Nanine Means, MD  insulin aspart (NOVOLOG FLEXPEN) 100 UNIT/ML FlexPen Inject 3 units before breakfast and 4 units before supper Patient taking differently: Inject 6 units before breakfast and 8 units before supper 03/15/18   Elayne Snare, MD  insulin degludec (TRESIBA FLEXTOUCH) 100 UNIT/ML SOPN FlexTouch Pen Inject 20 units into the skin once daily 08/04/18   Elayne Snare, MD  isosorbide mononitrate (IMDUR) 30 MG 24 hr tablet TAKE 1 TABLET EVERY DAY 10/05/18   Elayne Snare, MD  lovastatin (MEVACOR) 20 MG tablet Take 1 tablet (20 mg total) by mouth at bedtime. 02/11/18   Briscoe Deutscher, DO  metoprolol tartrate (LOPRESSOR) 25 MG tablet TAKE 1 TABLET TWICE DAILY 09/08/18   Elayne Snare, MD  montelukast (SINGULAIR) 10 MG tablet TAKE ONE TABLET AT BEDTIME. 06/18/18   Briscoe Deutscher, DO  Multiple Vitamins-Minerals (CENTRUM SILVER 50+WOMEN) TABS Take 1 tablet by mouth daily.    [provider]  Multiple Vitamins-Minerals (HAIR/SKIN/NAILS/BIOTIN PO) Take 1 tablet by mouth daily.    [provider]  multivitamin-lutein (OCUVITE-LUTEIN) CAPS capsule Take 1 capsule by mouth daily.    [provider]  omeprazole (PRILOSEC) 40 MG capsule Take 1 capsule (40 mg total) by mouth daily. 07/14/18   Briscoe Deutscher, DO  POTASSIUM PO Take by mouth.    [provider]  TRUEPLUS LANCETS 28G MISC CHECK BLOOD SUGAR 3 TO 4 TIMES DAILY. 10/01/18   Elayne Snare, MD    Family History History reviewed. No pertinent family history.  Social History Social History   Tobacco Use  . Smoking status: Never Smoker  . Smokeless tobacco: Never Used  Substance Use Topics  . Alcohol use: No  . Drug use: No     Allergies   Patient has no known allergies.   Review of Systems Review of Systems  All other systems reviewed and are negative.    Physical Exam Updated  Vital Signs BP 121/60   Pulse 69   Temp 98.8 F (37.1 C) (Oral)   Resp 18   Ht 4\' 11"  (1.499 m)   Wt 56.7 kg   LMP  (LMP Unknown)   SpO2 97%   BMI 25.25 kg/m   Physical Exam Vitals signs and nursing note reviewed.  Constitutional:      General: She is not in acute distress.    Appearance: She is well-developed.  HENT:     Head: Normocephalic and atraumatic.  Neck:     Musculoskeletal: Normal range of motion.  Cardiovascular:     Rate and Rhythm: Normal rate and regular rhythm.     Heart sounds: Normal heart sounds.  Pulmonary:     Effort: Pulmonary effort is normal.     Breath sounds: Normal breath sounds.  Abdominal:     General: There is no distension.     Palpations: Abdomen is soft.     Tenderness: There is no abdominal tenderness.  Musculoskeletal: Normal range of motion.        General: No swelling or tenderness.     Right lower leg: No edema.     Left lower leg: No edema.  Skin:    General: Skin is warm and dry.  Neurological:     Mental Status: She is alert and oriented to  person, place, and time.  Psychiatric:     Comments: Anxious appearing      ED Treatments / Results  Labs (all labs ordered are listed, but only abnormal results are displayed) Labs Reviewed  CBG MONITORING, ED - Abnormal; Notable for the following components:      Result Value   Glucose-Capillary 235 (*)    All other components within normal limits    EKG None  Radiology No results found.  Procedures Procedures (including critical care time)  Medications Ordered in ED Medications - No data to display   Initial Impression / Assessment and Plan / ED Course  I have reviewed the triage vital signs and the nursing notes.  Pertinent labs & imaging results that were available during my care of the patient were reviewed by me and considered in my medical decision making (see chart for details).     Patient reports that if her blood pressure was not elevated last night she would not be seeking medical care at this time and she has no new complaints.  I agree with this.  While here in the emergency department her blood pressure has come down to normal levels without any intervention by myself.  My last evaluation when discussing discharge home with the patient her blood pressure was 121/60.  I am not sure is beneficial for her to check her blood pressure at home I think this is only worrying her more.  I will not make any alterations to her medications.  I recommended that she follow-up with her physician for blood pressure evaluation in the office.  I do not think the patient will benefit from blood pressure testing at home.  I recommended following up with her primary care physician and return in the emergency department based on any new acute symptoms that warrant evaluation.  The complaints that she describes to me today are all chronic complaints with no new acute complaints.  I do not think she needs an aggressive work-up with labs or imaging here in the emergency department.  This  was explained at length to the patient and the patient is agreeable to  outpatient plan.     Final Clinical Impressions(s) / ED Diagnoses   Final diagnoses:  Elevated blood pressure reading    ED Discharge Orders    None       Jola Schmidt, MD 11/12/18 1156

## 2018-11-18 ENCOUNTER — Other Ambulatory Visit: Payer: Self-pay | Admitting: Family Medicine

## 2018-11-18 DIAGNOSIS — K219 Gastro-esophageal reflux disease without esophagitis: Secondary | ICD-10-CM

## 2018-11-25 ENCOUNTER — Telehealth: Payer: Self-pay | Admitting: Family Medicine

## 2018-11-25 ENCOUNTER — Other Ambulatory Visit: Payer: Self-pay | Admitting: Endocrinology

## 2018-11-25 NOTE — Telephone Encounter (Signed)
Copied from [place] [id number]. Topic: Appointment Scheduling - Scheduling Inquiry for Clinic >> Oct 29, 2018 12:57 PM Lennox Solders wrote: Reason for CRM: pt is calling to see dr wallace soon pt is having trouble with her bladder she is wetting her pants. Pt unable to sleep. Please advise >> Nov 25, 2018  9:33 AM Percell Belt A wrote: Pt called back in and would still would like to know if she could be worked in.  She stated she can not make the 3rd at 7:40 and the next available is the 20th.  She would like to know if she could be worked in in a afternoon appt?  She is still having trouble with some of her meds  Best number -[phone]

## 2018-11-25 NOTE — Telephone Encounter (Signed)
Please advise would you like me to work in?

## 2018-11-25 NOTE — Telephone Encounter (Signed)
Okay to work in

## 2018-11-26 ENCOUNTER — Other Ambulatory Visit: Payer: Self-pay | Admitting: Family Medicine

## 2018-11-26 NOTE — Telephone Encounter (Signed)
Left message to return call to our office.  

## 2018-11-30 ENCOUNTER — Ambulatory Visit: Payer: Medicare HMO | Admitting: Family Medicine

## 2018-11-30 NOTE — Telephone Encounter (Signed)
Called patient l/m to call office. Can you try to reach to have patient worked in?

## 2018-12-01 ENCOUNTER — Encounter: Payer: Self-pay | Admitting: Physician Assistant

## 2018-12-01 ENCOUNTER — Ambulatory Visit (INDEPENDENT_AMBULATORY_CARE_PROVIDER_SITE_OTHER): Payer: Medicare HMO | Admitting: Physician Assistant

## 2018-12-01 ENCOUNTER — Ambulatory Visit: Payer: Self-pay | Admitting: *Deleted

## 2018-12-01 VITALS — BP 128/60 | HR 88 | Temp 99.0°F | Ht 59.0 in | Wt 125.0 lb

## 2018-12-01 DIAGNOSIS — E875 Hyperkalemia: Secondary | ICD-10-CM | POA: Diagnosis not present

## 2018-12-01 DIAGNOSIS — G4700 Insomnia, unspecified: Secondary | ICD-10-CM | POA: Diagnosis not present

## 2018-12-01 MED ORDER — MIRTAZAPINE 7.5 MG PO TABS: 7.5000 mg | ORAL_TABLET | Freq: Every day | ORAL | 0 refills | Status: DC

## 2018-12-01 NOTE — Telephone Encounter (Signed)
See note

## 2018-12-01 NOTE — Patient Instructions (Signed)
It was great to see you!  We will re-check your labs.   Start 7.5 mg remeron daily - this can help you sleep.  Follow-up with Dr. Juleen China in 3 weeks, sooner if you have concerns.  Take care,  Inda Coke PA-C

## 2018-12-01 NOTE — Telephone Encounter (Signed)
Fyi has app with you today

## 2018-12-01 NOTE — Progress Notes (Signed)
Vanessa Gibson is a 85 y.o. female here for a follow up of a pre-existing problem.  I acted as a Education administrator for [employer], PA-C Anselmo Pickler, LPN  History of Present Illness:   Chief Complaint  Patient presents with  . Fatigue    HPI  Pt is here with multiple complaints: not feeling well for months, SOB when bending over, weakness/fatigue, problems controlling her bladder, insomnia. Per chart review, most, if not all, of these issues are chronic complaints for patient.  She saw Dr. Felecia Shelling in neuro on 10/01/18, and to help with her pain, her gabapentin was increased from 100 mg TID to 200 mg in AM and 300 mg in PM. On 10/14/18, patient called Dr. Garth Bigness office to discuss her gabapentin dosage, stated that increased dosage was causing jitteriness, insomnia, and elevated blood sugar. She was advised to change her regimen to 200 mg in AM and 200 mg in PM. She did that for awhile but now she is taking 200 mg in AM and 100 mg in PM.  She is here today wondering if this medication change is causing her symptoms.She also states that she is not sleeping well and hasn't been for a while.  Per chart review she was started on 7.5 mg Remeron daily back in October. She has no recollection of this. She states that she is currently not taking this medication.  Past Medical History:  Diagnosis Date  . Anxiety   . Arthritis   . Asthma   . Breast cancer ([place]), bilateral   . Chronic pancreatitis ([place])   . Depression   . Dizziness   . GERD (gastroesophageal reflux disease)   . History of hiatal hernia   . Hyperlipemia   . Hypertension   . IBS (irritable bowel syndrome)   . Mandibular fracture ([place]) 12/04/2015  . MI (myocardial infarction) ([place])   . Skin cancer of face      Social History   Socioeconomic History  . Marital status: Divorced    Spouse name: Not on file  . Number of children: Not on file  . Years of education: Not on file  . Highest education level: Not on file  Occupational  History  . Not on file  Social Needs  . Financial resource strain: Not on file  . Food insecurity:    Worry: Not on file    Inability: Not on file  . Transportation needs:    Medical: Not on file    Non-medical: Not on file  Tobacco Use  . Smoking status: Never Smoker  . Smokeless tobacco: Never Used  Substance and Sexual Activity  . Alcohol use: No  . Drug use: No  . Sexual activity: Never  Lifestyle  . Physical activity:    Days per week: Not on file    Minutes per session: Not on file  . Stress: Not on file  Relationships  . Social connections:    Talks on phone: Not on file    Gets together: Not on file    Attends religious service: Not on file    Active member of club or organization: Not on file    Attends meetings of clubs or organizations: Not on file    Relationship status: Not on file  . Intimate partner violence:    Fear of current or ex partner: Not on file    Emotionally abused: Not on file    Physically abused: Not on file    Forced sexual activity: Not on  file  Other Topics Concern  . Not on file  Social History Narrative   Lives at [facility], [place].    Caffeine use:     Past Surgical History:  Procedure Laterality Date  . BREAST SURGERY    . CATARACT EXTRACTION W/ INTRAOCULAR LENS  IMPLANT, BILATERAL Bilateral   . CHOLECYSTECTOMY OPEN  1970's  . MASTECTOMY Bilateral     History reviewed. No pertinent family history.  No Known Allergies  Current Medications:   Current Outpatient Medications:  .  ACCU-CHEK GUIDE test strip, CHECK BLOOD SUGAR THREE TO FOUR TIMES DAILY, Disp: 400 each, Rfl: 2 .  acetaminophen (TYLENOL) 325 MG tablet, Take 2 tablets (650 mg total) by mouth every 6 (six) hours as needed for moderate pain or headache., Disp: , Rfl:  .  Alcohol Swabs (B-D SINGLE USE SWABS REGULAR) PADS, USE TO CLEAN FINGER BEFORE CHECKING BLOOD SUGARS AND CLEAN SKIN BEFORE INSULIN INJECTIONS., Disp: 300 each, Rfl: 2 .  Ascorbic  Acid (VITAMIN C) 1000 MG tablet, Take 1,000 mg by mouth daily., Disp: , Rfl:  .  aspirin EC 81 MG tablet, Take 81 mg by mouth daily., Disp: , Rfl:  .  BD PEN NEEDLE NANO U/F 32G X 4 MM MISC, USE TO INJECT INSULINS 4 TIMES DAILY., Disp: 200 each, Rfl: 0 .  Blood Glucose Monitoring Suppl (ACCU-CHEK GUIDE) w/Device KIT, USE AS DIRECTED, Disp: 1 kit, Rfl: 1 .  fluticasone (FLONASE) 50 MCG/ACT nasal spray, Place 2 sprays into both nostrils daily., Disp: 16 g, Rfl: 0 .  furosemide (LASIX) 20 MG tablet, TAKE 1 TABLET ONCE DAILY., Disp: 30 tablet, Rfl: 0 .  gabapentin (NEURONTIN) 100 MG capsule, Take two pills po qAM and three pills po qHS (Patient taking differently: Take 200 mg by mouth 2 (two) times daily. Take two pills po qAM and three pills po qHS), Disp: 150 capsule, Rfl: 5 .  insulin aspart (NOVOLOG FLEXPEN) 100 UNIT/ML FlexPen, Inject 3 units before breakfast and 4 units before supper (Patient taking differently: Inject 6 units before breakfast and 8 units before supper), Disp: 5 pen, Rfl: 4 .  insulin degludec (TRESIBA FLEXTOUCH) 100 UNIT/ML SOPN FlexTouch Pen, Inject 20 units into the skin once daily, Disp: 15 mL, Rfl: 1 .  isosorbide mononitrate (IMDUR) 30 MG 24 hr tablet, TAKE 1 TABLET EVERY DAY, Disp: 90 tablet, Rfl: 0 .  lovastatin (MEVACOR) 20 MG tablet, Take 1 tablet (20 mg total) by mouth at bedtime., Disp: 90 tablet, Rfl: 3 .  metoprolol tartrate (LOPRESSOR) 25 MG tablet, TAKE 1 TABLET TWICE DAILY, Disp: 180 tablet, Rfl: 2 .  montelukast (SINGULAIR) 10 MG tablet, TAKE ONE TABLET AT BEDTIME., Disp: 30 tablet, Rfl: 6 .  Multiple Vitamins-Minerals (CENTRUM SILVER 50+WOMEN) TABS, Take 1 tablet by mouth daily., Disp: , Rfl:  .  Multiple Vitamins-Minerals (HAIR/SKIN/NAILS/BIOTIN PO), Take 1 tablet by mouth daily., Disp: , Rfl:  .  multivitamin-lutein (OCUVITE-LUTEIN) CAPS capsule, Take 1 capsule by mouth daily., Disp: , Rfl:  .  omeprazole (PRILOSEC) 40 MG capsule, TAKE 1 CAPSULE EVERY DAY.,  Disp: 90 capsule, Rfl: 1 .  potassium chloride (K-DUR,KLOR-CON) 10 MEQ tablet, Take 10 mEq by mouth daily., Disp: , Rfl:  .  TRUEPLUS LANCETS 28G MISC, CHECK BLOOD SUGAR 3 TO 4 TIMES DAILY., Disp: 100 each, Rfl: 0 .  mirtazapine (REMERON) 7.5 MG tablet, Take 1 tablet (7.5 mg total) by mouth at bedtime., Disp: 30 tablet, Rfl: 0   Review of Systems:   ROS  Negative unless otherwise specified per HPI.   Vitals:   Vitals:   12/01/18 1445  BP: 128/60  Pulse: 88  Temp: 99 F (37.2 C)  TempSrc: Oral  SpO2: 96%  Weight: 125 lb (56.7 kg)  Height: 4' 11"  (1.499 m)     Body mass index is 25.25 kg/m.  Physical Exam:   Physical Exam Vitals signs and nursing note reviewed.  Constitutional:      General: She is not in acute distress.    Appearance: She is well-developed. She is not ill-appearing or toxic-appearing.  Cardiovascular:     Rate and Rhythm: Normal rate and regular rhythm.     Pulses: Normal pulses.     Heart sounds: Normal heart sounds, S1 normal and S2 normal.     Comments: Trace bilateral LE edema Pulmonary:     Effort: Pulmonary effort is normal.     Breath sounds: Normal breath sounds.  Skin:    General: Skin is warm and dry.  Neurological:     Mental Status: She is alert.     GCS: GCS eye subscore is 4. GCS verbal subscore is 5. GCS motor subscore is 6.  Psychiatric:        Speech: Speech normal.        Behavior: Behavior normal. Behavior is cooperative.      Assessment and Plan:   Vanessa Gibson was seen today for fatigue.  Diagnoses and all orders for this visit:  Hyperkalemia Re-check. -     Basic metabolic panel  Insomnia, unspecified type Reviewed her medications that she brought today. She is not taking remeron. After discussion, she would like to take this to see if it helps with her sleep. Start today and follow-up in 3 weeks with PCP, sooner if issues/concerns.  Other orders -     mirtazapine (REMERON) 7.5 MG tablet; Take 1 tablet (7.5 mg total)  by mouth at bedtime.    . Reviewed expectations re: course of current medical issues. . Discussed self-management of symptoms. . Outlined signs and symptoms indicating need for more acute intervention. . Patient verbalized understanding and all questions were answered. . See orders for this visit as documented in the electronic medical record. . Patient received an After-Visit Summary.  CMA or LPN served as scribe during this visit. History, Physical, and Plan performed by medical provider. The above documentation has been reviewed and is accurate and complete.   Inda Coke, PA-C

## 2018-12-01 NOTE — Telephone Encounter (Signed)
Pt called with complaints of not feeling well for months; the pt says that she has shortness of breath when she bends over, weakness/fatigue, problems controlling her bladder, tiredness, not sleeping well;  she says that her gabapentin was increased then decreased by Dr Felecia Shelling (see telephone encounter dated 10/14/2018);  the pt wonders if this medication is causing her symptoms; recommendations made per nurse triage protocol; the pt normally works with Dr Briscoe Deutscher but she has no availability; spoke with [place] and she states that Inda Coke can see the pt 12/01/2018 in a 40 minute slot; pt offered and accepted appointment with Inda Coke, [place], 12/01/2018 at 1440; transportation arranged with the pt's driver, Elta [place]; she verbalized understanding; will route to office for notification of this upcoming appointment; also the pt has appointment scheduled with Dr Juleen China on 12/15/2018 at 1300 for bladder control; please verify if this appointment is still needed, and cancel if not.       Reason for Disposition . Taking a medicine that could cause weakness (e.g., blood pressure medications, diuretics)  Answer Assessment - Initial Assessment Questions 1. DESCRIPTION: "Describe how you are feeling."     Weak, tired, no energy  2. SEVERITY: "How bad is it?"  "Can you stand and walk?"   - MILD - Feels weak or tired, but does not interfere with work, school or normal activities   - [place] to stand and walk; weakness interferes with work, school, or normal activities   - SEVERE - Unable to stand or walk     moderate 3. ONSET:  "When did the weakness begin?"     2-3 months 4. CAUSE: "What do you think is causing the weakness?"     Change in medication 5. MEDICINES: "Have you recently started a new medicine or had a change in the amount of a medicine?"     yes 6. OTHER SYMPTOMS: "Do you have any other symptoms?" (e.g., chest pain, fever, cough, SOB, vomiting, diarrhea, bleeding,  other areas of pain)     Short of breath when she bends down 7. PREGNANCY: "Is there any chance you are pregnant?" "When was your last menstrual period?"     no  Protocols used: WEAKNESS (GENERALIZED) AND FATIGUE-A-AH

## 2018-12-02 LAB — BASIC METABOLIC PANEL
BUN: 34 mg/dL — AB (ref 6–23)
CHLORIDE: 103 meq/L (ref 96–112)
CO2: 25 mEq/L (ref 19–32)
Calcium: 9.8 mg/dL (ref 8.4–10.5)
Creatinine, Ser: 1.47 mg/dL — ABNORMAL HIGH (ref 0.40–1.20)
GFR: 35.64 mL/min — ABNORMAL LOW (ref >60.00)
Glucose, Bld: 128 mg/dL — ABNORMAL HIGH (ref 70–99)
Potassium: 4.6 mEq/L (ref 3.5–5.1)
Sodium: 138 mEq/L (ref 135–145)

## 2018-12-07 ENCOUNTER — Telehealth: Payer: Self-pay | Admitting: *Deleted

## 2018-12-07 ENCOUNTER — Encounter

## 2018-12-07 ENCOUNTER — Encounter: Payer: Medicare HMO | Admitting: Neurology

## 2018-12-07 ENCOUNTER — Ambulatory Visit (INDEPENDENT_AMBULATORY_CARE_PROVIDER_SITE_OTHER): Payer: Medicare HMO | Admitting: Neurology

## 2018-12-07 DIAGNOSIS — E1142 Type 2 diabetes mellitus with diabetic polyneuropathy: Secondary | ICD-10-CM

## 2018-12-07 DIAGNOSIS — M542 Cervicalgia: Secondary | ICD-10-CM

## 2018-12-07 DIAGNOSIS — R200 Anesthesia of skin: Secondary | ICD-10-CM

## 2018-12-07 DIAGNOSIS — Z0289 Encounter for other administrative examinations: Secondary | ICD-10-CM

## 2018-12-07 DIAGNOSIS — G5603 Carpal tunnel syndrome, bilateral upper limbs: Secondary | ICD-10-CM | POA: Diagnosis not present

## 2018-12-07 MED ORDER — GABAPENTIN 300 MG PO CAPS: ORAL_CAPSULE | ORAL | 5 refills | Status: DC

## 2018-12-07 NOTE — Telephone Encounter (Signed)
Faxed printed/signed rx for wrist splints for CTS to [facility] at [phone]. Received fax confirmation.

## 2018-12-07 NOTE — Progress Notes (Signed)
Full Name: Vanessa Gibson Gender: Female MRN #: [id number] Date of Birth: 1928-07-26    Visit Date: 12/07/2018 13:30 Age: 85 Years 35 Months Old Examining Physician: Arlice Colt, MD  Referring Physician: Arlice Colt, MD     History: Vanessa Gibson is an 85 year old woman with numbness in the hands, right greater than left, greater than the feet.  She often awakens with severe pain in the hands, especially on the right.  She also reports neck pain that is worse on the left side but there is no radiation into the arms.  She notes that her symptoms began after some sand-like material fell from her ceiling onto her.  Nerve conduction studies: The median motor responses were slowed across the wrist, right worse than left and the amplitude was reduced on the right.  Forearm conduction was normal on the left and near normal on the right.  The left ulnar motor responses had normal distal latency, amplitude and conduction velocity.  The right ulnar motor response was slowed across the elbow but had normal amplitude and distal latency.  The right peroneal motor response was mildly reduced in amplitude with low normal conduction velocity.  The tibial motor response was normal.  The right sural sensory response was reduced in amplitude in the right superficial peroneal sensory response was absent.  The right median sensory response was absent.  The left median sensory response was reduced in amplitude and borderline prolonged.  The right ulnar sensory response was mildly prolonged and mildly reduced in amplitude while the left ulnar sensory response was normal.  Electromyography: Needle EMG of the right arm, right leg and left hand was performed.   In the right arm, there was mild chronic denervation in the median innervated APB hand muscle.  Some polyphasic motor units were observed in two C7 innervated muscles though motor unit recruitment was normal.  In the left hand, the APB muscle was normal.  In the  right leg, there was mild chronic denervation in some of the L4, L5 and S1 innervated muscles and moderate chronic denervation in the gastrocnemius and the abductor hallucis foot muscles tested.   There was no abnormal spontaneous activity in any of the muscles tested.  Impression: This NCV/EMG study shows the following: 1.   Mild length dependent sensorimotor polyneuropathy consistent with a diabetic polyneuropathy. 2.   Moderately severe right median neuropathy at the wrist (carpal tunnel syndrome ) and moderate left median neuropathy at the wrist. 3.   Superimposed S1+/- L4 or L5 chronic radiculopathy without active features. 4.   Possible mild C7 chronic radiculopathy  Vanessa Gibson A. Felecia Shelling, MD, PhD, FAAN Certified in Neurology, Clinical Neurophysiology, Sleep Medicine, Pain Medicine and Neuroimaging Director, [place] [facility] [address] [place], [place] [address] [phone]   Clinical Note:   To help with her pain, the gabapentin dose will be increased.  I also ordered wrist splints for her carpal tunnel syndromes and discussed that if symptoms do not improve surgery could be considered.  She was not interested in pursuing surgery, however. -- RAS      MNC    Nerve / Sites Muscle Latency Ref. Amplitude Ref. Rel Amp Segments Distance Velocity Ref. Area    ms ms mV mV %  cm m/s m/s mVms  L Median - APB     Wrist APB 5.5 ?4.4 6.0 ?4.0 100 Wrist - APB 7  20.4     Upper arm APB 9.6  5.9  97.9 Upper arm - Wrist 20 49 ?49 19.9  R Median - APB     Wrist APB 9.0 ?4.4 3.7 ?4.0 100 Wrist - APB 7   11.1     Upper arm APB 13.6  3.5  94.5 Upper arm - Wrist 20 44 ?49 10.9  L Ulnar - ADM     Wrist ADM 2.8 ?3.3 9.2 ?6.0 100 Wrist - ADM 7   21.0     B.Elbow ADM 5.7  7.8  84.2 B.Elbow - Wrist 15 51 ?49 21.3     VanessaElbow ADM 7.7  7.7  99.2 VanessaElbow - B.Elbow 10 49 ?49 21.3         VanessaElbow - Wrist      R Ulnar -  ADM     Wrist ADM 3.0 ?3.3 8.0 ?6.0 100 Wrist - ADM 7   19.1     B.Elbow ADM 5.6  7.3  91.5 B.Elbow - Wrist 15 58 ?49 19.2     VanessaElbow ADM 8.3  6.6  91.2 VanessaElbow - B.Elbow 10 37 ?49 17.9         VanessaElbow - Wrist      R Peroneal - EDB     Ankle EDB 5.6 ?6.5 1.7 ?2.0 100 Ankle - EDB 9   5.4     Fib head EDB 11.6  1.5  85.4 Fib head - Ankle 26 44 ?44 5.1     Pop fossa EDB 13.9  1.2  78.1 Pop fossa - Fib head 10 44 ?44 3.9         Pop fossa - Ankle      R Tibial - AH     Ankle AH 4.4 ?5.8 5.5 ?4.0 100 Ankle - AH 9   12.1     Pop fossa AH 12.5  3.2  58.6 Pop fossa - Ankle 33 41 ?41 7.1                  SNC    Nerve / Sites Rec. Site Peak Lat Ref.  Amp Ref. Segments Distance    ms ms V V  cm  R Sural - Ankle (Calf)     Calf Ankle 4.2 ?4.4 4 ?6 Calf - Ankle 14  R Superficial peroneal - Ankle     Lat leg Ankle NR ?4.4 NR ?6 Lat leg - Ankle 14  L Median - Orthodromic (Dig II, Mid palm)     Dig II Wrist 3.4 ?3.4 6 ?10 Dig II - Wrist 13  R Median - Orthodromic (Dig II, Mid palm)     Dig II Wrist NR ?3.4 NR ?10 Dig II - Wrist 13  L Ulnar - Orthodromic, (Dig V, Mid palm)     Dig V Wrist 3.0 ?3.1 8 ?5 Dig V - Wrist 11  R Ulnar - Orthodromic, (Dig V, Mid palm)     Dig V Wrist 3.5 ?3.1 3 ?5 Dig V - Wrist 71                 F  Wave    Nerve F Lat Ref.   ms ms  L Ulnar - ADM 27.6 ?32.0  R Ulnar - ADM 28.9 ?32.0  R Tibial - AH 55.7 ?56.0           EMG full       EMG Summary Table    Spontaneous MUAP Recruitment  Muscle IA Fib PSW Fasc Other Amp Dur. Poly Pattern  R. Deltoid Normal None None None _______ Normal Normal Normal Normal  R. Triceps brachii Normal None None None _______ Normal Normal 1+ Normal  R. Biceps brachii Normal None None None _______ Normal Normal Normal Normal  R. Extensor digitorum communis Normal None None None _______ Normal Normal 1+ Normal  R. First dorsal interosseous Normal None None None _______ Normal Normal Normal Normal  R. Abductor pollicis brevis Normal  None None None _______ Normal Increased 1+ Normal  R. Vastus medialis Normal None None None _______ Normal Normal 1+ Normal  R. Tibialis anterior Normal None None None _______ Normal Increased 1+ Reduced  R. Peroneus longus Normal None None None _______ Normal Normal 1+ Reduced  R. Gastrocnemius (Medial head) Normal None None None _______ Increased Increased 2+ Reduced  R. Gluteus medius Normal None None None _______ Normal Inc 1+   1+ Normal  R. Abductor hallucis Normal None None None _______ Increased Increased 2+ Reduced  L. Abductor pollicis brevis Normal None None None _______ Normal Normal Normal Normal

## 2018-12-10 ENCOUNTER — Ambulatory Visit: Payer: Self-pay | Admitting: *Deleted

## 2018-12-10 NOTE — Telephone Encounter (Signed)
  Reason for Disposition . Caller has NON-URGENT medication question about med that PCP prescribed and triager unable to answer question  Protocols used: MEDICATION QUESTION CALL-A-AH  

## 2018-12-10 NOTE — Telephone Encounter (Signed)
Called patient she was told to increase to one cab tid of the 300mg 

## 2018-12-10 NOTE — Telephone Encounter (Signed)
See note

## 2018-12-10 NOTE — Telephone Encounter (Addendum)
Patient is calling in with some questions about gabapentin (NEURONTIN) 300 MG capsule and the direction change   Patient was originally prescribed the Rx by Dr Juleen China and patient is concerned about the increase in the dosage by the neurologist. She is concerned that the increase may be too much for her and wants input from Dr Juleen China before she increases.  She also states there was discussion about a CT scan for her neck pain but it was not ordered- can that be done? Patient gives permission to leave message on her voicemail if she is not at home for call back.   FYI: She reports she is sleeping better on the Remeron.

## 2018-12-12 NOTE — Telephone Encounter (Signed)
Dr. Felecia Shelling is a very good Neurologist. I support the increase. Fall precautions always.

## 2018-12-13 ENCOUNTER — Ambulatory Visit: Payer: Medicare HMO | Admitting: Family Medicine

## 2018-12-13 ENCOUNTER — Encounter: Payer: Self-pay | Admitting: Cardiology

## 2018-12-13 ENCOUNTER — Ambulatory Visit: Payer: Medicare HMO | Admitting: Cardiology

## 2018-12-13 ENCOUNTER — Other Ambulatory Visit: Payer: Self-pay | Admitting: Endocrinology

## 2018-12-13 VITALS — BP 122/60 | HR 93 | Ht 59.0 in | Wt 125.4 lb

## 2018-12-13 DIAGNOSIS — I700 Atherosclerosis of aorta: Secondary | ICD-10-CM

## 2018-12-13 DIAGNOSIS — R0602 Shortness of breath: Secondary | ICD-10-CM

## 2018-12-13 DIAGNOSIS — I2510 Atherosclerotic heart disease of native coronary artery without angina pectoris: Secondary | ICD-10-CM | POA: Diagnosis not present

## 2018-12-13 NOTE — Progress Notes (Signed)
Cardiology Office Note:    Date:  12/13/2018   ID:  Vanessa Gibson, DOB 09-29-1928, MRN [id number]  PCP:  Briscoe Deutscher, DO  Cardiologist:  Candee Furbish, MD  Electrophysiologist:  None   Referring MD: Briscoe Deutscher, DO    History of Present Illness:    Vanessa Gibson is a 85 y.o. female previously seen over 3 years ago here for evaluation of coronary artery disease status post MI in 1999 with PTCA only, hypertension hyperlipidemia chronic pancreatitis chronic vertigo right ear pain breast cancer double mastectomy with prior syncope.  She was recently in the ER on 11/12/2018 with elevated blood pressure.  Before discharge from ER she was 121/60.  Agree that it is not beneficial for her to check this at home.  ECHO 11/13/15: - Left ventricle: The cavity size was normal. Wall thickness was   increased in a pattern of mild LVH. There was focal basal   hypertrophy. Systolic function was vigorous. The estimated   ejection fraction was in the range of 65% to 70%. - Mitral valve: There was mild regurgitation. - Pulmonary arteries: Systolic pressure was mildly increased. PA   peak pressure: 37 mm Hg (S).  She has been complaining of shortness of breath.  She had her roof, ceiling fan worked on at her independent living facility and there was plaster everywhere she states.  Since then she is felt poor.  She feels more short of breath than usual.  No chest pain.  No syncope no bleeding no fevers.  No wheezes.  She even feels that her voice is hoarse.  Past Medical History:  Diagnosis Date  . Anxiety   . Arthritis   . Asthma   . Breast cancer ([place]), bilateral   . Chronic pancreatitis ([place])   . Depression   . Dizziness   . GERD (gastroesophageal reflux disease)   . History of hiatal hernia   . Hyperlipemia   . Hypertension   . IBS (irritable bowel syndrome)   . Mandibular fracture ([place]) 12/04/2015  . MI (myocardial infarction) ([place])   . Skin cancer of face     Past Surgical History:    Procedure Laterality Date  . BREAST SURGERY    . CATARACT EXTRACTION W/ INTRAOCULAR LENS  IMPLANT, BILATERAL Bilateral   . CHOLECYSTECTOMY OPEN  1970's  . MASTECTOMY Bilateral     Current Medications: Current Meds  Medication Sig  . ACCU-CHEK GUIDE test strip CHECK BLOOD SUGAR THREE TO FOUR TIMES DAILY  . acetaminophen (TYLENOL) 325 MG tablet Take 2 tablets (650 mg total) by mouth every 6 (six) hours as needed for moderate pain or headache.  . Alcohol Swabs (B-D SINGLE USE SWABS REGULAR) PADS USE TO CLEAN FINGER BEFORE CHECKING BLOOD SUGARS AND CLEAN SKIN BEFORE INSULIN INJECTIONS.  Marland Kitchen Ascorbic Acid (VITAMIN C) 1000 MG tablet Take 1,000 mg by mouth daily.  Marland Kitchen aspirin EC 81 MG tablet Take 81 mg by mouth daily.  . BD PEN NEEDLE NANO U/F 32G X 4 MM MISC USE TO INJECT INSULINS 4 TIMES DAILY.  Marland Kitchen Blood Glucose Monitoring Suppl (ACCU-CHEK GUIDE) w/Device KIT USE AS DIRECTED  . fluticasone (FLONASE) 50 MCG/ACT nasal spray Place 2 sprays into both nostrils daily.  . furosemide (LASIX) 20 MG tablet TAKE 1 TABLET ONCE DAILY.  Marland Kitchen gabapentin (NEURONTIN) 300 MG capsule One po tid  . insulin aspart (NOVOLOG FLEXPEN) 100 UNIT/ML FlexPen Inject 3 units before breakfast and 4 units before supper (Patient taking differently: Inject 6 units before breakfast  and 8 units before supper)  . insulin degludec (TRESIBA FLEXTOUCH) 100 UNIT/ML SOPN FlexTouch Pen Inject 20 units into the skin once daily  . lovastatin (MEVACOR) 20 MG tablet Take 1 tablet (20 mg total) by mouth at bedtime.  . metoprolol tartrate (LOPRESSOR) 25 MG tablet TAKE 1 TABLET TWICE DAILY  . mirtazapine (REMERON) 7.5 MG tablet Take 1 tablet (7.5 mg total) by mouth at bedtime.  . montelukast (SINGULAIR) 10 MG tablet TAKE ONE TABLET AT BEDTIME.  . Multiple Vitamins-Minerals (CENTRUM SILVER 50+WOMEN) TABS Take 1 tablet by mouth daily.  . Multiple Vitamins-Minerals (HAIR/SKIN/NAILS/BIOTIN PO) Take 1 tablet by mouth daily.  . multivitamin-lutein  (OCUVITE-LUTEIN) CAPS capsule Take 1 capsule by mouth daily.  Marland Kitchen omeprazole (PRILOSEC) 40 MG capsule TAKE 1 CAPSULE EVERY DAY.  Marland Kitchen potassium chloride (K-DUR,KLOR-CON) 10 MEQ tablet Take 10 mEq by mouth daily.  . TRUEPLUS LANCETS 28G MISC CHECK BLOOD SUGAR 3 TO 4 TIMES DAILY.  . [DISCONTINUED] isosorbide mononitrate (IMDUR) 30 MG 24 hr tablet TAKE 1 TABLET EVERY DAY     Allergies:   Patient has no known allergies.   Social History   Socioeconomic History  . Marital status: Divorced    Spouse name: Not on file  . Number of children: Not on file  . Years of education: Not on file  . Highest education level: Not on file  Occupational History  . Not on file  Social Needs  . Financial resource strain: Not on file  . Food insecurity:    Worry: Not on file    Inability: Not on file  . Transportation needs:    Medical: Not on file    Non-medical: Not on file  Tobacco Use  . Smoking status: Never Smoker  . Smokeless tobacco: Never Used  Substance and Sexual Activity  . Alcohol use: No  . Drug use: No  . Sexual activity: Never  Lifestyle  . Physical activity:    Days per week: Not on file    Minutes per session: Not on file  . Stress: Not on file  Relationships  . Social connections:    Talks on phone: Not on file    Gets together: Not on file    Attends religious service: Not on file    Active member of club or organization: Not on file    Attends meetings of clubs or organizations: Not on file    Relationship status: Not on file  Other Topics Concern  . Not on file  Social History Narrative   Lives at [facility] [place].    Caffeine use:      Family History: The patient's family history is not on file.  Mother and father did not have early coronary disease  ROS:   Please see the history of present illness.     All other systems reviewed and are negative.  EKGs/Labs/Other Studies Reviewed:    The following studies were reviewed today: ER notes  hospital records echocardiogram EKG  EKG:  EKG is not ordered today.  Prior from ER reviewed, sinus rhythm  Recent Labs: 04/20/2018: B Natriuretic Peptide 61.7 06/18/2018: Pro B Natriuretic peptide (BNP) 177.0; TSH 1.80 08/26/2018: Hemoglobin 11.3; Platelets 241.0 08/31/2018: ALT 15 12/01/2018: BUN 34; Creatinine, Ser 1.47; Potassium 4.6; Sodium 138  Recent Lipid Panel    Component Value Date/Time   CHOL 146 03/17/2018 1104   TRIG 114.0 03/17/2018 1104   HDL 52.30 03/17/2018 1104   CHOLHDL 3 03/17/2018 1104   VLDL 22.8  03/17/2018 1104   LDLCALC 71 03/17/2018 1104    Physical Exam:    VS:  BP 122/60   Pulse 93   Ht _0  (1.499 m)   Wt 125 lb 6.4 oz (56.9 kg)   LMP  (LMP Unknown)   SpO2 97%   BMI 25.33 kg/m     Wt Readings from Last 3 Encounters:  12/13/18 125 lb 6.4 oz (56.9 kg)  12/01/18 125 lb (56.7 kg)  11/12/18 125 lb (56.7 kg)     GEN: Elderly Well nourished, well developed in no acute distress HEENT: Normal NECK: No JVD; No carotid bruits LYMPHATICS: No lymphadenopathy CARDIAC: RRR, no murmurs, rubs, gallops RESPIRATORY:  Clear to auscultation without rales, wheezing or rhonchi  ABDOMEN: Soft, non-tender, non-distended MUSCULOSKELETAL:  No edema; No deformity  SKIN: Warm and dry NEUROLOGIC:  Alert and oriented x 3 PSYCHIATRIC:  Normal affect   ASSESSMENT:    1. Shortness of breath   2. Aortic atherosclerosis ([place]), 2018 CXR   3. Coronary artery disease involving native coronary artery of native heart without angina pectoris    PLAN:    In order of problems listed above:  Shortness of breath - We will check an echocardiogram to update pump function.  Previously normal. -She did have some elevated pulmonary pressures on prior echocardiogram, possibly from underlying pulmonary disease.  DLCO was mildly low on pulmonary function studies May 2019.  Essential hypertension -Agree with Dr. Venora Maples, will try to avoid taking this at home, anxiety can provoke  higher response.  Excellent today.   Coronary artery disease -Stable, PTCA 1999.  Used to see cardiology in [place] several years ago.  Discontinued isosorbide previously.  She is not having any anginal symptoms of chest discomfort.  Aortic atherosclerosis -Continue with secondary prevention including hypertension control.  Medication Adjustments/Labs and Tests Ordered: Current medicines are reviewed at length with the patient today.  Concerns regarding medicines are outlined above.  Orders Placed This Encounter  Procedures  . ECHOCARDIOGRAM COMPLETE   No orders of the defined types were placed in this encounter.   Patient Instructions  Medication Instructions:  The current medical regimen is effective;  continue present plan and medications.  If you need a refill on your cardiac medications before your next appointment, please call your pharmacy.   Testing/Procedures: Your physician has requested that you have an echocardiogram. Echocardiography is a painless test that uses sound waves to create images of your heart. It provides your doctor with information about the size and shape of your heart and how well your heart's chambers and valves are working. This procedure takes approximately one hour. There are no restrictions for this procedure.  Follow-Up: At [facility], you and your health needs are our priority.  As part of our continuing mission to provide you with exceptional heart care, we have created designated Provider Care Teams.  These Care Teams include your primary Cardiologist (physician) and Advanced Practice Providers (APPs -  Physician Assistants and Nurse Practitioners) who all work together to provide you with the care you need, when you need it. You will need a follow up appointment in 12 months.  Please call our office 2 months in advance to schedule this appointment.  You may see Candee Furbish, MD or one of the following Advanced Practice Providers on your  designated Care Team:   Truitt Merle, NP Cecilie Kicks, NP . Kathyrn Drown, NP  Thank you for choosing [facility]!!  Signed, Candee Furbish, MD  12/13/2018 2:10 PM    [address]

## 2018-12-13 NOTE — Patient Instructions (Signed)
Medication Instructions:  The current medical regimen is effective;  continue present plan and medications.  If you need a refill on your cardiac medications before your next appointment, please call your pharmacy.   Testing/Procedures: Your physician has requested that you have an echocardiogram. Echocardiography is a painless test that uses sound waves to create images of your heart. It provides your doctor with information about the size and shape of your heart and how well your heart's chambers and valves are working. This procedure takes approximately one hour. There are no restrictions for this procedure.  Follow-Up: At [facility], you and your health needs are our priority.  As part of our continuing mission to provide you with exceptional heart care, we have created designated Provider Care Teams.  These Care Teams include your primary Cardiologist (physician) and Advanced Practice Providers (APPs -  Physician Assistants and Nurse Practitioners) who all work together to provide you with the care you need, when you need it. You will need a follow up appointment in 12 months.  Please call our office 2 months in advance to schedule this appointment.  You may see Mark Skains, MD or one of the following Advanced Practice Providers on your designated Care Team:   Lori Gerhardt, NP Laura Ingold, NP . Jill McDaniel, NP  Thank you for choosing [facility]!!     

## 2018-12-13 NOTE — Telephone Encounter (Signed)
Called patient per DPR left detailed information about falls. Informed to call if any questions.

## 2018-12-14 ENCOUNTER — Telehealth: Payer: Self-pay | Admitting: Family Medicine

## 2018-12-14 NOTE — Telephone Encounter (Signed)
See note  Copied from [place] [id number]. Topic: General - Inquiry >> Dec 14, 2018  9:26 AM Sheran Luz wrote: Reason for CRM: Patient called requesting to speak with Joellen regarding some additional questions she has about medication gabapentin (NEURONTIN) 300 MG capsule. See triage from 01/17.

## 2018-12-15 ENCOUNTER — Ambulatory Visit: Payer: Medicare HMO | Admitting: Family Medicine

## 2018-12-15 NOTE — Telephone Encounter (Signed)
Patient called back she wanted to know how she can take gabapentin that dose she is on is making her mouth dry. Let her know that neurologist is the specialist. Dr. Juleen China has full confidence in him. That he is the the one who knows more about the gabapentin. She will call his office and let them know what's going on and how or if she needs to change meds.

## 2018-12-15 NOTE — Telephone Encounter (Signed)
Left message to return call to our office.  

## 2018-12-21 ENCOUNTER — Other Ambulatory Visit (HOSPITAL_COMMUNITY): Payer: Medicare HMO

## 2018-12-22 ENCOUNTER — Other Ambulatory Visit: Payer: Self-pay | Admitting: Family Medicine

## 2018-12-23 ENCOUNTER — Telehealth: Payer: Self-pay | Admitting: Family Medicine

## 2018-12-23 NOTE — Telephone Encounter (Signed)
Patient called needs order for bras. She had mastectomy. She did not have name or fax number for company.   Contact : tina Phone: [phone]

## 2018-12-23 NOTE — Telephone Encounter (Signed)
See note

## 2018-12-23 NOTE — Telephone Encounter (Signed)
Copied from [place] [phone]. Topic: General - Other >> Dec 23, 2018 12:07 PM Keene Breath wrote: Reason for CRM: Patient called to request that the nurse call her regarding a prescription she needs concerning her mastectomy.  Patient would like for the doctor or nurse to call her back at [phone]

## 2018-12-24 ENCOUNTER — Other Ambulatory Visit (HOSPITAL_COMMUNITY): Payer: Medicare HMO

## 2018-12-24 NOTE — Telephone Encounter (Signed)
Okay prescription.

## 2018-12-26 NOTE — Progress Notes (Signed)
Vanessa Gibson is a 85 y.o. female is here for follow up.  History of Present Illness:   Vanessa Gibson, CMA acting as scribe for Dr. Briscoe Deutscher.   HPI: Patient in office for follow up. She is "just not feeling well" she can tell that she is having a lot of depression. She is trying to get some help with an aid. She is afraid to do tings at home alone. She feels like she is getting to weak. Staying inside. Not interacting with others at ALF. Lonely. Tearful today. Remeron too sedating.   Has not followed up with Endocrinology yet, but feels that blood sugar management going well.  Feet are still swollen, but improved.   Gabapentin made her feel sick - tongue swelling per her report.   Cardiology suggested an ECHO, but she did not go. Worried about cost.   Health Maintenance Due  Topic Date Due  . FOOT EXAM  11/06/2018   Depression screen [facility] 2/9 06/18/2018 08/12/2017 05/18/2017  Decreased Interest _0 Down, Depressed, Hopeless _1 PHQ - 2 Score _2 Altered sleeping _3 Tired, decreased energy 0 3 3  Change in appetite 0 1 2  Feeling bad or failure about yourself  _4 Trouble concentrating _5 Moving slowly or fidgety/restless 2 - 1  Suicidal thoughts 0 0 0  PHQ-9 Score _6 Difficult doing work/chores - Very difficult Very difficult   PMHx, SurgHx, SocialHx, FamHx, Medications, and Allergies were reviewed in the Visit Navigator and updated as appropriate.   Patient Active Problem List   Diagnosis Date Noted  . Bilateral hand numbness 10/01/2018  . Neck pain 10/01/2018  . Bilateral lower extremity edema 06/18/2018  . Primary osteoarthritis of both knees 06/18/2018  . Arthralgia of both hands 06/18/2018  . Chronic cough 06/18/2018  . Hernia of abdominal wall 06/18/2018  . ILD (interstitial lung disease) ([place]) 04/05/2018  . Depression, recurrent ([place]), unhappy in [place] but feels stuck, declines medication or counseling 01/24/2018  . At risk for  polypharmacy, due to vitamin overuse, working with patient to "deprescribe" 01/24/2018  . Constipation, uses Miralax prn 01/24/2018  . Diabetic peripheral neuropathy associated with type 2 diabetes mellitus ([place]), unable to tolerate Neurontin 01/24/2018  . Aortic atherosclerosis ([place]), 2018 CXR 01/24/2018  . Lumbar spine scoliosis 01/24/2018  . Hyperlipidemia associated with type 2 diabetes mellitus ([employer]), on statin 01/24/2018  . IBS (irritable bowel syndrome) 01/24/2018  . Uncontrolled type 2 diabetes mellitus with hyperglycemia, with long-term current use of insulin ([place]) 12/02/2017  . Insomnia 06/06/2017  . CKD stage 3 secondary to diabetes ([place]), on Lisinopril 05/26/2017  . Osteoporosis, post-menopausal 02/06/2016  . Bilateral hearing loss, refuses hearing aids or further testing 02/06/2016  . Arthritis, takes Tumeric  02/06/2016  . Anemia of chronic disease 12/05/2015  . Protein-calorie malnutrition, weight stable 11/13/2015  . Chronic pancreatitis ([place]) 11/13/2015  . GERD (gastroesophageal reflux disease), on Nexium 11/13/2015  . Hypertension associated with diabetes ([place]), on Lisinopril 11/13/2015  . Allergic rhinitis, Rx Xyzal, flonase, atrovent and has been offered referral to Allergy 11/13/2015   Social History   Tobacco Use  . Smoking status: Never Smoker  . Smokeless tobacco: Never Used  Substance Use Topics  . Alcohol use: No  . Drug use: No   Current Medications and Allergies   .  ACCU-CHEK GUIDE test strip, CHECK BLOOD SUGAR THREE TO FOUR TIMES  DAILY, Disp: 400 each, Rfl: 2 .  acetaminophen (TYLENOL) 325 MG tablet, Take 2 tablets (650 mg total) by mouth every 6 (six) hours as needed for moderate pain or headache., Disp: , Rfl:  .  Alcohol Swabs (B-D SINGLE USE SWABS REGULAR) PADS, USE TO CLEAN FINGER BEFORE CHECKING BLOOD SUGARS AND CLEAN SKIN BEFORE INSULIN INJECTIONS., Disp: 300 each, Rfl: 2 .  Ascorbic Acid (VITAMIN C) 1000 MG tablet, Take 1,000 mg by mouth daily.,  Disp: , Rfl:  .  aspirin EC 81 MG tablet, Take 81 mg by mouth daily., Disp: , Rfl:  .  BD PEN NEEDLE NANO U/F 32G X 4 MM MISC, USE TO INJECT INSULINS 4 TIMES DAILY., Disp: 200 each, Rfl: 0 .  Blood Glucose Monitoring Suppl (ACCU-CHEK GUIDE) w/Device KIT, USE AS DIRECTED, Disp: 1 kit, Rfl: 1 .  fluticasone (FLONASE) 50 MCG/ACT nasal spray, Place 2 sprays into both nostrils daily., Disp: 16 g, Rfl: 0 .  furosemide (LASIX) 20 MG tablet, TAKE 1 TABLET ONCE DAILY., Disp: 30 tablet, Rfl: 0 .  gabapentin (NEURONTIN) 300 MG capsule, One po tid, Disp: 90 capsule, Rfl: 5 .  insulin aspart (NOVOLOG FLEXPEN) 100 UNIT/ML FlexPen, Inject 3 units before breakfast and 4 units before supper (Patient taking differently: Inject 6 units before breakfast and 8 units before supper), Disp: 5 pen, Rfl: 4 .  insulin degludec (TRESIBA FLEXTOUCH) 100 UNIT/ML SOPN FlexTouch Pen, Inject 20 units into the skin once daily, Disp: 15 mL, Rfl: 1 .  isosorbide mononitrate (IMDUR) 30 MG 24 hr tablet, TAKE 1 TABLET EVERY DAY, Disp: 90 tablet, Rfl: 0 .  lovastatin (MEVACOR) 20 MG tablet, Take 1 tablet (20 mg total) by mouth at bedtime., Disp: 90 tablet, Rfl: 3 .  metoprolol tartrate (LOPRESSOR) 25 MG tablet, TAKE 1 TABLET TWICE DAILY, Disp: 180 tablet, Rfl: 2 .  mirtazapine (REMERON) 7.5 MG tablet, Take 1 tablet (7.5 mg total) by mouth at bedtime., Disp: 30 tablet, Rfl: 0 .  montelukast (SINGULAIR) 10 MG tablet, TAKE ONE TABLET AT BEDTIME., Disp: 30 tablet, Rfl: 6 .  Multiple Vitamins-Minerals (CENTRUM SILVER 50+WOMEN) TABS, Take 1 tablet by mouth daily., Disp: , Rfl:  .  Multiple Vitamins-Minerals (HAIR/SKIN/NAILS/BIOTIN PO), Take 1 tablet by mouth daily., Disp: , Rfl:  .  multivitamin-lutein (OCUVITE-LUTEIN) CAPS capsule, Take 1 capsule by mouth daily., Disp: , Rfl:  .  omeprazole (PRILOSEC) 40 MG capsule, TAKE 1 CAPSULE EVERY DAY., Disp: 90 capsule, Rfl: 1 .  potassium chloride (K-DUR) 10 MEQ tablet, TAKE 1 TABLET EACH DAY., Disp:  30 tablet, Rfl: 3 .  potassium chloride (K-DUR,KLOR-CON) 10 MEQ tablet, Take 10 mEq by mouth daily., Disp: , Rfl:  .  TRUEPLUS LANCETS 28G MISC, CHECK BLOOD SUGAR 3 TO 4 TIMES DAILY., Disp: 100 each, Rfl: 0  No Known Allergies   Review of Systems   Pertinent items are noted in the HPI. Otherwise, a complete ROS is negative.  Vitals   Vitals:   12/27/18 1254  BP: 136/78  Pulse: 79  Temp: 98.6 F (37 C)  TempSrc: Oral  SpO2: 96%  Weight: 125 lb 3.2 oz (56.8 kg)  Height: _0  (1.499 m)     Body mass index is 25.29 kg/m.  Physical Exam   Physical Exam Vitals signs and nursing note reviewed.  Constitutional:      General: She is not in acute distress.    Appearance: Normal appearance.  HENT:     Head: Normocephalic and atraumatic.  Right Ear: External ear normal.     Left Ear: External ear normal.     Nose: Nose normal.     Mouth/Throat:     Mouth: Mucous membranes are moist.  Eyes:     Extraocular Movements: Extraocular movements intact.     Conjunctiva/sclera: Conjunctivae normal.     Pupils: Pupils are equal, round, and reactive to light.  Neck:     Musculoskeletal: Normal range of motion and neck supple.  Cardiovascular:     Rate and Rhythm: Normal rate and regular rhythm.  Pulmonary:     Effort: Pulmonary effort is normal.  Abdominal:     Palpations: Abdomen is soft.  Musculoskeletal:     Right lower leg: Edema present.     Left lower leg: Edema present.  Skin:    General: Skin is warm.     Capillary Refill: Capillary refill takes less than 2 seconds.  Neurological:     General: No focal deficit present.     Mental Status: She is alert.  Psychiatric:        Mood and Affect: Affect is tearful.        Behavior: Behavior normal.    Results for orders placed or performed in visit on 12/27/18  CBC with Differential/Platelet  Result Value Ref Range   WBC 7.7 4.0 - 10.5 K/uL   RBC 3.76 (L) 3.87 - 5.11 Mil/uL   Hemoglobin 12.1 12.0 - 15.0 g/dL    HCT 36.0 36.0 - 46.0 %   MCV 95.6 78.0 - 100.0 fl   MCHC 33.6 30.0 - 36.0 g/dL   RDW 12.8 11.5 - 15.5 %   Platelets 247.0 150.0 - 400.0 K/uL   Neutrophils Relative % 49.5 43.0 - 77.0 %   Lymphocytes Relative 36.3 12.0 - 46.0 %   Monocytes Relative 10.3 3.0 - 12.0 %   Eosinophils Relative 3.2 0.0 - 5.0 %   Basophils Relative 0.7 0.0 - 3.0 %   Neutro Abs 3.8 1.4 - 7.7 K/uL   Lymphs Abs 2.8 0.7 - 4.0 K/uL   Monocytes Absolute 0.8 0.1 - 1.0 K/uL   Eosinophils Absolute 0.2 0.0 - 0.7 K/uL   Basophils Absolute 0.1 0.0 - 0.1 K/uL  Comprehensive metabolic panel  Result Value Ref Range   Sodium 137 135 - 145 mEq/L   Potassium 4.8 3.5 - 5.1 mEq/L   Chloride 101 96 - 112 mEq/L   CO2 29 19 - 32 mEq/L   Glucose, Bld 170 (H) 70 - 99 mg/dL   BUN 26 (H) 6 - 23 mg/dL   Creatinine, Ser 1.45 (H) 0.40 - 1.20 mg/dL   Total Bilirubin 0.4 0.2 - 1.2 mg/dL   Alkaline Phosphatase 54 39 - 117 U/L   AST 26 0 - 37 U/L   ALT 21 0 - 35 U/L   Total Protein 6.7 6.0 - 8.3 g/dL   Albumin 4.2 3.5 - 5.2 g/dL   Calcium 9.7 8.4 - 10.5 mg/dL   GFR 34.07 (L) >60.00 mL/min  Hemoglobin A1c  Result Value Ref Range   Hgb A1c MFr Bld 7.6 (H) 4.6 - 6.5 %   Assessment and Plan   Vanessa Gibson was seen today for follow-up.  Diagnoses and all orders for this visit:  Diabetic peripheral neuropathy associated with type 2 diabetes mellitus ([place]) Comments: Not tolerating many medications 2/2 sedation. Seeing Neurologist.  Orders: -     CBC with Differential/Platelet -     Comprehensive metabolic panel -  Hemoglobin A1c  Dizziness Comments: Unsafe walking with balance issues. HH PT order.  Orders: -     Ambulatory referral to [place]  History of mastectomy Comments: Bra Rx today. Orders: -     DME Other see comment  Depression, recurrent ([place]), unhappy in [place] but feels stuck, declines medication or counseling Comments: Worsening. Discussed potential treatments. Wants to work on sleep. Would benefit from  counseling.   Diabetes mellitus type 2 with complications (H[facility]) Comments: Followed by Endocrinology. Doing well.   Insomnia Comments: Discussed benefits v risks of medications. Previously on Lorazepam for years, did well. We tried to eliminate benzos from list, but will benefit potential high. Orders: -     LORazepam (ATIVAN) 0.5 MG tablet; Take 1 tablet (0.5 mg total) by mouth at bedtime.   . Orders and follow up as documented in [place], reviewed diet, exercise and weight control, cardiovascular risk and specific lipid/LDL goals reviewed, reviewed medications and side effects in detail.  . Reviewed expectations re: course of current medical issues. . Outlined signs and symptoms indicating need for more acute intervention. . Patient verbalized understanding and all questions were answered. . Patient received an After Visit Summary.  CMA served as Education administrator during this visit. History, Physical, and Plan performed by medical provider. The above documentation has been reviewed and is accurate and complete. Briscoe Deutscher, D.O.  Briscoe Deutscher, DO [place], [facility] 01/02/2019

## 2018-12-27 ENCOUNTER — Ambulatory Visit (INDEPENDENT_AMBULATORY_CARE_PROVIDER_SITE_OTHER): Payer: Medicare HMO | Admitting: Family Medicine

## 2018-12-27 ENCOUNTER — Encounter: Payer: Self-pay | Admitting: Family Medicine

## 2018-12-27 ENCOUNTER — Encounter

## 2018-12-27 VITALS — BP 136/78 | HR 79 | Temp 98.6°F | Ht 59.0 in | Wt 125.2 lb

## 2018-12-27 DIAGNOSIS — G4700 Insomnia, unspecified: Secondary | ICD-10-CM | POA: Diagnosis not present

## 2018-12-27 DIAGNOSIS — E118 Type 2 diabetes mellitus with unspecified complications: Secondary | ICD-10-CM

## 2018-12-27 DIAGNOSIS — E1142 Type 2 diabetes mellitus with diabetic polyneuropathy: Secondary | ICD-10-CM | POA: Diagnosis not present

## 2018-12-27 DIAGNOSIS — Z9010 Acquired absence of unspecified breast and nipple: Secondary | ICD-10-CM | POA: Diagnosis not present

## 2018-12-27 DIAGNOSIS — R42 Dizziness and giddiness: Secondary | ICD-10-CM

## 2018-12-27 DIAGNOSIS — F339 Major depressive disorder, recurrent, unspecified: Secondary | ICD-10-CM | POA: Diagnosis not present

## 2018-12-27 LAB — CBC WITH DIFFERENTIAL/PLATELET
Basophils Absolute: 0.1 10*3/uL (ref 0.0–0.1)
Basophils Relative: 0.7 % (ref 0.0–3.0)
Eosinophils Absolute: 0.2 10*3/uL (ref 0.0–0.7)
Eosinophils Relative: 3.2 % (ref 0.0–5.0)
HCT: 36 % (ref 36.0–46.0)
Hemoglobin: 12.1 g/dL (ref 12.0–15.0)
Lymphocytes Relative: 36.3 % (ref 12.0–46.0)
Lymphs Abs: 2.8 10*3/uL (ref 0.7–4.0)
MCHC: 33.6 g/dL (ref 30.0–36.0)
MCV: 95.6 fl (ref 78.0–100.0)
Monocytes Absolute: 0.8 10*3/uL (ref 0.1–1.0)
Monocytes Relative: 10.3 % (ref 3.0–12.0)
Neutro Abs: 3.8 10*3/uL (ref 1.4–7.7)
Neutrophils Relative %: 49.5 % (ref 43.0–77.0)
Platelets: 247 10*3/uL (ref 150.0–400.0)
RBC: 3.76 Mil/uL — ABNORMAL LOW (ref 3.87–5.11)
RDW: 12.8 % (ref 11.5–15.5)
WBC: 7.7 10*3/uL (ref 4.0–10.5)

## 2018-12-27 LAB — COMPREHENSIVE METABOLIC PANEL
ALT: 21 U/L (ref 0–35)
AST: 26 U/L (ref 0–37)
Albumin: 4.2 g/dL (ref 3.5–5.2)
Alkaline Phosphatase: 54 U/L (ref 39–117)
BUN: 26 mg/dL — ABNORMAL HIGH (ref 6–23)
CO2: 29 mEq/L (ref 19–32)
Calcium: 9.7 mg/dL (ref 8.4–10.5)
Chloride: 101 mEq/L (ref 96–112)
Creatinine, Ser: 1.45 mg/dL — ABNORMAL HIGH (ref 0.40–1.20)
GFR: 34.07 mL/min — ABNORMAL LOW (ref >60.00)
Glucose, Bld: 170 mg/dL — ABNORMAL HIGH (ref 70–99)
Potassium: 4.8 mEq/L (ref 3.5–5.1)
Sodium: 137 mEq/L (ref 135–145)
Total Bilirubin: 0.4 mg/dL (ref 0.2–1.2)
Total Protein: 6.7 g/dL (ref 6.0–8.3)

## 2018-12-27 LAB — HEMOGLOBIN A1C: Hgb A1c MFr Bld: 7.6 % — ABNORMAL HIGH (ref 4.6–6.5)

## 2018-12-27 NOTE — Patient Instructions (Signed)
Stop the Gabapentin Stop the Remeron We have called in a new prescription for Lorazepam take one at night.  I have started a new order for Home health. They should be calling you soon.

## 2018-12-28 MED ORDER — LORAZEPAM 0.5 MG PO TABS: 0.5000 mg | ORAL_TABLET | Freq: Every day | ORAL | 1 refills | Status: DC

## 2018-12-29 ENCOUNTER — Telehealth: Payer: Self-pay | Admitting: Family Medicine

## 2018-12-29 DIAGNOSIS — M810 Age-related osteoporosis without current pathological fracture: Secondary | ICD-10-CM | POA: Diagnosis not present

## 2018-12-29 DIAGNOSIS — Z794 Long term (current) use of insulin: Secondary | ICD-10-CM | POA: Diagnosis not present

## 2018-12-29 DIAGNOSIS — N183 Chronic kidney disease, stage 3 (moderate): Secondary | ICD-10-CM | POA: Diagnosis not present

## 2018-12-29 DIAGNOSIS — R2689 Other abnormalities of gait and mobility: Secondary | ICD-10-CM | POA: Diagnosis not present

## 2018-12-29 DIAGNOSIS — M170 Bilateral primary osteoarthritis of knee: Secondary | ICD-10-CM | POA: Diagnosis not present

## 2018-12-29 DIAGNOSIS — F329 Major depressive disorder, single episode, unspecified: Secondary | ICD-10-CM | POA: Diagnosis not present

## 2018-12-29 DIAGNOSIS — I129 Hypertensive chronic kidney disease with stage 1 through stage 4 chronic kidney disease, or unspecified chronic kidney disease: Secondary | ICD-10-CM | POA: Diagnosis not present

## 2018-12-29 DIAGNOSIS — E1152 Type 2 diabetes mellitus with diabetic peripheral angiopathy with gangrene: Secondary | ICD-10-CM | POA: Diagnosis not present

## 2018-12-29 DIAGNOSIS — E1122 Type 2 diabetes mellitus with diabetic chronic kidney disease: Secondary | ICD-10-CM | POA: Diagnosis not present

## 2018-12-29 NOTE — Telephone Encounter (Signed)
See note

## 2018-12-29 NOTE — Telephone Encounter (Unsigned)
Copied from [place] [phone]. Topic: Quick Communication - Home Health Verbal Orders >> Dec 29, 2018 10:44 AM Yvette Rack wrote: Caller/Agency: Dorian Pod with [facility]  Callback Number: [phone] Requesting OT/PT/Skilled Nursing/Social Work: Skilled nursing  Frequency: 2 times a week for 4 weeks and 1 time a week for 4 weeks for medication management

## 2018-12-31 ENCOUNTER — Telehealth: Payer: Self-pay | Admitting: Family Medicine

## 2018-12-31 NOTE — Telephone Encounter (Signed)
Has been addressed in other message.

## 2018-12-31 NOTE — Telephone Encounter (Signed)
See note

## 2018-12-31 NOTE — Telephone Encounter (Signed)
Copied from [place] [phone]. Topic: Quick Communication - Home Health Verbal Orders >> Dec 31, 2018  8:43 AM Nils Flack wrote: Caller/Agency: Laurey Arrow, [facility] Callback Number: [phone] Requesting OT/PT/Skilled Nursing/Social Work:  Frequency: pt was to be evaluated today, pt refused visit until next week.  Laurey Arrow will be sending in order to see her next week.

## 2018-12-31 NOTE — Telephone Encounter (Signed)
fYI  

## 2019-01-01 DIAGNOSIS — N183 Chronic kidney disease, stage 3 (moderate): Secondary | ICD-10-CM | POA: Diagnosis not present

## 2019-01-01 DIAGNOSIS — F329 Major depressive disorder, single episode, unspecified: Secondary | ICD-10-CM | POA: Diagnosis not present

## 2019-01-01 DIAGNOSIS — Z794 Long term (current) use of insulin: Secondary | ICD-10-CM | POA: Diagnosis not present

## 2019-01-01 DIAGNOSIS — R2689 Other abnormalities of gait and mobility: Secondary | ICD-10-CM | POA: Diagnosis not present

## 2019-01-01 DIAGNOSIS — I129 Hypertensive chronic kidney disease with stage 1 through stage 4 chronic kidney disease, or unspecified chronic kidney disease: Secondary | ICD-10-CM | POA: Diagnosis not present

## 2019-01-01 DIAGNOSIS — M170 Bilateral primary osteoarthritis of knee: Secondary | ICD-10-CM | POA: Diagnosis not present

## 2019-01-01 DIAGNOSIS — E1152 Type 2 diabetes mellitus with diabetic peripheral angiopathy with gangrene: Secondary | ICD-10-CM | POA: Diagnosis not present

## 2019-01-01 DIAGNOSIS — E1122 Type 2 diabetes mellitus with diabetic chronic kidney disease: Secondary | ICD-10-CM | POA: Diagnosis not present

## 2019-01-01 DIAGNOSIS — M810 Age-related osteoporosis without current pathological fracture: Secondary | ICD-10-CM | POA: Diagnosis not present

## 2019-01-02 ENCOUNTER — Encounter: Payer: Self-pay | Admitting: Family Medicine

## 2019-01-03 ENCOUNTER — Other Ambulatory Visit: Payer: Self-pay | Admitting: Endocrinology

## 2019-01-03 ENCOUNTER — Other Ambulatory Visit: Payer: Self-pay | Admitting: Family Medicine

## 2019-01-03 ENCOUNTER — Other Ambulatory Visit: Payer: Self-pay

## 2019-01-03 MED ORDER — TRUEPLUS LANCETS 28G MISC: 1.0000 | Freq: Four times a day (QID) | 0 refills | Status: DC

## 2019-01-04 ENCOUNTER — Other Ambulatory Visit: Payer: Self-pay | Admitting: Endocrinology

## 2019-01-04 ENCOUNTER — Telehealth: Payer: Self-pay | Admitting: Family Medicine

## 2019-01-04 DIAGNOSIS — M810 Age-related osteoporosis without current pathological fracture: Secondary | ICD-10-CM | POA: Diagnosis not present

## 2019-01-04 DIAGNOSIS — E1122 Type 2 diabetes mellitus with diabetic chronic kidney disease: Secondary | ICD-10-CM | POA: Diagnosis not present

## 2019-01-04 DIAGNOSIS — R2689 Other abnormalities of gait and mobility: Secondary | ICD-10-CM | POA: Diagnosis not present

## 2019-01-04 DIAGNOSIS — N183 Chronic kidney disease, stage 3 (moderate): Secondary | ICD-10-CM | POA: Diagnosis not present

## 2019-01-04 DIAGNOSIS — E1152 Type 2 diabetes mellitus with diabetic peripheral angiopathy with gangrene: Secondary | ICD-10-CM | POA: Diagnosis not present

## 2019-01-04 DIAGNOSIS — I129 Hypertensive chronic kidney disease with stage 1 through stage 4 chronic kidney disease, or unspecified chronic kidney disease: Secondary | ICD-10-CM | POA: Diagnosis not present

## 2019-01-04 DIAGNOSIS — M170 Bilateral primary osteoarthritis of knee: Secondary | ICD-10-CM | POA: Diagnosis not present

## 2019-01-04 DIAGNOSIS — Z794 Long term (current) use of insulin: Secondary | ICD-10-CM | POA: Diagnosis not present

## 2019-01-04 DIAGNOSIS — F329 Major depressive disorder, single episode, unspecified: Secondary | ICD-10-CM | POA: Diagnosis not present

## 2019-01-04 NOTE — Telephone Encounter (Signed)
Copied from [place] [id number]. Topic: Quick Communication - Home Health Verbal Orders >> Jan 04, 2019 11:12 AM Rutherford Nail, NT wrote: Caller/AgencyConstance Haw - Physical therapist with [facility] [place] Number: [phone] Requesting OT/PT/Skilled Nursing/Social Work: Physical Therapy Frequency:  2x a week for 2 weeks 1x a week for 2 weeks

## 2019-01-04 NOTE — Telephone Encounter (Signed)
Called and given verbal orders

## 2019-01-04 NOTE — Telephone Encounter (Signed)
See note

## 2019-01-05 ENCOUNTER — Telehealth: Payer: Self-pay | Admitting: Family Medicine

## 2019-01-05 DIAGNOSIS — F329 Major depressive disorder, single episode, unspecified: Secondary | ICD-10-CM | POA: Diagnosis not present

## 2019-01-05 DIAGNOSIS — M170 Bilateral primary osteoarthritis of knee: Secondary | ICD-10-CM | POA: Diagnosis not present

## 2019-01-05 DIAGNOSIS — E1152 Type 2 diabetes mellitus with diabetic peripheral angiopathy with gangrene: Secondary | ICD-10-CM | POA: Diagnosis not present

## 2019-01-05 DIAGNOSIS — N183 Chronic kidney disease, stage 3 (moderate): Secondary | ICD-10-CM | POA: Diagnosis not present

## 2019-01-05 DIAGNOSIS — I129 Hypertensive chronic kidney disease with stage 1 through stage 4 chronic kidney disease, or unspecified chronic kidney disease: Secondary | ICD-10-CM | POA: Diagnosis not present

## 2019-01-05 DIAGNOSIS — M810 Age-related osteoporosis without current pathological fracture: Secondary | ICD-10-CM | POA: Diagnosis not present

## 2019-01-05 DIAGNOSIS — Z794 Long term (current) use of insulin: Secondary | ICD-10-CM | POA: Diagnosis not present

## 2019-01-05 DIAGNOSIS — E1122 Type 2 diabetes mellitus with diabetic chronic kidney disease: Secondary | ICD-10-CM | POA: Diagnosis not present

## 2019-01-05 DIAGNOSIS — R2689 Other abnormalities of gait and mobility: Secondary | ICD-10-CM | POA: Diagnosis not present

## 2019-01-05 NOTE — Telephone Encounter (Signed)
Can you call patient for o/v

## 2019-01-05 NOTE — Telephone Encounter (Signed)
Called pt to schedule OV and she stated she had just seen Dr. Juleen China and had too many appts already scheduled to come back in for OV. Pt stated she is going out of town next month and needs the medication. I informed pt that I would let Dr. Juleen China know and have someone give her a call in reference to this.

## 2019-01-05 NOTE — Telephone Encounter (Signed)
fYI  

## 2019-01-05 NOTE — Telephone Encounter (Signed)
Copied from [place] [id number]. Topic: Quick Communication - See Telephone Encounter >> Jan 05, 2019 10:52 AM Antonieta Iba C wrote: CRM for notification. See Telephone encounter for: 01/05/19.  Millie - Social Worker with [facility] [phone]  [place] would like to know if PCP would add a anti-depressant to pt's medication. Millie says that pt is endorsing several depression symptoms. ALSO, she is requesting VO for 2 additional follow up visits for social work to help pt with resources.  Please advise.

## 2019-01-06 DIAGNOSIS — C50912 Malignant neoplasm of unspecified site of left female breast: Secondary | ICD-10-CM | POA: Diagnosis not present

## 2019-01-06 DIAGNOSIS — C50911 Malignant neoplasm of unspecified site of right female breast: Secondary | ICD-10-CM | POA: Diagnosis not present

## 2019-01-06 MED ORDER — CITALOPRAM HYDROBROMIDE 10 MG PO TABS: 10.0000 mg | ORAL_TABLET | Freq: Every day | ORAL | 1 refills | Status: DC

## 2019-01-06 NOTE — Telephone Encounter (Signed)
Called patient let her know all information. She will call office back later and get she is out shopping right and does not want to worry with it.

## 2019-01-06 NOTE — Telephone Encounter (Signed)
Pt returning Joellen's call.  States she is very confused about what is going on.  Pt states she is only available until 4pm today (01/06/2019)

## 2019-01-06 NOTE — Telephone Encounter (Signed)
See note

## 2019-01-06 NOTE — Telephone Encounter (Signed)
Discussed at last visit. I sent in Celexa. Start with 1/2 dose at night. Okay to increase after 2 weeks Offer therapy as well.

## 2019-01-10 DIAGNOSIS — F329 Major depressive disorder, single episode, unspecified: Secondary | ICD-10-CM

## 2019-01-10 DIAGNOSIS — R2689 Other abnormalities of gait and mobility: Secondary | ICD-10-CM

## 2019-01-10 DIAGNOSIS — N183 Chronic kidney disease, stage 3 (moderate): Secondary | ICD-10-CM

## 2019-01-10 DIAGNOSIS — M810 Age-related osteoporosis without current pathological fracture: Secondary | ICD-10-CM

## 2019-01-10 DIAGNOSIS — Z794 Long term (current) use of insulin: Secondary | ICD-10-CM | POA: Diagnosis not present

## 2019-01-10 DIAGNOSIS — E1152 Type 2 diabetes mellitus with diabetic peripheral angiopathy with gangrene: Secondary | ICD-10-CM | POA: Diagnosis not present

## 2019-01-10 DIAGNOSIS — M170 Bilateral primary osteoarthritis of knee: Secondary | ICD-10-CM

## 2019-01-10 DIAGNOSIS — I129 Hypertensive chronic kidney disease with stage 1 through stage 4 chronic kidney disease, or unspecified chronic kidney disease: Secondary | ICD-10-CM | POA: Diagnosis not present

## 2019-01-10 DIAGNOSIS — E1122 Type 2 diabetes mellitus with diabetic chronic kidney disease: Secondary | ICD-10-CM | POA: Diagnosis not present

## 2019-01-11 NOTE — Telephone Encounter (Signed)
Left message to return call to our office.  

## 2019-01-12 DIAGNOSIS — F329 Major depressive disorder, single episode, unspecified: Secondary | ICD-10-CM | POA: Diagnosis not present

## 2019-01-12 DIAGNOSIS — R2689 Other abnormalities of gait and mobility: Secondary | ICD-10-CM | POA: Diagnosis not present

## 2019-01-12 DIAGNOSIS — M170 Bilateral primary osteoarthritis of knee: Secondary | ICD-10-CM | POA: Diagnosis not present

## 2019-01-12 DIAGNOSIS — Z794 Long term (current) use of insulin: Secondary | ICD-10-CM | POA: Diagnosis not present

## 2019-01-12 DIAGNOSIS — E1122 Type 2 diabetes mellitus with diabetic chronic kidney disease: Secondary | ICD-10-CM | POA: Diagnosis not present

## 2019-01-12 DIAGNOSIS — E1152 Type 2 diabetes mellitus with diabetic peripheral angiopathy with gangrene: Secondary | ICD-10-CM | POA: Diagnosis not present

## 2019-01-12 DIAGNOSIS — M810 Age-related osteoporosis without current pathological fracture: Secondary | ICD-10-CM | POA: Diagnosis not present

## 2019-01-12 DIAGNOSIS — I129 Hypertensive chronic kidney disease with stage 1 through stage 4 chronic kidney disease, or unspecified chronic kidney disease: Secondary | ICD-10-CM | POA: Diagnosis not present

## 2019-01-12 DIAGNOSIS — N183 Chronic kidney disease, stage 3 (moderate): Secondary | ICD-10-CM | POA: Diagnosis not present

## 2019-01-14 ENCOUNTER — Ambulatory Visit: Payer: Self-pay | Admitting: Family Medicine

## 2019-01-14 ENCOUNTER — Ambulatory Visit: Payer: Medicare HMO | Admitting: Cardiology

## 2019-01-14 ENCOUNTER — Encounter

## 2019-01-14 NOTE — Telephone Encounter (Signed)
Added to another phone note that was opened

## 2019-01-14 NOTE — Telephone Encounter (Signed)
Spoke to patient reviewed medication she has been taking Celexa whole tab with no issues and would like to continue that. I reviewed instructions from last visit that were printed and highlighted on her AVS. States that she was not aware that she was to d/c the gabapentin. She will stop that now and let us know if any problems. She feels like tthe Lorazepam is helping a lot at night and will continue that.

## 2019-01-14 NOTE — Telephone Encounter (Signed)
Also need to let patient know following  Discussed at last visit. I sent in Celexa. Start with 1/2 dose at night. Okay to increase after 2 weeks Offer therapy as well  Called patient l/m to call office.

## 2019-01-14 NOTE — Telephone Encounter (Signed)
Per last office note  Stop the Gabapentin Stop the Remeron We have called in a new prescription for Lorazepam take one at night.  I have started a new order for Home health. They should be calling you soon.

## 2019-01-14 NOTE — Telephone Encounter (Signed)
Incoming call from  Patient who is  Concerned  About  Taking to many  Pills Patient  States  Dr.  Juleen China  Rx.  100mg  of  Gabapentin  One time.  Then  She  Went  To  Dr.  Serita Grit who is a  Neurologist. He  Increased the  Dosage of Gabepentin to  300mg  3tid. Patient feels  This  Is to much for  Her.  Patient  dosent  Even  Want to  Take  The  100mg  that Dr.  Juleen China  Prescribed.  Neither  Medication  And  Dosage  Is  On  The  Medlist.  Patient  dosent  Like the way  The  Medication  Makes  Her  Feel.  Dosent  Wish  To     Continue the  Medication.  Again  Gabapentin  No t on  Medlist.  Patstates  She could not  See. Medication  To  Strong.  Patient  Also  Had  An  Issue  with  Blood sugar  This  Am.  It  Was  108 .  Ate  Something rechecked went  Up to  86.  Feels better. Patient  Would  Like  To discuss with. Juleen China.  Awaits  Call from  Dr.  Juleen China.

## 2019-01-14 NOTE — Telephone Encounter (Signed)
See note

## 2019-01-17 DIAGNOSIS — M810 Age-related osteoporosis without current pathological fracture: Secondary | ICD-10-CM | POA: Diagnosis not present

## 2019-01-17 DIAGNOSIS — M170 Bilateral primary osteoarthritis of knee: Secondary | ICD-10-CM | POA: Diagnosis not present

## 2019-01-17 DIAGNOSIS — F329 Major depressive disorder, single episode, unspecified: Secondary | ICD-10-CM | POA: Diagnosis not present

## 2019-01-17 DIAGNOSIS — Z794 Long term (current) use of insulin: Secondary | ICD-10-CM | POA: Diagnosis not present

## 2019-01-17 DIAGNOSIS — I129 Hypertensive chronic kidney disease with stage 1 through stage 4 chronic kidney disease, or unspecified chronic kidney disease: Secondary | ICD-10-CM | POA: Diagnosis not present

## 2019-01-17 DIAGNOSIS — R2689 Other abnormalities of gait and mobility: Secondary | ICD-10-CM | POA: Diagnosis not present

## 2019-01-17 DIAGNOSIS — E1152 Type 2 diabetes mellitus with diabetic peripheral angiopathy with gangrene: Secondary | ICD-10-CM | POA: Diagnosis not present

## 2019-01-17 DIAGNOSIS — E1122 Type 2 diabetes mellitus with diabetic chronic kidney disease: Secondary | ICD-10-CM | POA: Diagnosis not present

## 2019-01-17 DIAGNOSIS — N183 Chronic kidney disease, stage 3 (moderate): Secondary | ICD-10-CM | POA: Diagnosis not present

## 2019-01-19 DIAGNOSIS — E1122 Type 2 diabetes mellitus with diabetic chronic kidney disease: Secondary | ICD-10-CM | POA: Diagnosis not present

## 2019-01-19 DIAGNOSIS — M170 Bilateral primary osteoarthritis of knee: Secondary | ICD-10-CM | POA: Diagnosis not present

## 2019-01-19 DIAGNOSIS — F329 Major depressive disorder, single episode, unspecified: Secondary | ICD-10-CM | POA: Diagnosis not present

## 2019-01-19 DIAGNOSIS — Z794 Long term (current) use of insulin: Secondary | ICD-10-CM | POA: Diagnosis not present

## 2019-01-19 DIAGNOSIS — N183 Chronic kidney disease, stage 3 (moderate): Secondary | ICD-10-CM | POA: Diagnosis not present

## 2019-01-19 DIAGNOSIS — R2689 Other abnormalities of gait and mobility: Secondary | ICD-10-CM | POA: Diagnosis not present

## 2019-01-19 DIAGNOSIS — M810 Age-related osteoporosis without current pathological fracture: Secondary | ICD-10-CM | POA: Diagnosis not present

## 2019-01-19 DIAGNOSIS — I129 Hypertensive chronic kidney disease with stage 1 through stage 4 chronic kidney disease, or unspecified chronic kidney disease: Secondary | ICD-10-CM | POA: Diagnosis not present

## 2019-01-19 DIAGNOSIS — E1152 Type 2 diabetes mellitus with diabetic peripheral angiopathy with gangrene: Secondary | ICD-10-CM | POA: Diagnosis not present

## 2019-01-20 ENCOUNTER — Other Ambulatory Visit: Payer: Self-pay | Admitting: Physician Assistant

## 2019-01-24 ENCOUNTER — Ambulatory Visit: Payer: Medicare HMO | Admitting: Family Medicine

## 2019-01-25 DIAGNOSIS — M170 Bilateral primary osteoarthritis of knee: Secondary | ICD-10-CM | POA: Diagnosis not present

## 2019-01-25 DIAGNOSIS — E1152 Type 2 diabetes mellitus with diabetic peripheral angiopathy with gangrene: Secondary | ICD-10-CM | POA: Diagnosis not present

## 2019-01-25 DIAGNOSIS — N183 Chronic kidney disease, stage 3 (moderate): Secondary | ICD-10-CM | POA: Diagnosis not present

## 2019-01-25 DIAGNOSIS — E1122 Type 2 diabetes mellitus with diabetic chronic kidney disease: Secondary | ICD-10-CM | POA: Diagnosis not present

## 2019-01-25 DIAGNOSIS — F329 Major depressive disorder, single episode, unspecified: Secondary | ICD-10-CM | POA: Diagnosis not present

## 2019-01-25 DIAGNOSIS — M810 Age-related osteoporosis without current pathological fracture: Secondary | ICD-10-CM | POA: Diagnosis not present

## 2019-01-25 DIAGNOSIS — I129 Hypertensive chronic kidney disease with stage 1 through stage 4 chronic kidney disease, or unspecified chronic kidney disease: Secondary | ICD-10-CM | POA: Diagnosis not present

## 2019-01-25 DIAGNOSIS — R2689 Other abnormalities of gait and mobility: Secondary | ICD-10-CM | POA: Diagnosis not present

## 2019-01-25 DIAGNOSIS — Z794 Long term (current) use of insulin: Secondary | ICD-10-CM | POA: Diagnosis not present

## 2019-01-26 ENCOUNTER — Other Ambulatory Visit: Payer: Self-pay | Admitting: Endocrinology

## 2019-01-26 ENCOUNTER — Other Ambulatory Visit: Payer: Self-pay | Admitting: Family Medicine

## 2019-01-26 DIAGNOSIS — Z794 Long term (current) use of insulin: Secondary | ICD-10-CM | POA: Diagnosis not present

## 2019-01-26 DIAGNOSIS — E1122 Type 2 diabetes mellitus with diabetic chronic kidney disease: Secondary | ICD-10-CM | POA: Diagnosis not present

## 2019-01-26 DIAGNOSIS — M810 Age-related osteoporosis without current pathological fracture: Secondary | ICD-10-CM | POA: Diagnosis not present

## 2019-01-26 DIAGNOSIS — N183 Chronic kidney disease, stage 3 (moderate): Secondary | ICD-10-CM | POA: Diagnosis not present

## 2019-01-26 DIAGNOSIS — R2689 Other abnormalities of gait and mobility: Secondary | ICD-10-CM | POA: Diagnosis not present

## 2019-01-26 DIAGNOSIS — M170 Bilateral primary osteoarthritis of knee: Secondary | ICD-10-CM | POA: Diagnosis not present

## 2019-01-26 DIAGNOSIS — R059 Cough, unspecified: Secondary | ICD-10-CM

## 2019-01-26 DIAGNOSIS — F329 Major depressive disorder, single episode, unspecified: Secondary | ICD-10-CM | POA: Diagnosis not present

## 2019-01-26 DIAGNOSIS — E1152 Type 2 diabetes mellitus with diabetic peripheral angiopathy with gangrene: Secondary | ICD-10-CM | POA: Diagnosis not present

## 2019-01-26 DIAGNOSIS — I129 Hypertensive chronic kidney disease with stage 1 through stage 4 chronic kidney disease, or unspecified chronic kidney disease: Secondary | ICD-10-CM | POA: Diagnosis not present

## 2019-01-26 DIAGNOSIS — R05 Cough: Secondary | ICD-10-CM

## 2019-01-28 ENCOUNTER — Telehealth: Payer: Self-pay | Admitting: Endocrinology

## 2019-01-28 ENCOUNTER — Other Ambulatory Visit: Payer: Self-pay

## 2019-01-28 MED ORDER — INSULIN DEGLUDEC 100 UNIT/ML ~~LOC~~ SOPN: PEN_INJECTOR | SUBCUTANEOUS | 1 refills | Status: DC

## 2019-01-28 NOTE — Telephone Encounter (Signed)
Rx sent 

## 2019-01-28 NOTE — Telephone Encounter (Signed)
MEDICATION: Vanessa Gibson  PHARMACY:  [facility]  IS THIS A 90 DAY SUPPLY : Yes  IS PATIENT OUT OF MEDICATION:   IF NOT; HOW MUCH IS LEFT:   LAST APPOINTMENT DATE: @3 /02/2019  NEXT APPOINTMENT DATE:@4 /30/2020  DO WE HAVE YOUR PERMISSION TO LEAVE A DETAILED MESSAGE:  OTHER COMMENTS:  Patient made a follow up appointment and requested to have a small supply of Tresiba to get by until appointment.  **Let patient know to contact pharmacy at the end of the day to make sure medication is ready. **  ** Please notify patient to allow 48-72 hours to process**  **Encourage patient to contact the pharmacy for refills or they can request refills through [facility]**

## 2019-01-31 ENCOUNTER — Ambulatory Visit: Payer: Medicare HMO | Admitting: Family Medicine

## 2019-02-03 DIAGNOSIS — C50911 Malignant neoplasm of unspecified site of right female breast: Secondary | ICD-10-CM | POA: Diagnosis not present

## 2019-02-03 DIAGNOSIS — C50912 Malignant neoplasm of unspecified site of left female breast: Secondary | ICD-10-CM | POA: Diagnosis not present

## 2019-02-09 ENCOUNTER — Other Ambulatory Visit: Payer: Self-pay | Admitting: Family Medicine

## 2019-02-09 DIAGNOSIS — R059 Cough, unspecified: Secondary | ICD-10-CM

## 2019-02-09 DIAGNOSIS — R05 Cough: Secondary | ICD-10-CM

## 2019-02-09 DIAGNOSIS — G4700 Insomnia, unspecified: Secondary | ICD-10-CM

## 2019-02-09 NOTE — Telephone Encounter (Signed)
Ok to fill 

## 2019-02-11 ENCOUNTER — Telehealth: Payer: Self-pay

## 2019-02-11 ENCOUNTER — Telehealth: Payer: Self-pay | Admitting: Endocrinology

## 2019-02-11 NOTE — Telephone Encounter (Signed)
Next time her blood sugar is below 100 she will reduce NovoLog by 2 units.  She will not take any insulin now.  Also need to confirm her follow-up appointment

## 2019-02-11 NOTE — Telephone Encounter (Signed)
Pt and stated that she checked her sugar when she woke up and it was noted to be 81. Pt stated that she read somewhere that she should not take her morning insulin if blood sugar is low. For this reason, she did not take her insulin and ate breakfast at 0900. Pt now states that her blood sugar is 241. Pt would like to know if she should take some insulin. 

## 2019-02-11 NOTE — Telephone Encounter (Signed)
Please clarify: Take 2 units of Novolog if blood sugar is below 100??  Current blood sugar as of the time of the call was 241.

## 2019-02-11 NOTE — Telephone Encounter (Signed)
She can take 2 units less NovoLog if her blood sugar is below 100.  Need to know if she is planning to come back for follow-up otherwise she will be considered self dismissed

## 2019-02-11 NOTE — Telephone Encounter (Signed)
Called pt and gave her MD message. Pt verbalized understanding. Pt also stated that she has an upcoming appointment and she has every intention of coming to this appt.

## 2019-02-14 NOTE — Telephone Encounter (Signed)
error 

## 2019-02-16 ENCOUNTER — Telehealth (INDEPENDENT_AMBULATORY_CARE_PROVIDER_SITE_OTHER): Payer: Medicare HMO | Admitting: Endocrinology

## 2019-02-16 ENCOUNTER — Other Ambulatory Visit: Payer: Self-pay | Admitting: Family Medicine

## 2019-02-16 DIAGNOSIS — E1165 Type 2 diabetes mellitus with hyperglycemia: Secondary | ICD-10-CM

## 2019-02-16 DIAGNOSIS — Z794 Long term (current) use of insulin: Secondary | ICD-10-CM

## 2019-02-16 NOTE — Telephone Encounter (Signed)
Is this too low for her?

## 2019-02-16 NOTE — Telephone Encounter (Signed)
Virtual Visit via Telephone Note  I connected with Vanessa Gibson today at 11:30 AM by telephone and verified that I am speaking with the correct person using two identifiers.   I discussed the availability of in person appointments but she is not able to come in until next month. I also discussed with the patient that there may be a    charge related to this service. The patient expressed understanding and agreed to proceed.   History of Present Illness:   Chief complaint: Concern about possible low sugars  INSULIN dose: Tresiba 20 units at night,  Novolog 6 units at breakfast and 8 units at supper  Her last A1c was 6.8 She has not been seen in follow-up for several months  Current management, blood sugar patterns and problems identified:  Patient says that occasionally her blood sugars may be around 85-95 range she does not know what to do or be concerned about this  She does not feel hypoglycemic at any point including when blood sugars are in the 70s  She is afraid however that when she has a blood sugar in the 80s at bedtime whether she should take her Tyler Aas changed the dose  Discussed her blood sugar ranges which are as follows  FASTING usually near normal range 78-128, highest 134  No testing at lunch, dinnertime blood sugars are variably high depending on her intake during the day and as high as 274 when she was eating  Bedtime blood sugars are as low as 86 and frequently below 100  She takes 2 units less of NovoLog at breakfast and suppertime if blood sugars are below 100 as directed  However instead of taking 6 units as her baseline she is taking 8 units at dinnertime  Overall appetite is only fair  She is sometimes eating a meal at lunchtime or only having Glucerna    Assessment and Plan:  Patient does need to be evaluated with A1c but she is not able to come in now However for her age her blood sugars appear to be low normal except between lunch and  dinner Discussed that since fasting readings are frequently below 100 she can reduce her TRESIBA down to 18 units She will also reduce suppertime dose by 2 units and take only 6 units as a base amount Advised her that if she has carbohydrates such as a sandwich at lunch she will take 4 units of NovoLog at lunch but not if she is having Glucerna  Follow Up Instructions:   She will keep her appointment as scheduled next month  I discussed the assessment and treatment plan with the patient. The patient was provided an opportunity to ask questions and all were answered. The patient agreed with the plan and demonstrated an understanding of the instructions.   The patient was advised to call back or seek an in-person evaluation if the symptoms worsen or if the condition fails to improve as anticipated.  I provided 8 minutes of non-face-to-face time during this encounter.   Elayne Snare, MD

## 2019-02-16 NOTE — Telephone Encounter (Signed)
Patient stated that she is having a hard time with her blood sugar being low.  She stated that its been very low at night around the 86-93. She would like to know what she should do with the tresiba.

## 2019-02-19 ENCOUNTER — Other Ambulatory Visit: Payer: Self-pay | Admitting: Endocrinology

## 2019-02-24 ENCOUNTER — Telehealth: Payer: Self-pay

## 2019-02-24 NOTE — Telephone Encounter (Signed)
Forwarding to Dr. Juleen China as Juluis Rainier for appointment tomorrow.

## 2019-02-24 NOTE — Telephone Encounter (Signed)
Patient called in stating she was feeling weak and had a slight sore throat that comes and goes.  States she is "locked in her room and cannot go anywhere".  Continues to have ongoing neck pain.  Has some pain in her ears.  Nothing severe.  No difficulty breathing, no chest pain.  No cough or fever.  Patient would like to have an appointment with PCP, who is out office today.  Patient does not have a computer or smart phone.  Accepted appointment with Dr. Juleen China via telephone on Friday.  Advised to let staff know if symptoms worsened and to proceed to ED before her appointment tomorrow if necessary.  Patient verbalized understanding.

## 2019-02-24 NOTE — Progress Notes (Signed)
Virtual Visit via Video   I connected with Lynelle Meiklejohn on 02/27/19 at 11:40 AM EDT by a video enabled telemedicine application and verified that I am speaking with the correct person using two identifiers. Location patient: Home Location provider:  [facility], Office Persons participating in the virtual visit: Briana Shawgo, Briscoe Deutscher, DO, [name], as CMA scribe.    I discussed the limitations of evaluation and management by telemedicine and the availability of in person appointments. The patient expressed understanding and agreed to proceed.  Subjective:   HPI: Patient called in stating she was feeling weak and had a slight sore throat that comes and goes.  States she is "locked in her room and cannot go anywhere".  Continues to have ongoing neck pain.  Has some pain in her ears.  Nothing severe.  No difficulty breathing, no chest pain.  No cough or fever.  Patient would like to have an appointment with PCP, who is out office today.  Patient does not have a computer or smart phone.  Accepted appointment with Dr. Juleen China via telephone on Friday.  Advised to let staff know if symptoms worsened and to proceed to ED before her appointment tomorrow if necessary.  Patient verbalized understanding. They have had two positive Covid-19 at her facility but they live in another building.   They are not allowed outside of room only for a few minutes to walk up and down the hallway.Thay are allowed to go out and do laundry but that is about all. She does not think that it has anything to do with the restrictions because it started before that. She has not been eating will but is drinking a meal supplement daily. Her fasting blood sugar today was 118. She does have little bit of sore throat but no fever. They will start checking vitals on them 1-3 times a week. Weakness is all day long. She is not able to stand during shower has to sit down in middle of it. She has safety devices all in home.  Including shower seat. She will try to increase water intake to see if she has some improvement. She has some improvement with sleeping. She is up to 4 hours a night now. She has not had any symptoms of UTI at all. Never had history of them in the past.   We will call facility to see if lab work can be done there.   Reviewed all precautions and expectations with prevention of Covid-19.   ROS: See pertinent positives and negatives per HPI.  Patient Active Problem List   Diagnosis Date Noted  . Bilateral hand numbness 10/01/2018  . Neck pain 10/01/2018  . Bilateral lower extremity edema 06/18/2018  . Primary osteoarthritis of both knees 06/18/2018  . Arthralgia of both hands 06/18/2018  . Chronic cough 06/18/2018  . Hernia of abdominal wall 06/18/2018  . ILD (interstitial lung disease) ([place]) 04/05/2018  . Depression, recurrent ([place]), unhappy in [place] but feels stuck, declines medication or counseling 01/24/2018  . At risk for polypharmacy, due to vitamin overuse, working with patient to "deprescribe" 01/24/2018  . Constipation, uses Miralax prn 01/24/2018  . Diabetic peripheral neuropathy associated with type 2 diabetes mellitus ([place]), unable to tolerate Neurontin 01/24/2018  . Aortic atherosclerosis ([place]), 2018 CXR 01/24/2018  . Lumbar spine scoliosis 01/24/2018  . Hyperlipidemia associated with type 2 diabetes mellitus ([place]), on statin 01/24/2018  . IBS (irritable bowel syndrome) 01/24/2018  . Uncontrolled type 2 diabetes mellitus with hyperglycemia, with long-term  current use of insulin ([place]) 12/02/2017  . Insomnia 06/06/2017  . CKD stage 3 secondary to diabetes ([place]), on Lisinopril 05/26/2017  . Osteoporosis, post-menopausal 02/06/2016  . Bilateral hearing loss, refuses hearing aids or further testing 02/06/2016  . Arthritis, takes Tumeric  02/06/2016  . Anemia of chronic disease 12/05/2015  . Protein-calorie malnutrition, weight stable 11/13/2015  . Chronic pancreatitis ([place])  11/13/2015  . GERD (gastroesophageal reflux disease), on Nexium 11/13/2015  . Hypertension associated with diabetes ([place]), on Lisinopril 11/13/2015  . Allergic rhinitis, Rx Xyzal, flonase, atrovent and has been offered referral to Allergy 11/13/2015    Social History   Tobacco Use  . Smoking status: Never Smoker  . Smokeless tobacco: Never Used  Substance Use Topics  . Alcohol use: No    Current Outpatient Medications:  .  ACCU-CHEK GUIDE test strip, CHECK BLOOD SUGAR THREE TO FOUR TIMES DAILY, Disp: 400 each, Rfl: 2 .  acetaminophen (TYLENOL) 325 MG tablet, Take 2 tablets (650 mg total) by mouth every 6 (six) hours as needed for moderate pain or headache., Disp: , Rfl:  .  Alcohol Swabs (B-D SINGLE USE SWABS REGULAR) PADS, USE TO CLEAN FINGER BEFORE CHECKING BLOOD SUGARS AND CLEAN SKIN BEFORE INSULIN INJECTIONS., Disp: 300 each, Rfl: 2 .  Ascorbic Acid (VITAMIN C) 1000 MG tablet, Take 1,000 mg by mouth daily., Disp: , Rfl:  .  aspirin EC 81 MG tablet, Take 81 mg by mouth daily., Disp: , Rfl:  .  BD PEN NEEDLE NANO U/F 32G X 4 MM MISC, USE TO INJECT INSULINS 4 TIMES DAILY., Disp: 200 each, Rfl: 0 .  Blood Glucose Monitoring Suppl (ACCU-CHEK GUIDE) w/Device KIT, USE AS DIRECTED, Disp: 1 kit, Rfl: 1 .  citalopram (CELEXA) 10 MG tablet, TAKE 1 TABLET ONCE DAILY., Disp: 30 tablet, Rfl: 0 .  fluticasone (FLONASE) 50 MCG/ACT nasal spray, Place 2 sprays into both nostrils daily., Disp: 16 g, Rfl: 0 .  furosemide (LASIX) 20 MG tablet, TAKE 1 TABLET ONCE DAILY., Disp: 30 tablet, Rfl: 0 .  insulin aspart (NOVOLOG FLEXPEN) 100 UNIT/ML FlexPen, Inject 3 units before breakfast and 4 units before supper (Patient taking differently: Inject 6 units before breakfast and 8 units before supper), Disp: 5 pen, Rfl: 4 .  insulin degludec (TRESIBA FLEXTOUCH) 100 UNIT/ML SOPN FlexTouch Pen, Inject 20 units into the skin once daily, Disp: 15 mL, Rfl: 1 .  ipratropium (ATROVENT) 0.03 % nasal spray, USE 2 SPRAYS IN  EACH NOSTRIL EVERY 12 HOURS., Disp: 30 mL, Rfl: 0 .  isosorbide mononitrate (IMDUR) 30 MG 24 hr tablet, TAKE 1 TABLET EVERY DAY, Disp: 90 tablet, Rfl: 0 .  LORazepam (ATIVAN) 0.5 MG tablet, TAKE ONE TABLET AT BEDTIME., Disp: 30 tablet, Rfl: 0 .  lovastatin (MEVACOR) 20 MG tablet, TAKE 1 TABLET AT BEDTIME, Disp: 90 tablet, Rfl: 3 .  metoprolol tartrate (LOPRESSOR) 25 MG tablet, TAKE 1 TABLET TWICE DAILY (Patient not taking: Reported on 12/27/2018), Disp: 180 tablet, Rfl: 2 .  montelukast (SINGULAIR) 10 MG tablet, TAKE ONE TABLET AT BEDTIME., Disp: 30 tablet, Rfl: 0 .  Multiple Vitamins-Minerals (CENTRUM SILVER 50+WOMEN) TABS, Take 1 tablet by mouth daily., Disp: , Rfl:  .  Multiple Vitamins-Minerals (HAIR/SKIN/NAILS/BIOTIN PO), Take 1 tablet by mouth daily., Disp: , Rfl:  .  multivitamin-lutein (OCUVITE-LUTEIN) CAPS capsule, Take 1 capsule by mouth daily., Disp: , Rfl:  .  potassium chloride (K-DUR,KLOR-CON) 10 MEQ tablet, Take 10 mEq by mouth daily., Disp: , Rfl:  .  TRUEPLUS LANCETS  28G MISC, 1 each by Other route 4 (four) times daily. Use as instructed to check blood sugar 4 times daily. Must have follow up for any future refills., Disp: 150 each, Rfl: 0  No Known Allergies  Objective:   VITALS: Per patient if applicable, see vitals. GENERAL: Alert, no acute distress. CARDIOPULMONARY: No increased WOB. Speaking in clear sentences. I:E ratio WNL.  PSYCH: Cooperative. Speech normal rate and rhythm. Affect is appropriate. Insight and judgement are appropriate. Attention is focused, linear, and appropriate.   Assessment and Plan:   Jhordyn was seen today for follow-up.  Diagnoses and all orders for this visit:  Gastroesophageal reflux disease without esophagitis  Seasonal allergic rhinitis due to pollen -     fluticasone (FLONASE) 50 MCG/ACT nasal spray; Place 2 sprays into both nostrils daily. -     montelukast (SINGULAIR) 10 MG tablet; Take 1 tablet (10 mg total) by mouth at  bedtime.  Insomnia Comments: Doing much better. Okay to continue. Risks discussed.  Orders: -     LORazepam (ATIVAN) 0.5 MG tablet; Take 1 tablet (0.5 mg total) by mouth at bedtime.  Cough Comments: Patient with symptoms > 2 weeks. Okay to treat today.   Hyperlipidemia associated with type 2 diabetes mellitus ([place]), on statin -     lovastatin (MEVACOR) 20 MG tablet; Take 1 tablet (20 mg total) by mouth at bedtime.  Depression, recurrent ([place]), unhappy in [place] but feels stuck, declines medication or counseling -     citalopram (CELEXA) 10 MG tablet; Take 1 tablet (10 mg total) by mouth daily.  Bilateral lower extremity edema -     furosemide (LASIX) 20 MG tablet; Take 1 tablet (20 mg total) by mouth daily.  Chronic fatigue and malaise Comments: Depression and deconditioning driving this. Will need labs. COVID precautions in place - will arrange.     . Reviewed expectations re: course of current medical issues. . Discussed self-management of symptoms. . Outlined signs and symptoms indicating need for more acute intervention. . Patient verbalized understanding and all questions were answered. Marland Kitchen Health Maintenance issues including appropriate healthy diet, exercise, and smoking avoidance were discussed with patient. . See orders for this visit as documented in the electronic medical record.  Briscoe Deutscher, DO 02/27/2019

## 2019-02-25 ENCOUNTER — Other Ambulatory Visit: Payer: Self-pay

## 2019-02-25 ENCOUNTER — Ambulatory Visit (INDEPENDENT_AMBULATORY_CARE_PROVIDER_SITE_OTHER): Payer: Medicare HMO | Admitting: Family Medicine

## 2019-02-25 ENCOUNTER — Encounter: Payer: Self-pay | Admitting: Family Medicine

## 2019-02-25 VITALS — Ht 59.0 in | Wt 121.0 lb

## 2019-02-25 DIAGNOSIS — R600 Localized edema: Secondary | ICD-10-CM

## 2019-02-25 DIAGNOSIS — R5381 Other malaise: Secondary | ICD-10-CM | POA: Diagnosis not present

## 2019-02-25 DIAGNOSIS — K219 Gastro-esophageal reflux disease without esophagitis: Secondary | ICD-10-CM

## 2019-02-25 DIAGNOSIS — G4700 Insomnia, unspecified: Secondary | ICD-10-CM

## 2019-02-25 DIAGNOSIS — R05 Cough: Secondary | ICD-10-CM

## 2019-02-25 DIAGNOSIS — F339 Major depressive disorder, recurrent, unspecified: Secondary | ICD-10-CM

## 2019-02-25 DIAGNOSIS — E1169 Type 2 diabetes mellitus with other specified complication: Secondary | ICD-10-CM | POA: Diagnosis not present

## 2019-02-25 DIAGNOSIS — R5382 Chronic fatigue, unspecified: Secondary | ICD-10-CM

## 2019-02-25 DIAGNOSIS — R059 Cough, unspecified: Secondary | ICD-10-CM

## 2019-02-25 DIAGNOSIS — J301 Allergic rhinitis due to pollen: Secondary | ICD-10-CM

## 2019-02-25 DIAGNOSIS — E785 Hyperlipidemia, unspecified: Secondary | ICD-10-CM

## 2019-02-25 MED ORDER — CITALOPRAM HYDROBROMIDE 10 MG PO TABS: 10.0000 mg | ORAL_TABLET | Freq: Every day | ORAL | 1 refills | Status: DC

## 2019-02-25 MED ORDER — FLUTICASONE PROPIONATE 50 MCG/ACT NA SUSP: 2.0000 | Freq: Every day | NASAL | 0 refills | Status: DC

## 2019-02-25 MED ORDER — LORAZEPAM 0.5 MG PO TABS: 0.5000 mg | ORAL_TABLET | Freq: Every day | ORAL | 0 refills | Status: DC

## 2019-02-25 MED ORDER — MONTELUKAST SODIUM 10 MG PO TABS: 10.0000 mg | ORAL_TABLET | Freq: Every day | ORAL | 0 refills | Status: DC

## 2019-02-25 MED ORDER — LOVASTATIN 20 MG PO TABS: 20.0000 mg | ORAL_TABLET | Freq: Every day | ORAL | 3 refills | Status: DC

## 2019-02-25 MED ORDER — FUROSEMIDE 20 MG PO TABS: 20.0000 mg | ORAL_TABLET | Freq: Every day | ORAL | 0 refills | Status: DC

## 2019-02-27 ENCOUNTER — Encounter: Payer: Self-pay | Admitting: Family Medicine

## 2019-03-04 ENCOUNTER — Telehealth: Payer: Self-pay | Admitting: Family Medicine

## 2019-03-04 NOTE — Telephone Encounter (Signed)
Patient left message requesting to speak to Dr. Juleen China, she didn't state what she wanted to discuss.

## 2019-03-07 ENCOUNTER — Encounter: Payer: Self-pay | Admitting: Family Medicine

## 2019-03-07 ENCOUNTER — Ambulatory Visit (INDEPENDENT_AMBULATORY_CARE_PROVIDER_SITE_OTHER): Payer: Medicare HMO | Admitting: Family Medicine

## 2019-03-07 ENCOUNTER — Telehealth: Payer: Self-pay | Admitting: Family Medicine

## 2019-03-07 ENCOUNTER — Other Ambulatory Visit: Payer: Self-pay

## 2019-03-07 DIAGNOSIS — B9689 Other specified bacterial agents as the cause of diseases classified elsewhere: Secondary | ICD-10-CM

## 2019-03-07 DIAGNOSIS — J329 Chronic sinusitis, unspecified: Secondary | ICD-10-CM

## 2019-03-07 MED ORDER — AZELASTINE HCL 0.1 % NA SOLN: 1.0000 | Freq: Two times a day (BID) | NASAL | 12 refills | Status: DC

## 2019-03-07 MED ORDER — AZITHROMYCIN 250 MG PO TABS: ORAL_TABLET | ORAL | 0 refills | Status: DC

## 2019-03-07 NOTE — Telephone Encounter (Signed)
See request °

## 2019-03-07 NOTE — Telephone Encounter (Signed)
[facility] [phone] called needed a diagnosis code for the zpak sent in for patient due to new regulations.  Please contact.

## 2019-03-07 NOTE — Telephone Encounter (Signed)
Called patient seen by Dr. Juleen China virtual visits today

## 2019-03-07 NOTE — Telephone Encounter (Signed)
See note

## 2019-03-07 NOTE — Progress Notes (Signed)
Virtual Visit via Video   I connected with Vanessa Gibson on 03/07/19 at  1:40 PM EDT by a video enabled telemedicine application and verified that I am speaking with the correct person using two identifiers. Location patient: Home Location provider: [facility], Office Persons participating in the virtual visit: Tanna Joye, Francella Solian, [place] . [name], CMA acting as scribe for Dr. Briscoe Deutscher.    I discussed the limitations of evaluation and management by telemedicine and the availability of in person appointments. The patient expressed understanding and agreed to proceed.  Subjective:   HPI: patient called having ongoing sinus pain and pressure. She is having some congestion as well as dry cough.   ROS: See pertinent positives and negatives per HPI.  Patient Active Problem List   Diagnosis Date Noted  . Bilateral hand numbness 10/01/2018  . Neck pain 10/01/2018  . Bilateral lower extremity edema 06/18/2018  . Primary osteoarthritis of both knees 06/18/2018  . Arthralgia of both hands 06/18/2018  . Chronic cough 06/18/2018  . Hernia of abdominal wall 06/18/2018  . ILD (interstitial lung disease) ([place]) 04/05/2018  . Depression, recurrent ([place]), unhappy in [place] but feels stuck, declines medication or counseling 01/24/2018  . At risk for polypharmacy, due to vitamin overuse, working with patient to "deprescribe" 01/24/2018  . Constipation, uses Miralax prn 01/24/2018  . Diabetic peripheral neuropathy associated with type 2 diabetes mellitus ([place]), unable to tolerate Neurontin 01/24/2018  . Aortic atherosclerosis ([place]), 2018 CXR 01/24/2018  . Lumbar spine scoliosis 01/24/2018  . Hyperlipidemia associated with type 2 diabetes mellitus ([place]), on statin 01/24/2018  . IBS (irritable bowel syndrome) 01/24/2018  . Uncontrolled type 2 diabetes mellitus with hyperglycemia, with long-term current use of insulin ([place]) 12/02/2017  . Insomnia 06/06/2017  . CKD stage 3 secondary  to diabetes ([place]), on Lisinopril 05/26/2017  . Osteoporosis, post-menopausal 02/06/2016  . Bilateral hearing loss, refuses hearing aids or further testing 02/06/2016  . Arthritis, takes Tumeric  02/06/2016  . Anemia of chronic disease 12/05/2015  . Protein-calorie malnutrition, weight stable 11/13/2015  . Chronic pancreatitis ([place]) 11/13/2015  . GERD (gastroesophageal reflux disease), on Nexium 11/13/2015  . Hypertension associated with diabetes ([place]), on Lisinopril 11/13/2015  . Allergic rhinitis, Rx Xyzal, flonase, atrovent and has been offered referral to Allergy 11/13/2015    Social History   Tobacco Use  . Smoking status: Never Smoker  . Smokeless tobacco: Never Used  Substance Use Topics  . Alcohol use: No    Current Outpatient Medications:  .  acetaminophen (TYLENOL) 325 MG tablet, Take 2 tablets (650 mg total) by mouth every 6 (six) hours as needed for moderate pain or headache., Disp: , Rfl:  .  Ascorbic Acid (VITAMIN C) 1000 MG tablet, Take 1,000 mg by mouth daily., Disp: , Rfl:  .  aspirin EC 81 MG tablet, Take 81 mg by mouth daily., Disp: , Rfl:  .  citalopram (CELEXA) 10 MG tablet, Take 1 tablet (10 mg total) by mouth daily., Disp: 90 tablet, Rfl: 1 .  fluticasone (FLONASE) 50 MCG/ACT nasal spray, Place 2 sprays into both nostrils daily., Disp: 16 g, Rfl: 0 .  furosemide (LASIX) 20 MG tablet, Take 1 tablet (20 mg total) by mouth daily., Disp: 30 tablet, Rfl: 0 .  insulin aspart (NOVOLOG FLEXPEN) 100 UNIT/ML FlexPen, Inject 3 units before breakfast and 4 units before supper (Patient taking differently: Inject 6 units before breakfast and 8 units before supper), Disp: 5 pen, Rfl: 4 .  insulin degludec (TRESIBA FLEXTOUCH) 100 UNIT/ML SOPN FlexTouch Pen, Inject 20 units into the skin once daily, Disp: 15 mL, Rfl: 1 .  isosorbide mononitrate (IMDUR) 30 MG 24 hr tablet, TAKE 1 TABLET EVERY DAY, Disp: 90 tablet, Rfl: 0 .  LORazepam (ATIVAN) 0.5 MG tablet, Take 1 tablet (0.5 mg  total) by mouth at bedtime., Disp: 30 tablet, Rfl: 0 .  lovastatin (MEVACOR) 20 MG tablet, Take 1 tablet (20 mg total) by mouth at bedtime., Disp: 90 tablet, Rfl: 3 .  metoprolol tartrate (LOPRESSOR) 25 MG tablet, TAKE 1 TABLET TWICE DAILY, Disp: 180 tablet, Rfl: 2 .  montelukast (SINGULAIR) 10 MG tablet, Take 1 tablet (10 mg total) by mouth at bedtime., Disp: 90 tablet, Rfl: 0 .  Multiple Vitamins-Minerals (CENTRUM SILVER 50+WOMEN) TABS, Take 1 tablet by mouth daily., Disp: , Rfl:  .  Multiple Vitamins-Minerals (HAIR/SKIN/NAILS/BIOTIN PO), Take 1 tablet by mouth daily., Disp: , Rfl:  .  multivitamin-lutein (OCUVITE-LUTEIN) CAPS capsule, Take 1 capsule by mouth daily., Disp: , Rfl:  .  omeprazole (PRILOSEC) 40 MG capsule, Take 1 capsule by mouth daily., Disp: , Rfl:  .  potassium chloride (K-DUR) 10 MEQ tablet, Take 1 tablet by mouth daily., Disp: , Rfl:   No Known Allergies  Objective:   VITALS: Per patient if applicable, see vitals. GENERAL: Alert, appears well and in no acute distress. HEENT: Atraumatic, conjunctiva clear, no obvious abnormalities on inspection of external nose and ears. NECK: Normal movements of the head and neck. CARDIOPULMONARY: No increased WOB. Speaking in clear sentences. I:E ratio WNL.  MS: Moves all visible extremities without noticeable abnormality. PSYCH: Pleasant and cooperative, well-groomed. Speech normal rate and rhythm. Affect is appropriate. Insight and judgement are appropriate. Attention is focused, linear, and appropriate.  NEURO: CN grossly intact. Oriented as arrived to appointment on time with no prompting. Moves both UE equally.  SKIN: No obvious lesions, wounds, erythema, or cyanosis noted on face or hands.  Assessment and Plan:   There are no diagnoses linked to this encounter.  . Reviewed expectations re: course of current medical issues. . Discussed self-management of symptoms. . Outlined signs and symptoms indicating need for more acute  intervention. . Patient verbalized understanding and all questions were answered. Marland Kitchen Health Maintenance issues including appropriate healthy diet, exercise, and smoking avoidance were discussed with patient. . See orders for this visit as documented in the electronic medical record.  Francella Solian, [place] 03/07/2019

## 2019-03-07 NOTE — Progress Notes (Signed)
Virtual Visit via Video   I connected with Roda Sidor on 03/07/19 at  1:40 PM EDT by a video enabled telemedicine application and verified that I am speaking with the correct person using two identifiers. Location patient: Home  Location provider: [facility], Office  Persons participating in the virtual visit: Mekayla Elena, Briscoe Deutscher, DO   I discussed the limitations of evaluation and management by telemedicine and the availability of in person appointments. The patient expressed understanding and agreed to proceed.  Subjective:   HPI: Patient c/o sinus pain and pressure, thick purulent drainage, otalgia. Fatigue at baseline. Feels stuffy. Uses allergy medications daily - Singulair, Flonase. No dizziness. No fever. No cough.  ROS: See pertinent positives and negatives per HPI.  Patient Active Problem List   Diagnosis Date Noted  . Bilateral hand numbness 10/01/2018  . Neck pain 10/01/2018  . Bilateral lower extremity edema 06/18/2018  . Primary osteoarthritis of both knees 06/18/2018  . Arthralgia of both hands 06/18/2018  . Chronic cough 06/18/2018  . Hernia of abdominal wall 06/18/2018  . ILD (interstitial lung disease) ([place]) 04/05/2018  . Depression, recurrent ([place]), unhappy in [place] but feels stuck, declines medication or counseling 01/24/2018  . At risk for polypharmacy, due to vitamin overuse, working with patient to "deprescribe" 01/24/2018  . Constipation, uses Miralax prn 01/24/2018  . Diabetic peripheral neuropathy associated with type 2 diabetes mellitus ([place]), unable to tolerate Neurontin 01/24/2018  . Aortic atherosclerosis ([place]), 2018 CXR 01/24/2018  . Lumbar spine scoliosis 01/24/2018  . Hyperlipidemia associated with type 2 diabetes mellitus ([place]), on statin 01/24/2018  . IBS (irritable bowel syndrome) 01/24/2018  . Uncontrolled type 2 diabetes mellitus with hyperglycemia, with long-term current use of insulin ([place]) 12/02/2017  . Insomnia 06/06/2017  . CKD  stage 3 secondary to diabetes ([place]), on Lisinopril 05/26/2017  . Osteoporosis, post-menopausal 02/06/2016  . Bilateral hearing loss, refuses hearing aids or further testing 02/06/2016  . Arthritis, takes Tumeric  02/06/2016  . Anemia of chronic disease 12/05/2015  . Protein-calorie malnutrition, weight stable 11/13/2015  . Chronic pancreatitis ([place]) 11/13/2015  . GERD (gastroesophageal reflux disease), on Nexium 11/13/2015  . Hypertension associated with diabetes ([place]), on Lisinopril 11/13/2015  . Allergic rhinitis, Rx Xyzal, flonase, atrovent and has been offered referral to Allergy 11/13/2015    Social History   Tobacco Use  . Smoking status: Never Smoker  . Smokeless tobacco: Never Used  Substance Use Topics  . Alcohol use: No    Current Outpatient Medications:  .  acetaminophen (TYLENOL) 325 MG tablet, Take 2 tablets (650 mg total) by mouth every 6 (six) hours as needed for moderate pain or headache., Disp: , Rfl:  .  Ascorbic Acid (VITAMIN C) 1000 MG tablet, Take 1,000 mg by mouth daily., Disp: , Rfl:  .  aspirin EC 81 MG tablet, Take 81 mg by mouth daily., Disp: , Rfl:  .  citalopram (CELEXA) 10 MG tablet, Take 1 tablet (10 mg total) by mouth daily., Disp: 90 tablet, Rfl: 1 .  fluticasone (FLONASE) 50 MCG/ACT nasal spray, Place 2 sprays into both nostrils daily., Disp: 16 g, Rfl: 0 .  furosemide (LASIX) 20 MG tablet, Take 1 tablet (20 mg total) by mouth daily., Disp: 30 tablet, Rfl: 0 .  insulin aspart (NOVOLOG FLEXPEN) 100 UNIT/ML FlexPen, Inject 3 units before breakfast and 4 units before supper (Patient taking differently: Inject 6 units before breakfast and 8 units before supper), Disp: 5 pen, Rfl: 4 .  insulin degludec (  TRESIBA FLEXTOUCH) 100 UNIT/ML SOPN FlexTouch Pen, Inject 20 units into the skin once daily, Disp: 15 mL, Rfl: 1 .  isosorbide mononitrate (IMDUR) 30 MG 24 hr tablet, TAKE 1 TABLET EVERY DAY, Disp: 90 tablet, Rfl: 0 .  LORazepam (ATIVAN) 0.5 MG tablet, Take 1  tablet (0.5 mg total) by mouth at bedtime., Disp: 30 tablet, Rfl: 0 .  lovastatin (MEVACOR) 20 MG tablet, Take 1 tablet (20 mg total) by mouth at bedtime., Disp: 90 tablet, Rfl: 3 .  metoprolol tartrate (LOPRESSOR) 25 MG tablet, TAKE 1 TABLET TWICE DAILY, Disp: 180 tablet, Rfl: 2 .  montelukast (SINGULAIR) 10 MG tablet, Take 1 tablet (10 mg total) by mouth at bedtime., Disp: 90 tablet, Rfl: 0 .  Multiple Vitamins-Minerals (CENTRUM SILVER 50+WOMEN) TABS, Take 1 tablet by mouth daily., Disp: , Rfl:  .  Multiple Vitamins-Minerals (HAIR/SKIN/NAILS/BIOTIN PO), Take 1 tablet by mouth daily., Disp: , Rfl:  .  multivitamin-lutein (OCUVITE-LUTEIN) CAPS capsule, Take 1 capsule by mouth daily., Disp: , Rfl:  .  omeprazole (PRILOSEC) 40 MG capsule, Take 1 capsule by mouth daily., Disp: , Rfl:  .  potassium chloride (K-DUR) 10 MEQ tablet, Take 1 tablet by mouth daily., Disp: , Rfl:   No Known Allergies  Objective:   VITALS: Per patient if applicable, see vitals. GENERAL: Alert, appears well and in no acute distress. HEENT: Atraumatic, conjunctiva clear, no obvious abnormalities on inspection of external nose and ears. NECK: Normal movements of the head and neck. CARDIOPULMONARY: No increased WOB. Speaking in clear sentences. I:E ratio WNL.  MS: Moves all visible extremities without noticeable abnormality. PSYCH: Pleasant and cooperative, well-groomed. Speech normal rate and rhythm. Affect is appropriate. Insight and judgement are appropriate. Attention is focused, linear, and appropriate.  NEURO: CN grossly intact. Oriented as arrived to appointment on time with no prompting. Moves both UE equally.  SKIN: No obvious lesions, wounds, erythema, or cyanosis noted on face or hands.  Assessment and Plan:   Nylene was seen today for sinusitis.  Diagnoses and all orders for this visit:  Bacterial sinusitis -     azithromycin (ZITHROMAX) 250 MG tablet; 2 po on day one, then one po daily until gone. -      azelastine (ASTELIN) 0.1 % nasal spray; Place 1 spray into both nostrils 2 (two) times daily. Use in each nostril as directed   . Reviewed expectations re: course of current medical issues. . Discussed self-management of symptoms. . Outlined signs and symptoms indicating need for more acute intervention. . Patient verbalized understanding and all questions were answered. Marland Kitchen Health Maintenance issues including appropriate healthy diet, exercise, and smoking avoidance were discussed with patient. . See orders for this visit as documented in the electronic medical record.  Briscoe Deutscher, DO 03/07/2019

## 2019-03-08 NOTE — Telephone Encounter (Signed)
Called pharmacy and provided dx codes J32.9, B96.89.

## 2019-03-11 ENCOUNTER — Telehealth: Payer: Self-pay | Admitting: Family Medicine

## 2019-03-11 DIAGNOSIS — B9689 Other specified bacterial agents as the cause of diseases classified elsewhere: Secondary | ICD-10-CM

## 2019-03-11 DIAGNOSIS — J329 Chronic sinusitis, unspecified: Principal | ICD-10-CM

## 2019-03-11 NOTE — Telephone Encounter (Signed)
Patient left a message and stated that her neck/head pain is a little better and overall she feels a little better. She is wanting to know if she needs to have another round of antibiotic. Patient states her nose is still dry and she is just not all the way better yet, and with only one tablet left she feels if may need to be repeated. Please contact patient [phone] (mobile)

## 2019-03-11 NOTE — Telephone Encounter (Signed)
See note

## 2019-03-14 MED ORDER — AZITHROMYCIN 250 MG PO TABS: ORAL_TABLET | ORAL | 0 refills | Status: DC

## 2019-03-14 NOTE — Addendum Note (Signed)
Addended by: Jasper Loser on: 03/14/2019 03:23 PM   Modules accepted: Orders

## 2019-03-14 NOTE — Telephone Encounter (Signed)
Okay refill zpak as patient requested. Virtual visit if not improving.

## 2019-03-14 NOTE — Telephone Encounter (Signed)
Spoke with patient. Today she is having pressure in her ears. Ears are warm to touch. Neck is still hurting after turning her head too fast. No fever/feverish. Increased fatigue. Some pressure in sinuses. Nose and mouth are very try. She denies HA but has a lot of pressure in her head. No postnasal drainage. No coughing or chest congestion.   Forwarding to Dr. Juleen China to advise.

## 2019-03-14 NOTE — Telephone Encounter (Signed)
Rx refilled to [facility]. Called pt and advised. She will call back to schedule virtual visit if sx don't improve after this round of abx.

## 2019-03-15 ENCOUNTER — Other Ambulatory Visit: Payer: Self-pay | Admitting: Endocrinology

## 2019-03-16 ENCOUNTER — Ambulatory Visit: Payer: Self-pay | Admitting: Family Medicine

## 2019-03-16 NOTE — Telephone Encounter (Signed)
Pt. Reports she is still on antibiotics for sinus infection. Lives at [facility] and they checked her VS this morning - BP 112/62, PULSE 85, glucose 123. States she feels "very weak - but I have felt weak for awhile. My BP this morning is lower than usual." Reports she ate crab meat and yogurt for breakfast. Admits she doesn't drink much water. Encouraged her to stay hydrated and try to eat protein. Concerned about her BP and being weak. Spoke with [name] in the practice. Will forward triage note.  Answer Assessment - Initial Assessment Questions 1. DESCRIPTION: "Describe how you are feeling."     Very weak 2. SEVERITY: "How bad is it?"  "Can you stand and walk?"   - MILD - Feels weak or tired, but does not interfere with work, school or normal activities   - [place] to stand and walk; weakness interferes with work, school, or normal activities   - SEVERE - Unable to stand or walk     Mild-moderate 3. ONSET:  "When did the weakness begin?"     Along time 4. CAUSE: "What do you think is causing the weakness?"     Unsure 5. MEDICINES: "Have you recently started a new medicine or had a change in the amount of a medicine?"     On an antibiotic 6. OTHER SYMPTOMS: "Do you have any other symptoms?" (e.g., chest pain, fever, cough, SOB, vomiting, diarrhea, bleeding, other areas of pain)     BP this morning 112/62  Pulse 85 7. PREGNANCY: "Is there any chance you are pregnant?" "When was your last menstrual period?"     No  Protocols used: WEAKNESS (GENERALIZED) AND FATIGUE-A-AH

## 2019-03-16 NOTE — Telephone Encounter (Signed)
See note, patient had televisit on 4/3

## 2019-03-16 NOTE — Telephone Encounter (Signed)
Please advise 

## 2019-03-17 NOTE — Telephone Encounter (Signed)
Will need to check in. If stable, stay the course.

## 2019-03-17 NOTE — Telephone Encounter (Signed)
Left message to return call to our office.  

## 2019-03-24 ENCOUNTER — Ambulatory Visit: Payer: Medicare HMO | Admitting: Endocrinology

## 2019-03-24 ENCOUNTER — Encounter

## 2019-03-25 ENCOUNTER — Ambulatory Visit (INDEPENDENT_AMBULATORY_CARE_PROVIDER_SITE_OTHER): Payer: Medicare HMO | Admitting: Family Medicine

## 2019-03-25 ENCOUNTER — Encounter: Payer: Self-pay | Admitting: Family Medicine

## 2019-03-25 ENCOUNTER — Telehealth: Payer: Self-pay

## 2019-03-25 VITALS — BP 117/66

## 2019-03-25 DIAGNOSIS — E1159 Type 2 diabetes mellitus with other circulatory complications: Secondary | ICD-10-CM

## 2019-03-25 DIAGNOSIS — E1165 Type 2 diabetes mellitus with hyperglycemia: Secondary | ICD-10-CM

## 2019-03-25 DIAGNOSIS — I10 Essential (primary) hypertension: Secondary | ICD-10-CM

## 2019-03-25 DIAGNOSIS — R5383 Other fatigue: Secondary | ICD-10-CM

## 2019-03-25 DIAGNOSIS — Z794 Long term (current) use of insulin: Secondary | ICD-10-CM | POA: Diagnosis not present

## 2019-03-25 DIAGNOSIS — F339 Major depressive disorder, recurrent, unspecified: Secondary | ICD-10-CM

## 2019-03-25 NOTE — Telephone Encounter (Signed)
Patient stated she has been weak,ears burning,mouth dry and dizziness,no fever no chest pain.Patient is concerned about her BP being low and BS low.Blood Pressure 117/66 and 110/67  Blood sugar this morning 94.Patient has a appt. scheduled.

## 2019-03-25 NOTE — Telephone Encounter (Signed)
Patient being seen by Dr. Jerline Pain today by telephone visit.

## 2019-03-25 NOTE — Progress Notes (Signed)
    Chief Complaint:  Vanessa Gibson is a 85 y.o. female who presents for a telephone visit with a chief complaint of fatigue.   Assessment/Plan:  Fatigue Likely multifactorial in setting of deconditioning, depression,  polypharmacy, and advanced age.  Hopefully will have some improvement with blood changes.  Encourage patient to stay as active as possible.  Also encouraged good oral hydration.  Type 2 diabetes She has had some lows to the upper 60s and low 70s.  While no classic hypoglycemic symptoms it is possible that her lower than average readings could be contributing to her fatigue.  She will continue taking NovoLog 4 units with breakfast and 8 units with supper.  We will further decrease her Tresiba to 17 units daily.  Advised her to follow-up with endocrinology soon.  Hypertension It is also possible that her low blood pressure is could be contributing to her fatigue.  She is currently on Imdur 30 mg daily and Metroprolol tartrate 25 mg twice daily.  She denies history of angina or chest pain.  We will temporarily stop her Imdur to see if she has some improvement in symptoms.  Recommended continued home blood pressure monitoring with goal 140/90 or lower.  She will follow up with PCP soon for this.    Subjective:  HPI:  Patient is concerned about low blood sugar and low blood pressure. Her sugars have been in the 70s and blood pressures have been in the 110s/60. Patient states that she usually likes for them to be in the 120s/70s-80s.  She has a longstanding history of fatigue this seems to have worsened over the last few days.  She thinks this is due to her low blood sugar and low blood pressure.  She has cut down her insulin and is now taking NovoLog 40 minutes in the morning and 6 units in the evening.  She is taking [name] 18 units daily.  No fever.  No cough.  No exertional dyspnea.  ROS: Per HPI  PMH: She reports that she has never smoked. She has never used smokeless tobacco. She  reports that she does not drink alcohol or use drugs.      Objective/Observations   No results found for this or any previous visit (from the past 72 hour(s)).   Telephone Visit   I connected with Rhenda Appleby on 03/25/19 at  2:00 PM EDT via telephone and verified that I am speaking with the correct person using two identifiers. I discussed the limitations of evaluation and management by telemedicine and the availability of in person appointments. The patient expressed understanding and agreed to proceed.   Patient location: Home Provider location: [place] participating in the virtual visit: Myself and Patient  A total of 21 minutes were spent on medical discussion.      Algis Greenhouse. Jerline Pain, MD 03/25/2019 2:24 PM

## 2019-03-31 ENCOUNTER — Ambulatory Visit: Payer: Self-pay | Admitting: *Deleted

## 2019-03-31 NOTE — Telephone Encounter (Signed)
See note

## 2019-03-31 NOTE — Telephone Encounter (Signed)
Patient is having concerns about her BP. Patient was told to stop her BP medication. Patient wants to start her BP medication.  125/69 P 90- yesterday Patient is confused about her BP medication and what she should be taking. Patient advised she is to continue her metoprolol as directed- but stop the isosorbide per Dr Jerline Pain. Patient voices understanding of what BP medications she is supposed to be taking (- she had stopped everything. ) Patient also states the nasal spray is helping the dryness in her sinus. Told patient I would relay conversation to PCP- she seems secure with her information now.  Reason for Disposition . Caller has medication question only, adult not sick, and triager answers question  Answer Assessment - Initial Assessment Questions 1. SYMPTOMS: "Do you have any symptoms?"     Patient feels that her BP is going up and she is fatigued- she has questions about her instructions regarding her BP medications.  Protocols used: MEDICATION QUESTION CALL-A-AH

## 2019-04-01 NOTE — Telephone Encounter (Signed)
Called and spoke with patient. She has no questions or concerns regarding her BP medications at this time. She will track her BP readings over the next week and contact the office next Friday for an update of BP readings.

## 2019-04-08 ENCOUNTER — Other Ambulatory Visit: Payer: Self-pay | Admitting: Family Medicine

## 2019-04-08 DIAGNOSIS — R600 Localized edema: Secondary | ICD-10-CM

## 2019-04-21 ENCOUNTER — Encounter: Payer: Self-pay | Admitting: Family Medicine

## 2019-04-21 ENCOUNTER — Ambulatory Visit: Payer: Self-pay

## 2019-04-21 ENCOUNTER — Ambulatory Visit (INDEPENDENT_AMBULATORY_CARE_PROVIDER_SITE_OTHER): Payer: 59 | Admitting: Family Medicine

## 2019-04-21 ENCOUNTER — Other Ambulatory Visit: Payer: Self-pay | Admitting: Family Medicine

## 2019-04-21 DIAGNOSIS — G4700 Insomnia, unspecified: Secondary | ICD-10-CM

## 2019-04-21 DIAGNOSIS — R600 Localized edema: Secondary | ICD-10-CM

## 2019-04-21 DIAGNOSIS — R5383 Other fatigue: Secondary | ICD-10-CM

## 2019-04-21 NOTE — Progress Notes (Signed)
    Chief Complaint:  Vanessa Gibson is a 85 y.o. female who presents for a telephone visit with a chief complaint of Fatigue.   Assessment/Plan:  Fatigue Likely multifactorial.  Think that her underlying depression is playing a large role.  Will have patient come into the office to check labs including CBC, C met, B12, and TSH.  If all these are normal, will need close PCP follow-up as it is possible that worsening depression could likely be contributing to her symptoms.  Advised her to continue checking her blood pressure at home, though reported blood pressures are at goal.  It is possible that her metoprolol could be contributing some to her symptoms.  Would consider weaning off if above is negative and continues to have issues with fatigue/lethargy.    Subjective:  HPI:  Fatigue  Patient last seen for this a few weeks ago.  At that time we changed her blood pressure medications.  She has continued to have worsening fatigue since then.  Home blood pressures have been in the 130s over 60 to 80s.  She is able to walk around her house however feels significant amount of tiredness with minimal exertion.  No chest pain or shortness of breath.  No dyspnea on exertion.  Overall feels like her mood is okay though does admit that "anyone would be depressed" in her current situation.  She feels like her Celexa is working well.  She is compliant with all of her other medications without side effects.  ROS: Per HPI  PMH: She reports that she has never smoked. She has never used smokeless tobacco. She reports that she does not drink alcohol or use drugs.      Objective/Observations   NAD, speaking in full sentences.   Telephone Visit   I connected with Noora Venson on 04/21/19 at  3:40 PM EDT via telephone and verified that I am speaking with the correct person using two identifiers. I discussed the limitations of evaluation and management by telemedicine and the availability of in person appointments.  The patient expressed understanding and agreed to proceed.   Patient location: Home Provider location: [place] participating in the virtual visit: Myself and PAtient   A total of 21 minutes were spent on medical discussion.      Algis Greenhouse. Jerline Pain, MD 04/21/2019 4:52 PM

## 2019-04-21 NOTE — Telephone Encounter (Signed)
Patient called and says she's been feeling tired for about 2 months, but it's getting worse. She says she can walk, but doesn't feel like doing anything. She says she's also having ongoing tingling in her right hand that's been there for a long time and new pain to the right leg and knee. I called the office and spoke to Stanley, [facility] who asks to speak to the patient, the call was connected successfully.  Answer Assessment - Initial Assessment Questions 1. DESCRIPTION: "Describe how you are feeling."     Tired, not feeling like doing anything 2. SEVERITY: "How bad is it?"  "Can you stand and walk?"   - MILD - Feels weak or tired, but does not interfere with work, school or normal activities   - [place] to stand and walk; weakness interferes with work, school, or normal activities   - SEVERE - Unable to stand or walk     Moderate 3. ONSET:  "When did the weakness begin?"     A couple of months, getting worse 4. CAUSE: "What do you think is causing the weakness?"     I don't know 5. MEDICINES: "Have you recently started a new medicine or had a change in the amount of a medicine?"     No 6. OTHER SYMPTOMS: "Do you have any other symptoms?" (e.g., chest pain, fever, cough, SOB, vomiting, diarrhea, bleeding, other areas of pain)     Right leg/knee pain, tingling right hand ongoing 7. PREGNANCY: "Is there any chance you are pregnant?" "When was your last menstrual period?"     No  Protocols used: WEAKNESS (GENERALIZED) AND FATIGUE-A-AH

## 2019-04-21 NOTE — Telephone Encounter (Signed)
Noted. Will forward to Dr. Juleen China as Juluis Rainier.

## 2019-04-21 NOTE — Telephone Encounter (Signed)
Last OV 03/25/19 with Dr. Jerline Pain Last refill Lorazepam 02/25/19 #30/0                 K-Dur 1 month ago by historical provider Next OV not scheduled  Forwarding to Dr. Juleen China

## 2019-04-21 NOTE — Telephone Encounter (Signed)
See note, patient is scheduled with Dr. Jerline Pain today at 3:40. PCP full.

## 2019-04-22 ENCOUNTER — Ambulatory Visit: Payer: Medicare HMO | Admitting: Physician Assistant

## 2019-04-26 ENCOUNTER — Other Ambulatory Visit (INDEPENDENT_AMBULATORY_CARE_PROVIDER_SITE_OTHER): Payer: Medicare HMO

## 2019-04-26 DIAGNOSIS — R5383 Other fatigue: Secondary | ICD-10-CM

## 2019-04-26 LAB — CBC
HCT: 33.3 % — ABNORMAL LOW (ref 36.0–46.0)
Hemoglobin: 11.5 g/dL — ABNORMAL LOW (ref 12.0–15.0)
MCHC: 34.5 g/dL (ref 30.0–36.0)
MCV: 95.3 fl (ref 78.0–100.0)
Platelets: 228 10*3/uL (ref 150.0–400.0)
RBC: 3.5 Mil/uL — ABNORMAL LOW (ref 3.87–5.11)
RDW: 12.9 % (ref 11.5–15.5)
WBC: 7.7 10*3/uL (ref 4.0–10.5)

## 2019-04-26 LAB — TSH: TSH: 1.75 u[IU]/mL (ref 0.35–4.50)

## 2019-04-26 LAB — COMPREHENSIVE METABOLIC PANEL
ALT: 12 U/L (ref 0–35)
AST: 20 U/L (ref 0–37)
Albumin: 3.9 g/dL (ref 3.5–5.2)
Alkaline Phosphatase: 49 U/L (ref 39–117)
BUN: 25 mg/dL — ABNORMAL HIGH (ref 6–23)
CO2: 29 mEq/L (ref 19–32)
Calcium: 9.4 mg/dL (ref 8.4–10.5)
Chloride: 99 mEq/L (ref 96–112)
Creatinine, Ser: 1.25 mg/dL — ABNORMAL HIGH (ref 0.40–1.20)
GFR: 40.4 mL/min — ABNORMAL LOW (ref >60.00)
Glucose, Bld: 113 mg/dL — ABNORMAL HIGH (ref 70–99)
Potassium: 5.6 mEq/L — ABNORMAL HIGH (ref 3.5–5.1)
Sodium: 134 mEq/L — ABNORMAL LOW (ref 135–145)
Total Bilirubin: 0.4 mg/dL (ref 0.2–1.2)
Total Protein: 6.3 g/dL (ref 6.0–8.3)

## 2019-04-26 LAB — VITAMIN B12: Vitamin B-12: 544 pg/mL (ref 211–911)

## 2019-04-27 ENCOUNTER — Other Ambulatory Visit: Payer: Self-pay

## 2019-04-27 DIAGNOSIS — E875 Hyperkalemia: Secondary | ICD-10-CM

## 2019-04-27 NOTE — Progress Notes (Signed)
Please inform patient of the following:  Her potassium was elevated. Everything else was stable or normal.  Would like for her to stop taking her potassium and have levels rechecked in 1-2 weeks. Recommend follow up with PCP soon.  Algis Greenhouse. Jerline Pain, MD 04/27/2019 8:04 AM

## 2019-05-04 ENCOUNTER — Other Ambulatory Visit: Payer: Self-pay | Admitting: Endocrinology

## 2019-05-05 ENCOUNTER — Ambulatory Visit (INDEPENDENT_AMBULATORY_CARE_PROVIDER_SITE_OTHER): Payer: Medicare HMO | Admitting: Family Medicine

## 2019-05-05 ENCOUNTER — Telehealth: Payer: Self-pay | Admitting: Family Medicine

## 2019-05-05 VITALS — BP 136/76 | HR 67

## 2019-05-05 DIAGNOSIS — M25541 Pain in joints of right hand: Secondary | ICD-10-CM | POA: Diagnosis not present

## 2019-05-05 DIAGNOSIS — M542 Cervicalgia: Secondary | ICD-10-CM

## 2019-05-05 DIAGNOSIS — M25542 Pain in joints of left hand: Secondary | ICD-10-CM | POA: Diagnosis not present

## 2019-05-05 DIAGNOSIS — H5789 Other specified disorders of eye and adnexa: Secondary | ICD-10-CM | POA: Diagnosis not present

## 2019-05-05 DIAGNOSIS — R51 Headache: Secondary | ICD-10-CM | POA: Diagnosis not present

## 2019-05-05 DIAGNOSIS — M1990 Unspecified osteoarthritis, unspecified site: Secondary | ICD-10-CM

## 2019-05-05 DIAGNOSIS — R519 Headache, unspecified: Secondary | ICD-10-CM

## 2019-05-05 MED ORDER — CELECOXIB 50 MG PO CAPS: 50.0000 mg | ORAL_CAPSULE | Freq: Two times a day (BID) | ORAL | 0 refills | Status: DC

## 2019-05-05 MED ORDER — OLOPATADINE HCL 0.2 % OP SOLN: OPHTHALMIC | 0 refills | Status: AC

## 2019-05-05 NOTE — Telephone Encounter (Signed)
Pt is scheduled for 4pm this evening but in is a lot of pain and would like something called to the pharmacy prior to appt. Please advise.

## 2019-05-05 NOTE — Progress Notes (Signed)
    Chief Complaint:  Vanessa Gibson is a 85 y.o. female who presents for a telephone visit with a chief complaint of headache.   Assessment/Plan:  Headache/neck pain/multiple joint pain Likely secondary to underlying osteoarthritis.  Will start low-dose Celebrex as needed.  She can continue using Tylenol.  Would avoid narcotics at this time given her benzo use.  May need referral to sports medicine if no improvement with this.  Eye irritation Possible allergic conjunctivitis.  No red flags.  Recommended starting Pataday drops.  Discussed reasons to return to care and seek emergent care.    Subjective:  HPI:  Headache  Several year history.  Has underlying history of cervical disc disease. She has some associated arm and foot pain as well.  She has tried taking Tylenol which has not helped.  Symptoms seem to be worsening over the last few weeks.  No reported weakness or numbness.  No reported bowel or bladder incontinence.   Eye irritation Started a few days ago.  Thinks it possibly could be allergies.  No specific treatments tried.  No vision changes.  No eye pain.  ROS: Per HPI  PMH: She reports that she has never smoked. She has never used smokeless tobacco. She reports that she does not drink alcohol or use drugs.      Objective/Observations   NAD. Speaking in full sentences.   Telephone Visit   I connected with Vanessa Gibson on 05/05/19 at  4:00 PM EDT via telephone and verified that I am speaking with the correct person using two identifiers. I discussed the limitations of evaluation and management by telemedicine and the availability of in person appointments. The patient expressed understanding and agreed to proceed.   Patient location: Home Provider location: [place] participating in the virtual visit: Myself and Patient   A total of 21 minutes were spent on medical discussion.      Algis Greenhouse. Jerline Pain, MD 05/05/2019 4:03 PM

## 2019-05-06 ENCOUNTER — Telehealth: Payer: Self-pay | Admitting: Endocrinology

## 2019-05-06 NOTE — Telephone Encounter (Signed)
Patient called to find out what medications are covered by her insurance,  I explained how to contact member services for her insurance to find out about coverage

## 2019-05-09 NOTE — Progress Notes (Signed)
Patient ID: Vanessa Gibson, female   DOB: Jun 14, 1928, 85 y.o.   MRN: [id number]           Reason for Appointment: Follow-up for Type 2 Diabetes  Today's office visit was provided via telemedicine using a telephone call to the patient Patient has been explained the limitations of evaluation and management by telemedicine and the availability of in person appointments.  The patient understood the limitations and agreed to proceed. Patient also understood that the telehealth visit is billable. . Location of the patient: Home . Location of the provider: Office Only the patient and myself were participating in the encounter  Referring physician: Drema Dallas   History of Present Illness:          Date of diagnosis of type 2 diabetes mellitus: 1997        Background history:   She had been treated with metformin mostly since her diagnosis At some point was given glipizide/metformin combination, detailed history is not available from PCP Also not clear what level of control she has had until recently  Recent history:   Non-insulin hypoglycemic drugs the patient is taking are: none   INSULIN dose: Tresiba 20 units at night,  Novolog 6 units at breakfast and 8 at supper  Her last A1c was 7.6 done in February  Had been seen in the office in 05/2018  Current management, blood sugar patterns and problems identified:   He says her blood sugars have been fairly consistent more recently  This is despite no change in her insulin  She said that she does not like the food she is getting at her assisted living facility she is generally eating most of her  Not clear if she is getting healthy meals but she thinks she is now getting 3 meals a day  She really does not think she is eating much carbohydrate at lunchtime NovoLog  Has not had any hypoglycemia for several weeks  Again she does not appear to be adjusting her insulin doses as directed, she was told on her telephone visit in March to  reduce her suppertime dose to 6 units  However her blood sugars are not getting low in the evenings after dinner from her history and she thinks she checks them regularly  She does not know if her weight has changed but most likely the same  She does not do any walking or exercise because of fatigue, leg pains        Side effects from medications have been: ?  Nausea from metformin  Compliance with the medical regimen: Fairly good  Hypoglycemia: None    Glucose monitoring:  done 1-2 times a day         Glucometer:  Accu-Chek  Blood Glucose readings from meter  Only blood sugars for the last week below  PRE-MEAL Fasting Lunch Dinner Bedtime Overall  Glucose range:  102-138   109  83-164   Mean/median:      ?   Previous readings:  PRE-MEAL Fasting Lunch Dinner Bedtime Overall  Glucose range:  93-172    123-310   Mean/median:  123    172 148     Self-care: The diet that the patient has been following is: tries to limit sweets  She has her meals with assisted living facility     Typical meal intake: Breakfast is cereal, sometimes bacon.  Lunch: yogurt, soup, cottage cheese or peanut butter with apple, or other light meal usually  Dietician visit, most recent: Never CDE visit: 1/18                Weight history: Previous range 98-140   Wt Readings from Last 3 Encounters:  02/25/19 121 lb (54.9 kg)  12/27/18 125 lb 3.2 oz (56.8 kg)  12/13/18 125 lb 6.4 oz (56.9 kg)    Glycemic control:  A1c in October 2017 = 8.5    Lab Results  Component Value Date   HGBA1C 7.6 (H) 12/27/2018   HGBA1C 6.8 (A) 05/26/2018   HGBA1C 7.5 12/30/2017   Lab Results  Component Value Date   MICROALBUR <0.7 11/14/2016   LDLCALC 71 03/17/2018   CREATININE 1.25 (H) 04/26/2019   Lab Results  Component Value Date   MICRALBCREAT 1.8 11/14/2016       Allergies as of 05/10/2019   No Known Allergies     Medication List       Accurate as of May 09, 2019  9:34 PM. If  you have any questions, ask your nurse or doctor.        acetaminophen 325 MG tablet Commonly known as: TYLENOL Take 2 tablets (650 mg total) by mouth every 6 (six) hours as needed for moderate pain or headache.   aspirin EC 81 MG tablet Take 81 mg by mouth daily.   azelastine 0.1 % nasal spray Commonly known as: ASTELIN Place 1 spray into both nostrils 2 (two) times daily. Use in each nostril as directed   BD Pen Needle Nano U/F 32G X 4 MM Misc Generic drug: Insulin Pen Needle USE TO INJECT INSULINS 4 TIMES DAILY.   celecoxib 50 MG capsule Commonly known as: CeleBREX Take 1 capsule (50 mg total) by mouth 2 (two) times daily.   citalopram 10 MG tablet Commonly known as: CELEXA Take 1 tablet (10 mg total) by mouth daily.   fluticasone 50 MCG/ACT nasal spray Commonly known as: FLONASE Place 2 sprays into both nostrils daily.   furosemide 20 MG tablet Commonly known as: LASIX TAKE 1 TABLET ONCE DAILY.   insulin aspart 100 UNIT/ML FlexPen Commonly known as: NovoLOG FlexPen Inject 3 units before breakfast and 4 units before supper What changed: additional instructions   insulin degludec 100 UNIT/ML Sopn FlexTouch Pen Commonly known as: [place] Aas FlexTouch Inject 20 units into the skin once daily   LORazepam 0.5 MG tablet Commonly known as: ATIVAN TAKE ONE TABLET AT BEDTIME.   lovastatin 20 MG tablet Commonly known as: MEVACOR Take 1 tablet (20 mg total) by mouth at bedtime.   metoprolol tartrate 25 MG tablet Commonly known as: LOPRESSOR TAKE 1 TABLET TWICE DAILY   montelukast 10 MG tablet Commonly known as: SINGULAIR Take 1 tablet (10 mg total) by mouth at bedtime.   multivitamin-lutein Caps capsule Take 1 capsule by mouth daily.   HAIR/SKIN/NAILS/BIOTIN PO Take 1 tablet by mouth daily.   Centrum Silver 50+Women Tabs Take 1 tablet by mouth daily.   Olopatadine HCl 0.2 % Soln Commonly known as: Pataday Apply 1 drop to each eye once daily.   omeprazole  40 MG capsule Commonly known as: PRILOSEC Take 1 capsule by mouth daily.   potassium chloride 10 MEQ tablet Commonly known as: K-DUR TAKE 1 TABLET EACH DAY.   vitamin C 1000 MG tablet Take 1,000 mg by mouth daily.       Allergies: No Known Allergies  Past Medical History:  Diagnosis Date  . Anxiety   . Arthritis   . Asthma   .  Breast cancer ([place]), bilateral   . Chronic pancreatitis ([place])   . Depression   . Dizziness   . GERD (gastroesophageal reflux disease)   . History of hiatal hernia   . Hyperlipemia   . Hypertension   . IBS (irritable bowel syndrome)   . Mandibular fracture ([place]) 12/04/2015  . MI (myocardial infarction) ([place])   . Skin cancer of face     Past Surgical History:  Procedure Laterality Date  . BREAST SURGERY    . CATARACT EXTRACTION W/ INTRAOCULAR LENS  IMPLANT, BILATERAL Bilateral   . CHOLECYSTECTOMY OPEN  1970's  . MASTECTOMY Bilateral     No family history on file.  Social History:  reports that she has never smoked. She has never used smokeless tobacco. She reports that she does not drink alcohol or use drugs.   Review of Systems   Lipid history:  She is on lovastatin 20 mg from her PCP and is overdue for follow-up    Lab Results  Component Value Date   CHOL 146 03/17/2018   HDL 52.30 03/17/2018   LDLCALC 71 03/17/2018   TRIG 114.0 03/17/2018   CHOLHDL 3 03/17/2018           Hypertension:  She is only on metoprolol 25 mg, Has history of CAD  HYPERKALEMIA: She has had this periodically, due to see PCP   Lab Results  Component Value Date   CREATININE 1.25 (H) 04/26/2019   BUN 25 (H) 04/26/2019   NA 134 (L) 04/26/2019   K 5.6 (H) 04/26/2019   CL 99 04/26/2019   CO2 29 04/26/2019    Most recent eye exam was 07/2016, reportedly no retinopathy  Most recent foot exam: 11/05/17  She has had chronic tingling and discomfort in her hands Complaining of pain in her legs especially left and headaches, due to see PCP     Physical Examination:  LMP  (LMP Unknown)      ASSESSMENT:  Diabetes type 2, uncontrolled, nonobese, insulin-requiring  See history of present illness for detailed discussion of current diabetes management, blood sugar patterns and problems identified   She is on basal bolus insulin regimen with failure to respond to oral agents  Her A1c was last 7.6 which is still adequate for her age  However her blood sugars at home look excellent without unusually high readings or hypoglycemia She is compliant with her insulin including NovoLog before breakfast and dinner Discussed blood sugar target Also reminded her that if her sugars are low normal at bedtime will need to reduce her evening dose of NovoLog to 6 units as discussed on the previous visit  Other active problems: She needs to follow-up with her PCP for various symptoms and recurrent hyperkalemia Etiology of her hyperkalemia has been unclear but she likely needs to avoid beta-blockers and nonsteroidal anti-inflammatory drugs  PLAN:   Continue same regimen of insulin To check readings periodically after breakfast also She will call if she has any tendency to low sugars To have A1c when she can come in for labs  There are no Patient Instructions on file for this visit.  Duration of telephone call =7 minutes     Elayne Snare 05/09/2019, 9:34 PM   Note: This office note was prepared with Dragon voice recognition system technology. Any transcriptional errors that result from this process are unintentional.

## 2019-05-10 ENCOUNTER — Other Ambulatory Visit: Payer: Self-pay

## 2019-05-10 ENCOUNTER — Encounter: Payer: Self-pay | Admitting: Endocrinology

## 2019-05-10 ENCOUNTER — Ambulatory Visit (INDEPENDENT_AMBULATORY_CARE_PROVIDER_SITE_OTHER): Payer: Medicare HMO | Admitting: Endocrinology

## 2019-05-10 ENCOUNTER — Ambulatory Visit: Payer: Medicare HMO | Admitting: Endocrinology

## 2019-05-10 DIAGNOSIS — E1165 Type 2 diabetes mellitus with hyperglycemia: Secondary | ICD-10-CM

## 2019-05-10 DIAGNOSIS — Z794 Long term (current) use of insulin: Secondary | ICD-10-CM

## 2019-05-11 ENCOUNTER — Ambulatory Visit (INDEPENDENT_AMBULATORY_CARE_PROVIDER_SITE_OTHER): Payer: Medicare HMO | Admitting: Family Medicine

## 2019-05-11 ENCOUNTER — Other Ambulatory Visit: Payer: Self-pay

## 2019-05-11 VITALS — BP 114/61 | HR 69 | Ht 59.0 in | Wt 121.0 lb

## 2019-05-11 DIAGNOSIS — Z794 Long term (current) use of insulin: Secondary | ICD-10-CM

## 2019-05-11 DIAGNOSIS — E538 Deficiency of other specified B group vitamins: Secondary | ICD-10-CM

## 2019-05-11 DIAGNOSIS — F339 Major depressive disorder, recurrent, unspecified: Secondary | ICD-10-CM | POA: Diagnosis not present

## 2019-05-11 DIAGNOSIS — E1142 Type 2 diabetes mellitus with diabetic polyneuropathy: Secondary | ICD-10-CM | POA: Diagnosis not present

## 2019-05-11 DIAGNOSIS — M810 Age-related osteoporosis without current pathological fracture: Secondary | ICD-10-CM | POA: Diagnosis not present

## 2019-05-11 DIAGNOSIS — Z9181 History of falling: Secondary | ICD-10-CM | POA: Diagnosis not present

## 2019-05-11 DIAGNOSIS — R5381 Other malaise: Secondary | ICD-10-CM

## 2019-05-11 DIAGNOSIS — J3089 Other allergic rhinitis: Secondary | ICD-10-CM | POA: Diagnosis not present

## 2019-05-11 DIAGNOSIS — E1165 Type 2 diabetes mellitus with hyperglycemia: Secondary | ICD-10-CM

## 2019-05-11 DIAGNOSIS — R5382 Chronic fatigue, unspecified: Secondary | ICD-10-CM | POA: Diagnosis not present

## 2019-05-11 DIAGNOSIS — H9193 Unspecified hearing loss, bilateral: Secondary | ICD-10-CM

## 2019-05-11 DIAGNOSIS — E43 Unspecified severe protein-calorie malnutrition: Secondary | ICD-10-CM | POA: Diagnosis not present

## 2019-05-11 DIAGNOSIS — R262 Difficulty in walking, not elsewhere classified: Secondary | ICD-10-CM

## 2019-05-11 NOTE — Progress Notes (Signed)
Virtual Visit via Video   Due to the COVID-19 pandemic, this visit was completed with telemedicine (audio/video) technology to reduce patient and provider exposure as well as to preserve personal protective equipment.   I connected with Jakari Steinfeldt by a video enabled telemedicine application and verified that I am speaking with the correct person using two identifiers. Location patient: Home Location provider: [facility], Office Persons participating in the virtual visit: Autumm Demorest, Briscoe Deutscher, DO [place], [place] acting as scribe for Dr. Briscoe Deutscher.   I discussed the limitations of evaluation and management by telemedicine and the availability of in person appointments. The patient expressed understanding and agreed to proceed.  Care Team   Patient Care Team: Briscoe Deutscher, DO as PCP - General (Family Medicine) Jerline Pain, MD as PCP - Cardiology (Cardiology) Zadie Rhine Clent Demark, MD as Consulting Physician (Ophthalmology)  Subjective:   HPI: Patient is having increased fatigue and having leg pain. She was started on Celebrex and has started helping a little with leg pain. She feels like she is very weak. She is falling asleep in her chair and that is not something that she has done in the past.   She still believes that the source of all of her issues are mold in her apartment. No family in town. Depressed, see previous notes.   Now interested in hearing aids, which is a new positive request.   Review of Systems  Constitutional: Negative for chills and fever.  HENT: Negative for hearing loss and tinnitus.   Eyes: Negative for blurred vision and double vision.  Respiratory: Negative for cough and wheezing.   Cardiovascular: Negative for chest pain, palpitations and leg swelling.  Gastrointestinal: Negative for heartburn and nausea.  Genitourinary: Negative for dysuria, frequency and urgency.  Musculoskeletal: Positive for myalgias and neck pain.  Skin: Negative for  rash.  Neurological: Positive for dizziness and weakness. Negative for tingling and headaches.    Patient Active Problem List   Diagnosis Date Noted  . Bilateral hand numbness 10/01/2018  . Neck pain 10/01/2018  . Bilateral lower extremity edema 06/18/2018  . Primary osteoarthritis of both knees 06/18/2018  . Arthralgia of both hands 06/18/2018  . Chronic cough 06/18/2018  . Hernia of abdominal wall 06/18/2018  . ILD (interstitial lung disease) ([place]) 04/05/2018  . Depression, recurrent ([place]), unhappy in [place] but feels stuck, declines medication or counseling 01/24/2018  . At risk for polypharmacy, due to vitamin overuse, working with patient to "deprescribe" 01/24/2018  . Constipation, uses Miralax prn 01/24/2018  . Diabetic peripheral neuropathy associated with type 2 diabetes mellitus ([place]), unable to tolerate Neurontin 01/24/2018  . Aortic atherosclerosis ([place]), 2018 CXR 01/24/2018  . Lumbar spine scoliosis 01/24/2018  . Hyperlipidemia associated with type 2 diabetes mellitus ([place]), on statin 01/24/2018  . IBS (irritable bowel syndrome) 01/24/2018  . Uncontrolled type 2 diabetes mellitus with hyperglycemia, with long-term current use of insulin ([place]) 12/02/2017  . Insomnia 06/06/2017  . CKD stage 3 secondary to diabetes ([place]), on Lisinopril 05/26/2017  . Osteoporosis, post-menopausal 02/06/2016  . Bilateral hearing loss, refuses hearing aids or further testing 02/06/2016  . Arthritis, takes Tumeric  02/06/2016  . Anemia of chronic disease 12/05/2015  . Protein-calorie malnutrition, weight stable 11/13/2015  . Chronic pancreatitis ([place]) 11/13/2015  . GERD (gastroesophageal reflux disease), on Nexium 11/13/2015  . Hypertension associated with diabetes ([place]), on Lisinopril 11/13/2015  . Allergic rhinitis, Rx Xyzal, flonase, atrovent and has been offered referral to Allergy 11/13/2015  Social History   Tobacco Use  . Smoking status: Never Smoker  . Smokeless tobacco: Never Used    Substance Use Topics  . Alcohol use: No    Current Outpatient Medications:  .  acetaminophen (TYLENOL) 325 MG tablet, Take 2 tablets (650 mg total) by mouth every 6 (six) hours as needed for moderate pain or headache., Disp: , Rfl:  .  Ascorbic Acid (VITAMIN C) 1000 MG tablet, Take 1,000 mg by mouth daily., Disp: , Rfl:  .  aspirin EC 81 MG tablet, Take 81 mg by mouth daily., Disp: , Rfl:  .  azelastine (ASTELIN) 0.1 % nasal spray, Place 1 spray into both nostrils 2 (two) times daily. Use in each nostril as directed, Disp: 30 mL, Rfl: 12 .  celecoxib (CELEBREX) 50 MG capsule, Take 1 capsule (50 mg total) by mouth 2 (two) times daily., Disp: 60 capsule, Rfl: 0 .  citalopram (CELEXA) 10 MG tablet, Take 1 tablet (10 mg total) by mouth daily., Disp: 90 tablet, Rfl: 1 .  fluticasone (FLONASE) 50 MCG/ACT nasal spray, Place 2 sprays into both nostrils daily., Disp: 16 g, Rfl: 0 .  furosemide (LASIX) 20 MG tablet, TAKE 1 TABLET ONCE DAILY., Disp: 90 tablet, Rfl: 0 .  LORazepam (ATIVAN) 0.5 MG tablet, TAKE ONE TABLET AT BEDTIME., Disp: 30 tablet, Rfl: 0 .  lovastatin (MEVACOR) 20 MG tablet, Take 1 tablet (20 mg total) by mouth at bedtime., Disp: 90 tablet, Rfl: 3 .  metoprolol tartrate (LOPRESSOR) 25 MG tablet, TAKE 1 TABLET TWICE DAILY, Disp: 180 tablet, Rfl: 2 .  montelukast (SINGULAIR) 10 MG tablet, Take 1 tablet (10 mg total) by mouth at bedtime., Disp: 90 tablet, Rfl: 0 .  Multiple Vitamins-Minerals (CENTRUM SILVER 50+WOMEN) TABS, Take 1 tablet by mouth daily., Disp: , Rfl:  .  Multiple Vitamins-Minerals (HAIR/SKIN/NAILS/BIOTIN PO), Take 1 tablet by mouth daily., Disp: , Rfl:  .  multivitamin-lutein (OCUVITE-LUTEIN) CAPS capsule, Take 1 capsule by mouth daily., Disp: , Rfl:  .  Olopatadine HCl (PATADAY) 0.2 % SOLN, Apply 1 drop to each eye once daily., Disp: 2.5 mL, Rfl: 0 .  omeprazole (PRILOSEC) 40 MG capsule, Take 1 capsule by mouth daily., Disp: , Rfl:  .  potassium chloride (K-DUR) 10 MEQ  tablet, TAKE 1 TABLET EACH DAY., Disp: 30 tablet, Rfl: 0 .  Alcohol Swabs (B-D SINGLE USE SWABS REGULAR) PADS, USE TO CLEAN FINGER BEFORE CHECKING BLOOD SUGARS AND CLEAN SKIN BEFORE INSULIN INJECTIONS., Disp: 300 each, Rfl: 3 .  DROPLET PEN NEEDLES 32G X 4 MM MISC, USE  TO  INJECT  INSULIN EVERY DAY, Disp: 100 each, Rfl: 3 .  Insulin Aspart FlexPen 100 UNIT/ML SOPN, Inject 3-4 Units into the skin 2 (two) times a day., Disp: 5 pen, Rfl: 3 .  TRESIBA FLEXTOUCH 100 UNIT/ML SOPN FlexTouch Pen, INJECT 20 UNITS SUBCUTANEOUSLY ONE TIME DAILY, Disp: 30 mL, Rfl: 3  No Known Allergies  Objective:   VITALS: Per patient if applicable, see vitals. GENERAL: Alert, appears well and in no acute distress. HEENT: Atraumatic, conjunctiva clear, no obvious abnormalities on inspection of external nose and ears. NECK: Normal movements of the head and neck. CARDIOPULMONARY: No increased WOB. Speaking in clear sentences. I:E ratio WNL.  MS: Moves all visible extremities without noticeable abnormality. PSYCH: Pleasant and cooperative, well-groomed. Speech normal rate and rhythm. Affect is appropriate. Insight and judgement are appropriate. Attention is focused, linear, and appropriate.  NEURO: CN grossly intact. Oriented as arrived to appointment on time with  no prompting. Moves both UE equally.  SKIN: No obvious lesions, wounds, erythema, or cyanosis noted on face or hands.  Depression screen [facility] 2/9 06/18/2018 08/12/2017 05/18/2017  Decreased Interest 3 2 2   Down, Depressed, Hopeless 2 2 2   PHQ - 2 Score 5 4 4   Altered sleeping 2 3 3   Tired, decreased energy 0 3 3  Change in appetite 0 1 2  Feeling bad or failure about yourself  3 2 2   Trouble concentrating 2 3 3   Moving slowly or fidgety/restless 2 - 1  Suicidal thoughts 0 0 0  PHQ-9 Score 14 16 18   Difficult doing work/chores - Very difficult Very difficult    Assessment and Plan:   Judine was seen today for follow-up.  Diagnoses and all orders for  this visit:  Diabetic peripheral neuropathy associated with type 2 diabetes mellitus ([place]), unable to tolerate Neurontin  Allergic rhinitis due to other allergic trigger, unspecified seasonality  Depression, recurrent ([place]), unhappy in [place] but feels stuck, declines medication or counseling  Uncontrolled type 2 diabetes mellitus with hyperglycemia, with long-term current use of insulin ([place]) -     Hemoglobin A1c; Future  Osteoporosis, post-menopausal  Bilateral hearing loss, unspecified hearing loss type  Protein-calorie malnutrition, weight stable  Chronic fatigue and malaise -     CBC with Differential/Platelet; Future -     Comprehensive metabolic panel; Future -     TSH; Future -     Vitamin B12; Future -     Magnesium; Future  At risk for falling -     Ambulatory referral to Physical Therapy  Difficulty walking -     Ambulatory referral to Physical Therapy  B12 deficiency -     Vitamin B12; Future    . COVID-19 Education: The signs and symptoms of COVID-19 were discussed with the patient and how to seek care for testing if needed. The importance of social distancing was discussed today. . Reviewed expectations re: course of current medical issues. . Discussed self-management of symptoms. . Outlined signs and symptoms indicating need for more acute intervention. . Patient verbalized understanding and all questions were answered. Marland Kitchen Health Maintenance issues including appropriate healthy diet, exercise, and smoking avoidance were discussed with patient. . See orders for this visit as documented in the electronic medical record.  Briscoe Deutscher, DO  Records requested if needed. Time spent: 25 minutes, of which >50% was spent in obtaining information about her symptoms, reviewing her previous labs, evaluations, and treatments, counseling her about her condition (please see the discussed topics above), and developing a plan to further investigate it; she had a number of  questions which I addressed.

## 2019-05-12 ENCOUNTER — Telehealth: Payer: Self-pay | Admitting: Family Medicine

## 2019-05-12 NOTE — Telephone Encounter (Signed)
We were going to call to arrange to have her come in for PT - weakness.

## 2019-05-12 NOTE — Telephone Encounter (Signed)
So not see any message do you know why she would be getting call?

## 2019-05-12 NOTE — Telephone Encounter (Signed)
Can you call for PT?

## 2019-05-12 NOTE — Telephone Encounter (Signed)
Copied from [place] [id number]. Topic: General - Other >> May 11, 2019  3:59 PM Nils Flack wrote: Reason for CRM: pt is retunring call, no crm  Please call back

## 2019-05-12 NOTE — Telephone Encounter (Signed)
Did not see referral in for PT

## 2019-05-13 ENCOUNTER — Ambulatory Visit: Payer: Self-pay

## 2019-05-13 NOTE — Telephone Encounter (Signed)
Called patient and let her know all information. She has app will keep log of bp's and will make changes in meds.

## 2019-05-13 NOTE — Telephone Encounter (Signed)
Pt. Called to report concerns about lower blood pressure readings 114/61 and 119/57.  Reported she has felt weak for quite awhile.  C/o feeling light-headed, and tired.  Stated she is concerned that the BP is too low.  Reported she takes Metoprolol and was on 2 / day; then was recommended to take one/ day.  Stated she is concerned that her BP used to run 130's / 70's, and now it is a lot lower.  Reported she started on Celebrex on 6/11 for inflammation, and questioned if this could be causing her to be light-headed and feeling so tired?  Voiced concern about going into the weekend, feeling this way.  Questioned if she had any other symptoms; stated it feels like her heart rate will increase, and then slow down at times.   Denied having any sensation that heart is beating fast at present time.  Denied any chest pain or shortness of breath.  Offered that pt. could be scheduled for appt for eval. of sxs.  Stated she has an appt. next Wed.  In recent progress note on 6/17, noted that her c/o weakness, fatigue, and dizziness were documented.  Care advice given per protocol.  Advised will send Triage note to Dr. Juleen China to make aware of her continues symptoms and concerns.  Agreed with plan.      Reason for Disposition . [1] Systolic BP 61-096 AND [0] taking blood pressure medications AND [3] dizzy, lightheaded or weak    Will call office and report c/o light headed feeling, and pt's continued c/o weakness and fatigue.  Had virtual visit on 6/17 with PCP.  Answer Assessment - Initial Assessment Questions 1. BLOOD PRESSURE: "What is the blood pressure?" "Did you take at least two measurements 5 minutes apart?"     119/57 @ 10:30 AM on 6/19; 114/61 on 6/17 (gets BP checked 2x/ week)  2. ONSET: "When did you take your blood pressure?"     See above  3. HOW: "How did you obtain the blood pressure?" (e.g., visiting nurse, automatic home BP monitor)     Taken with wrist cuff 4. HISTORY: "Do you have a history of  low blood pressure?" "What is your blood pressure normally?"    BP used to be in 130/ 76 range  5. MEDICATIONS: "Are you taking any medications for blood pressure?" If yes: "Have they been changed recently?"     Takes Metoprolol; is unsure about the dose.; should she take one or two/ day 6. PULSE RATE: "Do you know what your pulse rate is?"      67-69 7. OTHER SYMPTOMS: "Have you been sick recently?" "Have you had a recent injury?"     Stated she feels more weak; c/o light headed; feels more sleepy; feels like her heart beat gets a little fast and then it slows down. Denied any fast heart beat at this time.  Denied shortness of breath.   8. PREGNANCY: "Is there any chance you are pregnant?" "When was your last menstrual period?"    N/a  Protocols used: LOW BLOOD PRESSURE-A-AH

## 2019-05-13 NOTE — Telephone Encounter (Signed)
Spoke to Pratt from [facility] and walked over to Dr. Alcario Drought pod to make them aware of high priority triage note.  JoEllen asked me to route message directly to Dr. Juleen China which I did.

## 2019-05-13 NOTE — Telephone Encounter (Signed)
Hold Celebrex. Decrease Metoprolol to 12.5 mg BID. Keep monitoring vitals. In office visit in 1 week.

## 2019-05-16 ENCOUNTER — Other Ambulatory Visit: Payer: Self-pay | Admitting: Endocrinology

## 2019-05-17 ENCOUNTER — Telehealth: Payer: Self-pay

## 2019-05-17 ENCOUNTER — Encounter: Payer: Self-pay | Admitting: Family Medicine

## 2019-05-17 NOTE — Telephone Encounter (Signed)
Okay to let patient know that I have been tested for COVID today and will be out for the rest of the week. Offer to have her see another provider. She needs PT. Okay to get that started.

## 2019-05-17 NOTE — Telephone Encounter (Signed)
I spoke with patient to reschedule her appointment and she was not happy with having to reschedule.  I offered patient to see  another provider and she refused,  Patient stated that she really wanted to see Dr. Juleen China and wanted to know why she was out.  I informed patient that Dr. Juleen China absence was unexpected.  Patient did not take the offer to reschedule.  Patient c/o lightheadedness, dizziness and pain in her arms and legs.  Patient asked if Dr. Juleen China could prescribe her something today.  I informed patient that I would send a message to Dr. Juleen China about complaints.

## 2019-05-18 ENCOUNTER — Ambulatory Visit: Payer: Medicare HMO | Admitting: Family Medicine

## 2019-05-18 NOTE — Telephone Encounter (Signed)
Called patient did not want PT put in to see [facility] Monday in a 32min app.

## 2019-05-18 NOTE — Telephone Encounter (Signed)
I spoke with patient this morning and informed her of Dr. Alcario Drought response.  Patient refused referral for the PT.  Patient said that she is just going to use hot and cold compresses on her neck area. Patient still refuses the offers to see a different provider.  Patient is asking for rx for pain as she said that Tylenol is not working for her.  She would like to have something for her leg and arms.  I explained to her that I would let Dr. Juleen China know and get back to her.

## 2019-05-18 NOTE — Telephone Encounter (Signed)
Sorry, no pain prescription, especially without evaluation in office or with PT. Too sedating and she is at risk for fall.

## 2019-05-23 ENCOUNTER — Other Ambulatory Visit: Payer: Self-pay | Admitting: Family Medicine

## 2019-05-23 ENCOUNTER — Other Ambulatory Visit: Payer: Self-pay

## 2019-05-23 ENCOUNTER — Other Ambulatory Visit: Payer: Self-pay | Admitting: *Deleted

## 2019-05-23 ENCOUNTER — Encounter: Payer: Self-pay | Admitting: Physician Assistant

## 2019-05-23 ENCOUNTER — Ambulatory Visit (INDEPENDENT_AMBULATORY_CARE_PROVIDER_SITE_OTHER): Payer: Medicare HMO | Admitting: Physician Assistant

## 2019-05-23 VITALS — BP 128/68 | HR 65 | Temp 98.7°F | Ht 59.0 in | Wt 121.2 lb

## 2019-05-23 DIAGNOSIS — E875 Hyperkalemia: Secondary | ICD-10-CM | POA: Diagnosis not present

## 2019-05-23 DIAGNOSIS — H547 Unspecified visual loss: Secondary | ICD-10-CM | POA: Diagnosis not present

## 2019-05-23 DIAGNOSIS — R5381 Other malaise: Secondary | ICD-10-CM

## 2019-05-23 DIAGNOSIS — M1990 Unspecified osteoarthritis, unspecified site: Secondary | ICD-10-CM | POA: Diagnosis not present

## 2019-05-23 DIAGNOSIS — E538 Deficiency of other specified B group vitamins: Secondary | ICD-10-CM

## 2019-05-23 DIAGNOSIS — K219 Gastro-esophageal reflux disease without esophagitis: Secondary | ICD-10-CM

## 2019-05-23 DIAGNOSIS — G4700 Insomnia, unspecified: Secondary | ICD-10-CM

## 2019-05-23 DIAGNOSIS — Z794 Long term (current) use of insulin: Secondary | ICD-10-CM | POA: Diagnosis not present

## 2019-05-23 DIAGNOSIS — R5382 Chronic fatigue, unspecified: Secondary | ICD-10-CM | POA: Diagnosis not present

## 2019-05-23 DIAGNOSIS — E1165 Type 2 diabetes mellitus with hyperglycemia: Secondary | ICD-10-CM | POA: Diagnosis not present

## 2019-05-23 LAB — CBC WITH DIFFERENTIAL/PLATELET
Basophils Absolute: 0.1 10*3/uL (ref 0.0–0.1)
Basophils Relative: 0.9 % (ref 0.0–3.0)
Eosinophils Absolute: 0.2 10*3/uL (ref 0.0–0.7)
Eosinophils Relative: 2.1 % (ref 0.0–5.0)
HCT: 34.9 % — ABNORMAL LOW (ref 36.0–46.0)
Hemoglobin: 11.7 g/dL — ABNORMAL LOW (ref 12.0–15.0)
Lymphocytes Relative: 26 % (ref 12.0–46.0)
Lymphs Abs: 2.5 10*3/uL (ref 0.7–4.0)
MCHC: 33.7 g/dL (ref 30.0–36.0)
MCV: 96.2 fl (ref 78.0–100.0)
Monocytes Absolute: 0.8 10*3/uL (ref 0.1–1.0)
Monocytes Relative: 8.7 % (ref 3.0–12.0)
Neutro Abs: 5.9 10*3/uL (ref 1.4–7.7)
Neutrophils Relative %: 62.3 % (ref 43.0–77.0)
Platelets: 265 10*3/uL (ref 150.0–400.0)
RBC: 3.62 Mil/uL — ABNORMAL LOW (ref 3.87–5.11)
RDW: 12.7 % (ref 11.5–15.5)
WBC: 9.4 10*3/uL (ref 4.0–10.5)

## 2019-05-23 LAB — HEMOGLOBIN A1C: Hgb A1c MFr Bld: 6.5 % (ref 4.6–6.5)

## 2019-05-23 MED ORDER — CELECOXIB 50 MG PO CAPS: 50.0000 mg | ORAL_CAPSULE | Freq: Two times a day (BID) | ORAL | 0 refills | Status: DC

## 2019-05-23 NOTE — Progress Notes (Signed)
Vanessa Gibson is a 85 y.o. female here for a new problem.  I acted as a Education administrator for [employer], PA-C Anselmo Pickler, LPN  History of Present Illness:   Chief Complaint  Patient presents with  . Neck Pain  . Leg Pain    HPI   Neck pain; Leg pain Pt c/o neck pain but is getting better since she has been putting cold compresses on her neck. Also doing exercises. Pt c/o right leg pain x 1 month off and on from hip to ankle, using heating pain on knee. She takes arthritis tylenol without relief. Has slight swelling in both ankles/legs. Admits that she recently got rid of her ottoman so she doesn't ever elevate her legs except when sleeping. Of note, she saw Dr. Jerline Pain on 05/05/19 and was prescribed celebrex -- she states that she has not taken this.  Denies: SOB, calf pain, erythema  She is also due for recheck of labs for her DM, fatigue, recent potassium elevation and B12 deficiency.  She also endorses decreased vision. States that she was seen for this by Dr. Jerline Pain during prior visit and was given pataday drops but she didn't find them effective.  Past Medical History:  Diagnosis Date  . Anxiety   . Arthritis   . Asthma   . Breast cancer ([place]), bilateral   . Chronic pancreatitis ([place])   . Depression   . Dizziness   . GERD (gastroesophageal reflux disease)   . History of hiatal hernia   . Hyperlipemia   . Hypertension   . IBS (irritable bowel syndrome)   . Mandibular fracture ([place]) 12/04/2015  . MI (myocardial infarction) ([place])   . Skin cancer of face      Social History   Socioeconomic History  . Marital status: Divorced    Spouse name: Not on file  . Number of children: Not on file  . Years of education: Not on file  . Highest education level: Not on file  Occupational History  . Not on file  Social Needs  . Financial resource strain: Not on file  . Food insecurity    Worry: Not on file    Inability: Not on file  . Transportation needs    Medical: Not on file     Non-medical: Not on file  Tobacco Use  . Smoking status: Never Smoker  . Smokeless tobacco: Never Used  Substance and Sexual Activity  . Alcohol use: No  . Drug use: No  . Sexual activity: Never  Lifestyle  . Physical activity    Days per week: Not on file    Minutes per session: Not on file  . Stress: Not on file  Relationships  . Social Herbalist on phone: Not on file    Gets together: Not on file    Attends religious service: Not on file    Active member of club or organization: Not on file    Attends meetings of clubs or organizations: Not on file    Relationship status: Not on file  . Intimate partner violence    Fear of current or ex partner: Not on file    Emotionally abused: Not on file    Physically abused: Not on file    Forced sexual activity: Not on file  Other Topics Concern  . Not on file  Social History Narrative   Lives at [facility], [place].    Caffeine use:     Past Surgical History:  Procedure Laterality Date  . BREAST SURGERY    . CATARACT EXTRACTION W/ INTRAOCULAR LENS  IMPLANT, BILATERAL Bilateral   . CHOLECYSTECTOMY OPEN  1970's  . MASTECTOMY Bilateral     History reviewed. No pertinent family history.  No Known Allergies  Current Medications:   Current Outpatient Medications:  .  acetaminophen (TYLENOL) 325 MG tablet, Take 2 tablets (650 mg total) by mouth every 6 (six) hours as needed for moderate pain or headache., Disp: , Rfl:  .  Alcohol Swabs (B-D SINGLE USE SWABS REGULAR) PADS, USE TO CLEAN FINGER BEFORE CHECKING BLOOD SUGARS AND CLEAN SKIN BEFORE INSULIN INJECTIONS., Disp: 300 each, Rfl: 3 .  Ascorbic Acid (VITAMIN C) 1000 MG tablet, Take 1,000 mg by mouth daily., Disp: , Rfl:  .  aspirin EC 81 MG tablet, Take 81 mg by mouth daily., Disp: , Rfl:  .  azelastine (ASTELIN) 0.1 % nasal spray, Place 1 spray into both nostrils 2 (two) times daily. Use in each nostril as directed, Disp: 30 mL, Rfl: 12 .   celecoxib (CELEBREX) 50 MG capsule, Take 1 capsule (50 mg total) by mouth 2 (two) times daily., Disp: 60 capsule, Rfl: 0 .  citalopram (CELEXA) 10 MG tablet, Take 1 tablet (10 mg total) by mouth daily., Disp: 90 tablet, Rfl: 1 .  DROPLET PEN NEEDLES 32G X 4 MM MISC, USE  TO  INJECT  INSULIN EVERY DAY, Disp: 100 each, Rfl: 3 .  fluticasone (FLONASE) 50 MCG/ACT nasal spray, Place 2 sprays into both nostrils daily., Disp: 16 g, Rfl: 0 .  furosemide (LASIX) 20 MG tablet, TAKE 1 TABLET ONCE DAILY., Disp: 90 tablet, Rfl: 0 .  Insulin Aspart FlexPen 100 UNIT/ML SOPN, Inject 3-4 Units into the skin 2 (two) times a day., Disp: 5 pen, Rfl: 3 .  LORazepam (ATIVAN) 0.5 MG tablet, TAKE ONE TABLET AT BEDTIME., Disp: 30 tablet, Rfl: 0 .  lovastatin (MEVACOR) 20 MG tablet, Take 1 tablet (20 mg total) by mouth at bedtime., Disp: 90 tablet, Rfl: 3 .  metoprolol tartrate (LOPRESSOR) 25 MG tablet, TAKE 1 TABLET TWICE DAILY, Disp: 180 tablet, Rfl: 2 .  montelukast (SINGULAIR) 10 MG tablet, Take 1 tablet (10 mg total) by mouth at bedtime., Disp: 90 tablet, Rfl: 0 .  Multiple Vitamins-Minerals (CENTRUM SILVER 50+WOMEN) TABS, Take 1 tablet by mouth daily., Disp: , Rfl:  .  Multiple Vitamins-Minerals (HAIR/SKIN/NAILS/BIOTIN PO), Take 1 tablet by mouth daily., Disp: , Rfl:  .  multivitamin-lutein (OCUVITE-LUTEIN) CAPS capsule, Take 1 capsule by mouth daily., Disp: , Rfl:  .  Olopatadine HCl (PATADAY) 0.2 % SOLN, Apply 1 drop to each eye once daily., Disp: 2.5 mL, Rfl: 0 .  omeprazole (PRILOSEC) 40 MG capsule, Take 1 capsule by mouth daily., Disp: , Rfl:  .  TRESIBA FLEXTOUCH 100 UNIT/ML SOPN FlexTouch Pen, INJECT 20 UNITS SUBCUTANEOUSLY ONE TIME DAILY, Disp: 30 mL, Rfl: 3 .  potassium chloride (K-DUR) 10 MEQ tablet, TAKE 1 TABLET EACH DAY. (Patient not taking: Reported on 05/23/2019), Disp: 30 tablet, Rfl: 0   Review of Systems:   ROS  Negative unless otherwise specified per HPI.   Vitals:   Vitals:   05/23/19 1300   BP: 128/68  Pulse: 65  Temp: 98.7 F (37.1 C)  TempSrc: Oral  SpO2: 95%  Weight: 121 lb 4 oz (55 kg)  Height: 4' 11"  (1.499 m)     Body mass index is 24.49 kg/m.  Physical Exam:   Physical Exam Constitutional:  Appearance: She is well-developed.  HENT:     Head: Normocephalic and atraumatic.  Eyes:     General: Lids are normal.        Right eye: No foreign body or discharge.        Left eye: No foreign body or discharge.     Extraocular Movements: Extraocular movements intact.     Conjunctiva/sclera: Conjunctivae normal.  Neck:     Musculoskeletal: Normal range of motion and neck supple.  Pulmonary:     Effort: Pulmonary effort is normal.  Musculoskeletal: Normal range of motion.     Comments: Lower extremities: Normal ROM of bilateral knees and ankles. Trace edema bilaterally. No obvious deformity or point tenderness. No calf swelling or tenderness.  Neck: No decreased ROM 2/2 pain with flexion/extension, lateral side bends, or rotation. No bony tenderness. No evidence of erythema, rash or ecchymosis. Negative STLR bilaterally.   Skin:    General: Skin is warm and dry.  Neurological:     Mental Status: She is alert and oriented to person, place, and time.  Psychiatric:        Behavior: Behavior normal.        Thought Content: Thought content normal.        Judgment: Judgment normal.      Assessment and Plan:   L[place] was seen today for neck pain and leg pain.  Diagnoses and all orders for this visit:  Arthritis, takes Tumeric  Trial Celebrex for this. If no improvement, will recommend sports medicine or ortho.  Uncontrolled type 2 diabetes mellitus with hyperglycemia, with long-term current use of insulin (H[facility]) -     Hemoglobin A1c  Chronic fatigue and malaise -     Magnesium -     Vitamin B12 -     TSH -     Comprehensive metabolic panel -     CBC with Differential/Platelet  B12 deficiency -     Vitamin B12  Serum potassium elevated -      Comp Met (CMET)  Decreased vision Discussed seeing Dr. Deloria Lair for her eyes, she was agreeable -- I gave her the phone number.   . Reviewed expectations re: course of current medical issues. . Discussed self-management of symptoms. . Outlined signs and symptoms indicating need for more acute intervention. . Patient verbalized understanding and all questions were answered. . See orders for this visit as documented in the electronic medical record. . Patient received an After-Visit Summary.  CMA or LPN served as scribe during this visit. History, Physical, and Plan performed by medical provider. The above documentation has been reviewed and is accurate and complete.   Inda Coke, PA-C

## 2019-05-23 NOTE — Patient Instructions (Addendum)
It was great to see you!  I would like for you to start the Celebrex 50 mg that Dr. Jerline Pain prescribed for you.  Please call Dr. Zadie Rhine for evaluation of your eye: [phone]  If the Celebrex doesn't help, we will need to send you to Sports Medicine or an Orthopedist.  Take care,  Inda Coke PA-C

## 2019-05-23 NOTE — Telephone Encounter (Signed)
Pt stated she was just in the office and stated she need the medication for Celebrex called in

## 2019-05-24 ENCOUNTER — Telehealth: Payer: Self-pay | Admitting: Family Medicine

## 2019-05-24 LAB — COMPREHENSIVE METABOLIC PANEL
ALT: 15 U/L (ref 0–35)
AST: 21 U/L (ref 0–37)
Albumin: 4.2 g/dL (ref 3.5–5.2)
Alkaline Phosphatase: 54 U/L (ref 39–117)
BUN: 24 mg/dL — ABNORMAL HIGH (ref 6–23)
CO2: 30 mEq/L (ref 19–32)
Calcium: 9.3 mg/dL (ref 8.4–10.5)
Chloride: 100 mEq/L (ref 96–112)
Creatinine, Ser: 1.26 mg/dL — ABNORMAL HIGH (ref 0.40–1.20)
GFR: 40.02 mL/min — ABNORMAL LOW (ref >60.00)
Glucose, Bld: 67 mg/dL — ABNORMAL LOW (ref 70–99)
Potassium: 4.6 mEq/L (ref 3.5–5.1)
Sodium: 138 mEq/L (ref 135–145)
Total Bilirubin: 0.4 mg/dL (ref 0.2–1.2)
Total Protein: 6.6 g/dL (ref 6.0–8.3)

## 2019-05-24 LAB — TSH: TSH: 0.96 u[IU]/mL (ref 0.35–4.50)

## 2019-05-24 LAB — MAGNESIUM: Magnesium: 1.9 mg/dL (ref 1.5–2.5)

## 2019-05-24 LAB — VITAMIN B12: Vitamin B-12: 616 pg/mL (ref 211–911)

## 2019-05-24 NOTE — Telephone Encounter (Signed)
Patient called in and seems very confused about what BP medicine she should be taking. Patient would like a call back to know what she should be actually taking for her BP.

## 2019-05-24 NOTE — Telephone Encounter (Signed)
I spoke to pt and she wanted to know which Bp medications she was supposed to be taking.  Patient found a bottle of Lisinopril,  I informed her that she was told not to take that in 08/2018.  Patient was in office yesterday and I explained to her that we go over all medications during visit, and that Lisinopril was not current on her list.  Patient verbalized understanding not to take that medication.

## 2019-05-25 ENCOUNTER — Other Ambulatory Visit: Payer: Self-pay

## 2019-05-25 ENCOUNTER — Telehealth: Payer: Self-pay

## 2019-05-25 NOTE — Progress Notes (Signed)
After speaking with Dr. Juleen China about patient's phone call yesterday about her medications,  She informed me to put in a referral for [facility].

## 2019-05-25 NOTE — Patient Outreach (Signed)
[facility]) Care Management  05/25/2019  Vanessa Gibson 04/16/1928 [id number]   Referral Date:05/25/2019 Referral Source: MD referral Referral Reason: Help with medication   Outreach Attempt: spoke with patient.  She states she does not have time to talk.  Advised patient on reason for call.  Patient states that she told her doctor she does not want any help and that she was not trying to be rude.  She states that she gives herself her medications and has no problems.  Tried to convince patient to at least try to work with the pharmacist to help with her medications.  She still refused.  Patient refused CM to call other family as she states they are not involved in her care or medications.     Plan: RN CM notified PCP of patient refusal.  RN CM will close case.     Jone Baseman, RN, MSN [facility] Care Management Care Management Coordinator Direct Line [phone] Toll Free: [phone]  Fax: [phone]

## 2019-05-25 NOTE — Telephone Encounter (Signed)
I called son and informed him that Dr. Juleen China did not prescribe that medication to Mrs. Rossin.  He verbalized understanding.

## 2019-05-25 NOTE — Telephone Encounter (Signed)
Copied from [place] [phone]. Topic: Quick Communication - Rx Refill/Question >> May 25, 2019  8:56 AM Erick Blinks wrote: Medication: Insulin Aspart FlexPen 100 UNIT/ML SOPN Son requesting call back to discuss cheaper alternative Best Contact: [phone]

## 2019-05-31 NOTE — Telephone Encounter (Signed)
Pt calling back to get results.  Copied from [place] [phone]. Topic: Quick Communication - Lab Results (Clinic Use ONLY) >> May 31, 2019  3:02 PM Marian Sorrow, LPN wrote: Called patient to inform them of 05/23/2019 lab results. When patient returns call, triage nurse may disclose results.

## 2019-06-01 ENCOUNTER — Telehealth: Payer: Self-pay | Admitting: Endocrinology

## 2019-06-01 ENCOUNTER — Ambulatory Visit: Payer: Self-pay | Admitting: *Deleted

## 2019-06-01 NOTE — Telephone Encounter (Signed)
See result note. Pt given lab results on 06/01/19.

## 2019-06-02 NOTE — Telephone Encounter (Signed)
Question about lab result

## 2019-06-10 ENCOUNTER — Other Ambulatory Visit: Payer: Self-pay | Admitting: Endocrinology

## 2019-06-15 ENCOUNTER — Other Ambulatory Visit: Payer: Self-pay | Admitting: Endocrinology

## 2019-06-27 ENCOUNTER — Other Ambulatory Visit: Payer: Self-pay | Admitting: Family Medicine

## 2019-06-27 DIAGNOSIS — G4700 Insomnia, unspecified: Secondary | ICD-10-CM

## 2019-06-27 DIAGNOSIS — J301 Allergic rhinitis due to pollen: Secondary | ICD-10-CM

## 2019-06-27 DIAGNOSIS — R600 Localized edema: Secondary | ICD-10-CM

## 2019-06-27 DIAGNOSIS — F339 Major depressive disorder, recurrent, unspecified: Secondary | ICD-10-CM

## 2019-06-28 ENCOUNTER — Ambulatory Visit: Payer: Self-pay

## 2019-06-28 ENCOUNTER — Other Ambulatory Visit: Payer: Self-pay

## 2019-06-28 NOTE — Telephone Encounter (Signed)
Ok to fill meds?

## 2019-06-28 NOTE — Telephone Encounter (Signed)
Patient called, left VM to return call to the office.     Message from Nils Flack sent at 06/28/2019 4:18 PM EDT  Summary: medication issue?   Pt was given celecoxib (CELEBREX) 50 MG capsule. She thinks it is for pain and inflammation. The pain has went away except for her neck and head. Pt is complaining of general weakness and fatigue. She thinks it is because of the medication and is wondering if the medication is too strong. She is taking 2 tabs a day.

## 2019-06-28 NOTE — Telephone Encounter (Signed)
See note

## 2019-06-28 NOTE — Telephone Encounter (Signed)
Patient called and says she's been taking Celebrex twice a day and asks if it can be too strong for her. She says it's relieving the pain, but since starting the medication, she's been feeling exhausted, tired and sleepy all the time. I advised I will send this to Dr. Jerline Pain who prescribed it and someone will call back with his recommendation. She asks for a VM to be left on her phone.    Answer Assessment - Initial Assessment Questions 1.   NAME of MEDICATION: "What medicine are you calling about?"     Celebrex 2.   QUESTION: "What is your question?"     Is it too strong?  3.   PRESCRIBING HCP: "Who prescribed it?" Reason: if prescribed by specialist, call should be referred to that group.     Dr. Jerline Pain 4. SYMPTOMS: "Do you have any symptoms?"     Feeling tired, exhausted and sleepy all the time. 5. SEVERITY: If symptoms are present, ask "Are they mild, moderate or severe?"     Moderate 6.  PREGNANCY:  "Is there any chance that you are pregnant?" "When was your last menstrual period?"     N/A  Protocols used: MEDICATION QUESTION CALL-A-AH

## 2019-06-29 NOTE — Telephone Encounter (Signed)
Please advise ok to decrease Celebrex to one tab daily?

## 2019-06-29 NOTE — Telephone Encounter (Signed)
[place] patient.

## 2019-06-29 NOTE — Telephone Encounter (Signed)
Amber, please call patient. Okay to decrease to 1/2 tab daily.

## 2019-06-29 NOTE — Telephone Encounter (Signed)
LM on patient's voicemail with information, as requested.

## 2019-06-29 NOTE — Telephone Encounter (Signed)
Sure, one is okay. Please make sure that she has a follow up appointment with me. Thanks!

## 2019-06-29 NOTE — Telephone Encounter (Signed)
It appears that patient was prescribed capsules.  Ok to decrease to 1 daily?

## 2019-06-29 NOTE — Telephone Encounter (Signed)
Copied from [place] [phone]. Topic: General - Other >> Jun 29, 2019  9:05 AM Vanessa Gibson wrote: Reason for CRM: Pt returning call to nurse regarding medication. See phone note from 06/28/2019. Pt states she will be out of the house and is requesting Gibson message be left. Please advise.

## 2019-07-01 ENCOUNTER — Other Ambulatory Visit: Payer: Self-pay | Admitting: *Deleted

## 2019-07-01 NOTE — Patient Outreach (Signed)
[facility]) Care Management  07/01/2019  Eiko Mcgowen 01/26/1928 [id number]   Subjective: Telephone call to patient's home  / mobile number, no answer, left HIPAA compliant voicemail message, and requested call back.    Objective: Per KPN (Knowledge Performance Now, point of care tool) and chart review, patient has had no recent hospitalizations or ED visits.  Patient also has a history of asthma, breast cancer (bilateral), skin cancer of the face, IBS, arthritis, hypertension,  hyperlipidemia, Chronic pancreatitis, Mandibular fracture, and MI (myocardial infarction).      Assessment: Received [employer] Medicare EMMI Prevent referral on 06/28/2019.  Referral source: Darvin Neighbours at [facility].   Referral reason: Patient Engagement Tool score.   Screening  follow up pending patient contact.     Plan: RNCM will send unsuccessful outreach  letter, [facility] pamphlet, will call patient for 2nd telephone outreach attempt within 4 business days, screening follow up, and will proceed with case closure within 10 business days if no return call.     Amarii Amy H. Annia Friendly, BSN, [place] [facility] Telephonic CM Phone: [phone] Fax: [phone]

## 2019-07-03 NOTE — Progress Notes (Signed)
Virtual Visit via Video   Due to the COVID-19 pandemic, this visit was completed with telemedicine (audio/video) technology to reduce patient and provider exposure as well as to preserve personal protective equipment.   I connected with Vanessa Gibson by a video enabled telemedicine application and verified that I am speaking with the correct person using two identifiers. Location patient: Home Location provider: [facility], Office Persons participating in the virtual visit: Louan Zaman, Briscoe Deutscher, DO   I discussed the limitations of evaluation and management by telemedicine and the availability of in person appointments. The patient expressed understanding and agreed to proceed.  Care Team   Patient Care Team: Briscoe Deutscher, DO as PCP - General (Family Medicine) Jerline Pain, MD as PCP - Cardiology (Cardiology) Zadie Rhine Clent Demark, MD as Consulting Physician (Ophthalmology) [name], [facility] (Pharmacist)  Subjective:   HPI: Patient has had pain and swelling in Lt>Rt ears for over two years. She states that she has been having decreased hearing as well.   Patient with history of neck pain, headaches, chronic complaints of ear pain described as burning and achy, presenting with the same today.  She feels that her symptoms started when a ceiling fan fell from her ceiling.  She believes that mold and environmental exposures are causing her continued symptoms.  She did see Dr. Felecia Shelling last year for the same.  He did want to order nerve conduction studies but that did not happen.  No MRI.  Patient is taking Singulair, Flonase, Astelin.  She just ran out of the Astelin so needs a refill.  Not on any antihistamine at this point.  No hearing changes or tinnitus.   Review of Systems  Constitutional: Negative for chills and fever.  HENT: Positive for ear pain and hearing loss. Negative for tinnitus.   Eyes: Negative for blurred vision and double vision.  Respiratory: Negative for cough and  wheezing.   Cardiovascular: Negative for chest pain, palpitations and leg swelling.  Gastrointestinal: Negative for nausea and vomiting.  Genitourinary: Negative for dysuria and urgency.  Musculoskeletal: Negative for myalgias.  Neurological: Negative for headaches.  Psychiatric/Behavioral: Negative for depression and suicidal ideas.    Patient Active Problem List   Diagnosis Date Noted  . Bilateral hand numbness 10/01/2018  . Neck pain 10/01/2018  . Bilateral lower extremity edema 06/18/2018  . Primary osteoarthritis of both knees 06/18/2018  . Arthralgia of both hands 06/18/2018  . Chronic cough 06/18/2018  . Hernia of abdominal wall 06/18/2018  . ILD (interstitial lung disease) ([place]) 04/05/2018  . Depression, recurrent ([place]), unhappy in [place] but feels stuck, declines medication or counseling 01/24/2018  . At risk for polypharmacy, due to vitamin overuse, working with patient to "deprescribe" 01/24/2018  . Constipation, uses Miralax prn 01/24/2018  . Diabetic peripheral neuropathy associated with type 2 diabetes mellitus ([place]), unable to tolerate Neurontin 01/24/2018  . Aortic atherosclerosis ([place]), 2018 CXR 01/24/2018  . Lumbar spine scoliosis 01/24/2018  . Hyperlipidemia associated with type 2 diabetes mellitus ([place]), on statin 01/24/2018  . IBS (irritable bowel syndrome) 01/24/2018  . Uncontrolled type 2 diabetes mellitus with hyperglycemia, with long-term current use of insulin ([place]) 12/02/2017  . Insomnia 06/06/2017  . CKD stage 3 secondary to diabetes ([place]), on Lisinopril 05/26/2017  . Osteoporosis, post-menopausal 02/06/2016  . Bilateral hearing loss, refuses hearing aids or further testing 02/06/2016  . Arthritis, takes Tumeric  02/06/2016  . Anemia of chronic disease 12/05/2015  . Protein-calorie malnutrition, weight stable 11/13/2015  .  Chronic pancreatitis ([place]) 11/13/2015  . GERD (gastroesophageal reflux disease), on Nexium 11/13/2015  . Hypertension associated with  diabetes ([place]), on Lisinopril 11/13/2015  . Allergic rhinitis, Rx Xyzal, flonase, atrovent and has been offered referral to Allergy 11/13/2015    Social History   Tobacco Use  . Smoking status: Never Smoker  . Smokeless tobacco: Never Used  Substance Use Topics  . Alcohol use: No   Current Outpatient Medications:  .  acetaminophen (TYLENOL) 325 MG tablet, Take 2 tablets (650 mg total) by mouth every 6 (six) hours as needed for moderate pain or headache., Disp: , Rfl:  .  Ascorbic Acid (VITAMIN C) 1000 MG tablet, Take 1,000 mg by mouth daily., Disp: , Rfl:  .  aspirin EC 81 MG tablet, Take 81 mg by mouth daily., Disp: , Rfl:  .  azelastine (ASTELIN) 0.1 % nasal spray, Place 1 spray into both nostrils 2 (two) times daily. Use in each nostril as directed, Disp: 30 mL, Rfl: 12 .  celecoxib (CELEBREX) 50 MG capsule, Take 1 capsule (50 mg total) by mouth 2 (two) times daily., Disp: 60 capsule, Rfl: 0 .  citalopram (CELEXA) 10 MG tablet, TAKE 1 TABLET ONCE DAILY., Disp: 30 tablet, Rfl: 0 .  fluticasone (FLONASE) 50 MCG/ACT nasal spray, Place 2 sprays into both nostrils daily., Disp: 16 g, Rfl: 0 .  furosemide (LASIX) 20 MG tablet, TAKE 1 TABLET ONCE DAILY., Disp: 90 tablet, Rfl: 0 .  Insulin Aspart FlexPen 100 UNIT/ML SOPN, Inject 3-4 Units into the skin 2 (two) times a day., Disp: 5 pen, Rfl: 3 .  LORazepam (ATIVAN) 0.5 MG tablet, TAKE ONE TABLET AT BEDTIME., Disp: 30 tablet, Rfl: 0 .  lovastatin (MEVACOR) 20 MG tablet, Take 1 tablet (20 mg total) by mouth at bedtime., Disp: 90 tablet, Rfl: 3 .  metoprolol tartrate (LOPRESSOR) 25 MG tablet, TAKE 1 TABLET TWICE DAILY, Disp: 180 tablet, Rfl: 2 .  montelukast (SINGULAIR) 10 MG tablet, TAKE ONE TABLET AT BEDTIME., Disp: 90 tablet, Rfl: 0 .  Multiple Vitamins-Minerals (CENTRUM SILVER 50+WOMEN) TABS, Take 1 tablet by mouth daily., Disp: , Rfl:  .  Olopatadine HCl (PATADAY) 0.2 % SOLN, Apply 1 drop to each eye once daily., Disp: 2.5 mL, Rfl: 0 .   omeprazole (PRILOSEC) 40 MG capsule, TAKE 1 CAPSULE EVERY DAY., Disp: 90 capsule, Rfl: 0 .  potassium chloride (K-DUR) 10 MEQ tablet, TAKE 1 TABLET EACH DAY., Disp: 30 tablet, Rfl: 0 .  TRESIBA FLEXTOUCH 100 UNIT/ML SOPN FlexTouch Pen, INJECT 20 UNITS SUBCUTANEOUSLY ONE TIME DAILY, Disp: 30 mL, Rfl: 3  No Known Allergies  Objective:   VITALS: Per patient if applicable, see vitals. GENERAL: Alert, appears well and in no acute distress. HEENT: Atraumatic, conjunctiva clear, no obvious abnormalities on inspection of external nose and ears. NECK: Normal movements of the head and neck. CARDIOPULMONARY: No increased WOB. Speaking in clear sentences. I:E ratio WNL.  MS: Moves all visible extremities without noticeable abnormality. PSYCH: Pleasant and cooperative, well-groomed. Speech normal rate and rhythm. Affect is appropriate. Insight and judgement are appropriate. Attention is focused, linear, and appropriate.  NEURO: CN grossly intact. Oriented as arrived to appointment on time with no prompting. Moves both UE equally.  SKIN: No obvious lesions, wounds, erythema, or cyanosis noted on face or hands.  Depression screen [facility] 2/9 07/04/2019 06/18/2018 08/12/2017  Decreased Interest 3 3 2   Down, Depressed, Hopeless 3 2 2   PHQ - 2 Score 6 5 4   Altered sleeping 0 2  3  Tired, decreased energy 3 0 3  Change in appetite 3 0 1  Feeling bad or failure about yourself  0 3 2  Trouble concentrating 3 2 3   Moving slowly or fidgety/restless 3 2 -  Suicidal thoughts 0 0 0  PHQ-9 Score 18 14 16   Difficult doing work/chores Extremely dIfficult - Very difficult   Assessment and Plan:   [place] was seen today for fatigue.  Diagnoses and all orders for this visit:  Polyneuropathy  Non-seasonal allergic rhinitis due to other allergic trigger -     levocetirizine (XYZAL) 5 MG tablet; Take 0.5 tablets (2.5 mg total) by mouth every evening. -     azelastine (ASTELIN) 0.1 % nasal spray; Place 1 spray into both  nostrils 2 (two) times daily. Use in each nostril as directed  Otalgia of both ears -     ofloxacin (FLOXIN OTIC) 0.3 % OTIC solution; Place 5 drops into both ears daily.    Marland Kitchen COVID-19 Education: The signs and symptoms of COVID-19 were discussed with the patient and how to seek care for testing if needed. The importance of social distancing was discussed today. . Reviewed expectations re: course of current medical issues. . Discussed self-management of symptoms. . Outlined signs and symptoms indicating need for more acute intervention. . Patient verbalized understanding and all questions were answered. Marland Kitchen Health Maintenance issues including appropriate healthy diet, exercise, and smoking avoidance were discussed with patient. . See orders for this visit as documented in the electronic medical record.  Briscoe Deutscher, DO  Records requested if needed. Time spent: 25 minutes, of which >50% was spent in obtaining information about her symptoms, reviewing her previous labs, evaluations, and treatments, counseling her about her condition (please see the discussed topics above), and developing a plan to further investigate it; she had a number of questions which I addressed.

## 2019-07-04 ENCOUNTER — Encounter: Payer: Self-pay | Admitting: Family Medicine

## 2019-07-04 ENCOUNTER — Ambulatory Visit (INDEPENDENT_AMBULATORY_CARE_PROVIDER_SITE_OTHER): Payer: Medicare HMO | Admitting: Family Medicine

## 2019-07-04 ENCOUNTER — Other Ambulatory Visit: Payer: Self-pay

## 2019-07-04 ENCOUNTER — Other Ambulatory Visit: Payer: Self-pay | Admitting: *Deleted

## 2019-07-04 VITALS — BP 129/58 | HR 67 | Ht 59.0 in | Wt 121.0 lb

## 2019-07-04 DIAGNOSIS — H9203 Otalgia, bilateral: Secondary | ICD-10-CM | POA: Diagnosis not present

## 2019-07-04 DIAGNOSIS — J3089 Other allergic rhinitis: Secondary | ICD-10-CM | POA: Diagnosis not present

## 2019-07-04 DIAGNOSIS — G629 Polyneuropathy, unspecified: Secondary | ICD-10-CM | POA: Diagnosis not present

## 2019-07-04 MED ORDER — AZELASTINE HCL 0.1 % NA SOLN: 1.0000 | Freq: Two times a day (BID) | NASAL | 12 refills | Status: DC

## 2019-07-04 MED ORDER — LEVOCETIRIZINE DIHYDROCHLORIDE 5 MG PO TABS: 2.5000 mg | ORAL_TABLET | Freq: Every evening | ORAL | 1 refills | Status: DC

## 2019-07-04 MED ORDER — OFLOXACIN 0.3 % OT SOLN: 5.0000 [drp] | Freq: Every day | OTIC | 0 refills | Status: DC

## 2019-07-04 NOTE — Patient Outreach (Addendum)
[place] [facility]) Care Management  07/04/2019  Vanessa Gibson [birth year] [id number]   Subjective: Telephone call to patient's home / mobile number, spoke with patient, and HIPAA verified.  Discussed [place] Medicare EMMI Prevent referral follow up, patient voiced understanding, and is in agreement to follow up.    Patient states she is not doing good, has not been feeling well for approximately 2 years, having problems with ears, does not hear well, ear warmness at times, more pain in left ear, pain is from top of head to bottom of neck on left side, decreased overall strength, sleepy at times, fatigued at times, has discussed these signs/ symptoms with primary provider in the past, has received no additional medications, is planning to discussed her current signs / symptoms/ concerns, with primary provider today via telephone/ video visit.  States shoulder pain has improved with medication.  States her biggest concern is getting assistance with ear and above signs/ symptoms at this time, she will discuss with provider during today's visit.  States she scared to see providers in person due to corona virus and has not followed up on obtain hearing aids due to restrictions. States she remember speaking with referral source.  Patient states she is able to manage self care and has assistance as needed. States she currently living in an independent living apartment at [facility], has blood pressure checks twice a week by facility staff, facility hot meals everyday, monthly cleaning services,  she participates in exercise programs as tolerated, and group activites.   States she is aware of how to access additional services at facility as needed, does not want to increase assistance at this time, and feels that addressing her above signs/ symptoms/ concerns will decrease of her for additional personal care services.   Patient states she also has a long term care policy and is aware  of how to access benefits if needed in the future.  Patient voices understanding of medical diagnosis and treatment plan.  Patient states her insulin cost has recently increased, gave verbal consult to discuss insulin assistance with daughter [name]) and son -in-law [name]) as needed, patient in agreement to referral to [place] for insulin medication assistance.  Discussed Depression Screening score with patient, states she has a history of depression, currently taking depression medication, she will discuss score with provider during today's visit, she feels score is related to her current medical issues, and declined this RNCM's  assistance with provider discussion.   States she is accessing her [employer] benefits as needed via member services number on back of card, will follow up regarding her over the counter benefit, and request replacement catalog (has missed placed her catalog).  Discussed [facility] Care Management services, patient voices understanding, declines referral to [facility] [facility] Telephonic RNCM for care coordination, declines referral for Health Coach disease education, and declines referral to Education officer, museum for personal care community resources, and / or depression screening follow up / counseling at this time, she will access these services if needed in the future.   Patient states she does not have any education material, transition of care, care coordination, disease management, disease monitoring, transportation, or community resource needs at this time.   States she is very appreciative of the follow up, this RNCM's patient and kindness, is in agreement to receive [facility] Care Management information, receive [facility] [facility] services.     Objective: Per KPN (Knowledge Performance Now, point of care tool) and  chart review, patient has had no recent hospitalizations or ED visits.  Patient also has a history of asthma, breast cancer (bilateral), skin  cancer of the face, IBS, arthritis, hypertension,  hyperlipidemia, Chronic pancreatitis, Mandibular fracture, and MI (myocardial infarction).      Assessment: Received [employer] Medicare EMMI Prevent referral on 06/28/2019.  Referral source: Darvin Neighbours at [facility].   Referral reason: Patient Engagement Tool score.   Screening follow up completed and will refer patient to [place] for insulin assistance.      Plan: RNCM will refer patient to [facility] for insulin assistance.  RNCM will send patient successful outreach letter, [facility] pamphlet, and magnet, per patient's request.    Maliya Marich H. Annia Friendly, BSN, [place] [facility] Telephonic CM Phone: [phone] Fax: [phone]

## 2019-07-05 ENCOUNTER — Encounter: Payer: Self-pay | Admitting: Family Medicine

## 2019-07-06 ENCOUNTER — Other Ambulatory Visit: Payer: Self-pay | Admitting: Pharmacy Technician

## 2019-07-06 ENCOUNTER — Other Ambulatory Visit: Payer: Self-pay | Admitting: Pharmacist

## 2019-07-06 NOTE — Patient Outreach (Addendum)
[facility]) Care Management  07/06/2019  Nayzeth Altman 31-Jul-1928 [id number]                          Medication Assistance Referral  Referral From: [place]  Medication/Company: Tobie Lords and Tyler [employer]  / [employer] Patient application portion: Emailed to [email or web address] (Sent **Secured**) Provider application portion: Faxed  to Dr. [employer]   Follow up:  Will follow up with patient in 7-10 business days to confirm application(s) have been received.  04-22-1997 [name] [place] Certified Pharmacy Technician [place] [facility] Direct Dial:[phone]

## 2019-07-06 NOTE — Patient Outreach (Addendum)
[place] [facility]) Care Management  [place]   07/06/2019  Vanessa Gibson 03/06/1928 [id number]  Reason for referral: Medication Assistance  -Per notes, patient has had [place] services in the past with assistance with Novolog and Tresiba pens and pen needles in 2018 -Patient declined [place] assistance July 2020, now agreeable to services  Referral source: EMMI prevent referral Current insurance: [employer]  PMHx includes but not limited to:  Depression, T2DM with peripheral neuropathy, HTN, HLD, IBS, GERD, CKD-III, arthritis, anemia, osteoporosis  Outreach:  Successful telephone call with Vanessa Gibson.  HIPAA identifiers verified.   Subjective:  Patient reports that "it's getting hard" to remember to take medications each day and she takes "too many pills."  She then states she is "not too worried about it." She is checking her CBGs and states this morning, fasting CBG was low at 76 but high last night at 156.  She is concerned about needing her insulin dose adjusted.  She states her endocrinologist told her to reduce usual short-acting insulin by 2 units if CBG is low.  She is planning on calling endocrinologist later today to ask about insulin dose.  She reports she has been filling her insulins through [facility] mail order and cost of 3 month supply will be > $400.    Objective: The ASCVD Risk score Mikey Bussing DC Jr., et al., 2013) failed to calculate for the following reasons:   The 2013 ASCVD risk score is only valid for ages 24 to 40  Lab Results  Component Value Date   CREATININE 1.26 (H) 05/23/2019   CREATININE 1.25 (H) 04/26/2019   CREATININE 1.45 (H) 12/27/2018    Lab Results  Component Value Date   HGBA1C 6.5 05/23/2019    Lipid Panel     Component Value Date/Time   CHOL 146 03/17/2018 1104   TRIG 114.0 03/17/2018 1104   HDL 52.30 03/17/2018 1104   CHOLHDL 3 03/17/2018 1104   VLDL 22.8 03/17/2018 1104   LDLCALC 71 03/17/2018 1104    BP Readings  from Last 3 Encounters:  07/04/19 (!) 129/58  05/23/19 128/68  05/11/19 114/61    No Known Allergies  Medications Reviewed Today    Reviewed by Briscoe Deutscher, DO (Physician) on 07/05/19 at 1245  Med List Status: <None>  Medication Order Taking? Sig Documenting Provider Last Dose Status Informant  acetaminophen (TYLENOL) 325 MG tablet [id number] Yes Take 2 tablets (650 mg total) by mouth every 6 (six) hours as needed for moderate pain or headache. Iline Oven, MD Taking Active Self  Alcohol Swabs (B-D SINGLE USE SWABS REGULAR) PADS [id number] Yes USE TO CLEAN FINGER BEFORE CHECKING BLOOD SUGARS AND CLEAN SKIN BEFORE INSULIN INJECTIONS. Elayne Snare, MD Taking Active   Ascorbic Acid (VITAMIN C) 1000 MG tablet [id number] Yes Take 1,000 mg by mouth daily. [provider] Taking Active Self  aspirin EC 81 MG tablet [id number] Yes Take 81 mg by mouth daily. [provider] Taking Active Self  azelastine (ASTELIN) 0.1 % nasal spray [id number] Yes Place 1 spray into both nostrils 2 (two) times daily. Use in each nostril as directed Briscoe Deutscher, DO  Active   BD PEN NEEDLE NANO U/F 32G X 4 MM MISC [id number] Yes USE TO INJECT INSULINS 4 TIMES DAILY. Elayne Snare, MD Taking Active   celecoxib (CELEBREX) 50 MG capsule [id number] Yes Take 1 capsule (50 mg total) by mouth 2 (two) times daily. Inda Coke, [place] Taking Active   citalopram (CELEXA) 10 MG  tablet [id number] Yes TAKE 1 TABLET ONCE DAILY. Briscoe Deutscher, DO Taking Active   fluticasone (FLONASE) 50 MCG/ACT nasal spray [id number] Yes Place 2 sprays into both nostrils daily. Briscoe Deutscher, DO Taking Active   furosemide (LASIX) 20 MG tablet [id number] Yes TAKE 1 TABLET ONCE DAILY. Briscoe Deutscher, DO Taking Active   Insulin Aspart FlexPen 100 UNIT/ML SOPN [id number] Yes Inject 3-4 Units into the skin 2 (two) times a day. Elayne Snare, MD Taking Active   levocetirizine (XYZAL) 5 MG tablet [id number]  Take 0.5 tablets (2.5 mg total) by  mouth every evening. Briscoe Deutscher, DO  Active   LORazepam (ATIVAN) 0.5 MG tablet [id number] Yes TAKE ONE TABLET AT BEDTIME. Briscoe Deutscher, DO Taking Active   lovastatin (MEVACOR) 20 MG tablet [id number] Yes Take 1 tablet (20 mg total) by mouth at bedtime. Briscoe Deutscher, DO Taking Active   metoprolol tartrate (LOPRESSOR) 25 MG tablet [id number] Yes TAKE 1 TABLET TWICE DAILY Elayne Snare, MD Taking Active   montelukast (SINGULAIR) 10 MG tablet [id number] Yes TAKE ONE TABLET AT BEDTIME. Briscoe Deutscher, DO Taking Active   Multiple Vitamins-Minerals (CENTRUM SILVER 50+WOMEN) TABS [id number] Yes Take 1 tablet by mouth daily. [provider] Taking Active Self  Multiple Vitamins-Minerals (HAIR/SKIN/NAILS/BIOTIN PO) [id number] Yes Take 1 tablet by mouth daily. [provider] Taking Active Self  multivitamin-lutein (OCUVITE-LUTEIN) CAPS capsule [id number] Yes Take 1 capsule by mouth daily. [provider] Taking Active Self  ofloxacin (FLOXIN OTIC) 0.3 % OTIC solution [id number]  Place 5 drops into both ears daily. Briscoe Deutscher, DO  Active   Olopatadine HCl (PATADAY) 0.2 % SOLN [id number] Yes Apply 1 drop to each eye once daily. Vivi Barrack, MD Taking Active   omeprazole (PRILOSEC) 40 MG capsule [id number] Yes TAKE 1 CAPSULE EVERY DAY. Briscoe Deutscher, DO Taking Active   potassium chloride (K-DUR) 10 MEQ tablet [id number] Yes TAKE 1 TABLET EACH DAY. Briscoe Deutscher, DO Taking Active   TRESIBA FLEXTOUCH 100 UNIT/ML SOPN FlexTouch Pen [id number] Yes INJECT 20 UNITS SUBCUTANEOUSLY ONE TIME DAILY Elayne Snare, MD Taking Active   TRUEplus Lancets 28G MISC [id number] Yes CHECK BLOOD SUGAR 3 TO 4 TIMES DAILY. Elayne Snare, MD Taking Active           Assessment: Patient not able to review medications today.    Medication Adherence Findings: Patient mentioned that she sometimes has trouble remembering to take her medications.  She organizes her medications each week into a pillbox.  We  reviewed strategies to improve adherence including using alarms, calendars, compliance packs, ect.  Patient voiced understanding. She denies wanting to change her current method right now but is appreciative of the information.    Medication Assistance Findings:  Medication assistance needs identified: insulins  Extra Help:  Not eligible for Extra Help Low Income Subsidy based on reported income and assets  Patient Assistance Programs: Novolog and [name] made by [place] requirement met: Yes o Out-of-pocket prescription expenditure met:   Not Applicable - Patient has met application requirements to apply for this program.  - Reviewed program requirements with patient.   - Patient also requested pen needles to be included if possible  Patient requests that I reach out to her son, [name], regarding proof of income for [employer] patient assistance program.  -Successful call placed to son.  Son reports he is POA and can help complete application for patient.  He requests that we email him at [email or web address].  We reviewed that he will need to  include proof of income as well as POA documentation when he emails application back.  Son asks that I review EMR for copy of POA documents.    Additional medication assistance options reviewed with patient as warranted:  Insurance OTC catalogue  -Patient has not been using [facility] but would like to have book sent to her house.  I also provided patient with the phone number to the [place] so she can order via phone immediately.  Patient voiced understanding.   Plan: . Will have [place] technician start patient assistance program application for patient.   . Will communicate with [facility] pharmacy technician re:  POA documentation in EMR . Will mail [employer] OTC book to patient  Ralene Bathe, PharmD, [place] [phone]

## 2019-07-08 ENCOUNTER — Other Ambulatory Visit: Payer: Self-pay | Admitting: Pharmacist

## 2019-07-08 ENCOUNTER — Ambulatory Visit: Payer: Self-pay | Admitting: Pharmacist

## 2019-07-08 NOTE — Patient Outreach (Signed)
[place] [facility]) Care Management  [place] 07/08/2019  Vanessa Gibson 05-Feb-1928 [id number]  Incoming call from patient's son, [name].  [name] reports he is unable to open secure email from [facility] with patient assistance applications.  He requests email be sent to his non-work email at [email or web address].  He also requests that a hardcopy be mailed to his mom.  He reports he has his POA paperwork and can attach this when he returns email.    Plan: Will update [place], PharmD, [place] [phone]

## 2019-07-18 DIAGNOSIS — H353211 Exudative age-related macular degeneration, right eye, with active choroidal neovascularization: Secondary | ICD-10-CM | POA: Diagnosis not present

## 2019-07-18 DIAGNOSIS — H353132 Nonexudative age-related macular degeneration, bilateral, intermediate dry stage: Secondary | ICD-10-CM | POA: Diagnosis not present

## 2019-07-18 DIAGNOSIS — H35371 Puckering of macula, right eye: Secondary | ICD-10-CM | POA: Diagnosis not present

## 2019-07-25 ENCOUNTER — Other Ambulatory Visit: Payer: Self-pay | Admitting: Family Medicine

## 2019-07-25 ENCOUNTER — Other Ambulatory Visit: Payer: Self-pay | Admitting: Physician Assistant

## 2019-07-25 DIAGNOSIS — R600 Localized edema: Secondary | ICD-10-CM

## 2019-07-25 DIAGNOSIS — H9203 Otalgia, bilateral: Secondary | ICD-10-CM

## 2019-07-25 DIAGNOSIS — F339 Major depressive disorder, recurrent, unspecified: Secondary | ICD-10-CM

## 2019-07-25 DIAGNOSIS — G4700 Insomnia, unspecified: Secondary | ICD-10-CM

## 2019-07-27 ENCOUNTER — Other Ambulatory Visit: Payer: Self-pay | Admitting: Endocrinology

## 2019-07-27 NOTE — Telephone Encounter (Signed)
Last fill Lorazepam  06/29/19  #30/0 Metoprolol 09/08/18  #180/2 Furosemide  04/08/19  #90/0 Potassium  06/29/19  #30/0 Ofloxacin 07/04/19  #71ml/0 Citalopram HBR 06/29/19  #30/0  Last oV 07/04/19

## 2019-07-28 NOTE — Telephone Encounter (Signed)
Last OV 07/04/19 Last refill 05/23/19 #60/0 by Inda Coke, PA Next OV not scheduled  OK to refill?

## 2019-08-03 ENCOUNTER — Other Ambulatory Visit: Payer: Self-pay | Admitting: Pharmacy Technician

## 2019-08-03 DIAGNOSIS — Z03818 Encounter for observation for suspected exposure to other biological agents ruled out: Secondary | ICD-10-CM | POA: Diagnosis not present

## 2019-08-05 ENCOUNTER — Telehealth: Payer: Self-pay | Admitting: Endocrinology

## 2019-08-05 NOTE — Telephone Encounter (Signed)
4 units was appropriate.  She will need to let us know if she has had blood sugars below 90 consistently in the morning

## 2019-08-05 NOTE — Telephone Encounter (Signed)
Called pt and left detailed voicemail with MD message. Requested a callback for her to inform us if blood sugars have been consistently below 90 in the am.

## 2019-08-05 NOTE — Telephone Encounter (Signed)
Patient advised her blood sugar has been above 90 every morning but today. Informed patient to continue her medication as normal and to also reference to the VM if she has any confusion.

## 2019-08-05 NOTE — Telephone Encounter (Signed)
Patient ph# [phone]  called  Re: patient is sick. Patient's blood sugar was 74 this morning. Instead of taking 6 units of medication, patient took 4 units and wants to know if she took the right amount of units. Please call patient at the ph# listed above to advise.

## 2019-08-08 ENCOUNTER — Other Ambulatory Visit: Payer: Self-pay | Admitting: Pharmacy Technician

## 2019-08-08 ENCOUNTER — Other Ambulatory Visit: Payer: Self-pay | Admitting: Endocrinology

## 2019-08-08 NOTE — Patient Outreach (Signed)
[place] [facility]) Care Management  08/08/2019  Vanessa Gibson 1928-11-07 [id number]    Successful call placed to patients son, [name] regarding patient assistance application(s) for [employer] , HIPAA identifiers verified. [name] was able to locate application that had been previously emailed to him. He states that he would complete applications and email or fax them back into me.  Follow up:  Will submit applications to company once documents have been received.  [name] Certified Pharmacy Technician [place] Management Direct Dial:[phone]

## 2019-08-09 ENCOUNTER — Ambulatory Visit: Payer: Medicare HMO | Admitting: Endocrinology

## 2019-08-18 ENCOUNTER — Other Ambulatory Visit: Payer: Self-pay

## 2019-08-18 DIAGNOSIS — G4700 Insomnia, unspecified: Secondary | ICD-10-CM

## 2019-08-18 DIAGNOSIS — J301 Allergic rhinitis due to pollen: Secondary | ICD-10-CM

## 2019-08-18 DIAGNOSIS — R600 Localized edema: Secondary | ICD-10-CM

## 2019-08-18 DIAGNOSIS — J3089 Other allergic rhinitis: Secondary | ICD-10-CM

## 2019-08-18 MED ORDER — POTASSIUM CHLORIDE ER 10 MEQ PO TBCR: EXTENDED_RELEASE_TABLET | ORAL | 3 refills | Status: DC

## 2019-08-18 MED ORDER — MONTELUKAST SODIUM 10 MG PO TABS: 10.0000 mg | ORAL_TABLET | Freq: Every day | ORAL | 3 refills | Status: AC

## 2019-08-18 MED ORDER — METOPROLOL TARTRATE 25 MG PO TABS: ORAL_TABLET | ORAL | 3 refills | Status: DC

## 2019-08-18 MED ORDER — LEVOCETIRIZINE DIHYDROCHLORIDE 5 MG PO TABS: 2.5000 mg | ORAL_TABLET | Freq: Every evening | ORAL | 3 refills | Status: DC

## 2019-08-19 MED ORDER — LORAZEPAM 0.5 MG PO TABS: 0.5000 mg | ORAL_TABLET | Freq: Every day | ORAL | 0 refills | Status: DC

## 2019-08-22 ENCOUNTER — Other Ambulatory Visit: Payer: Self-pay

## 2019-08-22 DIAGNOSIS — H35721 Serous detachment of retinal pigment epithelium, right eye: Secondary | ICD-10-CM | POA: Diagnosis not present

## 2019-08-22 DIAGNOSIS — H353122 Nonexudative age-related macular degeneration, left eye, intermediate dry stage: Secondary | ICD-10-CM | POA: Diagnosis not present

## 2019-08-22 DIAGNOSIS — H353231 Exudative age-related macular degeneration, bilateral, with active choroidal neovascularization: Secondary | ICD-10-CM | POA: Diagnosis not present

## 2019-08-24 ENCOUNTER — Ambulatory Visit (INDEPENDENT_AMBULATORY_CARE_PROVIDER_SITE_OTHER): Payer: Medicare HMO | Admitting: Endocrinology

## 2019-08-24 ENCOUNTER — Other Ambulatory Visit: Payer: Self-pay

## 2019-08-24 ENCOUNTER — Encounter: Payer: Self-pay | Admitting: Endocrinology

## 2019-08-24 VITALS — BP 132/64 | HR 65 | Temp 98.6°F | Wt 122.8 lb

## 2019-08-24 DIAGNOSIS — Z794 Long term (current) use of insulin: Secondary | ICD-10-CM | POA: Diagnosis not present

## 2019-08-24 DIAGNOSIS — K861 Other chronic pancreatitis: Secondary | ICD-10-CM | POA: Diagnosis not present

## 2019-08-24 DIAGNOSIS — Z8639 Personal history of other endocrine, nutritional and metabolic disease: Secondary | ICD-10-CM

## 2019-08-24 DIAGNOSIS — E1165 Type 2 diabetes mellitus with hyperglycemia: Secondary | ICD-10-CM | POA: Diagnosis not present

## 2019-08-24 LAB — COMPREHENSIVE METABOLIC PANEL
ALT: 19 U/L (ref 0–35)
AST: 26 U/L (ref 0–37)
Albumin: 4.3 g/dL (ref 3.5–5.2)
Alkaline Phosphatase: 66 U/L (ref 39–117)
BUN: 26 mg/dL — ABNORMAL HIGH (ref 6–23)
CO2: 29 mEq/L (ref 19–32)
Calcium: 10.3 mg/dL (ref 8.4–10.5)
Chloride: 100 mEq/L (ref 96–112)
Creatinine, Ser: 1.33 mg/dL — ABNORMAL HIGH (ref 0.40–1.20)
GFR: 37.58 mL/min — ABNORMAL LOW (ref >60.00)
Glucose, Bld: 50 mg/dL — ABNORMAL LOW (ref 70–99)
Potassium: 4.9 mEq/L (ref 3.5–5.1)
Sodium: 137 mEq/L (ref 135–145)
Total Bilirubin: 0.4 mg/dL (ref 0.2–1.2)
Total Protein: 7.3 g/dL (ref 6.0–8.3)

## 2019-08-24 LAB — MICROALBUMIN / CREATININE URINE RATIO
Creatinine,U: 22.5 mg/dL
Microalb Creat Ratio: 3.1 mg/g (ref 0.0–30.0)
Microalb, Ur: <0.7 mg/dL (ref 0.0–1.9)

## 2019-08-24 LAB — POCT GLYCOSYLATED HEMOGLOBIN (HGB A1C): Hemoglobin A1C: 6.2 % — AB (ref 4.0–5.6)

## 2019-08-24 LAB — LIPID PANEL
Cholesterol: 158 mg/dL (ref 0–200)
HDL: 50.5 mg/dL (ref >39.00)
LDL Cholesterol: 77 mg/dL (ref 0–99)
NonHDL: 107.87
Total CHOL/HDL Ratio: 3
Triglycerides: 153 mg/dL — ABNORMAL HIGH (ref 0.0–149.0)
VLDL: 30.6 mg/dL (ref 0.0–40.0)

## 2019-08-24 NOTE — Progress Notes (Signed)
Patient ID: Vanessa Gibson, female   DOB: 11-09-1928, 85 y.o.   MRN: [id number]           Reason for Appointment: Follow-up for Type 2 Diabetes   Referring physician: Drema [place]   History of Present Illness:          Date of diagnosis of type 2 diabetes mellitus: 1997        Background history:   She had been treated with metformin mostly since her diagnosis At some point was given glipizide/metformin combination, detailed history is not available from PCP Also not clear what level of control she has had until recently  Recent history:   Non-insulin hypoglycemic drugs the patient is taking are: none   INSULIN dose: Tresiba 18-20 units at night,  Novolog 6 units at breakfast and 8 at supper  Her A1c is now 6.2, previously higher at 7.6 done in February   Current management, blood sugar patterns and problems identified:   She overall has excellent control of her blood sugars without hypoglycemia  However she is checking her blood sugars only at breakfast and bedtime  She thinks that she is very consistent with taking her NovoLog before meals  However not able to estimate any change in the dosage based on her portions are carbohydrates  Has only about 4 readings above her bedtime target of 180  She does reduce her Tresiba by 1 to 2 units if her blood sugar is near normal or low normal at bedtime but does not adjust the dose based on her fasting readings  About 3 weeks ago she did have a high reading of 245 late afternoon but she has not done readings after lunch again  Her weight appears to be stable in the last 3 months  She is sometimes concerned about low normal blood sugars in the morning but lowest blood sugar is only 74  She is not very active        Side effects from medications have been: ?  Nausea from metformin  Compliance with the medical regimen: Fairly good  Hypoglycemia: None recently   Glucose monitoring:  done 1-2 times a day         Glucometer:   Accu-Chek  Blood Glucose readings from meter   PRE-MEAL Fasting Lunch Dinner Bedtime Overall  Glucose range:  74-145    74-220   Mean/median:  110    128  118   Previous readings:  PRE-MEAL Fasting Lunch Dinner Bedtime Overall  Glucose range:  102-138   109  83-164   Mean/median:      ?    Self-care: The diet that the patient has been following is: tries to limit sweets  She has her meals with assisted living facility     Typical meal intake: Breakfast is cereal, sometimes bacon.  Lunch: yogurt, soup, cottage cheese or peanut butter with apple, or other light meal usually           Dietician visit, most recent: Never CDE visit: 1/18                Weight history: Previous range 98-140   Wt Readings from Last 3 Encounters:  08/24/19 122 lb 12.8 oz (55.7 kg)  07/04/19 121 lb (54.9 kg)  05/23/19 121 lb 4 oz (55 kg)    Glycemic control:  A1c in October 2017 = 8.5    Lab Results  Component Value Date   HGBA1C 6.2 (A) 08/24/2019   HGBA1C 6.5 05/23/2019  HGBA1C 7.6 (H) 12/27/2018   Lab Results  Component Value Date   MICROALBUR <0.7 08/24/2019   LDLCALC 77 08/24/2019   CREATININE 1.33 (H) 08/24/2019   Lab Results  Component Value Date   MICRALBCREAT 3.1 08/24/2019       Allergies as of 08/24/2019   No Known Allergies     Medication List       Accurate as of August 24, 2019  3:54 PM. If you have any questions, ask your nurse or doctor.        Accu-Chek Guide test strip Generic drug: glucose blood CHECK BLOOD SUGAR 3 TO 4 TIMES DAILY.   acetaminophen 325 MG tablet Commonly known as: TYLENOL Take 2 tablets (650 mg total) by mouth every 6 (six) hours as needed for moderate pain or headache.   aspirin EC 81 MG tablet Take 81 mg by mouth daily.   azelastine 0.1 % nasal spray Commonly known as: ASTELIN Place 1 spray into both nostrils 2 (two) times daily. Use in each nostril as directed   B-D SINGLE USE SWABS REGULAR Pads USE TO CLEAN FINGER  BEFORE CHECKING BLOOD SUGARS AND CLEAN SKIN BEFORE INSULIN INJECTIONS.   BD Pen Needle Nano U/F 32G X 4 MM Misc Generic drug: Insulin Pen Needle USE TO INJECT INSULINS 4 TIMES DAILY.   celecoxib 50 MG capsule Commonly known as: CELEBREX TAKE (1) CAPSULE TWICE DAILY.   citalopram 10 MG tablet Commonly known as: CELEXA TAKE 1 TABLET ONCE DAILY.   fluticasone 50 MCG/ACT nasal spray Commonly known as: FLONASE Place 2 sprays into both nostrils daily.   furosemide 20 MG tablet Commonly known as: LASIX TAKE 1 TABLET ONCE DAILY.   Insulin Aspart FlexPen 100 UNIT/ML Sopn Inject 3-4 Units into the skin 2 (two) times a day. What changed:   how much to take  additional instructions   levocetirizine 5 MG tablet Commonly known as: Xyzal Take 0.5 tablets (2.5 mg total) by mouth every evening.   LORazepam 0.5 MG tablet Commonly known as: ATIVAN Take 1 tablet (0.5 mg total) by mouth at bedtime.   lovastatin 20 MG tablet Commonly known as: MEVACOR Take 1 tablet (20 mg total) by mouth at bedtime.   metoprolol tartrate 25 MG tablet Commonly known as: LOPRESSOR TAKE 1 TABLET BY MOUTH TWICE DAILY.   montelukast 10 MG tablet Commonly known as: SINGULAIR Take 1 tablet (10 mg total) by mouth at bedtime.   multivitamin-lutein Caps capsule Take 1 capsule by mouth daily.   HAIR/SKIN/NAILS/BIOTIN PO Take 1 tablet by mouth daily.   Centrum Silver 50+Women Tabs Take 1 tablet by mouth daily.   ofloxacin 0.3 % OTIC solution Commonly known as: FLOXIN INSTILL 5 DROPS IN EACH EAR ONCE A DAY.   Olopatadine HCl 0.2 % Soln Commonly known as: Pataday Apply 1 drop to each eye once daily.   omeprazole 40 MG capsule Commonly known as: PRILOSEC TAKE 1 CAPSULE EVERY DAY.   potassium chloride 10 MEQ tablet Commonly known as: KLOR-CON TAKE 1 TABLET EACH DAY.   Tyler Aas FlexTouch 100 UNIT/ML Sopn FlexTouch Pen Generic drug: insulin degludec INJECT 20 UNITS SUBCUTANEOUSLY ONE TIME DAILY    TRUEplus Lancets 28G Misc CHECK BLOOD SUGAR 3 TO 4 TIMES DAILY.   vitamin C 1000 MG tablet Take 1,000 mg by mouth daily.       Allergies: No Known Allergies  Past Medical History:  Diagnosis Date  . Anxiety   . Arthritis   . Asthma   . Breast  cancer ([place]), bilateral   . Chronic pancreatitis ([place])   . Depression   . Dizziness   . GERD (gastroesophageal reflux disease)   . History of hiatal hernia   . Hyperlipemia   . Hypertension   . IBS (irritable bowel syndrome)   . Mandibular fracture ([place]) 12/04/2015  . MI (myocardial infarction) ([place])   . Skin cancer of face     Past Surgical History:  Procedure Laterality Date  . BREAST SURGERY    . CATARACT EXTRACTION W/ INTRAOCULAR LENS  IMPLANT, BILATERAL Bilateral   . CHOLECYSTECTOMY OPEN  1970's  . MASTECTOMY Bilateral     History reviewed. No pertinent family history.  Social History:  reports that she has never smoked. She has never used smokeless tobacco. She reports that she does not drink alcohol or use drugs.   Review of Systems   Lipid history:  She is on lovastatin 20 mg from her PCP and is overdue for follow-up    Lab Results  Component Value Date   CHOL 158 08/24/2019   HDL 50.50 08/24/2019   LDLCALC 77 08/24/2019   TRIG 153.0 (H) 08/24/2019   CHOLHDL 3 08/24/2019           Hypertension:  She is only on metoprolol 25 mg, Has history of CAD  HYPERKALEMIA: No recurrence, is on Lasix and potassium No recent edema   Lab Results  Component Value Date   CREATININE 1.33 (H) 08/24/2019   BUN 26 (H) 08/24/2019   NA 137 08/24/2019   K 4.9 08/24/2019   CL 100 08/24/2019   CO2 29 08/24/2019    Most recent eye exam was recently, is starting to get some injections in her eyes  Most recent foot exam: 11/05/17  She has had chronic tingling and discomfort in her hands  She still has generalized pains    Physical Examination:  BP 132/64   Pulse 65   Temp 98.6 F (37 C)   Wt 122 lb 12.8 oz  (55.7 kg)   LMP  (LMP Unknown)   SpO2 96%   BMI 24.80 kg/m   Diabetic Foot Exam - Simple   Simple Foot Form Diabetic Foot exam was performed with the following findings: Yes   Visual Inspection No deformities, no ulcerations, no other skin breakdown bilaterally: Yes Sensation Testing Intact to touch and monofilament testing bilaterally: Yes Pulse Check Posterior Tibialis and Dorsalis pulse intact bilaterally: Yes Comments     No ankle edema present  ASSESSMENT:  Diabetes type 2, uncontrolled, nonobese, insulin-requiring  See history of present illness for detailed discussion of current diabetes management, blood sugar patterns and problems identified   She is on basal bolus insulin regimen with failure to respond to oral agents  Her A1c is excellent at 6.2  Her home blood sugars are recently averaging 118 with lowest reading 74 in the last month However considering her age and need to avoid hypoglycemia appears that her bedtime readings may be too tightly controlled    Foot exam: No signs of vascular disease or sensory loss  History of edema treated with Lasix and potassium, needs BMP checked  PLAN:   As above She will reduce her suppertime dose to 6 units of NovoLog and only take 8 units if she is eating a large meal or dessert. She did not continue to take Tresiba at night but reduce the dose to 18 units More readings after breakfast or lunch  Since she does not have any edema may  consider making Lasix as needed  Patient Instructions  Novolog 6 units at supper; with dessert or large meal take 8 Novoloh  Check blood sugars on waking up days a week  Also check blood sugars about 2 hours after meals and do this after different meals by rotation  Recommended blood sugar levels on waking up are 90-130 and about 2 hours after meal is 130-180  Please bring your blood sugar monitor to each visit, thank you         Elayne Snare 08/24/2019, 3:54 PM   Note:  This office note was prepared with Dragon voice recognition system technology. Any transcriptional errors that result from this process are unintentional.   Addendum: Potassium 4.9, recommend stopping potassium

## 2019-08-24 NOTE — Patient Instructions (Addendum)
Novolog 6 units at supper; with dessert or large meal take 8 Novoloh  Check blood sugars on waking up days a week  Also check blood sugars about 2 hours after meals and do this after different meals by rotation  Recommended blood sugar levels on waking up are 90-130 and about 2 hours after meal is 130-180  Please bring your blood sugar monitor to each visit, thank you

## 2019-08-25 ENCOUNTER — Telehealth: Payer: Self-pay

## 2019-08-25 NOTE — Telephone Encounter (Signed)
Copied from [place] [id number]. Topic: General - Other >> Aug 25, 2019 10:04 AM Celene Kras A wrote: Reason for CRM: Pt called stating Dr. Rosebud Poles is requesting to take pt off of some of her medications. Pt states she does not remember all that he said and is requesting to have her PCP call him to get more info. Pt is also requesting to know who her new physician will be when PCP leaves. Please advise.

## 2019-08-26 ENCOUNTER — Other Ambulatory Visit: Payer: Self-pay | Admitting: Family Medicine

## 2019-08-26 ENCOUNTER — Other Ambulatory Visit: Payer: Self-pay

## 2019-08-26 DIAGNOSIS — F339 Major depressive disorder, recurrent, unspecified: Secondary | ICD-10-CM

## 2019-08-26 DIAGNOSIS — R600 Localized edema: Secondary | ICD-10-CM

## 2019-08-26 DIAGNOSIS — K219 Gastro-esophageal reflux disease without esophagitis: Secondary | ICD-10-CM

## 2019-08-26 MED ORDER — FUROSEMIDE 20 MG PO TABS: 20.0000 mg | ORAL_TABLET | Freq: Every day | ORAL | 2 refills | Status: DC

## 2019-08-26 MED ORDER — CITALOPRAM HYDROBROMIDE 10 MG PO TABS: 10.0000 mg | ORAL_TABLET | Freq: Every day | ORAL | 2 refills | Status: DC

## 2019-08-27 ENCOUNTER — Telehealth: Payer: Self-pay | Admitting: Internal Medicine

## 2019-08-27 NOTE — Telephone Encounter (Signed)
Received a call from the team health access nurse with regards to patient's incorrect intake of novolog  She apparently missed her Novolog dose and by the  Next meal she took 20 units of Novolog (thinking it was [name] ) at 1830  She was advised by Primary Care on-call to monitor glucose at 30 minutes.    Her glucose currently is 110 mg/dl Pt is wondering is she should take tresiba tonight.    Pt was not aware that Dr. Dwyane Dee has cut her tresiba and novolog dose.   I have advised her to check her glucose every 30-60 minutes until midnight tonight and to eat something if she goes below 100 mg/dL  She was advised to take Tresiba at 14 units tonight but starting tomorrow night she should take 18 units based on Dr. Jodelle Green recommendations.    Pt and son [name] expressed understanding  Abby Nena Jordan, MD  [facility]  [facility] [address] [place], [place] [address] Phone: [phone] FAX: [phone]

## 2019-08-29 NOTE — Telephone Encounter (Signed)
See lab notes for more information

## 2019-08-30 ENCOUNTER — Other Ambulatory Visit: Payer: Self-pay | Admitting: Pharmacy Technician

## 2019-08-30 ENCOUNTER — Other Ambulatory Visit: Payer: Self-pay | Admitting: Pharmacist

## 2019-08-30 NOTE — Patient Outreach (Signed)
[facility]) Care Management  08/30/2019  Vanessa Gibson 1928/07/26 [id number]   Received in basket message from [facility] requesting application be remailed to patient's son Vanessa Gibson again.  Emailed blank [employer] application to [email or web address]  Will follow up with [employer] in 7-10 business days to confirm application has been received.  04-22-1995 [name] [place] Certified Pharmacy Technician [facility] Management Direct [phone]

## 2019-08-30 NOTE — Patient Outreach (Signed)
[place] [facility]) Care Management  [place] 08/30/2019  Vanessa Gibson [birth year] [id number]  Reason for call: f/u on patient assistance program application for Tresiba and Novolog -Applications have not yet been returned.   Outreach:  Unsuccessful telephone call attempt #1 to patient. HIPAA compliant voicemail left requesting a return call   Successful call to patient's son, [name].  [name] reports he may have accidentally deleted the application from his email.  He requests that application be re-sent to [email or web address].  He states he will try to complete application and return later this week or next week.   Plan:  Will update [facility] pharmacy technician to resend application and f/u per workflow  Ralene Bathe, PharmD, [place] [phone]

## 2019-08-30 NOTE — Telephone Encounter (Signed)
Last fill 05/25/19  #90/0 Last OV 07/04/19

## 2019-09-01 ENCOUNTER — Other Ambulatory Visit: Payer: Self-pay | Admitting: Pharmacy Technician

## 2019-09-01 NOTE — Patient Outreach (Signed)
[facility]) Care Management  09/01/2019  Vanessa Gibson May 27, 1928 [id number]   Received patient portion(s) of patient assistance application(s) for Novolog and [name]. Faxed completed application and required documents into [employer].  Will follow up with company(ies) in 7-10 business days to check status of application(s).  [name] [name] [place] Certified Pharmacy Technician [place] Management Direct Dial:[phone]

## 2019-09-05 ENCOUNTER — Telehealth: Payer: Self-pay | Admitting: Family Medicine

## 2019-09-05 NOTE — Telephone Encounter (Signed)
See note  Copied from [place] [id number]. Topic: General - Other >> Sep 05, 2019  3:31 PM Leward Quan A wrote: Reason for CRM: Patient called to say that she had a missed call she was wondering why. Please advise

## 2019-09-05 NOTE — Progress Notes (Signed)
LVM for pt to schedule.

## 2019-09-06 ENCOUNTER — Telehealth: Payer: Self-pay

## 2019-09-06 ENCOUNTER — Other Ambulatory Visit: Payer: Self-pay | Admitting: Pharmacy Technician

## 2019-09-06 NOTE — Telephone Encounter (Signed)
Call not from our office

## 2019-09-06 NOTE — Telephone Encounter (Signed)
Called pt and left a detailed voicemail with last note entered by the on call MD when pt spoke with her. Repeated what Dr. Kelton Pillar had instructed pt to do, and encouraged pt to call the office for clarification and future questions.

## 2019-09-06 NOTE — Telephone Encounter (Signed)
Patient called in stating she is not well and Dr told her to stop taking medication but she doesn't remember what medication   please advise

## 2019-09-06 NOTE — Patient Outreach (Signed)
[facility]) Care Management  09/06/2019  Vanessa Gibson 1928-03-28 [id number]    Follow up call placed to [employer] regarding patient assistance application(s) for Novolog and [name] states that patient application is still being processed due to needing proof of medicaid. Informed him that patient does not have medicaid but he states it was marked "Yes" on application. He states new copy of patient information needs to be submitted.  Updated form and faxed into [place]  Follow up:  Will follow up with company in 3-5 business days to check status.  [name] [name] [name] Certified Pharmacy Technician [place] Management Direct Dial:[phone]

## 2019-09-09 ENCOUNTER — Other Ambulatory Visit: Payer: Self-pay | Admitting: Endocrinology

## 2019-09-12 ENCOUNTER — Other Ambulatory Visit: Payer: Self-pay | Admitting: Endocrinology

## 2019-09-12 ENCOUNTER — Telehealth: Payer: Self-pay

## 2019-09-12 ENCOUNTER — Telehealth: Payer: Self-pay | Admitting: Family Medicine

## 2019-09-12 NOTE — Telephone Encounter (Signed)
Received fax from [employer] patient assistance foundation stating that the pt has been approved to receive medications from this date through November 30,2020.

## 2019-09-12 NOTE — Telephone Encounter (Signed)
Patient is calling to get a Transfer of Care with Dr. Orma Flaming and would like sooner appt than 10/2019.  Patient  Patient is a patient of Dr. Juleen China. Patient is diabetic. CB- [phone]

## 2019-09-12 NOTE — Telephone Encounter (Signed)
Patient is scheduled for 10/10/2019 at arrival time 10:45am. No further action required.

## 2019-09-12 NOTE — Telephone Encounter (Signed)
See note

## 2019-09-13 ENCOUNTER — Other Ambulatory Visit: Payer: Self-pay | Admitting: Pharmacy Technician

## 2019-09-13 NOTE — Patient Outreach (Signed)
[facility]) Care Management  09/13/2019  Vanessa Gibson [birth year] [id number]    Follow up call placed to [employer] regarding patient assistance application(s) for Novolog and Tresiba , automated system confirms patient has been approved for medications as of 10/19 until 11/24/19. Medication to arrive at providers office in 10-14 business days.  Follow up:  Will follow up with patient in 10-14 business days to confirm medications have been received.  [name] [name] [place] Certified Pharmacy Technician [place] Management Direct [phone]

## 2019-09-21 ENCOUNTER — Telehealth: Payer: Self-pay

## 2019-09-21 NOTE — Telephone Encounter (Signed)
Received shipment of Patient assitance medication. Inside delivery was 1 box of Novolog pens, (5)and two boxes of Tresiba insulin each box containing 5 pens. Also included was 4 boxes of insulin pen needles, each box containing 100 needles. Attempted to call pt and inform her of this. She did not answer and was not given the option to leave a voicemail.

## 2019-09-28 DIAGNOSIS — H353122 Nonexudative age-related macular degeneration, left eye, intermediate dry stage: Secondary | ICD-10-CM | POA: Diagnosis not present

## 2019-09-28 DIAGNOSIS — H353231 Exudative age-related macular degeneration, bilateral, with active choroidal neovascularization: Secondary | ICD-10-CM | POA: Diagnosis not present

## 2019-09-28 DIAGNOSIS — H35721 Serous detachment of retinal pigment epithelium, right eye: Secondary | ICD-10-CM | POA: Diagnosis not present

## 2019-10-07 ENCOUNTER — Other Ambulatory Visit: Payer: Self-pay | Admitting: Family Medicine

## 2019-10-07 DIAGNOSIS — H9203 Otalgia, bilateral: Secondary | ICD-10-CM

## 2019-10-07 NOTE — Telephone Encounter (Signed)
Not sure If you would like to refill?

## 2019-10-10 ENCOUNTER — Ambulatory Visit (INDEPENDENT_AMBULATORY_CARE_PROVIDER_SITE_OTHER): Payer: Medicare HMO

## 2019-10-10 ENCOUNTER — Ambulatory Visit (INDEPENDENT_AMBULATORY_CARE_PROVIDER_SITE_OTHER): Payer: Medicare HMO | Admitting: Family Medicine

## 2019-10-10 ENCOUNTER — Encounter: Payer: Self-pay | Admitting: Family Medicine

## 2019-10-10 ENCOUNTER — Other Ambulatory Visit: Payer: Self-pay

## 2019-10-10 VITALS — BP 122/62 | HR 68 | Temp 98.4°F | Ht 59.0 in | Wt 120.2 lb

## 2019-10-10 DIAGNOSIS — R0600 Dyspnea, unspecified: Secondary | ICD-10-CM | POA: Diagnosis not present

## 2019-10-10 DIAGNOSIS — R5383 Other fatigue: Secondary | ICD-10-CM

## 2019-10-10 DIAGNOSIS — Z711 Person with feared health complaint in whom no diagnosis is made: Secondary | ICD-10-CM

## 2019-10-10 DIAGNOSIS — M79642 Pain in left hand: Secondary | ICD-10-CM | POA: Diagnosis not present

## 2019-10-10 DIAGNOSIS — M79641 Pain in right hand: Secondary | ICD-10-CM

## 2019-10-10 DIAGNOSIS — M19042 Primary osteoarthritis, left hand: Secondary | ICD-10-CM | POA: Diagnosis not present

## 2019-10-10 LAB — COMPREHENSIVE METABOLIC PANEL
ALT: 16 U/L (ref 0–35)
AST: 23 U/L (ref 0–37)
Albumin: 4.2 g/dL (ref 3.5–5.2)
Alkaline Phosphatase: 58 U/L (ref 39–117)
BUN: 28 mg/dL — ABNORMAL HIGH (ref 6–23)
CO2: 28 mEq/L (ref 19–32)
Calcium: 9.6 mg/dL (ref 8.4–10.5)
Chloride: 101 mEq/L (ref 96–112)
Creatinine, Ser: 1.31 mg/dL — ABNORMAL HIGH (ref 0.40–1.20)
GFR: 38.23 mL/min — ABNORMAL LOW (ref >60.00)
Glucose, Bld: 60 mg/dL — ABNORMAL LOW (ref 70–99)
Potassium: 4.4 mEq/L (ref 3.5–5.1)
Sodium: 138 mEq/L (ref 135–145)
Total Bilirubin: 0.4 mg/dL (ref 0.2–1.2)
Total Protein: 7.1 g/dL (ref 6.0–8.3)

## 2019-10-10 LAB — CBC WITH DIFFERENTIAL/PLATELET
Basophils Absolute: 0.1 10*3/uL (ref 0.0–0.1)
Basophils Relative: 0.9 % (ref 0.0–3.0)
Eosinophils Absolute: 0.2 10*3/uL (ref 0.0–0.7)
Eosinophils Relative: 2.4 % (ref 0.0–5.0)
HCT: 35.7 % — ABNORMAL LOW (ref 36.0–46.0)
Hemoglobin: 12.1 g/dL (ref 12.0–15.0)
Lymphocytes Relative: 27.5 % (ref 12.0–46.0)
Lymphs Abs: 2.9 10*3/uL (ref 0.7–4.0)
MCHC: 33.9 g/dL (ref 30.0–36.0)
MCV: 95.3 fl (ref 78.0–100.0)
Monocytes Absolute: 0.9 10*3/uL (ref 0.1–1.0)
Monocytes Relative: 8.9 % (ref 3.0–12.0)
Neutro Abs: 6.4 10*3/uL (ref 1.4–7.7)
Neutrophils Relative %: 60.3 % (ref 43.0–77.0)
Platelets: 272 10*3/uL (ref 150.0–400.0)
RBC: 3.75 Mil/uL — ABNORMAL LOW (ref 3.87–5.11)
RDW: 12.6 % (ref 11.5–15.5)
WBC: 10.6 10*3/uL — ABNORMAL HIGH (ref 4.0–10.5)

## 2019-10-10 LAB — VITAMIN D 25 HYDROXY (VIT D DEFICIENCY, FRACTURES): VITD: 47.96 ng/mL (ref 30.00–100.00)

## 2019-10-10 LAB — TSH: TSH: 1.56 u[IU]/mL (ref 0.35–4.50)

## 2019-10-10 NOTE — Progress Notes (Signed)
Patient: Vanessa Gibson MRN: [id number] DOB: [birth year] PCP: Briscoe Deutscher, DO     Subjective:  Chief Complaint  Patient presents with  . Transitions Of Care  . Shortness of Breath    HPI: The patient is a 85 y.o. female who presents today for transition of care from [place]. She has complaints of shortness of breath today. She also has new retina issues and is getting shots in her eyes. She can't see very well. She also feels fatigued and dizziness since getting her eye shots. The eye doctor told her that this was normal.  She also has a lot of complaints. Discussed we will address her shortness of breath and fatigue today and hands. Her hand complaints were already addressed with dr. Juleen China prior and she refused treatment. She has been short of breath for a long time. She has also been fatigued for a long time. Nothing has changed regarding these symptoms. She feels like her leg edema is the same. She has no new cough or orthopnea. Looking at past imaging her last echo showed mildly increased PA pressures in 2016.   -she is still taking her potassium and lasix.   -she also feels like something is wrong with her hands, but again saw dr. Juleen China for this. She has pain under her nails and then feels like her hands fall asleep. History is extremely difficult and changes. She states this has been going on x 2 years and feels like it's getting worse. She states she has swelling in her hands as well. She has pain in her legs, but no other place/joints. No known family history of RA in her family.   Review of Systems  Constitutional: Positive for fatigue. Negative for fever and unexpected weight change.  Eyes: Positive for visual disturbance.  Respiratory: Positive for shortness of breath. Negative for cough and wheezing.   Cardiovascular: Positive for leg swelling. Negative for chest pain and palpitations.  Gastrointestinal: Positive for nausea. Negative for abdominal pain, diarrhea and  vomiting.  Musculoskeletal: Positive for arthralgias.  Psychiatric/Behavioral: Negative for sleep disturbance.    Allergies Patient has No Known Allergies.  Past Medical History Patient  has a past medical history of Anxiety, Arthritis, Asthma, Breast cancer ([place]), bilateral, Chronic pancreatitis ([place]), Depression, Dizziness, GERD (gastroesophageal reflux disease), History of hiatal hernia, Hyperlipemia, Hypertension, IBS (irritable bowel syndrome), Mandibular fracture ([place]) (12/04/2015), MI (myocardial infarction) ([place]), and Skin cancer of face.  Surgical History Patient  has a past surgical history that includes Cholecystectomy open (1970's); Breast surgery; Mastectomy (Bilateral); and Cataract extraction w/ intraocular lens  implant, bilateral (Bilateral).  Family History [name]'s family history is not on file.  Social History Patient  reports that she has never smoked. She has never used smokeless tobacco. She reports that she does not drink alcohol or use drugs.    Objective: Vitals:   10/10/19 1104  BP: 122/62  Pulse: 68  Temp: 98.4 F (36.9 C)  TempSrc: Skin  SpO2: 94%  Weight: 120 lb 3.2 oz (54.5 kg)  Height: 4\' 11"  (1.499 m)    Body mass index is 24.28 kg/m.  Physical Exam Vitals signs reviewed.  Constitutional:      Appearance: She is well-developed.  HENT:     Head: Normocephalic and atraumatic.     Comments: TM pearly with light reflex bilaterally. Both ear exams normal.  Neck:     Musculoskeletal: Normal range of motion and neck supple.     Thyroid: No thyromegaly.  Cardiovascular:  Rate and Rhythm: Normal rate and regular rhythm.  Pulmonary:     Effort: Pulmonary effort is normal.     Breath sounds: Normal breath sounds. No decreased breath sounds.  Abdominal:     General: Bowel sounds are normal.     Palpations: Abdomen is soft. There is mass (+hernia ).  Musculoskeletal:     Right lower leg: Edema (trace pedal ) present.     Left lower leg:  Edema (trace pedal ) present.     Comments: Hand exam normal bilaterally. Area of concern are normal fat pads on each of her MCP joints. No edema/knots, contractions/etc. No obvious swelling in any of her hand joints will full range of motion and hand grip.   Lymphadenopathy:     Cervical: No cervical adenopathy.  Skin:    General: Skin is warm.  Neurological:     General: No focal deficit present.     Mental Status: She is alert.        Assessment/plan: 1. Dyspnea, unspecified type Check echo to make sure no worsening pulmonary pressures/concern for PH.  - ECHOCARDIOGRAM COMPLETE; Future  2. Other fatigue Chronic issue and she is worried well regarding this. Will check labs again, check echo and really encouraged she start some form of exercise program as sedentary lifestyle only increases fatigue.  - Comprehensive metabolic panel - CBC with Differential/Platelet - TSH - VITAMIN D 25 Hydroxy (Vit-D Deficiency, Fractures)  3. Bilateral hand pain No concerning symptoms on exam. Agree with previous pcp that symptoms more consistent with neuropathy, but she refuses to acknowledge this or accept this and doesn't think she has this. Doesn't understand why an xray hasn't been done, so will check this today although nothing concerning on exam or history. likely has some OA as well. PCP attempted to work up neuropathy but it looks like she didn't do emg.labs all normal. States, "I have been to every single doctor and no one can figure out what is wrong with me and all think that I am crazy."   4. Worried well     Return in about 3 months (around 01/10/2020).   Orma Flaming, MD [place]   10/10/2019

## 2019-10-10 NOTE — Patient Instructions (Addendum)
-  need to get echo of your heart to make sure you do not have pulmonary hypertension as it was slightly elevated in 2016. Can cause swelling, shortness of breath and fatigue. They will call you for this.   -labs today  -xrays of hands and labs, but history seems consistent with neuropathy like dr. Juleen China discussed with you.   -if ear continues to hurt, will need to see ear doctor as your exam is normal and do not want to continue to prescribe medication that you do not need.   -can continue with your potassium and lasix.  -would make sure you calcium dose is not great than 600mg  with decreased kidney function

## 2019-10-11 LAB — RHEUMATOID FACTOR: Rheumatoid fact SerPl-aCnc: <14 IU/mL (ref <14)

## 2019-10-12 ENCOUNTER — Other Ambulatory Visit: Payer: Self-pay | Admitting: Family Medicine

## 2019-10-12 ENCOUNTER — Telehealth: Payer: Self-pay | Admitting: Family Medicine

## 2019-10-12 DIAGNOSIS — G4700 Insomnia, unspecified: Secondary | ICD-10-CM

## 2019-10-12 MED ORDER — LORAZEPAM 0.5 MG PO TABS: 0.5000 mg | ORAL_TABLET | Freq: Every day | ORAL | 0 refills | Status: DC

## 2019-10-12 NOTE — Telephone Encounter (Signed)
See note  Copied from [place] [id number]. Topic: General - Other >> Oct 12, 2019  9:51 AM Antonieta Iba C wrote: Reason for CRM: pt called in for her lab results, please advise pt.   CB [phone]

## 2019-10-13 ENCOUNTER — Other Ambulatory Visit: Payer: Self-pay | Admitting: Family Medicine

## 2019-10-13 DIAGNOSIS — K219 Gastro-esophageal reflux disease without esophagitis: Secondary | ICD-10-CM

## 2019-10-13 NOTE — Telephone Encounter (Signed)
Last OV 10/10/19 Last refill 08/30/19 #90/0 Next OV 01/11/20

## 2019-10-13 NOTE — Telephone Encounter (Signed)
See note  Copied from [place] [id number]. Topic: General - Other >> Oct 12, 2019  9:51 AM Antonieta Iba C wrote: Reason for CRM: pt called in for her lab results, please advise pt.   CB [id number] >> Oct 13, 2019 10:48 AM Robina Ade, Helene Kelp D wrote: Patient calling to get her labs and x-ray results, please call patient back, thanks.

## 2019-10-13 NOTE — Telephone Encounter (Signed)
Patient called in to to rush her current medication . Advised patient medication is in a pending status and waiting for approval from Dr Rogers Blocker . Please advise

## 2019-10-13 NOTE — Telephone Encounter (Signed)
See note

## 2019-10-14 ENCOUNTER — Other Ambulatory Visit: Payer: Self-pay | Admitting: Family Medicine

## 2019-10-14 ENCOUNTER — Ambulatory Visit: Payer: Self-pay | Admitting: *Deleted

## 2019-10-14 MED ORDER — DICLOFENAC SODIUM 1 % EX GEL: 4.0000 g | Freq: Four times a day (QID) | CUTANEOUS | 0 refills | Status: DC

## 2019-10-14 NOTE — Progress Notes (Signed)
vo

## 2019-10-14 NOTE — Telephone Encounter (Signed)
See below

## 2019-10-14 NOTE — Telephone Encounter (Signed)
Requesting lab and xray results: Patient notified of the following results. Patient wants to know if there is anything else she can do for her hand pain - she is already getting injection and does not want more. Would topical help at all?  Notes recorded by Orma Flaming, MD on 10/12/2019 at 1:53 PM EST  Please let her know the following.Marland KitchenMarland Kitchen  1) her rheumatoid factor is negative for rheumatoid arthritis, again xrays shows osteoarthritis.  2) sugar is low, could cause her fatigue. Make sure eating small meals throughout the day.  3) thyroid and vitamin D is normal  4) cbc is normal, no anemia  5) renal function is the same... you need to stop taking your celebrex with decreased renal function. Tylenol only for pain. No ibuprofen, celebrex, advil, etc...   Have a wonderful thanksgiving and I will let you know when echo comes back.  Dr. Rogers Blocker   Notes recorded by Orma Flaming, MD on 10/12/2019 at 8:06 AM EST  Please let her know her hand xrays shows arthritis/degenertive changes in both hands, but no other acute or pathological finding. Also shows age related osteoporosis. Treat conservatively or if hands hurt we can send to ortho for possible steroid injections.  Dr. Rogers Blocker  Reason for Disposition . Caller requesting lab results  Protocols used: PCP CALL - NO TRIAGE-A-AH

## 2019-10-14 NOTE — Telephone Encounter (Signed)
Please see message below and advise.

## 2019-10-14 NOTE — Telephone Encounter (Signed)
Ill send her in a topical cream called voltaren gel that she can apply to her hand up to four times a day for pain.  Orma Flaming, MD [place]

## 2019-10-14 NOTE — Telephone Encounter (Signed)
Spoke to patient and advised of notes per Dr. Rogers Blocker.  She verbalized understanding.

## 2019-10-17 ENCOUNTER — Other Ambulatory Visit: Payer: Self-pay | Admitting: Pharmacy Technician

## 2019-10-17 ENCOUNTER — Other Ambulatory Visit: Payer: Self-pay | Admitting: Pharmacist

## 2019-10-17 NOTE — Patient Outreach (Signed)
[facility]) Care Management  [place] 10/17/2019  Analynn Daum 1928-02-22 [id number]  Patient has been approved for [name] and Novolog patient assistance program(s).  Patient has been instructed on how to order refills and renewal process for 2021.    -Will close [place] case as goals of care have been met.  -Thank you for allowing [facility] pharmacy to be involved in this patient's care.    Ralene Bathe, PharmD, [place] [phone]

## 2019-10-17 NOTE — Patient Outreach (Signed)
[facility]) Care Management  10/17/2019  Kerly Wears 06-27-1928 [id number]    Successful call placed to patient regarding patient assistance medication delivery of Tresiba and Novolog, unable to verify HIPAA identifiers due to patient not being aware of who I am. Patient requested I contact her son in law [name].     Successful call placed to patients son in law, [name] regarding patient assistance medication delivery of Tresiba and Novolog, HIPAA identifiers verified. Sharee Pimple was able to confirm that both medications had been delivered to Dr. Ronnie Derby office but is not sure if Ms. Craghead had gone to pick it up yet. Informed him that we can restart application process in January and requested that he contact me if they would like to start the process. Provided him with my contact information.  Follow up:  Will route note to [place] for case closure  [name]. [name] [place] Certified Pharmacy Technician [place] Management Direct [phone]

## 2019-10-18 ENCOUNTER — Telehealth: Payer: Self-pay | Admitting: Family Medicine

## 2019-10-18 NOTE — Telephone Encounter (Signed)
Patient called and would like to talk to Dr. Rogers Blocker or her CMA about where she needs to go for her echocardiogram. Please call patient back, thanks.

## 2019-10-18 NOTE — Telephone Encounter (Signed)
See below

## 2019-10-19 NOTE — Telephone Encounter (Signed)
Spoke with patient and advised her that her echocardiogram would be done @ [facility] and that they would contact her to schedule.  I did give her contact # and let her know she has the option to call them to schedule since they may not contact her until after the holiday.  She verbalized understanding.

## 2019-11-02 DIAGNOSIS — H35721 Serous detachment of retinal pigment epithelium, right eye: Secondary | ICD-10-CM | POA: Diagnosis not present

## 2019-11-02 DIAGNOSIS — H353122 Nonexudative age-related macular degeneration, left eye, intermediate dry stage: Secondary | ICD-10-CM | POA: Diagnosis not present

## 2019-11-02 DIAGNOSIS — H353231 Exudative age-related macular degeneration, bilateral, with active choroidal neovascularization: Secondary | ICD-10-CM | POA: Diagnosis not present

## 2019-11-07 ENCOUNTER — Other Ambulatory Visit: Payer: Self-pay | Admitting: Family Medicine

## 2019-11-07 DIAGNOSIS — R600 Localized edema: Secondary | ICD-10-CM

## 2019-11-09 ENCOUNTER — Other Ambulatory Visit: Payer: Self-pay

## 2019-11-09 ENCOUNTER — Ambulatory Visit (HOSPITAL_COMMUNITY): Payer: Medicare HMO | Attending: Cardiology

## 2019-11-09 DIAGNOSIS — R0600 Dyspnea, unspecified: Secondary | ICD-10-CM | POA: Diagnosis not present

## 2019-11-09 HISTORY — PX: TRANSTHORACIC ECHOCARDIOGRAM: SHX275

## 2019-11-15 ENCOUNTER — Other Ambulatory Visit: Payer: Self-pay | Admitting: Family Medicine

## 2019-11-15 DIAGNOSIS — H9203 Otalgia, bilateral: Secondary | ICD-10-CM

## 2019-11-29 ENCOUNTER — Telehealth: Payer: Self-pay | Admitting: Family Medicine

## 2019-11-29 ENCOUNTER — Other Ambulatory Visit: Payer: Self-pay | Admitting: Endocrinology

## 2019-11-29 NOTE — Telephone Encounter (Signed)
Pt called stating she is experiencing multiple problems and wants to see Dr. Rogers Blocker as soon as possible. Pt stated her ankles are swollen and her blood sugar is high. Pt wants to know if she can be seen next Monday or Wednesday. Please advise.

## 2019-11-30 NOTE — Telephone Encounter (Signed)
Paitent called back and stated she thought her appt was at 1:20pm and I told her she was schedule at 11:20am. Patient wants to know if she can be worked in that afternoon she has an eye appt that morning.

## 2019-12-01 NOTE — Telephone Encounter (Signed)
*  Late documentation* I called patient back and advised that we did have an opening on 1/13 @ 1 pm but could not guarantee that this appt slot would still be available when she called back.

## 2019-12-02 ENCOUNTER — Other Ambulatory Visit: Payer: Self-pay

## 2019-12-06 ENCOUNTER — Ambulatory Visit (INDEPENDENT_AMBULATORY_CARE_PROVIDER_SITE_OTHER): Payer: Medicare HMO | Admitting: Endocrinology

## 2019-12-06 ENCOUNTER — Encounter: Payer: Self-pay | Admitting: Endocrinology

## 2019-12-06 VITALS — BP 130/62 | HR 71 | Ht 59.0 in | Wt 123.4 lb

## 2019-12-06 DIAGNOSIS — N289 Disorder of kidney and ureter, unspecified: Secondary | ICD-10-CM

## 2019-12-06 DIAGNOSIS — E1165 Type 2 diabetes mellitus with hyperglycemia: Secondary | ICD-10-CM | POA: Diagnosis not present

## 2019-12-06 DIAGNOSIS — Z794 Long term (current) use of insulin: Secondary | ICD-10-CM | POA: Diagnosis not present

## 2019-12-06 LAB — POCT GLYCOSYLATED HEMOGLOBIN (HGB A1C): Hemoglobin A1C: 6.6 % — AB (ref 4.0–5.6)

## 2019-12-06 NOTE — Patient Instructions (Addendum)
Check blood sugars on waking up 4-5  days a week  Also check blood sugars about 2 hours after meals and do this after different meals by rotation  Recommended blood sugar levels on waking up are 90-130 and about 2-3 hours after meal is 130-180  Please bring your blood sugar monitor to each visit, thank you  Take NOVOLOG shot right AFTER THE MEAL not 30-60 min before

## 2019-12-06 NOTE — Progress Notes (Signed)
Patient ID: Vanessa Gibson, female   DOB: June 29, 1928, 86 y.o.   MRN: [id number]           Reason for Appointment: Follow-up for Type 2 Diabetes   History of Present Illness:          Date of diagnosis of type 2 diabetes mellitus: 1997        Background history:   She had been treated with metformin mostly since her diagnosis At some point was given glipizide/metformin combination, detailed history is not available from PCP Also not clear what level of control she has had until recently  Recent history:   Non-insulin hypoglycemic drugs the patient is taking are: none   INSULIN dose: Tresiba 18-20 units at night,  Novolog 6 units at breakfast and 8 at supper  Her A1c is now 6.6  2, previously higher at 7.6 done in February   Current management, blood sugar patterns and problems identified:   She has had overall higher blood sugars  She thinks she is having high reading because of getting shots in her eyes monthly over the last 4 months or so  She checks her blood sugars 3 times a day but in the evening they are checked about 5 hours or more after dinner  Fasting readings are also relatively higher although they fluctuate and recently mostly around 130-140 range  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  Nausea from metformin  Compliance with the medical regimen: Fairly good  Hypoglycemia: None recently   Glucose monitoring:  done 1-2 times a day         Glucometer:  Accu-Chek  Blood Glucose readings from meter   PRE-MEAL Fasting Lunch Dinner Bedtime Overall  Glucose range: 107-164   110-272  102-258   Mean/median:  134   180  182 166   POST-MEAL PC Breakfast PC Lunch PC Dinner  Glucose range:    331  Mean/median:      PREVIOUS readings:  PRE-MEAL Fasting Lunch Dinner Bedtime Overall  Glucose range:  74-145    74-220   Mean/median:  110    128  118     Self-care: The diet that the patient has been following is: tries to limit sweets  She has her meals with assisted living facility     Typical meal intake: Breakfast is cereal, sometimes bacon.  Lunch: yogurt, soup, cottage cheese or peanut butter with apple, or other light meal usually           Dietician visit, most recent: Never CDE visit: 1/18                Weight history: Previous range 98-140   Wt Readings from Last 3 Encounters:  12/06/19 123 lb 6.4 oz (56 kg)  10/10/19 120 lb 3.2 oz (54.5 kg)  08/24/19 122 lb 12.8 oz (55.7 kg)    Glycemic control:  A1c in October 2017 = 8.5    Lab Results  Component Value Date   HGBA1C 6.6 (A) 12/06/2019   HGBA1C 6.2 (A) 08/24/2019   HGBA1C 6.5 05/23/2019   Lab Results  Component Value Date   MICROALBUR <0.7 08/24/2019   [place] 77 08/24/2019  CREATININE 1.31 (H) 10/10/2019   Lab Results  Component Value Date   MICRALBCREAT 3.1 08/24/2019       Allergies as of 12/06/2019   No Known Allergies     Medication List       Accurate as of December 06, 2019  4:56 PM. If you have any questions, ask your nurse or doctor.        STOP taking these medications   celecoxib 50 MG capsule Commonly known as: CELEBREX Stopped by: Elayne Snare, MD   ofloxacin 0.3 % OTIC solution Commonly known as: FLOXIN Stopped by: Elayne Snare, MD     TAKE these medications   Accu-Chek Guide test strip Generic drug: glucose blood CHECK BLOOD SUGAR THREE TO FOUR TIMES DAILY   acetaminophen 325 MG tablet Commonly known as: TYLENOL Take 2 tablets (650 mg total) by mouth every 6 (six) hours as needed for moderate pain or headache.   aspirin EC 81 MG tablet Take 81 mg by mouth daily.   azelastine 0.1 % nasal spray Commonly known as: ASTELIN Place 1 spray into both nostrils 2 (two) times daily. Use in each nostril as  directed   B-D SINGLE USE SWABS REGULAR Pads USE TO CLEAN FINGER BEFORE CHECKING BLOOD SUGARS AND CLEAN SKIN BEFORE INSULIN INJECTIONS.   BD Pen Needle Nano U/F 32G X 4 MM Misc Generic drug: Insulin Pen Needle USE TO INJECT INSULINS 4 TIMES DAILY.   citalopram 10 MG tablet Commonly known as: CELEXA Take 1 tablet (10 mg total) by mouth daily.   diclofenac Sodium 1 % Gel Commonly known as: Voltaren Apply 4 g topically 4 (four) times daily.   fluticasone 50 MCG/ACT nasal spray Commonly known as: FLONASE Place 2 sprays into both nostrils daily.   furosemide 20 MG tablet Commonly known as: LASIX TAKE 1 TABLET ONCE DAILY.   Insulin Aspart FlexPen 100 UNIT/ML Sopn Inject 3-4 Units into the skin 2 (two) times a day. What changed:   how much to take  additional instructions   levocetirizine 5 MG tablet Commonly known as: Xyzal Take 0.5 tablets (2.5 mg total) by mouth every evening.   LORazepam 0.5 MG tablet Commonly known as: ATIVAN Take 1 tablet (0.5 mg total) by mouth at bedtime.   lovastatin 20 MG tablet Commonly known as: MEVACOR Take 1 tablet (20 mg total) by mouth at bedtime.   metoprolol tartrate 25 MG tablet Commonly known as: LOPRESSOR TAKE 1 TABLET BY MOUTH TWICE DAILY.   montelukast 10 MG tablet Commonly known as: SINGULAIR Take 1 tablet (10 mg total) by mouth at bedtime.   multivitamin-lutein Caps capsule Take 1 capsule by mouth daily.   HAIR/SKIN/NAILS/BIOTIN PO Take 1 tablet by mouth daily.   Centrum Silver 50+Women Tabs Take 1 tablet by mouth daily.   Olopatadine HCl 0.2 % Soln Commonly known as: Pataday Apply 1 drop to each eye once daily.   omeprazole 40 MG capsule Commonly known as: PRILOSEC TAKE 1 CAPSULE EVERY DAY   potassium chloride 10 MEQ tablet Commonly known as: KLOR-CON TAKE 1 TABLET EACH DAY.   Tyler Aas FlexTouch 100 UNIT/ML Sopn FlexTouch Pen Generic drug: insulin degludec INJECT 20 UNITS SUBCUTANEOUSLY ONE TIME DAILY    TRUEplus Lancets 28G Misc CHECK BLOOD SUGAR 3 TO 4 TIMES DAILY.   vitamin C 1000 MG tablet Take 1,000 mg by mouth daily.       Allergies: No Known Allergies  Past Medical History:  Diagnosis Date  . Anxiety   . Arthritis   .  Asthma   . Breast cancer ([place]), bilateral   . Chronic pancreatitis ([place])   . Depression   . Dizziness   . GERD (gastroesophageal reflux disease)   . History of hiatal hernia   . Hyperlipemia   . Hypertension   . IBS (irritable bowel syndrome)   . Mandibular fracture ([place]) 12/04/2015  . MI (myocardial infarction) ([place])   . Skin cancer of face     Past Surgical History:  Procedure Laterality Date  . BREAST SURGERY    . CATARACT EXTRACTION W/ INTRAOCULAR LENS  IMPLANT, BILATERAL Bilateral   . CHOLECYSTECTOMY OPEN  1970's  . MASTECTOMY Bilateral     History reviewed. No pertinent family history.  Social History:  reports that she has never smoked. She has never used smokeless tobacco. She reports that she does not drink alcohol or use drugs.   Review of Systems   Lipid history:  She is on lovastatin 20 mg from her PCP with labs as follows:    Lab Results  Component Value Date   CHOL 158 08/24/2019   HDL 50.50 08/24/2019   LDLCALC 77 08/24/2019   TRIG 153.0 (H) 08/24/2019   CHOLHDL 3 08/24/2019           Hypertension:  She is only on metoprolol 25 mg, Has history of CAD  HYPERKALEMIA: No recurrence, is on Lasix and potassium Has borderline renal dysfunction as before  She will sometimes have swelling of her feet   Lab Results  Component Value Date   CREATININE 1.31 (H) 10/10/2019   BUN 28 (H) 10/10/2019   NA 138 10/10/2019   K 4.4 10/10/2019   CL 101 10/10/2019   CO2 28 10/10/2019    Eye exam report not available, is continuing to get some injections in her eyes with some visual difficulties   She has had chronic tingling and discomfort in her hands  She still has multiple aches and pains  Foot exam was done in 9/20    Physical Examination:  BP 130/62 (BP Location: Left Arm, Patient Position: Sitting, Cuff Size: Normal)   Pulse 71   Ht 4\' 11"  (1.499 m)   Wt 123 lb 6.4 oz (56 kg)   LMP  (LMP Unknown)   SpO2 97%   BMI 24.92 kg/m    Mild swelling of the feet present  ASSESSMENT:  Diabetes type 2, uncontrolled, nonobese, insulin-requiring  See history of present illness for detailed discussion of current diabetes management, blood sugar patterns and problems identified   She is on basal bolus insulin regimen with failure to respond to oral agents  Her A1c is slightly higher at 6.6  However her home blood sugars appear to be significantly higher than before Currently monitoring blood sugars before breakfast and dinner and bedtime  Blood sugar however show significant variability at all times and no consistent pattern Although she had started her eye injections with the ophthalmologist previously she thinks they are increasing her sugar Her diet is quite variable  RENAL dysfunction: This is mild and stable, since she will be following up with her PCP tomorrow will defer any labs to her  PLAN:    No change in insulin regimen as yet Reminded her to check her sugars about 2 hours after dinner rather than 5 hours later at about 10:30 PM If she has consistently high readings around 7-8 PM will increase her suppertime NovoLog For now since morning sugars are only mildly increased will not increase her basal insulin as  yet  Also discussed timing of her insulin: She is apparently taking her insulin up to 1 hour before her mealtime because she wants to do it in her room before going to the dining area Discussed that this is risking hypoglycemia and it may be better to do it right after she finishes her meal and goes back to her room No change in [name] timing and she does not have to check her sugar at the time of the injection  Patient Instructions  Check blood sugars on waking up 4-5  days a  week  Also check blood sugars about 2 hours after meals and do this after different meals by rotation  Recommended blood sugar levels on waking up are 90-130 and about 2-3 hours after meal is 130-180  Please bring your blood sugar monitor to each visit, thank you  Take NOVOLOG shot right AFTER THE MEAL not 30-60 min before       Elayne Snare 12/06/2019, 4:56 PM   Note: This office note was prepared with Dragon voice recognition system technology. Any transcriptional errors that result from this process are unintentional.   Addendum: Potassium 4.9, recommend stopping potassium

## 2019-12-07 ENCOUNTER — Ambulatory Visit: Payer: Medicare HMO | Admitting: Family Medicine

## 2019-12-07 ENCOUNTER — Encounter: Payer: Self-pay | Admitting: Family Medicine

## 2019-12-07 ENCOUNTER — Ambulatory Visit (INDEPENDENT_AMBULATORY_CARE_PROVIDER_SITE_OTHER): Payer: Medicare HMO | Admitting: Family Medicine

## 2019-12-07 VITALS — BP 124/64 | HR 63 | Temp 97.4°F | Ht 59.0 in | Wt 123.0 lb

## 2019-12-07 DIAGNOSIS — R350 Frequency of micturition: Secondary | ICD-10-CM | POA: Diagnosis not present

## 2019-12-07 DIAGNOSIS — R002 Palpitations: Secondary | ICD-10-CM

## 2019-12-07 DIAGNOSIS — H353231 Exudative age-related macular degeneration, bilateral, with active choroidal neovascularization: Secondary | ICD-10-CM | POA: Diagnosis not present

## 2019-12-07 DIAGNOSIS — R5382 Chronic fatigue, unspecified: Secondary | ICD-10-CM | POA: Diagnosis not present

## 2019-12-07 DIAGNOSIS — H353122 Nonexudative age-related macular degeneration, left eye, intermediate dry stage: Secondary | ICD-10-CM | POA: Diagnosis not present

## 2019-12-07 DIAGNOSIS — H35721 Serous detachment of retinal pigment epithelium, right eye: Secondary | ICD-10-CM | POA: Diagnosis not present

## 2019-12-07 LAB — HM DIABETES EYE EXAM

## 2019-12-07 MED ORDER — METOPROLOL TARTRATE 25 MG PO TABS: 12.5000 mg | ORAL_TABLET | Freq: Two times a day (BID) | ORAL | 3 refills | Status: AC

## 2019-12-07 NOTE — Patient Instructions (Addendum)
Metoprolol im going to decrease to 1/2 a pill twice a day. This drug can make you tired.   Stop your lasix pill and your potassium pill!!!! This makes you pee more.    I would stop vitamin C and your biotin pill. Just take your multivitamin.   I would wear compression hose for your legs  See you in TWO weeks!

## 2019-12-07 NOTE — Progress Notes (Signed)
Patient: Vanessa Gibson MRN: [id number] DOB: 04-27-1928 PCP: Orma Flaming, MD     Subjective:  Chief Complaint  Patient presents with  . Fatigue    HPI: The patient is a 86 y.o. female who presents today for fatigue. This is a chronic issue. We have worked this up multiple times in the past. She has goes to sleep at midnight. She states she has ghosts in her room. She states they keep her up all night. She wakes up every hour. She also states she has a severe bladder problem that doesn't help. She has no bladder issue in her chart. She states she does and will have urinary incontinence (sounds like urgency). She takes naps during the day. She is on lasix daily for leg swelling which likely is not helping her with her bathroom issues and sleep issues.   She also feels like her heart is palpating. Labs all normal and echo just done.   Review of Systems  Constitutional: Positive for fatigue.  Respiratory: Negative for shortness of breath.   Cardiovascular: Positive for palpitations. Negative for chest pain.  Gastrointestinal: Positive for nausea. Negative for abdominal pain.  Neurological: Negative for dizziness and headaches.  Psychiatric/Behavioral: Positive for sleep disturbance.    Allergies Patient has No Known Allergies.  Past Medical History Patient  has a past medical history of Anxiety, Arthritis, Asthma, Breast cancer ([place]), bilateral, Chronic pancreatitis ([place]), Depression, Dizziness, GERD (gastroesophageal reflux disease), History of hiatal hernia, Hyperlipemia, Hypertension, IBS (irritable bowel syndrome), Mandibular fracture ([place]) (12/04/2015), MI (myocardial infarction) ([place]), and Skin cancer of face.  Surgical History Patient  has a past surgical history that includes Cholecystectomy open (1970's); Breast surgery; Mastectomy (Bilateral); and Cataract extraction w/ intraocular lens  implant, bilateral (Bilateral).  Family History [name]'s family history is not on  file.  Social History Patient  reports that she has never smoked. She has never used smokeless tobacco. She reports that she does not drink alcohol or use drugs.    Objective: Vitals:   12/07/19 1308  BP: 124/64  Pulse: 63  Temp: (!) 97.4 F (36.3 C)  TempSrc: Temporal  SpO2: 97%  Weight: 123 lb (55.8 kg)  Height: 4\' 11"  (1.499 m)    Body mass index is 24.84 kg/m.  Physical Exam Vitals reviewed.  Constitutional:      Appearance: Normal appearance. She is normal weight.  HENT:     Head: Normocephalic and atraumatic.  Cardiovascular:     Rate and Rhythm: Normal rate and regular rhythm.     Heart sounds: Normal heart sounds.  Pulmonary:     Effort: Pulmonary effort is normal.     Breath sounds: Normal breath sounds.  Abdominal:     General: Abdomen is flat. Bowel sounds are normal.     Palpations: Abdomen is soft.     Hernia: A hernia is present.  Musculoskeletal:     Cervical back: Normal range of motion and neck supple.     Right lower leg: No edema.     Left lower leg: No edema.  Skin:    General: Skin is warm and dry.  Neurological:     General: No focal deficit present.     Mental Status: She is alert and oriented to person, place, and time.  Psychiatric:        Mood and Affect: Mood normal.        Behavior: Behavior normal.        Assessment/plan: 1. Palpitations Echo just done with no  significant findings. Last monitor in 2016 and normal. Thyroid just checked and wnl. No chest pain. Will check 14 day monitor. Precautions given.  - Cardiac event monitor; Future  2. Chronic fatigue Labs just done and essentially at baseline. Baseline CKD, but no other significant findings. a1c tightly controlled. On beta blocker and will try decreasing this to 12.5mg  bid. Also stopping her lasix to see if she can get some more sleep. Want close f/u on her labs/blood pressure.   3. Frequent urination Stopping lasix. I believe this was added for lower extremity edema and  I do not feel like her renal disease is significant to cause swelling. Likely secondary to venous insufficiency. We are going to stop her lasix and potassium to see if this stops her urination and helps her sleep. Close f/u on labs/potassium and bp. Discussed she may not like if her legs swell but recommend she start her compression hose.     This visit occurred during the SARS-CoV-2 public health emergency.  Safety protocols were in place, including screening questions prior to the visit, additional usage of staff PPE, and extensive cleaning of exam room while observing appropriate contact time as indicated for disinfecting solutions.   Want her to come back in 2 weeks, but she would rather do 4 weeks.   Return in about 4 weeks (around 01/04/2020) for labs/bp and leg swelling check after stopping lasix .   Orma Flaming, MD [place]   12/07/2019

## 2019-12-14 ENCOUNTER — Other Ambulatory Visit: Payer: Medicare HMO

## 2019-12-15 ENCOUNTER — Telehealth: Payer: Self-pay | Admitting: Family Medicine

## 2019-12-15 NOTE — Telephone Encounter (Signed)
Patient called in saying she needed to cancel her 01/11/20 appointment due to having another appointment the same day. But the next available is not until 03/02/20, Vanessa Gibson would like to know if it would be okay to wait until then or if she could be fit in sooner.

## 2019-12-15 NOTE — Telephone Encounter (Signed)
Needs to be fit in as I adjusted and changed her medication.  Orma Flaming, MD [place]

## 2019-12-15 NOTE — Telephone Encounter (Signed)
Please see Dr. Shelby Mattocks message and schedule pt.

## 2019-12-16 ENCOUNTER — Ambulatory Visit: Payer: Medicare HMO | Admitting: Endocrinology

## 2019-12-16 NOTE — Telephone Encounter (Signed)
Called pt to schedule appt. Pt is going to call back when she knows her availability.

## 2019-12-21 ENCOUNTER — Telehealth: Payer: Self-pay | Admitting: *Deleted

## 2019-12-21 ENCOUNTER — Other Ambulatory Visit: Payer: Self-pay | Admitting: Family Medicine

## 2019-12-21 DIAGNOSIS — G4700 Insomnia, unspecified: Secondary | ICD-10-CM

## 2019-12-21 NOTE — Telephone Encounter (Signed)
Patient enrolled for [employer] to ship a 30 day cardiac event monitor to her home.  Instructions will be included in the monitor kit. 

## 2019-12-22 NOTE — Telephone Encounter (Signed)
Last OV 12/07/19 Last refill 10/12/19 #30/0 Next OV 01/11/20

## 2019-12-27 ENCOUNTER — Telehealth: Payer: Self-pay | Admitting: Family Medicine

## 2019-12-27 NOTE — Telephone Encounter (Signed)
Patient called in saying that she received a heart monitor but she said that she did not consent to this and wanted to know what to do with it. She said she was taken off 3-4 different pills and says she is feeling better and sleeping better. Joise says that she is really wanting some ear drops because her ears have been bothering her and that she was told from her pharmacy that [facility] refused that medication.

## 2019-12-28 NOTE — Telephone Encounter (Signed)
Let her know we ordered the heart monitor because she was concerned with heart palpitations. If she does not want to do she can call whoever sent this to her and see how to return. I do think it's a good idea to make sure no afib or other arhythmia.  As for her ear, I have not gotten any px requests regarding anything for her. I normally do not send stuff in without seeing the ear as many times it's not an infection and does not require any antibiotics.   She is due for follow up for blood pressure after I adjusted her medication so we can look at ear at that time if she would like.  Orma Flaming, MD [place]

## 2019-12-29 ENCOUNTER — Telehealth: Payer: Self-pay

## 2019-12-29 NOTE — Telephone Encounter (Signed)
2nd or 3rd times the patient has called. Patient states that she have an appt on the 12th of feb at 11:20 with Dr. Rogers Blocker. Informed the patient that we have her on the schedule for 17th of feb at 1:20pm. Pt is demanding for her appt to be scheduled for the 12th of feb at 11:20am. Patient would like for someone to call her back

## 2019-12-30 ENCOUNTER — Other Ambulatory Visit: Payer: Self-pay | Admitting: Family Medicine

## 2019-12-30 NOTE — Telephone Encounter (Signed)
Called patient l/m to call office we do not have any open or show record that she had appointment on that day.

## 2019-12-30 NOTE — Telephone Encounter (Signed)
Spoke with patient on Wednesday to give message below. Pt says she doesn't know who sent the heart monitor, but she does not want it. Pt says she doesn't need anything else to deal with. I suggested that she come in for a visit to follow up on heart concerns.

## 2019-12-30 NOTE — Telephone Encounter (Signed)
I have returned patient call. No answer have l/m to call back.   I have closed another message where patient had called today and our office called back at 2:24pm and did not get answer as well. We left message to call back at that time as well.

## 2019-12-30 NOTE — Telephone Encounter (Signed)
Added to other open message

## 2019-12-30 NOTE — Telephone Encounter (Signed)
Patient called back and was saying that she was told that someone was supposed to return her phone call in regards to the heart monitor, but has yet to receive the call. As for the follow up appointment to discuss her heart, she is saying that she has an appointment on the 17th and she is unable to make that appointment because she already has an eye doctor appointment for same time and was asking if she could come in earlier than that date because of how bad she has been hurting - experiencing some extreme sleepiness,feels like she losing her strength/voice, ear pain-. For the ear pain, she said she took a fall when she was under Dr.Wallace's care and since that fall she has had terrible ear aches that turn in to headaches, the only thing that would help was the ear drops Dr.Wallace prescribed. Would like a phone call in return.Marland Kitchen

## 2020-01-02 ENCOUNTER — Telehealth: Payer: Self-pay | Admitting: Family Medicine

## 2020-01-02 ENCOUNTER — Other Ambulatory Visit: Payer: Self-pay

## 2020-01-02 DIAGNOSIS — H9203 Otalgia, bilateral: Secondary | ICD-10-CM

## 2020-01-02 NOTE — Telephone Encounter (Signed)
She will need to call cardiology as to how to use the monitor.  Orma Flaming, MD [place]

## 2020-01-02 NOTE — Telephone Encounter (Signed)
Patient is calling in again saying that she has an appointment on the 17th but can not make it to this because she has to have shots in her eyes the same day, and wanted either a new PCP within our office or for a sooner appointment. Patient also is stating that for her heart monitor she does not know what to do with it or how to put it on. Patient would like a returned call as possible. She would also like refill on the ear drops that Juleen China prescribed awhile ago.

## 2020-01-02 NOTE — Telephone Encounter (Signed)
Patient states if she does not answer to leave a detailed message.

## 2020-01-02 NOTE — Telephone Encounter (Signed)
Please review

## 2020-01-02 NOTE — Telephone Encounter (Signed)
Left a message for patient to call back to get scheduled.

## 2020-01-02 NOTE — Telephone Encounter (Signed)
Please schedule next available, for her to come in to discuss heart concerns and medication refill. Pt is requesting Ofloxacin 0.3 OTIC solution, 5 drops in both ears.

## 2020-01-03 NOTE — Telephone Encounter (Signed)
Ok to fill for patient think this is the drops she has ben requesting.

## 2020-01-11 ENCOUNTER — Ambulatory Visit: Payer: Medicare HMO | Admitting: Family Medicine

## 2020-01-11 DIAGNOSIS — H353122 Nonexudative age-related macular degeneration, left eye, intermediate dry stage: Secondary | ICD-10-CM | POA: Diagnosis not present

## 2020-01-11 DIAGNOSIS — H35721 Serous detachment of retinal pigment epithelium, right eye: Secondary | ICD-10-CM | POA: Diagnosis not present

## 2020-01-11 DIAGNOSIS — H353231 Exudative age-related macular degeneration, bilateral, with active choroidal neovascularization: Secondary | ICD-10-CM | POA: Diagnosis not present

## 2020-01-12 ENCOUNTER — Ambulatory Visit: Payer: Medicare HMO | Admitting: Family Medicine

## 2020-01-13 ENCOUNTER — Other Ambulatory Visit: Payer: Self-pay | Admitting: Pharmacy Technician

## 2020-01-13 NOTE — Patient Outreach (Signed)
[facility]) Care Management  01/13/2020  Vanessa Gibson [birth year] [id number]    Return call placed to patient's son in law, Sharee Pimple regarding patient assistance re-enrollment thru [employer] for [employer], HIPAA identifiers verified. Sharee Pimple states that they are wanting to re-enrollment for [employer]. Informed him that I would send message to [facility] to have med review done and asked who should be contacted if there are any questions about patients meds. He states that his brother in law [name] keeps up with patient's medication list. Informed him that patient's most up to date income documents would be required for program, he states that he has those documents. He requests that patient portion of application be mailed to him so that he can take to patient for completion.   -Emailed blank application to [email or web address]  -Sent med review request via  in-basket message to [place]. [name] [place] Certified Pharmacy Technician [place] Management Direct [phone]

## 2020-01-16 ENCOUNTER — Encounter: Payer: Self-pay | Admitting: Family Medicine

## 2020-01-16 ENCOUNTER — Other Ambulatory Visit: Payer: Self-pay

## 2020-01-16 ENCOUNTER — Ambulatory Visit (INDEPENDENT_AMBULATORY_CARE_PROVIDER_SITE_OTHER): Payer: Medicare HMO | Admitting: Family Medicine

## 2020-01-16 ENCOUNTER — Ambulatory Visit: Payer: Medicare HMO | Admitting: Family Medicine

## 2020-01-16 ENCOUNTER — Other Ambulatory Visit: Payer: Self-pay | Admitting: Pharmacist

## 2020-01-16 VITALS — BP 144/72 | HR 76 | Temp 98.3°F | Resp 16 | Ht 59.0 in | Wt 122.8 lb

## 2020-01-16 DIAGNOSIS — R0600 Dyspnea, unspecified: Secondary | ICD-10-CM

## 2020-01-16 DIAGNOSIS — J8489 Other specified interstitial pulmonary diseases: Secondary | ICD-10-CM

## 2020-01-16 DIAGNOSIS — R0609 Other forms of dyspnea: Secondary | ICD-10-CM

## 2020-01-16 MED ORDER — AMLODIPINE BESYLATE 5 MG PO TABS: 5.0000 mg | ORAL_TABLET | Freq: Every day | ORAL | 0 refills | Status: DC

## 2020-01-16 NOTE — Patient Outreach (Addendum)
[facility]) Care Management  [place] 01/16/2020  Wakely Gehrke [birth year] [id number]  Lares assisted patient with applying for [employer] patient assistance program for [name] and Novolog in 2020.  Family is requesting assistance again in 2021.    Per review of o/v with endocrinologist on 12/06/2019 with Dr. Dwyane Dee, patient remains on [name] and Novolog.  No other Tier 3 or above medications noted on current list.    Plan: I will route patient assistance letter to [place] technician who will coordinate patient assistance program application process for medications listed above.  [facility] pharmacy technician will assist with obtaining all required documents from both patient and provider(s) and submit application(s) once completed.    Ralene Bathe, PharmD, [place] [phone]

## 2020-01-16 NOTE — Progress Notes (Signed)
Office Note 01/16/2020  CC:  Chief Complaint  Patient presents with  . Establish Care    Previous PCP, Dr.Wolfe w/ [facility]    HPI:  Vanessa Gibson is a 86 y.o. White female who is here to establish care Patient's most recent primary MD: Dr. Kris Mouton with  (a few times.  Dr. Juleen China prior). Old records in EPIC/HL EMR were reviewed prior to or during today's visit.  She says she feels like she has many problems and says she hasn't received sufficient attention for them.  Some trouble with chronic fatigue, breathing feels short easily with walking.  She waits short time and feels better, but pt not good historian so can't be sure of some specifics.  "A touch" of CP sub-sternally but pt not very clear.  No chest heaviness or pressure.  No wheezing or coughing.  Feels unsteady when walking (chronic).  Looks like some imaging has been done over the last couple years for this same complaint (see labs/imaging section below". Chronic numbness in hands, also painful fingers.  No symptoms in feet. No color changes.  Reports compliance with her insulins and other chronic meds. No home bp monitoring being done.  Chronic pancreatitis in problem list--???  Past Medical History:  Diagnosis Date  . Abnormal EKG    RBBB and LAFB  . Anxiety and depression   . Arthritis   . Asthma   . Breast cancer ([place]), bilateral   . Chronic pancreatitis ([place])   . Chronic renal insufficiency, stage 3 (moderate)    GFR @40   . Diabetes mellitus ([place])   . Dizziness   . GERD (gastroesophageal reflux disease)   . Hard of hearing   . History of hiatal hernia   . Hyperlipemia   . Hypertension   . IBS (irritable bowel syndrome)   . Mandibular fracture ([place]) 12/04/2015  . MI (myocardial infarction) ([place])   . Skin cancer of face     Past Surgical History:  Procedure Laterality Date  . BREAST SURGERY    . CATARACT EXTRACTION W/ INTRAOCULAR LENS  IMPLANT, BILATERAL Bilateral   . CHOLECYSTECTOMY OPEN   1970's  . MASTECTOMY Bilateral     History reviewed. No pertinent family history.  Social History   Socioeconomic History  . Marital status: Divorced    Spouse name: Not on file  . Number of children: Not on file  . Years of education: Not on file  . Highest education level: Not on file  Occupational History  . Not on file  Tobacco Use  . Smoking status: Never Smoker  . Smokeless tobacco: Never Used  Substance and Sexual Activity  . Alcohol use: No  . Drug use: No  . Sexual activity: Never  Other Topics Concern  . Not on file  Social History Narrative   Widow, lives alone.  Has 3 kids.    Moved to [place] from [place] in 2016 to be near her daughter.  Orig from [place], has been in [place] since late teens.   Homemaker.   Never smoker.  No alcohol.   Lives at [facility], [place].    Social Determinants of Health   Financial Resource Strain:   . Difficulty of Paying Living Expenses: Not on file  Food Insecurity:   . Worried About Charity fundraiser in the Last Year: Not on file  . Ran Out of Food in the Last Year: Not on file  Transportation Needs:   . Lack of Transportation (Medical): Not  on file  . Lack of Transportation (Non-Medical): Not on file  Physical Activity:   . Days of Exercise per Week: Not on file  . Minutes of Exercise per Session: Not on file  Stress:   . Feeling of Stress : Not on file  Social Connections:   . Frequency of Communication with Friends and Family: Not on file  . Frequency of Social Gatherings with Friends and Family: Not on file  . Attends Religious Services: Not on file  . Active Member of Clubs or Organizations: Not on file  . Attends Archivist Meetings: Not on file  . Marital Status: Not on file  Intimate Partner Violence:   . Fear of Current or Ex-Partner: Not on file  . Emotionally Abused: Not on file  . Physically Abused: Not on file  . Sexually Abused: Not on file    Outpatient Encounter Medications as of  01/16/2020  Medication Sig  . ACCU-CHEK GUIDE test strip CHECK BLOOD SUGAR THREE TO FOUR TIMES DAILY  . acetaminophen (TYLENOL) 325 MG tablet Take 2 tablets (650 mg total) by mouth every 6 (six) hours as needed for moderate pain or headache.  . Alcohol Swabs (B-D SINGLE USE SWABS REGULAR) PADS USE TO CLEAN FINGER BEFORE CHECKING BLOOD SUGARS AND CLEAN SKIN BEFORE INSULIN INJECTIONS.  Marland Kitchen aspirin EC 81 MG tablet Take 81 mg by mouth daily.  Marland Kitchen azelastine (ASTELIN) 0.1 % nasal spray Place 1 spray into both nostrils 2 (two) times daily. Use in each nostril as directed  . BD PEN NEEDLE NANO U/F 32G X 4 MM MISC USE TO INJECT INSULINS 4 TIMES DAILY.  . citalopram (CELEXA) 10 MG tablet Take 1 tablet (10 mg total) by mouth daily.  . diclofenac Sodium (VOLTAREN) 1 % GEL Apply 4 g topically 4 (four) times daily.  . fluticasone (FLONASE) 50 MCG/ACT nasal spray Place 2 sprays into both nostrils daily.  . Insulin Aspart FlexPen 100 UNIT/ML SOPN Inject 3-4 Units into the skin 2 (two) times a day. (Patient taking differently: Inject 6-8 Units into the skin 2 (two) times a day. Inject 6 units in the morning before breakfast and 8 units before dinner.)  . levocetirizine (XYZAL) 5 MG tablet Take 0.5 tablets (2.5 mg total) by mouth every evening.  Marland Kitchen LORazepam (ATIVAN) 0.5 MG tablet TAKE 1 TABLET AT BEDTIME  . lovastatin (MEVACOR) 20 MG tablet Take 1 tablet (20 mg total) by mouth at bedtime.  . metoprolol tartrate (LOPRESSOR) 25 MG tablet Take 0.5 tablets (12.5 mg total) by mouth 2 (two) times daily. TAKE 1 TABLET BY MOUTH TWICE DAILY.  . montelukast (SINGULAIR) 10 MG tablet Take 1 tablet (10 mg total) by mouth at bedtime.  . Multiple Vitamins-Minerals (CENTRUM SILVER 50+WOMEN) TABS Take 1 tablet by mouth daily.  Marland Kitchen ofloxacin (FLOXIN) 0.3 % OTIC solution INSTILL 5 DROPS IN EACH EAR ONCE A DAY.  Marland Kitchen Olopatadine HCl (PATADAY) 0.2 % SOLN Apply 1 drop to each eye once daily.  Marland Kitchen omeprazole (PRILOSEC) 40 MG capsule TAKE 1 CAPSULE  EVERY DAY  . TRESIBA FLEXTOUCH 100 UNIT/ML SOPN FlexTouch Pen INJECT 20 UNITS SUBCUTANEOUSLY ONE TIME DAILY  . TRUEplus Lancets 28G MISC CHECK BLOOD SUGAR 3 TO 4 TIMES DAILY.  Marland Kitchen amLODipine (NORVASC) 5 MG tablet Take 1 tablet (5 mg total) by mouth daily.  . [DISCONTINUED] Ascorbic Acid (VITAMIN C) 1000 MG tablet Take 1,000 mg by mouth daily.  . [DISCONTINUED] Multiple Vitamins-Minerals (HAIR/SKIN/NAILS/BIOTIN PO) Take 1 tablet by mouth daily.  . [  DISCONTINUED] multivitamin-lutein (OCUVITE-LUTEIN) CAPS capsule Take 1 capsule by mouth daily.   No facility-administered encounter medications on file as of 01/16/2020.    No Known Allergies  ROS Review of Systems  Constitutional: Positive for fatigue. Negative for fever.  HENT: Negative for congestion and sore throat.   Eyes: Negative for visual disturbance.  Respiratory: Positive for shortness of breath. Negative for cough.   Cardiovascular: Negative for chest pain and palpitations.  Gastrointestinal: Negative for abdominal pain and nausea.  Endocrine: Negative for polydipsia, polyphagia and polyuria.  Genitourinary: Negative for dysuria.  Musculoskeletal: Negative for back pain and joint swelling.  Skin: Negative for rash.  Neurological: Negative for weakness and headaches.  Hematological: Negative for adenopathy.  Psychiatric/Behavioral: The patient is nervous/anxious.     PE; Blood pressure (!) 144/72, pulse 76, temperature 98.3 F (36.8 C), temperature source Temporal, resp. rate 16, height 4\' 11"  (1.499 m), weight 122 lb 12.8 oz (55.7 kg), SpO2 96 %. Body mass index is 24.8 kg/m.  Gen: Alert, well appearing.  Patient is oriented to person, place, time, and situation.  Moderately hard of hearing. AFFECT: pleasant, lucid thought and speech. [id number]: no injection, icteris, swelling, or exudate.  EOMI, PERRLA. Mouth: lips without lesion/swelling.  Oral mucosa pink and moist. Oropharynx without erythema, exudate, or swelling.  CV:  RRR, no m/r/g.   LUNGS: CTA bilat, nonlabored resps, good aeration in all lung fields. ABD: soft, NT/ND EXT: no clubbing or cyanosis.  No pitting edema.    Pertinent labs/imaging:  Lab Results  Component Value Date   TSH 1.56 10/10/2019   Lab Results  Component Value Date   WBC 10.6 (H) 10/10/2019   HGB 12.1 10/10/2019   HCT 35.7 (L) 10/10/2019   MCV 95.3 10/10/2019   PLT 272.0 10/10/2019   Lab Results  Component Value Date   CREATININE 1.31 (H) 10/10/2019   BUN 28 (H) 10/10/2019   NA 138 10/10/2019   K 4.4 10/10/2019   CL 101 10/10/2019   CO2 28 10/10/2019   Lab Results  Component Value Date   ALT 16 10/10/2019   AST 23 10/10/2019   ALKPHOS 58 10/10/2019   BILITOT 0.4 10/10/2019   Lab Results  Component Value Date   CHOL 158 08/24/2019   Lab Results  Component Value Date   HDL 50.50 08/24/2019   Lab Results  Component Value Date   LDLCALC 77 08/24/2019   Lab Results  Component Value Date   TRIG 153.0 (H) 08/24/2019   Lab Results  Component Value Date   CHOLHDL 3 08/24/2019   Lab Results  Component Value Date   HGBA1C 6.6 (A) 12/06/2019   CT chest high resolution 03/30/18 IMPRESSION: 1. Minimal patchy subpleural reticulation and ground-glass attenuation in the lower lobes. These findings are nonspecific and could be due to hypoventilatory change or nonspecific scarring, with the possibility of a mild interstitial lung disease such as nonspecific interstitial pneumonia (NSIP) or early usual interstitial pneumonia (UIP) not entirely excluded. A follow-up high-resolution chest CT study may be obtained in 12 months to assess temporal pattern stability, as clinically warranted. 2. Mild patchy air trapping in both lungs, indicative of small airways disease. 3. Small hiatal hernia. 4. Left main and 3 vessel coronary atherosclerosis.  ASSESSMENT AND PLAN:   1) DOE; chronic, unknown cause. Suspect she does have deconditioning, but I'll also check CT  chest to f/u the interstitial changes noted on imaging 03/2018.  No new meds for this today.  Echo with grd  I DD, normal systolic function [id number].  No stress testing has been done, so if pulm w/u unrevealing for good explanation of her DOE will ask cardiologist to see her. Consider PFTs +/- trial of daily LABA/LAMA.  2) HTN: not ideal control. Start amlodipine 5mg , monitor bp at [facility] twice per week. Continue lopressor 12.5mg  bid.  Given her DOE I'll try to avoid increasing this med.  3) DM 2; insulin-requiring.  She is followed by Dr. Dwyane Dee in endocrinology, most recent o/v 12/06/19. A1c 6.6% about 6 wks ago.  No changes at this time.  4) CAD, hx of MI: as per #1, pt may need further cardiac eval. Continue ASA, BB, statin.  5) CRI III: avoid NSAIDs/potential nephrotoxins. BMET today.  An After Visit Summary was printed and given to the patient.  Return in about 2 weeks (around 01/30/2020) for f/u doe + others (30 min in person).  Signed:  Crissie Sickles, MD           01/16/2020

## 2020-01-17 LAB — BASIC METABOLIC PANEL
BUN: 29 mg/dL — ABNORMAL HIGH (ref 6–23)
CO2: 28 mEq/L (ref 19–32)
Calcium: 9.5 mg/dL (ref 8.4–10.5)
Chloride: 100 mEq/L (ref 96–112)
Creatinine, Ser: 1.34 mg/dL — ABNORMAL HIGH (ref 0.40–1.20)
GFR: 37.22 mL/min — ABNORMAL LOW (ref >60.00)
Glucose, Bld: 137 mg/dL — ABNORMAL HIGH (ref 70–99)
Potassium: 4.8 mEq/L (ref 3.5–5.1)
Sodium: 133 mEq/L — ABNORMAL LOW (ref 135–145)

## 2020-01-17 LAB — CBC WITH DIFFERENTIAL/PLATELET
Basophils Absolute: 0.1 10*3/uL (ref 0.0–0.1)
Basophils Relative: 0.8 % (ref 0.0–3.0)
Eosinophils Absolute: 0.2 10*3/uL (ref 0.0–0.7)
Eosinophils Relative: 2.4 % (ref 0.0–5.0)
HCT: 34.5 % — ABNORMAL LOW (ref 36.0–46.0)
Hemoglobin: 11.8 g/dL — ABNORMAL LOW (ref 12.0–15.0)
Lymphocytes Relative: 25.2 % (ref 12.0–46.0)
Lymphs Abs: 2.2 10*3/uL (ref 0.7–4.0)
MCHC: 34.1 g/dL (ref 30.0–36.0)
MCV: 94.8 fl (ref 78.0–100.0)
Monocytes Absolute: 0.9 10*3/uL (ref 0.1–1.0)
Monocytes Relative: 10.7 % (ref 3.0–12.0)
Neutro Abs: 5.2 10*3/uL (ref 1.4–7.7)
Neutrophils Relative %: 60.9 % (ref 43.0–77.0)
Platelets: 282 10*3/uL (ref 150.0–400.0)
RBC: 3.64 Mil/uL — ABNORMAL LOW (ref 3.87–5.11)
RDW: 13.4 % (ref 11.5–15.5)
WBC: 8.6 10*3/uL (ref 4.0–10.5)

## 2020-01-17 LAB — SEDIMENTATION RATE: Sed Rate: 14 mm/hr (ref 0–30)

## 2020-01-23 ENCOUNTER — Other Ambulatory Visit (HOSPITAL_BASED_OUTPATIENT_CLINIC_OR_DEPARTMENT_OTHER): Payer: Medicare HMO

## 2020-01-23 DIAGNOSIS — R590 Localized enlarged lymph nodes: Secondary | ICD-10-CM

## 2020-01-23 HISTORY — DX: Localized enlarged lymph nodes: R59.0

## 2020-01-25 ENCOUNTER — Ambulatory Visit (HOSPITAL_BASED_OUTPATIENT_CLINIC_OR_DEPARTMENT_OTHER)
Admission: RE | Admit: 2020-01-25 | Discharge: 2020-01-25 | Disposition: A | Discharge status: Home / Self Care | Payer: Medicare HMO | Source: Ambulatory Visit | Attending: Family Medicine | Admitting: Family Medicine

## 2020-01-25 ENCOUNTER — Other Ambulatory Visit: Payer: Self-pay

## 2020-01-25 DIAGNOSIS — R0600 Dyspnea, unspecified: Secondary | ICD-10-CM | POA: Insufficient documentation

## 2020-01-25 DIAGNOSIS — J8489 Other specified interstitial pulmonary diseases: Secondary | ICD-10-CM | POA: Insufficient documentation

## 2020-01-25 DIAGNOSIS — J9811 Atelectasis: Secondary | ICD-10-CM | POA: Diagnosis not present

## 2020-01-25 DIAGNOSIS — R0609 Other forms of dyspnea: Secondary | ICD-10-CM

## 2020-01-26 ENCOUNTER — Telehealth: Payer: Self-pay

## 2020-01-26 ENCOUNTER — Other Ambulatory Visit: Payer: Self-pay | Admitting: Family Medicine

## 2020-01-26 DIAGNOSIS — K219 Gastro-esophageal reflux disease without esophagitis: Secondary | ICD-10-CM

## 2020-01-26 NOTE — Telephone Encounter (Signed)
Patient states she was returning Dr. Idelle Leech call.  Please call patient before 1:30PM today as she has an appt and will not be available to talk thereafter.  Thank you.

## 2020-01-26 NOTE — Telephone Encounter (Signed)
Patient returning your call to discuss CT results. Please try calling before 1:30pm today

## 2020-01-26 NOTE — Telephone Encounter (Signed)
Patient will home now but will be leaving in 20 minutes (3:50). She lives in group setting so does not have control over her schedule. She will be back home after 5:30.

## 2020-01-27 NOTE — Telephone Encounter (Signed)
Please return pt's call regarding recent CT results

## 2020-01-30 NOTE — Telephone Encounter (Signed)
Contacted pt. VERY hard of hearing despite my yelling into the phone. She seemed to understand that her lungs looked fine but she has some right axillary adenopathy. I tried to tell her the findings were nonspecific and that she needs diagnostic ultrasound [facility] of the [facility] since she has history of breast cancer.  She said she would try to get her daughter to come in with her for her next appt in 2d, otherwise I'll have to call her family in [place] to try to communicate info to them. Signed:  Crissie Sickles, MD           01/30/2020

## 2020-02-01 ENCOUNTER — Encounter: Payer: Self-pay | Admitting: Family Medicine

## 2020-02-01 ENCOUNTER — Other Ambulatory Visit: Payer: Self-pay

## 2020-02-01 ENCOUNTER — Ambulatory Visit (INDEPENDENT_AMBULATORY_CARE_PROVIDER_SITE_OTHER): Payer: Medicare HMO | Admitting: Family Medicine

## 2020-02-01 ENCOUNTER — Telehealth: Payer: Self-pay

## 2020-02-01 VITALS — BP 130/71 | HR 79 | Temp 98.2°F | Resp 16 | Ht 59.0 in | Wt 127.0 lb

## 2020-02-01 DIAGNOSIS — R590 Localized enlarged lymph nodes: Secondary | ICD-10-CM

## 2020-02-01 MED ORDER — AMLODIPINE BESYLATE 5 MG PO TABS: 5.0000 mg | ORAL_TABLET | Freq: Every day | ORAL | 3 refills | Status: DC

## 2020-02-01 MED ORDER — ANORO ELLIPTA 62.5-25 MCG/INH IN AEPB: 1.0000 | INHALATION_SPRAY | Freq: Every day | RESPIRATORY_TRACT | 1 refills | Status: DC

## 2020-02-01 NOTE — Telephone Encounter (Signed)
PA done on 02/01/20 for Anoro-ellipta inhaler thru [facility], REF # [id number] Dx. DOE, R06.00. Member ID# [id number]. CB # [phone] and will be notified via fax within 24-72 hours with approval/denial.  Waiting for response.

## 2020-02-01 NOTE — Progress Notes (Signed)
OFFICE VISIT  02/01/2020   CC:  Chief Complaint  Patient presents with  . Follow-up    DOE, other medical issues     HPI:    Patient is a 86 y.o. Caucasian female who presents accompanied by her daughter [name] for 2 wk f/u HTN, chronic DOE, hx of abnl CT chest.  A/P as of last visit: "1) DOE; chronic, unknown cause. Suspect she does have deconditioning, but I'll also check CT chest to f/u the interstitial changes noted on imaging 03/2018.  No new meds for this today.  Echo with grd I DD, normal systolic function [id number].  No stress testing has been done, so if pulm w/u unrevealing for good explanation of her DOE will ask cardiologist to see her. Consider PFTs +/- trial of daily LABA/LAMA.  2) HTN: not ideal control. Start amlodipine 5mg , monitor bp at [facility] twice per week. Continue lopressor 12.5mg  bid.  Given her DOE I'll try to avoid increasing this med.  3) DM 2; insulin-requiring.  She is followed by Dr. Dwyane Dee in endocrinology, most recent o/v 12/06/19. A1c 6.6% about 6 wks ago.  No changes at this time.  4) CAD, hx of MI: as per #1, pt may need further cardiac eval. Continue ASA, BB, statin.  5) CRI III: avoid NSAIDs/potential nephrotoxins. BMET today."  Interim hx: All labs were stable at last visit. CT scan 01/25/2020: CT CHEST WITHOUT CONTRAST  TECHNIQUE: Multidetector CT imaging of the chest was performed following the standard protocol without intravenous contrast. High resolution imaging of the lungs, as well as inspiratory and expiratory imaging, was performed.  COMPARISON:  03/30/2018 high-resolution chest CT.  FINDINGS: Cardiovascular: Top-normal heart size. No significant pericardial effusion/thickening. Three-vessel coronary atherosclerosis. Atherosclerotic nonaneurysmal thoracic aorta. Normal caliber pulmonary arteries.  Mediastinum/Nodes: No discrete thyroid nodules. Unremarkable esophagus. Newly mildly enlarged 1.2 cm right  axillary lymph node (series 2/image 25). No left axillary adenopathy. No pathologically enlarged mediastinal or discrete hilar nodes on this noncontrast scan.  Lungs/Pleura: No pneumothorax. No pleural effusion. No acute consolidative airspace disease, lung masses or significant pulmonary nodules. Mild compressive atelectasis in the medial right lower lobe due to the hiatal hernia. No significant regions of subpleural reticulation, ground-glass attenuation, traction bronchiectasis, architectural distortion or frank honeycombing. Previously described minimal patchy subpleural reticulation and ground-glass attenuation in the lower lungs is less apparent on today's scan and was likely largely due to hypoventilatory change on the prior scan. Mild patchy air trapping in both lungs on the expiration sequence is unchanged, with no evidence of tracheobronchomalacia.  Upper abdomen: Moderate hiatal hernia.  Musculoskeletal: No aggressive appearing focal osseous lesions. Moderate thoracic spondylosis.  IMPRESSION: 1. New mild right axillary adenopathy, nonspecific. Given the history of breast cancer as reported on prior imaging studies, recommend evaluation at a women's imaging center with diagnostic right axillary ultrasound. 2. Mild patchy air trapping in both lungs, indicative of small airways disease, unchanged. 3. No evidence of interstitial lung disease. 4. Moderate hiatal hernia. 5. Three-vessel coronary atherosclerosis. 6. Aortic Atherosclerosis (ICD10-I70.0).   PMP AWARE reviewed today: most recent rx for lorazepam 0.5mg  was filled 12/23/19, # 30, rx by Dr. Kris Mouton. No red flags.   BP: no bp checks at [facility]: "They offer to check it in the mornings and I'm too lazy that time of day". BP here today is good.  No side effects from amlodipine,  Abd hernia many years, no hurting. NO abd pain.  NO n/v, no GERD.  Bowels move fine.  No tarry stools.  No  BRBPR. Takes pepto bismol daily and this causes some black discoloration of stools---takes this for bloating.  Has chronic DOE with sounds 20 paces or so, also when bending over.  Says it takes a few minutes to feel back to normal. No wheezing.  Has nonproductive cough.  She feels no lumps/massess in axillae or on chest wall or elsewhere.  Past Medical History:  Diagnosis Date  . Abnormal EKG    RBBB and LAFB  . Anxiety and depression   . Arthritis   . Asthma   . Breast cancer [facility]), bilateral approx age 44   bilat mastectomy then tamoxifen x 10 yrs  . Chronic renal insufficiency, stage 3 (moderate)    GFR @40   . Diabetes mellitus ([place])   . Dizziness   . GERD (gastroesophageal reflux disease)   . Hard of hearing   . History of hiatal hernia   . Hyperlipemia   . Hypertension   . IBS (irritable bowel syndrome)   . Mandibular fracture ([place]) 12/04/2015  . MI (myocardial infarction) ([place])   . Skin cancer of face     Past Surgical History:  Procedure Laterality Date  . BREAST SURGERY    . Cardiac event monitor  2016   Normal  . CATARACT EXTRACTION W/ INTRAOCULAR LENS  IMPLANT, BILATERAL Bilateral   . CHOLECYSTECTOMY OPEN  1970's  . MASTECTOMY Bilateral   . PFTs  04/05/2018   Interpretation: The FVC, FEV1, FEV1/FVC ratio and FEF25-75% are within normal limits. Patient effort was erratic, but mild dec in diff capacity noted--mild dec functional alveolar tissue  . TRANSTHORACIC ECHOCARDIOGRAM  11/09/2019   EF 60-65%, grd I DD.    Outpatient Medications Prior to Visit  Medication Sig Dispense Refill  . ACCU-CHEK GUIDE test strip CHECK BLOOD SUGAR THREE TO FOUR TIMES DAILY 400 strip 2  . acetaminophen (TYLENOL) 325 MG tablet Take 2 tablets (650 mg total) by mouth every 6 (six) hours as needed for moderate pain or headache.    . Alcohol Swabs (B-D SINGLE USE SWABS REGULAR) PADS USE TO CLEAN FINGER BEFORE CHECKING BLOOD SUGARS AND CLEAN SKIN BEFORE INSULIN INJECTIONS. 300 each 3   . aspirin EC 81 MG tablet Take 81 mg by mouth daily.    Marland Kitchen azelastine (ASTELIN) 0.1 % nasal spray Place 1 spray into both nostrils 2 (two) times daily. Use in each nostril as directed 30 mL 12  . BD PEN NEEDLE NANO U/F 32G X 4 MM MISC USE TO INJECT INSULINS 4 TIMES DAILY. 200 each 11  . citalopram (CELEXA) 10 MG tablet Take 1 tablet (10 mg total) by mouth daily. 90 tablet 2  . diclofenac Sodium (VOLTAREN) 1 % GEL Apply 4 g topically 4 (four) times daily. 100 g 0  . fluticasone (FLONASE) 50 MCG/ACT nasal spray Place 2 sprays into both nostrils daily. 16 g 0  . Insulin Aspart FlexPen 100 UNIT/ML SOPN Inject 3-4 Units into the skin 2 (two) times a day. (Patient taking differently: Inject 6-8 Units into the skin 2 (two) times a day. Inject 6 units in the morning before breakfast and 8 units before dinner.) 5 pen 3  . levocetirizine (XYZAL) 5 MG tablet Take 0.5 tablets (2.5 mg total) by mouth every evening. 45 tablet 3  . LORazepam (ATIVAN) 0.5 MG tablet TAKE 1 TABLET AT BEDTIME 30 tablet 0  . lovastatin (MEVACOR) 20 MG tablet Take 1 tablet (20 mg total) by mouth at bedtime. 90 tablet 3  .  metoprolol tartrate (LOPRESSOR) 25 MG tablet Take 0.5 tablets (12.5 mg total) by mouth 2 (two) times daily. TAKE 1 TABLET BY MOUTH TWICE DAILY. 180 tablet 3  . montelukast (SINGULAIR) 10 MG tablet Take 1 tablet (10 mg total) by mouth at bedtime. 90 tablet 3  . Multiple Vitamins-Minerals (CENTRUM SILVER 50+WOMEN) TABS Take 1 tablet by mouth daily.    Marland Kitchen ofloxacin (FLOXIN) 0.3 % OTIC solution INSTILL 5 DROPS IN EACH EAR ONCE A DAY. 5 mL 0  . Olopatadine HCl (PATADAY) 0.2 % SOLN Apply 1 drop to each eye once daily. 2.5 mL 0  . omeprazole (PRILOSEC) 40 MG capsule TAKE 1 CAPSULE EVERY DAY 90 capsule 0  . TRESIBA FLEXTOUCH 100 UNIT/ML SOPN FlexTouch Pen INJECT 20 UNITS SUBCUTANEOUSLY ONE TIME DAILY 30 mL 3  . TRUEplus Lancets 28G MISC CHECK BLOOD SUGAR 3 TO 4 TIMES DAILY. 200 each 0  . amLODipine (NORVASC) 5 MG tablet Take  1 tablet (5 mg total) by mouth daily. 30 tablet 0   No facility-administered medications prior to visit.    No Known Allergies  ROS As per HPI  PE: oxygen on RA at rest 98%.  With ambulation approx 200 feet her sat stayed 96%.  P 98 at rest and with ambulation. Blood pressure 130/71, pulse 79, temperature 98.2 F (36.8 C), temperature source Temporal, resp. rate 16, height 4\' 11"  (1.499 m), weight 127 lb (57.6 kg), SpO2 96 %. Gen: Alert, well appearing.  Patient is oriented to person, place, time, and situation. AFFECT: pleasant, lucid thought and speech. CV: RRR, no m/r/g.   LUNGS: CTA bilat, nonlabored resps, good aeration in all lung fields. ABD: soft, NT, baseball sized midline ventral hernia, no mass palpable. EXT: no clubbing or cyanosis.  2-2+ pitting edema in both ankles. Axillary areas bilat w/out mass/nodule/induration or rash.  LABS:  Lab Results  Component Value Date   TSH 1.56 10/10/2019   Lab Results  Component Value Date   WBC 8.6 01/16/2020   HGB 11.8 (L) 01/16/2020   HCT 34.5 (L) 01/16/2020   MCV 94.8 01/16/2020   PLT 282.0 01/16/2020   Lab Results  Component Value Date   CREATININE 1.34 (H) 01/16/2020   BUN 29 (H) 01/16/2020   NA 133 (L) 01/16/2020   K 4.8 01/16/2020   CL 100 01/16/2020   CO2 28 01/16/2020   Lab Results  Component Value Date   ALT 16 10/10/2019   AST 23 10/10/2019   ALKPHOS 58 10/10/2019   BILITOT 0.4 10/10/2019   Lab Results  Component Value Date   CHOL 158 08/24/2019   Lab Results  Component Value Date   HDL 50.50 08/24/2019   Lab Results  Component Value Date   LDLCALC 77 08/24/2019   Lab Results  Component Value Date   TRIG 153.0 (H) 08/24/2019   Lab Results  Component Value Date   CHOLHDL 3 08/24/2019   Lab Results  Component Value Date   HGBA1C 6.6 (A) 12/06/2019    IMPRESSION AND PLAN:  1) Right axillary adenopathy of unknown significance.  Has VERY remote hx of breast ca. As per radiologist's  recommendation I have ordered a diagnostic ultrasound to further evaluate.  2) DOE: repeat of her chest CT showed no sign of interstitial lung dz. She did have some signs of small airways dz.  Oxygenation normal on RA and with ambulation around our office.  Certainly she is deconditioned and I wonder if some mild COPD could be contributing to  her sx's. Also, she has a moderate sized hiatal hernia that could contribute to her sensation of DOE (or at least the SOB when she bends over).  I'll do trial of anoro ellipta 1 p qd---instructed pt to ask her pharmacist to demonstrate technique.    3) Abd wall/ventral hernia: stable over the years per pt, asymptomatic.  Reassured. Obs.  Signs/symptoms to call or return for were reviewed and pt expressed understanding.  4) HTN: The current medical regimen is effective;  continue present plan and medications. Continue amlodipine 5mg  qd and lopressor 25mg  bid.  An After Visit Summary was printed and given to the patient.  FOLLOW UP: Return in about 4 weeks (around 02/29/2020) for routine chronic illness f/u.  Signed:  Crissie Sickles, MD           02/01/2020

## 2020-02-02 ENCOUNTER — Telehealth: Payer: Self-pay

## 2020-02-02 MED ORDER — STIOLTO RESPIMAT 2.5-2.5 MCG/ACT IN AERS: 2.0000 | INHALATION_SPRAY | Freq: Every day | RESPIRATORY_TRACT | 1 refills | Status: DC

## 2020-02-02 NOTE — Telephone Encounter (Signed)
A user error has taken place: encounter opened in error, closed for administrative reasons.

## 2020-02-02 NOTE — Telephone Encounter (Signed)
OK I just sent in rx for stiolto respimat to [facility]

## 2020-02-02 NOTE — Addendum Note (Signed)
Addended by: Tammi Sou on: 02/02/2020 12:30 PM   Modules accepted: Orders

## 2020-02-02 NOTE — Telephone Encounter (Signed)
PA request denied for Anoro-ellipta. Alternatives that are on formulary are Stiolto respimat, 30 d/s is $45 and bevespi aerosphere $95 for 30 d/s.  Please advise, thanks.

## 2020-02-03 ENCOUNTER — Other Ambulatory Visit: Payer: Self-pay | Admitting: Family Medicine

## 2020-02-03 DIAGNOSIS — R590 Localized enlarged lymph nodes: Secondary | ICD-10-CM

## 2020-02-07 ENCOUNTER — Telehealth: Payer: Self-pay

## 2020-02-07 NOTE — Telephone Encounter (Signed)
Received fax from [facility] for pt to receive PT/OT/ST for muscle weakness/self care adl/cognition- Med management  Placed on PCP desk to review and sign, if appropriate.

## 2020-02-07 NOTE — Telephone Encounter (Signed)
Signed and put in box to go up front. Signed:  Crissie Sickles, MD           02/07/2020

## 2020-02-08 ENCOUNTER — Other Ambulatory Visit: Payer: Self-pay | Admitting: Pharmacy Technician

## 2020-02-08 NOTE — Patient Outreach (Signed)
[place] [facility]) Care Management  02/08/2020  Genoveva Pu 1928-06-14 [id number]   Received patient portion(s) of patient assistance application(s) for [name] and [employer]. Faxed completed application and required documents into [employer].  Will follow up with company(ies) in 10-14 business days to check status of application(s).  [name] [name] P[place] Certified Pharmacy Technician [place] Management Direct Dial:[phone]

## 2020-02-13 ENCOUNTER — Telehealth: Payer: Self-pay

## 2020-02-13 NOTE — Telephone Encounter (Signed)
Received fax from [employer] stating that a four month supply of medication has been sent to this office and will be arriving soon.

## 2020-02-14 ENCOUNTER — Other Ambulatory Visit: Payer: Self-pay | Admitting: Family Medicine

## 2020-02-14 DIAGNOSIS — G4700 Insomnia, unspecified: Secondary | ICD-10-CM

## 2020-02-14 NOTE — Telephone Encounter (Signed)
Last refill: 1.28.21 #30, 0 Last OV: 3.10.21 dx. Axillary adenopathy

## 2020-02-15 ENCOUNTER — Other Ambulatory Visit: Payer: Self-pay

## 2020-02-16 ENCOUNTER — Other Ambulatory Visit: Payer: Self-pay | Admitting: Endocrinology

## 2020-02-22 DIAGNOSIS — H353211 Exudative age-related macular degeneration, right eye, with active choroidal neovascularization: Secondary | ICD-10-CM | POA: Diagnosis not present

## 2020-02-22 DIAGNOSIS — H353231 Exudative age-related macular degeneration, bilateral, with active choroidal neovascularization: Secondary | ICD-10-CM | POA: Diagnosis not present

## 2020-02-22 DIAGNOSIS — H353122 Nonexudative age-related macular degeneration, left eye, intermediate dry stage: Secondary | ICD-10-CM | POA: Diagnosis not present

## 2020-02-22 DIAGNOSIS — H35721 Serous detachment of retinal pigment epithelium, right eye: Secondary | ICD-10-CM | POA: Diagnosis not present

## 2020-02-22 DIAGNOSIS — E119 Type 2 diabetes mellitus without complications: Secondary | ICD-10-CM | POA: Diagnosis not present

## 2020-02-22 DIAGNOSIS — H353221 Exudative age-related macular degeneration, left eye, with active choroidal neovascularization: Secondary | ICD-10-CM | POA: Diagnosis not present

## 2020-02-23 ENCOUNTER — Telehealth: Payer: Self-pay

## 2020-02-23 ENCOUNTER — Other Ambulatory Visit: Payer: Self-pay | Admitting: Pharmacy Technician

## 2020-02-23 NOTE — Patient Outreach (Signed)
[place] [facility]) Care Management  02/23/2020  Vanessa Gibson 1927-12-15 [id number]    Received email from patients son in law Durene Fruits stating the Ms. Zappone received [name] and Novolog from [employer]. Emailed Durene Fruits to inform him on obtaining refills in the future and requested he contact me with any issues.  Will remove myself from care team  [name]. [name], [place] Certified Pharmacy Technician [employer]

## 2020-02-23 NOTE — Telephone Encounter (Signed)
Received shipment from Patient assistance foundation with medications for pt. Inside shipment was two boxes of Novolog and two boxes of [name].  Each box contains 5 insulin pens.  Called pt and gave her this information. Pt verbalized understanding. She will pick up medication soon.

## 2020-02-27 ENCOUNTER — Other Ambulatory Visit: Payer: Self-pay | Admitting: Family Medicine

## 2020-02-27 ENCOUNTER — Other Ambulatory Visit: Payer: Self-pay

## 2020-02-27 ENCOUNTER — Ambulatory Visit
Admission: RE | Admit: 2020-02-27 | Discharge: 2020-02-27 | Disposition: A | Discharge status: Home / Self Care | Payer: Medicare HMO | Source: Ambulatory Visit | Attending: Family Medicine | Admitting: Family Medicine

## 2020-02-27 DIAGNOSIS — R590 Localized enlarged lymph nodes: Secondary | ICD-10-CM | POA: Diagnosis not present

## 2020-02-29 ENCOUNTER — Other Ambulatory Visit: Payer: Self-pay

## 2020-02-29 DIAGNOSIS — Z20828 Contact with and (suspected) exposure to other viral communicable diseases: Secondary | ICD-10-CM | POA: Diagnosis not present

## 2020-02-29 DIAGNOSIS — K219 Gastro-esophageal reflux disease without esophagitis: Secondary | ICD-10-CM

## 2020-02-29 DIAGNOSIS — Z1159 Encounter for screening for other viral diseases: Secondary | ICD-10-CM | POA: Diagnosis not present

## 2020-02-29 MED ORDER — ACCU-CHEK SOFTCLIX LANCETS MISC: 2 refills | Status: DC

## 2020-02-29 MED ORDER — OMEPRAZOLE 40 MG PO CPDR: 40.0000 mg | DELAYED_RELEASE_CAPSULE | Freq: Every day | ORAL | 0 refills | Status: DC

## 2020-03-01 ENCOUNTER — Encounter: Payer: Self-pay | Admitting: Family Medicine

## 2020-03-02 ENCOUNTER — Other Ambulatory Visit: Payer: Self-pay

## 2020-03-02 NOTE — Telephone Encounter (Signed)
Cancelled.  

## 2020-03-05 ENCOUNTER — Encounter: Payer: Self-pay | Admitting: Endocrinology

## 2020-03-05 ENCOUNTER — Ambulatory Visit (INDEPENDENT_AMBULATORY_CARE_PROVIDER_SITE_OTHER): Payer: Medicare HMO | Admitting: Endocrinology

## 2020-03-05 ENCOUNTER — Other Ambulatory Visit: Payer: Self-pay

## 2020-03-05 VITALS — BP 134/68 | HR 74 | Ht 59.0 in | Wt 126.0 lb

## 2020-03-05 DIAGNOSIS — Z794 Long term (current) use of insulin: Secondary | ICD-10-CM | POA: Diagnosis not present

## 2020-03-05 DIAGNOSIS — I10 Essential (primary) hypertension: Secondary | ICD-10-CM | POA: Diagnosis not present

## 2020-03-05 DIAGNOSIS — E1165 Type 2 diabetes mellitus with hyperglycemia: Secondary | ICD-10-CM

## 2020-03-05 DIAGNOSIS — E1142 Type 2 diabetes mellitus with diabetic polyneuropathy: Secondary | ICD-10-CM

## 2020-03-05 LAB — POCT GLYCOSYLATED HEMOGLOBIN (HGB A1C): Hemoglobin A1C: 6.9 % — AB (ref 4.0–5.6)

## 2020-03-05 MED ORDER — PREGABALIN 25 MG PO CAPS: 25.0000 mg | ORAL_CAPSULE | Freq: Every day | ORAL | 1 refills | Status: DC

## 2020-03-05 MED ORDER — ACCU-CHEK SOFTCLIX LANCETS MISC: 2 refills | Status: AC

## 2020-03-05 NOTE — Patient Instructions (Addendum)
Take Novolog just after Breakfast and supper  Take Lyrica for pain after supper or at bedtime for leg pains

## 2020-03-05 NOTE — Progress Notes (Signed)
Patient ID: Vanessa Gibson, female   DOB: 1928/06/02, 86 y.o.   MRN: [id number]           Reason for Appointment: Follow-up for Type 2 Diabetes   History of Present Illness:          Date of diagnosis of type 2 diabetes mellitus: 1997        Background history:   She had been treated with metformin mostly since her diagnosis At some point was given glipizide/metformin combination, detailed history is not available from PCP Also not clear what level of control she has had until recently  Recent history:   Non-insulin hypoglycemic drugs the patient is taking are: none   INSULIN dose: Tresiba 18-20 units at night,  Novolog 6 units at breakfast and 8 at supper  Her A1c is now 6.6, highest 7.6 more recently in 2/20   Current management, blood sugar patterns and problems identified:   She has been very consistent with checking her blood sugar 3 times a day  She is usually taking her blood sugar readings at breakfast late afternoon and around 8-9 PM  She was told on her last visit to make sure she takes her insulin for meals right before eating  However she is not give inconsistent answers on whether she is doing this or taking it still 1 hour before her meals  However no hypoglycemia reported  She likes to do her [name] separately at bedtime  FASTING blood sugars are excellent with only some variability  She is asking about trying new medications currently and stop insulin  She is not able to do any exercise         Side effects from medications have been: ?  Nausea from metformin    Glucose monitoring:  done 1-2 times a day         Glucometer:  Accu-Chek  Blood Glucose readings from meter   PRE-MEAL Fasting Lunch  3 PM  8-9 PM Overall  Glucose range:  91-137   83-186  103-235   Mean/median: 131   135 141 136   .  Previous readings:  PRE-MEAL Fasting Lunch Dinner Bedtime Overall  Glucose range: 107-164   110-272  102-258   Mean/median:  134   180  182 166    POST-MEAL PC Breakfast PC Lunch PC Dinner  Glucose range:    331  Mean/median:         Self-care: The diet that the patient has been following is: tries to limit sweets  She has her meals with assisted living facility     Typical meal intake: Breakfast is cereal, sometimes bacon.  Lunch: yogurt, soup, cottage cheese or peanut butter with apple, or other light meal usually           Dietician visit, most recent: Never CDE visit: 1/18                Weight history: Previous range 98-140   Wt Readings from Last 3 Encounters:  03/05/20 126 lb (57.2 kg)  02/01/20 127 lb (57.6 kg)  01/16/20 122 lb 12.8 oz (55.7 kg)    Glycemic control:  A1c in October 2017 = 8.5    Lab Results  Component Value Date   HGBA1C 6.9 (A) 03/05/2020   HGBA1C 6.6 (A) 12/06/2019   HGBA1C 6.2 (A) 08/24/2019   Lab Results  Component Value Date   MICROALBUR <0.7 08/24/2019   LDLCALC 77 08/24/2019   CREATININE 1.34 (H) 01/16/2020  Lab Results  Component Value Date   MICRALBCREAT 3.1 08/24/2019       Allergies as of 03/05/2020   No Known Allergies     Medication List       Accurate as of March 05, 2020  1:12 PM. If you have any questions, ask your nurse or doctor.        Accu-Chek Guide test strip Generic drug: glucose blood CHECK BLOOD SUGAR THREE TO FOUR TIMES DAILY   acetaminophen 325 MG tablet Commonly known as: TYLENOL Take 2 tablets (650 mg total) by mouth every 6 (six) hours as needed for moderate pain or headache.   amLODipine 5 MG tablet Commonly known as: NORVASC Take 1 tablet (5 mg total) by mouth daily.   aspirin EC 81 MG tablet Take 81 mg by mouth daily.   azelastine 0.1 % nasal spray Commonly known as: ASTELIN Place 1 spray into both nostrils 2 (two) times daily. Use in each nostril as directed   B-D SINGLE USE SWABS REGULAR Pads USE TO CLEAN FINGER BEFORE CHECKING BLOOD SUGARS AND CLEAN SKIN BEFORE INSULIN INJECTIONS.   BD Pen Needle Nano U/F 32G X 4  MM Misc Generic drug: Insulin Pen Needle USE TO INJECT INSULINS 4 TIMES DAILY.   Centrum Silver 50+Women Tabs Take 1 tablet by mouth daily.   citalopram 10 MG tablet Commonly known as: CELEXA Take 1 tablet (10 mg total) by mouth daily.   diclofenac Sodium 1 % Gel Commonly known as: Voltaren Apply 4 g topically 4 (four) times daily.   fluticasone 50 MCG/ACT nasal spray Commonly known as: FLONASE Place 2 sprays into both nostrils daily.   levocetirizine 5 MG tablet Commonly known as: Xyzal Take 0.5 tablets (2.5 mg total) by mouth every evening.   LORazepam 0.5 MG tablet Commonly known as: ATIVAN TAKE 1 TABLET AT BEDTIME   lovastatin 20 MG tablet Commonly known as: MEVACOR Take 1 tablet (20 mg total) by mouth at bedtime.   metoprolol tartrate 25 MG tablet Commonly known as: LOPRESSOR Take 0.5 tablets (12.5 mg total) by mouth 2 (two) times daily. TAKE 1 TABLET BY MOUTH TWICE DAILY.   montelukast 10 MG tablet Commonly known as: SINGULAIR Take 1 tablet (10 mg total) by mouth at bedtime.   NovoLOG FlexPen 100 UNIT/ML FlexPen Generic drug: insulin aspart Inject into the skin in the morning and at bedtime. Inject 6 units under the skin before breakfast and 8 units before supper. What changed: Another medication with the same name was removed. Continue taking this medication, and follow the directions you see here. Changed by: Elayne Snare, MD   ofloxacin 0.3 % OTIC solution Commonly known as: FLOXIN INSTILL 5 DROPS IN EACH EAR ONCE A DAY.   Olopatadine HCl 0.2 % Soln Commonly known as: Pataday Apply 1 drop to each eye once daily.   omeprazole 40 MG capsule Commonly known as: PRILOSEC Take 1 capsule (40 mg total) by mouth daily.   Stiolto Respimat 2.5-2.5 MCG/ACT Aers Generic drug: Tiotropium Bromide-Olodaterol Inhale 2 puffs into the lungs daily.   Tyler Aas FlexTouch 100 UNIT/ML FlexTouch Pen Generic drug: insulin degludec INJECT 20 UNITS SUBCUTANEOUSLY ONE TIME  DAILY   TRUEplus Lancets 28G Misc CHECK BLOOD SUGAR 3 TO 4 TIMES DAILY.   Accu-Chek Softclix Lancets lancets Use as instructed to check blood sugar 3-4 times daily.       Allergies: No Known Allergies  Past Medical History:  Diagnosis Date  . Abnormal EKG    RBBB and  LAFB  . Anxiety and depression   . Asthma   . Axillary adenopathy 01/2020   CT 12/2019 incidental finding, pt had recent covid vaccine in ipsilateral arm.  F/u axillary u/s 1 mo later showed dec in size of node.  F/u axillary u/s 3 mo recommended.  . Chronic renal insufficiency, stage 3 (moderate)    GFR @40   . Diabetes mellitus ([place])   . Dizziness   . GERD (gastroesophageal reflux disease)   . Hard of hearing   . Hiatal hernia    Moderate size (contributing to pt's DOE?)  . History of bilateral breast cancer approx age 59   bilat mastectomy then tamoxifen x 10 yrs  . Hyperlipemia   . Hypertension   . IBS (irritable bowel syndrome)   . Mandibular fracture ([place]) 12/04/2015  . MI (myocardial infarction) ([place])   . Osteoarthritis, multiple sites   . Skin cancer of face   . Ventral hernia "many years"   supraumbilical    Past Surgical History:  Procedure Laterality Date  . BREAST SURGERY    . Cardiac event monitor  2016   Normal  . CATARACT EXTRACTION W/ INTRAOCULAR LENS  IMPLANT, BILATERAL Bilateral   . CHOLECYSTECTOMY OPEN  1970's  . MASTECTOMY Bilateral   . PFTs  04/05/2018   Interpretation: The FVC, FEV1, FEV1/FVC ratio and FEF25-75% are within normal limits. Patient effort was erratic, but mild dec in diff capacity noted--mild dec functional alveolar tissue  . TRANSTHORACIC ECHOCARDIOGRAM  11/09/2019   EF 60-65%, grd I DD.    History reviewed. No pertinent family history.  Social History:  reports that she has never smoked. She has never used smokeless tobacco. She reports that she does not drink alcohol or use drugs.   Review of Systems   Lipid history:  She is on lovastatin 20 mg from her  PCP with labs as follows:    Lab Results  Component Value Date   CHOL 158 08/24/2019   HDL 50.50 08/24/2019   LDLCALC 77 08/24/2019   TRIG 153.0 (H) 08/24/2019   CHOLHDL 3 08/24/2019           Hypertension:  She is only on metoprolol 25 mg, Has history of CAD  She will at times have swelling of her feet, not on diuretics currently Asking about frequent urination  Lab Results  Component Value Date   CREATININE 1.34 (H) 01/16/2020   CREATININE 1.31 (H) 10/10/2019   CREATININE 1.33 (H) 08/24/2019     Lab Results  Component Value Date   CREATININE 1.34 (H) 01/16/2020   BUN 29 (H) 01/16/2020   NA 133 (L) 01/16/2020   K 4.8 01/16/2020   CL 100 01/16/2020   CO2 28 01/16/2020    Last eye exam reported in 1/21 is to get some injections in her eyes again  She has had chronic tingling and discomfort in her hands  She still has multiple aches and pains, has sometimes sharp pains in her legs Apparently she had some nonspecific side effects from gabapentin Has not taken any since 2019  Foot exam was done in 9/20   Physical Examination:  BP 134/68 (BP Location: Right Arm, Patient Position: Sitting, Cuff Size: Normal)   Pulse 74   Ht 4\' 11"  (1.499 m)   Wt 126 lb (57.2 kg)   LMP  (LMP Unknown)   SpO2 93%   BMI 25.45 kg/m    Mild swelling of the feet present  ASSESSMENT:  Diabetes type 2, nonobese,  insulin-requiring  See history of present illness for detailed discussion of current diabetes management, blood sugar patterns and problems identified   She is on basal bolus insulin regimen with failure to respond to oral agents  Her A1c is slightly higher at 6.9  However considering her age and comorbid conditions she is doing very well Recently her blood sugars at home are averaging only 135 including readings after lunch and dinner No hypoglycemia also  She is having relatively stable fasting readings and an average of about 130 which is excellent with the  current dose of [name] She does need to be given instruction in writing and not clear if she is taking her NovoLog insulin right after eating; she cannot take this before the meal as she has to take her injection before she goes to the dining room especially at suppertime  Her diet is again variable based on her meals at the restaurant  RENAL dysfunction: This is mild and stable  NEUROPATHY with pains and paresthesia in hands and feet: May be able to try Lyrica since she had reportedly some side effects from gabapentin  Blood pressure well controlled, taking Norvasc from PCP  PLAN:    No change in insulin doses She can take her Tyler Aas around 8:30 PM when she checks her blood sugar instead of bedtime She may build to cut back on her blood sugar testing frequency but she prefers to continue 3 times a day She was given written instructions on taking her mealtime insulin right after she finishes her meal and comes back to her room Will not try her on any oral agents since unlikely that she will respond after having diabetes for 24 years  Trial of LYRICA 25 mg at dinnertime for neuropathic and other pains  There are no Patient Instructions on file for this visit.      Elayne Snare 03/05/2020, 1:12 PM   Note: This office note was prepared with Dragon voice recognition system technology. Any transcriptional errors that result from this process are unintentional.

## 2020-03-07 DIAGNOSIS — Z20828 Contact with and (suspected) exposure to other viral communicable diseases: Secondary | ICD-10-CM | POA: Diagnosis not present

## 2020-03-07 DIAGNOSIS — Z1159 Encounter for screening for other viral diseases: Secondary | ICD-10-CM | POA: Diagnosis not present

## 2020-03-11 ENCOUNTER — Encounter: Payer: Self-pay | Admitting: Family Medicine

## 2020-03-14 DIAGNOSIS — Z20828 Contact with and (suspected) exposure to other viral communicable diseases: Secondary | ICD-10-CM | POA: Diagnosis not present

## 2020-03-14 DIAGNOSIS — Z1159 Encounter for screening for other viral diseases: Secondary | ICD-10-CM | POA: Diagnosis not present

## 2020-03-21 DIAGNOSIS — Z20828 Contact with and (suspected) exposure to other viral communicable diseases: Secondary | ICD-10-CM | POA: Diagnosis not present

## 2020-03-21 DIAGNOSIS — Z1159 Encounter for screening for other viral diseases: Secondary | ICD-10-CM | POA: Diagnosis not present

## 2020-03-28 DIAGNOSIS — Z1159 Encounter for screening for other viral diseases: Secondary | ICD-10-CM | POA: Diagnosis not present

## 2020-03-28 DIAGNOSIS — Z7409 Other reduced mobility: Secondary | ICD-10-CM | POA: Diagnosis not present

## 2020-03-28 DIAGNOSIS — E119 Type 2 diabetes mellitus without complications: Secondary | ICD-10-CM | POA: Diagnosis not present

## 2020-03-28 DIAGNOSIS — M79642 Pain in left hand: Secondary | ICD-10-CM | POA: Diagnosis not present

## 2020-03-28 DIAGNOSIS — Z20828 Contact with and (suspected) exposure to other viral communicable diseases: Secondary | ICD-10-CM | POA: Diagnosis not present

## 2020-03-28 DIAGNOSIS — M79641 Pain in right hand: Secondary | ICD-10-CM | POA: Diagnosis not present

## 2020-03-28 DIAGNOSIS — Z794 Long term (current) use of insulin: Secondary | ICD-10-CM | POA: Diagnosis not present

## 2020-03-28 DIAGNOSIS — I10 Essential (primary) hypertension: Secondary | ICD-10-CM | POA: Diagnosis not present

## 2020-03-30 DIAGNOSIS — Z9181 History of falling: Secondary | ICD-10-CM | POA: Diagnosis not present

## 2020-03-30 DIAGNOSIS — M6281 Muscle weakness (generalized): Secondary | ICD-10-CM | POA: Diagnosis not present

## 2020-03-30 DIAGNOSIS — R41841 Cognitive communication deficit: Secondary | ICD-10-CM | POA: Diagnosis not present

## 2020-04-04 DIAGNOSIS — Z20828 Contact with and (suspected) exposure to other viral communicable diseases: Secondary | ICD-10-CM | POA: Diagnosis not present

## 2020-04-04 DIAGNOSIS — Z1159 Encounter for screening for other viral diseases: Secondary | ICD-10-CM | POA: Diagnosis not present

## 2020-04-05 ENCOUNTER — Other Ambulatory Visit: Payer: Self-pay

## 2020-04-05 ENCOUNTER — Encounter (INDEPENDENT_AMBULATORY_CARE_PROVIDER_SITE_OTHER): Payer: Self-pay | Admitting: Ophthalmology

## 2020-04-05 ENCOUNTER — Ambulatory Visit (INDEPENDENT_AMBULATORY_CARE_PROVIDER_SITE_OTHER): Payer: Medicare HMO | Admitting: Ophthalmology

## 2020-04-05 ENCOUNTER — Encounter (INDEPENDENT_AMBULATORY_CARE_PROVIDER_SITE_OTHER): Payer: Self-pay

## 2020-04-05 ENCOUNTER — Encounter (INDEPENDENT_AMBULATORY_CARE_PROVIDER_SITE_OTHER): Payer: Medicare HMO | Admitting: Ophthalmology

## 2020-04-05 DIAGNOSIS — H353211 Exudative age-related macular degeneration, right eye, with active choroidal neovascularization: Secondary | ICD-10-CM

## 2020-04-05 DIAGNOSIS — Z79899 Other long term (current) drug therapy: Secondary | ICD-10-CM | POA: Diagnosis not present

## 2020-04-05 DIAGNOSIS — H353122 Nonexudative age-related macular degeneration, left eye, intermediate dry stage: Secondary | ICD-10-CM

## 2020-04-05 DIAGNOSIS — R2681 Unsteadiness on feet: Secondary | ICD-10-CM | POA: Diagnosis not present

## 2020-04-05 DIAGNOSIS — H353221 Exudative age-related macular degeneration, left eye, with active choroidal neovascularization: Secondary | ICD-10-CM | POA: Diagnosis not present

## 2020-04-05 DIAGNOSIS — M6281 Muscle weakness (generalized): Secondary | ICD-10-CM | POA: Diagnosis not present

## 2020-04-05 DIAGNOSIS — H35721 Serous detachment of retinal pigment epithelium, right eye: Secondary | ICD-10-CM

## 2020-04-05 MED ORDER — BEVACIZUMAB CHEMO INJECTION 1.25MG/0.05ML SYRINGE FOR KALEIDOSCOPE
1.2500 mg | INTRAVITREAL | Status: AC | PRN
  Administered 2020-04-05: 1.25 mg via INTRAVITREAL

## 2020-04-05 NOTE — Assessment & Plan Note (Signed)
The nature of wet macular degeneration was discussed with the patient.  Forms of therapy reviewed include the use of Anti-VEGF medications injected painlessly into the eye, as well as other possible treatment modalities, including thermal laser therapy. Fellow eye involvement and risks were discussed with the patient. Upon the finding of wet age related macular degeneration, treatment will be offered. The treatment regimen is on a treat as needed basis with the intent to treat if necessary and extend interval of exams when possible. On average 1 out of 6 patients do not need lifetime therapy. However, the risk of recurrent disease is high for a lifetime.  Initially monthly, then periodic, examinations and evaluations will determine whether the next treatment is required on the day of the examination.  OS, no active CNVM noted today examination at 6-week interval.  Fully improved overall from active disease noted August 2020 repeat intravitreal Avastin OS today and exam in 9 weeks

## 2020-04-05 NOTE — Assessment & Plan Note (Signed)
OD, with new onset subretinal fluid slightly worse.  Stable overall vastly improved from onset advanced disease August 2020 repeat intravitreal Avastin OD today and examination in 5 weeks

## 2020-04-05 NOTE — Progress Notes (Signed)
04/05/2020     CHIEF COMPLAINT Patient presents for Retina Follow Up   HISTORY OF PRESENT ILLNESS: Vanessa Gibson is a 86 y.o. female who presents to the clinic today for:   HPI    Retina Follow Up    Patient presents with  Wet AMD.  In both eyes.  Severity is moderate.  Duration of 6 weeks.  Since onset it is stable.  I, the attending physician,  performed the HPI with the patient and updated documentation appropriately.          Comments    6 Week AMD f\u OU. Possible Avastin OU. OCT  Pt states vision has slightly improved. Denies any complaints.       Last edited by Tilda Franco on 04/05/2020  2:40 PM. (History)      Referring physician: Tammi Sou, MD [address] [place],  [place] [address]  HISTORICAL INFORMATION:   Selected notes from the MEDICAL RECORD NUMBER    Lab Results  Component Value Date   HGBA1C 6.9 (A) 03/05/2020     CURRENT MEDICATIONS: Current Outpatient Medications (Ophthalmic Drugs)  Medication Sig  . Olopatadine HCl (PATADAY) 0.2 % SOLN Apply 1 drop to each eye once daily.   No current facility-administered medications for this visit. (Ophthalmic Drugs)   Current Outpatient Medications (Other)  Medication Sig  . ACCU-CHEK GUIDE test strip CHECK BLOOD SUGAR THREE TO FOUR TIMES DAILY  . Accu-Chek Softclix Lancets lancets Use as instructed to check blood sugar 3-4 times daily.  Marland Kitchen acetaminophen (TYLENOL) 325 MG tablet Take 2 tablets (650 mg total) by mouth every 6 (six) hours as needed for moderate pain or headache.  . Alcohol Swabs (B-D SINGLE USE SWABS REGULAR) PADS USE TO CLEAN FINGER BEFORE CHECKING BLOOD SUGARS AND CLEAN SKIN BEFORE INSULIN INJECTIONS.  Marland Kitchen amLODipine (NORVASC) 5 MG tablet Take 1 tablet (5 mg total) by mouth daily.  Marland Kitchen aspirin EC 81 MG tablet Take 81 mg by mouth daily.  Marland Kitchen azelastine (ASTELIN) 0.1 % nasal spray Place 1 spray into both nostrils 2 (two) times daily. Use in each nostril as directed  . BD PEN NEEDLE  NANO U/F 32G X 4 MM MISC USE TO INJECT INSULINS 4 TIMES DAILY.  . citalopram (CELEXA) 10 MG tablet Take 1 tablet (10 mg total) by mouth daily.  . diclofenac Sodium (VOLTAREN) 1 % GEL Apply 4 g topically 4 (four) times daily.  . fluticasone (FLONASE) 50 MCG/ACT nasal spray Place 2 sprays into both nostrils daily.  . insulin aspart (NOVOLOG FLEXPEN) 100 UNIT/ML FlexPen Inject into the skin in the morning and at bedtime. Inject 6 units under the skin before breakfast and 8 units before supper.  . levocetirizine (XYZAL) 5 MG tablet Take 0.5 tablets (2.5 mg total) by mouth every evening.  Marland Kitchen LORazepam (ATIVAN) 0.5 MG tablet TAKE 1 TABLET AT BEDTIME  . lovastatin (MEVACOR) 20 MG tablet Take 1 tablet (20 mg total) by mouth at bedtime.  . metoprolol tartrate (LOPRESSOR) 25 MG tablet Take 0.5 tablets (12.5 mg total) by mouth 2 (two) times daily. TAKE 1 TABLET BY MOUTH TWICE DAILY.  . montelukast (SINGULAIR) 10 MG tablet Take 1 tablet (10 mg total) by mouth at bedtime.  . Multiple Vitamins-Minerals (CENTRUM SILVER 50+WOMEN) TABS Take 1 tablet by mouth daily.  Marland Kitchen ofloxacin (FLOXIN) 0.3 % OTIC solution INSTILL 5 DROPS IN EACH EAR ONCE A DAY.  Marland Kitchen omeprazole (PRILOSEC) 40 MG capsule Take 1 capsule (40  mg total) by mouth daily.  . pregabalin (LYRICA) 25 MG capsule Take 1 capsule (25 mg total) by mouth daily after supper.  . Tiotropium Bromide-Olodaterol (STIOLTO RESPIMAT) 2.5-2.5 MCG/ACT AERS Inhale 2 puffs into the lungs daily.  . TRESIBA FLEXTOUCH 100 UNIT/ML SOPN FlexTouch Pen INJECT 20 UNITS SUBCUTANEOUSLY ONE TIME DAILY  . TRUEplus Lancets 28G MISC CHECK BLOOD SUGAR 3 TO 4 TIMES DAILY.   No current facility-administered medications for this visit. (Other)      REVIEW OF SYSTEMS:    ALLERGIES No Known Allergies  PAST MEDICAL HISTORY Past Medical History:  Diagnosis Date  . Abnormal EKG    RBBB and LAFB  . Anxiety and depression   . Asthma   . Axillary adenopathy 01/2020   CT 12/2019 incidental  finding, pt had recent covid vaccine in ipsilateral arm.  F/u axillary u/s 1 mo later showed dec in size of node.  F/u axillary u/s 3 mo recommended.  . Chronic renal insufficiency, stage 3 (moderate)    GFR @40   . Diabetes mellitus ([place])    managed by endo Dr. Dwyane Dee  . Dizziness   . GERD (gastroesophageal reflux disease)   . Hard of hearing   . Hiatal hernia    Moderate size (contributing to pt's DOE?)  . History of bilateral breast cancer approx age 28   bilat mastectomy then tamoxifen x 10 yrs  . Hyperlipemia   . Hypertension   . IBS (irritable bowel syndrome)   . Mandibular fracture ([place]) 12/04/2015  . MI (myocardial infarction) ([place])   . Osteoarthritis, multiple sites   . Paresthesias    burning, hands and feet.  Gabapentin no help.  Dr. Dwyane Dee started her on lyrica 02/2020.  . Skin cancer of face   . Ventral hernia "many years"   supraumbilical   Past Surgical History:  Procedure Laterality Date  . BREAST SURGERY    . Cardiac event monitor  2016   Normal  . CATARACT EXTRACTION W/ INTRAOCULAR LENS  IMPLANT, BILATERAL Bilateral   . CHOLECYSTECTOMY OPEN  1970's  . MASTECTOMY Bilateral   . PFTs  04/05/2018   Interpretation: The FVC, FEV1, FEV1/FVC ratio and FEF25-75% are within normal limits. Patient effort was erratic, but mild dec in diff capacity noted--mild dec functional alveolar tissue  . TRANSTHORACIC ECHOCARDIOGRAM  11/09/2019   EF 60-65%, grd I DD.    FAMILY HISTORY History reviewed. No pertinent family history.  SOCIAL HISTORY Social History   Tobacco Use  . Smoking status: Never Smoker  . Smokeless tobacco: Never Used  Substance Use Topics  . Alcohol use: No  . Drug use: No         OPHTHALMIC EXAM: Base Eye Exam    Visual Acuity (Snellen - Linear)      Right Left   Dist [place] 20/80 20/60 -2   Dist ph [place] 20/50 -2 20/50 -1       Tonometry (Tonopen, 2:45 PM)      Right Left   Pressure 19 19       Pupils      Pupils Dark Light Shape React APD    Right PERRL 3 3 Round Minimal None   Left PERRL 3 2.5 Round Sluggish None       Visual Fields (Counting fingers)      Left Right    Full Full       Neuro/Psych    Oriented x3: Yes   Mood/Affect: Normal  Dilation    Both eyes: 1.0% Mydriacyl, 2.5% Phenylephrine @ 2:45 PM        Slit Lamp and Fundus Exam    Slit Lamp Exam      Right Left   Lids/Lashes Normal Normal   Conjunctiva/Sclera White and quiet White and quiet   Cornea Clear Clear   Anterior Chamber Deep and quiet Deep and quiet   Iris Round and reactive Round and reactive   Lens Posterior chamber intraocular lens Posterior chamber intraocular lens   Anterior Vitreous Normal Normal       Fundus Exam      Right Left   Posterior Vitreous Posterior vitreous detachment Posterior vitreous detachment   Disc Normal Normal   C/D Ratio 0.35 0.35   Macula Subretinal neovascular membrane, Macular thickening, Retinal pigment epithelial atrophy, Retinal pigment epithelial mottling, Retinal pigment epithelial detachment Soft drusen, no macular thickening, no hemorrhage, no exudates   Vessels Normal Normal   Periphery Normal Normal          IMAGING AND PROCEDURES  Imaging and Procedures for 04/05/20           ASSESSMENT/PLAN:  No problem-specific Assessment & Plan notes found for this encounter.      ICD-10-CM   1. Exudative age-related macular degeneration of right eye with active choroidal neovascularization (HCC)  H35.3211 OCT, Retina - OU - Both Eyes  2. Exudative age-related macular degeneration of left eye with active choroidal neovascularization (HCC)  H35.3221 OCT, Retina - OU - Both Eyes  3. Intermediate stage nonexudative age-related macular degeneration of left eye  H35.3122   4. Retinal pigment epithelium serous detachment, right  H35.721     1.OD, with new onset subretinal fluid slightly worse.  Stable overall vastly improved from onset advanced disease August 2020 repeat intravitreal Avastin  OD today and examination in 5 weeks  2.OS, no active CNVM noted today examination at 6-week interval.  Fully improved overall from active disease noted August 2020 repeat intravitreal Avastin OS today and exam in 9 weeks  3.  Ophthalmic Meds Ordered this visit:  No orders of the defined types were placed in this encounter.      No follow-ups on file.  There are no Patient Instructions on file for this visit.   Explained the diagnoses, plan, and follow up with the patient and they expressed understanding.  Patient expressed understanding of the importance of proper follow up care.   Clent Demark Benjiman Sedgwick M.D. Diseases & Surgery of the Retina and Vitreous [facility] [place] 04/05/20     Abbreviations: M myopia (nearsighted); A astigmatism; H hyperopia (farsighted); P presbyopia; Mrx spectacle prescription;  CTL contact lenses; OD right eye; OS left eye; OU both eyes  XT exotropia; ET esotropia; PEK punctate epithelial keratitis; PEE punctate epithelial erosions; DES dry eye syndrome; MGD meibomian gland dysfunction; ATs artificial tears; PFAT's preservative free artificial tears; [place] nuclear sclerotic cataract; PSC posterior subcapsular cataract; ERM epi-retinal membrane; PVD posterior vitreous detachment; RD retinal detachment; DM diabetes mellitus; DR diabetic retinopathy; NPDR non-proliferative diabetic retinopathy; PDR proliferative diabetic retinopathy; CSME clinically significant macular edema; DME diabetic macular edema; dbh dot blot hemorrhages; CWS cotton wool spot; POAG primary open angle glaucoma; C/D cup-to-disc ratio; HVF humphrey visual field; GVF goldmann visual field; OCT optical coherence tomography; IOP intraocular pressure; BRVO Branch retinal vein occlusion; CRVO central retinal vein occlusion; CRAO central retinal artery occlusion; BRAO branch retinal artery occlusion; RT retinal tear; SB scleral buckle; PPV pars plana vitrectomy;  VH Vitreous hemorrhage; PRP  panretinal laser photocoagulation; IVK intravitreal kenalog; VMT vitreomacular traction; MH Macular hole;  NVD neovascularization of the disc; NVE neovascularization elsewhere; AREDS age related eye disease study; ARMD age related macular degeneration; POAG primary open angle glaucoma; EBMD epithelial/anterior basement membrane dystrophy; ACIOL anterior chamber intraocular lens; IOL intraocular lens; PCIOL posterior chamber intraocular lens; Phaco/IOL phacoemulsification with intraocular lens placement; [place] photorefractive keratectomy; LASIK laser assisted in situ keratomileusis; HTN hypertension; DM diabetes mellitus; COPD chronic obstructive pulmonary disease

## 2020-04-06 DIAGNOSIS — M6281 Muscle weakness (generalized): Secondary | ICD-10-CM | POA: Diagnosis not present

## 2020-04-06 DIAGNOSIS — R41841 Cognitive communication deficit: Secondary | ICD-10-CM | POA: Diagnosis not present

## 2020-04-06 DIAGNOSIS — Z9181 History of falling: Secondary | ICD-10-CM | POA: Diagnosis not present

## 2020-04-09 DIAGNOSIS — M6281 Muscle weakness (generalized): Secondary | ICD-10-CM | POA: Diagnosis not present

## 2020-04-09 DIAGNOSIS — Z9181 History of falling: Secondary | ICD-10-CM | POA: Diagnosis not present

## 2020-04-09 DIAGNOSIS — R41841 Cognitive communication deficit: Secondary | ICD-10-CM | POA: Diagnosis not present

## 2020-04-11 DIAGNOSIS — Z1159 Encounter for screening for other viral diseases: Secondary | ICD-10-CM | POA: Diagnosis not present

## 2020-04-11 DIAGNOSIS — Z20828 Contact with and (suspected) exposure to other viral communicable diseases: Secondary | ICD-10-CM | POA: Diagnosis not present

## 2020-04-12 DIAGNOSIS — M6281 Muscle weakness (generalized): Secondary | ICD-10-CM | POA: Diagnosis not present

## 2020-04-12 DIAGNOSIS — Z9181 History of falling: Secondary | ICD-10-CM | POA: Diagnosis not present

## 2020-04-12 DIAGNOSIS — R41841 Cognitive communication deficit: Secondary | ICD-10-CM | POA: Diagnosis not present

## 2020-04-13 DIAGNOSIS — M6281 Muscle weakness (generalized): Secondary | ICD-10-CM | POA: Diagnosis not present

## 2020-04-13 DIAGNOSIS — R2681 Unsteadiness on feet: Secondary | ICD-10-CM | POA: Diagnosis not present

## 2020-04-16 DIAGNOSIS — M6281 Muscle weakness (generalized): Secondary | ICD-10-CM | POA: Diagnosis not present

## 2020-04-16 DIAGNOSIS — R2681 Unsteadiness on feet: Secondary | ICD-10-CM | POA: Diagnosis not present

## 2020-04-17 DIAGNOSIS — R41841 Cognitive communication deficit: Secondary | ICD-10-CM | POA: Diagnosis not present

## 2020-04-17 DIAGNOSIS — Z9181 History of falling: Secondary | ICD-10-CM | POA: Diagnosis not present

## 2020-04-17 DIAGNOSIS — M6281 Muscle weakness (generalized): Secondary | ICD-10-CM | POA: Diagnosis not present

## 2020-04-18 ENCOUNTER — Other Ambulatory Visit: Payer: Self-pay

## 2020-04-18 ENCOUNTER — Ambulatory Visit: Payer: Medicare HMO | Admitting: Family Medicine

## 2020-04-18 DIAGNOSIS — F339 Major depressive disorder, recurrent, unspecified: Secondary | ICD-10-CM

## 2020-04-18 MED ORDER — CITALOPRAM HYDROBROMIDE 10 MG PO TABS: 10.0000 mg | ORAL_TABLET | Freq: Every day | ORAL | 1 refills | Status: DC

## 2020-04-19 DIAGNOSIS — R41841 Cognitive communication deficit: Secondary | ICD-10-CM | POA: Diagnosis not present

## 2020-04-19 DIAGNOSIS — Z9181 History of falling: Secondary | ICD-10-CM | POA: Diagnosis not present

## 2020-04-19 DIAGNOSIS — R2681 Unsteadiness on feet: Secondary | ICD-10-CM | POA: Diagnosis not present

## 2020-04-19 DIAGNOSIS — M6281 Muscle weakness (generalized): Secondary | ICD-10-CM | POA: Diagnosis not present

## 2020-04-20 DIAGNOSIS — M6281 Muscle weakness (generalized): Secondary | ICD-10-CM | POA: Diagnosis not present

## 2020-04-20 DIAGNOSIS — R2681 Unsteadiness on feet: Secondary | ICD-10-CM | POA: Diagnosis not present

## 2020-04-24 DIAGNOSIS — R41841 Cognitive communication deficit: Secondary | ICD-10-CM | POA: Diagnosis not present

## 2020-04-24 DIAGNOSIS — Z9181 History of falling: Secondary | ICD-10-CM | POA: Diagnosis not present

## 2020-04-24 DIAGNOSIS — M6281 Muscle weakness (generalized): Secondary | ICD-10-CM | POA: Diagnosis not present

## 2020-04-25 DIAGNOSIS — Z1159 Encounter for screening for other viral diseases: Secondary | ICD-10-CM | POA: Diagnosis not present

## 2020-04-25 DIAGNOSIS — R2681 Unsteadiness on feet: Secondary | ICD-10-CM | POA: Diagnosis not present

## 2020-04-25 DIAGNOSIS — Z20828 Contact with and (suspected) exposure to other viral communicable diseases: Secondary | ICD-10-CM | POA: Diagnosis not present

## 2020-04-25 DIAGNOSIS — M6281 Muscle weakness (generalized): Secondary | ICD-10-CM | POA: Diagnosis not present

## 2020-04-26 DIAGNOSIS — M6281 Muscle weakness (generalized): Secondary | ICD-10-CM | POA: Diagnosis not present

## 2020-04-26 DIAGNOSIS — R41841 Cognitive communication deficit: Secondary | ICD-10-CM | POA: Diagnosis not present

## 2020-04-26 DIAGNOSIS — Z9181 History of falling: Secondary | ICD-10-CM | POA: Diagnosis not present

## 2020-04-27 DIAGNOSIS — R2681 Unsteadiness on feet: Secondary | ICD-10-CM | POA: Diagnosis not present

## 2020-04-27 DIAGNOSIS — M6281 Muscle weakness (generalized): Secondary | ICD-10-CM | POA: Diagnosis not present

## 2020-04-30 DIAGNOSIS — C4442 Squamous cell carcinoma of skin of scalp and neck: Secondary | ICD-10-CM | POA: Diagnosis not present

## 2020-04-30 DIAGNOSIS — C44329 Squamous cell carcinoma of skin of other parts of face: Secondary | ICD-10-CM | POA: Diagnosis not present

## 2020-04-30 DIAGNOSIS — L821 Other seborrheic keratosis: Secondary | ICD-10-CM | POA: Diagnosis not present

## 2020-04-30 DIAGNOSIS — Z85828 Personal history of other malignant neoplasm of skin: Secondary | ICD-10-CM | POA: Diagnosis not present

## 2020-05-01 DIAGNOSIS — R2681 Unsteadiness on feet: Secondary | ICD-10-CM | POA: Diagnosis not present

## 2020-05-01 DIAGNOSIS — R41841 Cognitive communication deficit: Secondary | ICD-10-CM | POA: Diagnosis not present

## 2020-05-01 DIAGNOSIS — M6281 Muscle weakness (generalized): Secondary | ICD-10-CM | POA: Diagnosis not present

## 2020-05-01 DIAGNOSIS — Z9181 History of falling: Secondary | ICD-10-CM | POA: Diagnosis not present

## 2020-05-02 DIAGNOSIS — M6281 Muscle weakness (generalized): Secondary | ICD-10-CM | POA: Diagnosis not present

## 2020-05-02 DIAGNOSIS — Z1159 Encounter for screening for other viral diseases: Secondary | ICD-10-CM | POA: Diagnosis not present

## 2020-05-02 DIAGNOSIS — Z9181 History of falling: Secondary | ICD-10-CM | POA: Diagnosis not present

## 2020-05-02 DIAGNOSIS — R41841 Cognitive communication deficit: Secondary | ICD-10-CM | POA: Diagnosis not present

## 2020-05-02 DIAGNOSIS — Z20828 Contact with and (suspected) exposure to other viral communicable diseases: Secondary | ICD-10-CM | POA: Diagnosis not present

## 2020-05-03 ENCOUNTER — Other Ambulatory Visit: Payer: Self-pay | Admitting: Family Medicine

## 2020-05-03 DIAGNOSIS — E1169 Type 2 diabetes mellitus with other specified complication: Secondary | ICD-10-CM

## 2020-05-03 DIAGNOSIS — M6281 Muscle weakness (generalized): Secondary | ICD-10-CM | POA: Diagnosis not present

## 2020-05-03 DIAGNOSIS — R2681 Unsteadiness on feet: Secondary | ICD-10-CM | POA: Diagnosis not present

## 2020-05-03 DIAGNOSIS — K219 Gastro-esophageal reflux disease without esophagitis: Secondary | ICD-10-CM

## 2020-05-04 ENCOUNTER — Other Ambulatory Visit: Payer: Self-pay

## 2020-05-04 DIAGNOSIS — M6281 Muscle weakness (generalized): Secondary | ICD-10-CM | POA: Diagnosis not present

## 2020-05-04 DIAGNOSIS — R41841 Cognitive communication deficit: Secondary | ICD-10-CM | POA: Diagnosis not present

## 2020-05-04 DIAGNOSIS — Z9181 History of falling: Secondary | ICD-10-CM | POA: Diagnosis not present

## 2020-05-07 ENCOUNTER — Other Ambulatory Visit: Payer: Self-pay

## 2020-05-07 ENCOUNTER — Encounter: Payer: Self-pay | Admitting: Family Medicine

## 2020-05-07 ENCOUNTER — Ambulatory Visit (INDEPENDENT_AMBULATORY_CARE_PROVIDER_SITE_OTHER): Payer: Medicare HMO | Admitting: Family Medicine

## 2020-05-07 VITALS — BP 123/68 | HR 69 | Temp 97.9°F | Resp 16 | Ht 59.0 in | Wt 123.6 lb

## 2020-05-07 DIAGNOSIS — I10 Essential (primary) hypertension: Secondary | ICD-10-CM | POA: Diagnosis not present

## 2020-05-07 DIAGNOSIS — R5382 Chronic fatigue, unspecified: Secondary | ICD-10-CM | POA: Diagnosis not present

## 2020-05-07 DIAGNOSIS — N189 Chronic kidney disease, unspecified: Secondary | ICD-10-CM

## 2020-05-07 DIAGNOSIS — R2681 Unsteadiness on feet: Secondary | ICD-10-CM | POA: Diagnosis not present

## 2020-05-07 DIAGNOSIS — I872 Venous insufficiency (chronic) (peripheral): Secondary | ICD-10-CM

## 2020-05-07 DIAGNOSIS — E785 Hyperlipidemia, unspecified: Secondary | ICD-10-CM

## 2020-05-07 DIAGNOSIS — F330 Major depressive disorder, recurrent, mild: Secondary | ICD-10-CM

## 2020-05-07 DIAGNOSIS — N3281 Overactive bladder: Secondary | ICD-10-CM

## 2020-05-07 DIAGNOSIS — M6281 Muscle weakness (generalized): Secondary | ICD-10-CM | POA: Diagnosis not present

## 2020-05-07 LAB — COMPREHENSIVE METABOLIC PANEL
ALT: 13 U/L (ref 0–35)
AST: 21 U/L (ref 0–37)
Albumin: 4.1 g/dL (ref 3.5–5.2)
Alkaline Phosphatase: 64 U/L (ref 39–117)
BUN: 28 mg/dL — ABNORMAL HIGH (ref 6–23)
CO2: 28 mEq/L (ref 19–32)
Calcium: 9.5 mg/dL (ref 8.4–10.5)
Chloride: 102 mEq/L (ref 96–112)
Creatinine, Ser: 1.23 mg/dL — ABNORMAL HIGH (ref 0.40–1.20)
GFR: 41.06 mL/min — ABNORMAL LOW (ref >60.00)
Glucose, Bld: 50 mg/dL — ABNORMAL LOW (ref 70–99)
Potassium: 4.8 mEq/L (ref 3.5–5.1)
Sodium: 136 mEq/L (ref 135–145)
Total Bilirubin: 0.4 mg/dL (ref 0.2–1.2)
Total Protein: 6.7 g/dL (ref 6.0–8.3)

## 2020-05-07 LAB — CBC WITH DIFFERENTIAL/PLATELET
Basophils Absolute: 0.1 10*3/uL (ref 0.0–0.1)
Basophils Relative: 1.2 % (ref 0.0–3.0)
Eosinophils Absolute: 0.1 10*3/uL (ref 0.0–0.7)
Eosinophils Relative: 1.4 % (ref 0.0–5.0)
HCT: 35.8 % — ABNORMAL LOW (ref 36.0–46.0)
Hemoglobin: 12 g/dL (ref 12.0–15.0)
Lymphocytes Relative: 20.3 % (ref 12.0–46.0)
Lymphs Abs: 2.1 10*3/uL (ref 0.7–4.0)
MCHC: 33.5 g/dL (ref 30.0–36.0)
MCV: 93.8 fl (ref 78.0–100.0)
Monocytes Absolute: 1.1 10*3/uL — ABNORMAL HIGH (ref 0.1–1.0)
Monocytes Relative: 10.3 % (ref 3.0–12.0)
Neutro Abs: 6.9 10*3/uL (ref 1.4–7.7)
Neutrophils Relative %: 66.8 % (ref 43.0–77.0)
Platelets: 276 10*3/uL (ref 150.0–400.0)
RBC: 3.82 Mil/uL — ABNORMAL LOW (ref 3.87–5.11)
RDW: 13 % (ref 11.5–15.5)
WBC: 10.3 10*3/uL (ref 4.0–10.5)

## 2020-05-07 LAB — IRON,TIBC AND FERRITIN PANEL
%SAT: 29 % (calc) (ref 16–45)
Ferritin: 38 ng/mL (ref 16–288)
Iron: 89 ug/dL (ref 45–160)
TIBC: 306 mcg/dL (calc) (ref 250–450)

## 2020-05-07 LAB — TSH: TSH: 1.54 u[IU]/mL (ref 0.35–4.50)

## 2020-05-07 MED ORDER — CITALOPRAM HYDROBROMIDE 20 MG PO TABS: 20.0000 mg | ORAL_TABLET | Freq: Every day | ORAL | 0 refills | Status: DC

## 2020-05-07 MED ORDER — OXYBUTYNIN CHLORIDE ER 5 MG PO TB24: 5.0000 mg | ORAL_TABLET | Freq: Every day | ORAL | 0 refills | Status: DC

## 2020-05-07 NOTE — Progress Notes (Signed)
OFFICE VISIT  05/07/2020   CC:  Chief Complaint  Patient presents with  . Follow-up    RCI, pt is not fasting   HPI:    Patient is a 86 y.o. Caucasian female who presents for 3 mo f/u CRI III, chronic DOE, HTN, HLD, and incidentally noted new R axillary LAD on the side where she had recently received her covid 19 injection---dec size on f/u imaging and is due now for rpt u/s to document status (ordered already). Of note, she is followed by Dr. Dwyane Dee in endo for her DM and hypothyroidism---most recent visit he added lyrica to try to help with her painful feet PN symptoms.  A/P as of last visit: "1) Right axillary adenopathy of unknown significance.  Has VERY remote hx of breast ca. As per radiologist's recommendation I have ordered a diagnostic ultrasound to further evaluate.  2) DOE: repeat of her chest CT showed no sign of interstitial lung dz. She did have some signs of small airways dz.  Oxygenation normal on RA and with ambulation around our office.  Certainly she is deconditioned and I wonder if some mild COPD could be contributing to her sx's. Also, she has a moderate sized hiatal hernia that could contribute to her sensation of DOE (or at least the SOB when she bends over).  I'll do trial of anoro ellipta 1 p qd---instructed pt to ask her pharmacist to demonstrate technique.    3) Abd wall/ventral hernia: stable over the years per pt, asymptomatic.  Reassured. Obs.  Signs/symptoms to call or return for were reviewed and pt expressed understanding.  4) HTN: The current medical regimen is effective;  continue present plan and medications. Continue amlodipine 5mg  qd and lopressor 25mg  bid"   INTERIM HX: Her history is erratic today, hard to keep on track. Says she has a lot of "bad problems".  Feels like her overall health is bad. All day long has sleepiness, sleep in night is poor due to nocturia and chronic neck pain. Has chronic neck pain, feels scared/unstable in  shower. We did not even get to discuss anything about her breathing/trial of anoro today.  She has chronic nocturia x 3-4 times, daytime not much of a problem. Depression and anxiety, says she has no motivation to do anything but forces herself to clean her home.  Tired/fatigue--chronic.  HTN: no home bp monitoring. Compliant with meds.  Chronic neck pain: DJD/spondylosis.  She declined any further imaging or PT at recent past o/v with  Me.  Taking tylenol prn.  HLD: tolerating statin. I could not get her to give me a very good/detailed description of her diet today. She is relatively inactive.  CRI: she avoids NSIADs. Hard to tell from her description if she drinks adequate amount of fluids  Review of Systems  Constitutional: Positive for fatigue. Negative for appetite change, chills, fever and unexpected weight change.  HENT: Negative for congestion, dental problem, ear pain, mouth sores and sore throat.   Eyes: Negative for discharge, redness and visual disturbance.  Respiratory: Positive for shortness of breath (with walking). Negative for cough, chest tightness and wheezing.   Cardiovascular: Positive for leg swelling (bilat "fluid"). Negative for chest pain and palpitations.  Gastrointestinal: Negative for abdominal pain, blood in stool, diarrhea, nausea and vomiting.  Genitourinary: Positive for frequency (nocturia). Negative for difficulty urinating, dysuria, flank pain, hematuria and urgency.  Musculoskeletal: Positive for arthralgias (multiple sites, no swelling or redness), back pain, gait problem (feels unsteady), neck pain and  neck stiffness. Negative for joint swelling and myalgias.  Skin: Negative for pallor and rash.  Neurological: Negative for dizziness, speech difficulty, weakness and headaches.  Hematological: Negative for adenopathy. Does not bruise/bleed easily.  Psychiatric/Behavioral: Positive for dysphoric mood and sleep disturbance. Negative for confusion,  self-injury and suicidal ideas. The patient is nervous/anxious.     Past Medical History:  Diagnosis Date  . Abnormal EKG    RBBB and LAFB  . Anxiety and depression   . Asthma   . Axillary adenopathy 01/2020   CT 12/2019 incidental finding, pt had recent covid vaccine in ipsilateral arm.  F/u axillary u/s 1 mo later showed dec in size of node.  F/u axillary u/s 3 mo recommended.  . Chronic renal insufficiency, stage 3 (moderate)    GFR @40   . Diabetes mellitus ([place])    managed by endo Dr. Dwyane Dee  . Dizziness   . GERD (gastroesophageal reflux disease)   . Hard of hearing   . Hiatal hernia    Moderate size (contributing to pt's DOE?)  . History of bilateral breast cancer approx age 53   bilat mastectomy then tamoxifen x 10 yrs  . Hyperlipemia   . Hypertension   . IBS (irritable bowel syndrome)   . Mandibular fracture ([place]) 12/04/2015  . MI (myocardial infarction) ([place])   . Osteoarthritis, multiple sites   . Paresthesias    burning, hands and feet.  Gabapentin no help.  Dr. Dwyane Dee started her on lyrica 02/2020.  . Skin cancer of face   . Ventral hernia "many years"   supraumbilical    Past Surgical History:  Procedure Laterality Date  . BREAST SURGERY    . Cardiac event monitor  2016   Normal  . CATARACT EXTRACTION W/ INTRAOCULAR LENS  IMPLANT, BILATERAL Bilateral   . CHOLECYSTECTOMY OPEN  1970's  . MASTECTOMY Bilateral   . PFTs  04/05/2018   Interpretation: The FVC, FEV1, FEV1/FVC ratio and FEF25-75% are within normal limits. Patient effort was erratic, but mild dec in diff capacity noted--mild dec functional alveolar tissue  . TRANSTHORACIC ECHOCARDIOGRAM  11/09/2019   EF 60-65%, grd I DD.    Outpatient Medications Prior to Visit  Medication Sig Dispense Refill  . ACCU-CHEK GUIDE test strip CHECK BLOOD SUGAR THREE TO FOUR TIMES DAILY 400 strip 2  . Accu-Chek Softclix Lancets lancets Use as instructed to check blood sugar 3-4 times daily. 400 each 2  . Alcohol Swabs (B-D  SINGLE USE SWABS REGULAR) PADS USE TO CLEAN FINGER BEFORE CHECKING BLOOD SUGARS AND CLEAN SKIN BEFORE INSULIN INJECTIONS. 300 each 3  . amLODipine (NORVASC) 5 MG tablet Take 1 tablet (5 mg total) by mouth daily. 90 tablet 3  . aspirin EC 81 MG tablet Take 81 mg by mouth daily.    Marland Kitchen azelastine (ASTELIN) 0.1 % nasal spray Place 1 spray into both nostrils 2 (two) times daily. Use in each nostril as directed 30 mL 12  . BD PEN NEEDLE NANO U/F 32G X 4 MM MISC USE TO INJECT INSULINS 4 TIMES DAILY. 200 each 11  . citalopram (CELEXA) 10 MG tablet Take 1 tablet (10 mg total) by mouth daily. 90 tablet 1  . diclofenac Sodium (VOLTAREN) 1 % GEL Apply 4 g topically 4 (four) times daily. 100 g 0  . fluticasone (FLONASE) 50 MCG/ACT nasal spray Place 2 sprays into both nostrils daily. 16 g 0  . insulin aspart (NOVOLOG FLEXPEN) 100 UNIT/ML FlexPen Inject into the skin in the morning and  at bedtime. Inject 6 units under the skin before breakfast and 8 units before supper.    . levocetirizine (XYZAL) 5 MG tablet Take 0.5 tablets (2.5 mg total) by mouth every evening. 45 tablet 3  . LORazepam (ATIVAN) 0.5 MG tablet TAKE 1 TABLET AT BEDTIME 90 tablet 1  . lovastatin (MEVACOR) 20 MG tablet Take 1 tablet (20 mg total) by mouth at bedtime. 90 tablet 3  . metoprolol tartrate (LOPRESSOR) 25 MG tablet Take 0.5 tablets (12.5 mg total) by mouth 2 (two) times daily. TAKE 1 TABLET BY MOUTH TWICE DAILY. 180 tablet 3  . montelukast (SINGULAIR) 10 MG tablet Take 1 tablet (10 mg total) by mouth at bedtime. 90 tablet 3  . Multiple Vitamins-Minerals (CENTRUM SILVER 50+WOMEN) TABS Take 1 tablet by mouth daily.    . Olopatadine HCl (PATADAY) 0.2 % SOLN Apply 1 drop to each eye once daily. 2.5 mL 0  . omeprazole (PRILOSEC) 40 MG capsule TAKE 1 CAPSULE (40 MG TOTAL) BY MOUTH DAILY. 90 capsule 0  . pregabalin (LYRICA) 25 MG capsule Take 1 capsule (25 mg total) by mouth daily after supper. 30 capsule 1  . Tiotropium Bromide-Olodaterol  (STIOLTO RESPIMAT) 2.5-2.5 MCG/ACT AERS Inhale 2 puffs into the lungs daily. 4 g 1  . traMADol (ULTRAM) 50 MG tablet     . TRESIBA FLEXTOUCH 100 UNIT/ML SOPN FlexTouch Pen INJECT 20 UNITS SUBCUTANEOUSLY ONE TIME DAILY 30 mL 3  . acetaminophen (TYLENOL) 325 MG tablet Take 2 tablets (650 mg total) by mouth every 6 (six) hours as needed for moderate pain or headache. (Patient not taking: Reported on 05/07/2020)    . ofloxacin (FLOXIN) 0.3 % OTIC solution INSTILL 5 DROPS IN EACH EAR ONCE A DAY. (Patient not taking: Reported on 05/07/2020) 5 mL 0  . TRUEplus Lancets 28G MISC CHECK BLOOD SUGAR 3 TO 4 TIMES DAILY. (Patient not taking: Reported on 05/07/2020) 200 each 0   No facility-administered medications prior to visit.    No Known Allergies  ROS As per HPI  PE: Blood pressure 123/68, pulse 69, temperature 97.9 F (36.6 C), temperature source Temporal, resp. rate 16, height 4\' 11"  (1.499 m), weight 123 lb 9.6 oz (56.1 kg), SpO2 97 %. Body mass index is 24.96 kg/m. Exam chaperoned by Deveron Furlong, CMA.  Gen: Alert, well appearing.  Patient is oriented to person, place, time, and situation. AFFECT: pleasant but frustrated, lucid thought and speech. CV: RRR, no m/r/g.   LUNGS: CTA bilat, nonlabored resps, good aeration in all lung fields. Axillae: no masses ABD: soft, NT/ND EXT: no clubbing or cyanosis.  2+ pitting bilat LL edema.   LABS:  Lab Results  Component Value Date   TSH 1.56 10/10/2019   Lab Results  Component Value Date   WBC 8.6 01/16/2020   HGB 11.8 (L) 01/16/2020   HCT 34.5 (L) 01/16/2020   MCV 94.8 01/16/2020   PLT 282.0 01/16/2020   Lab Results  Component Value Date   IRON 62 11/14/2015   TIBC 274 11/14/2015   FERRITIN 19 11/14/2015   Lab Results  Component Value Date   VITAMINB12 616 05/23/2019    Lab Results  Component Value Date   CREATININE 1.34 (H) 01/16/2020   BUN 29 (H) 01/16/2020   NA 133 (L) 01/16/2020   K 4.8 01/16/2020   CL 100 01/16/2020    CO2 28 01/16/2020   Lab Results  Component Value Date   ALT 16 10/10/2019   AST 23 10/10/2019   ALKPHOS  58 10/10/2019   BILITOT 0.4 10/10/2019   Lab Results  Component Value Date   CHOL 158 08/24/2019   Lab Results  Component Value Date   HDL 50.50 08/24/2019   Lab Results  Component Value Date   LDLCALC 77 08/24/2019   Lab Results  Component Value Date   TRIG 153.0 (H) 08/24/2019   Lab Results  Component Value Date   CHOLHDL 3 08/24/2019   Lab Results  Component Value Date   HGBA1C 6.9 (A) 03/05/2020   IMPRESSION AND PLAN:  1) MDD, recurrent, mild episode: I think this is contributing significantly to her physical symptoms as well.Increase citalopram 20 mg.  Therapeutic expectations and side effect profile of medication discussed today.  Patient's questions answered. TSH check today.  2) HTN: BP good here today.  No home monitoring being done. Continue amlodipine and metoprolol.  Lytes/cr today.  3) OAB: start ditropan xl 5mg  qd trial.  4) CRI: avoids oral NSAIDs.  Hydration habits questionable. Lytes/cr today.    5) HLD: diet likely poor/inadequate.  Tolerating statin.  LDL 77 ten mo ago.  Hepatic panel today.  Rpt FLP 3 mo or so.  6) Chronic fatigue: multifactorial (deconditioning, ?mild COPD, depression, polypharmacy, poor sleep from nocturia).  Check CBC, iron panel, and TSH today.  7) Chronic venous insufficiency: discussed low Na diet, elevation of legs, compression stockings.  No diuretics at this time.  An After Visit Summary was printed and given to the patient.  FOLLOW UP: 4 wks f/u OAB/nocturia + depression  Signed:  Crissie Sickles, MD           05/07/2020

## 2020-05-09 ENCOUNTER — Ambulatory Visit (INDEPENDENT_AMBULATORY_CARE_PROVIDER_SITE_OTHER): Payer: Medicare HMO | Admitting: Ophthalmology

## 2020-05-09 ENCOUNTER — Other Ambulatory Visit: Payer: Self-pay

## 2020-05-09 ENCOUNTER — Encounter (INDEPENDENT_AMBULATORY_CARE_PROVIDER_SITE_OTHER): Payer: Self-pay | Admitting: Ophthalmology

## 2020-05-09 DIAGNOSIS — H353211 Exudative age-related macular degeneration, right eye, with active choroidal neovascularization: Secondary | ICD-10-CM

## 2020-05-09 DIAGNOSIS — Z1159 Encounter for screening for other viral diseases: Secondary | ICD-10-CM | POA: Diagnosis not present

## 2020-05-09 DIAGNOSIS — Z20828 Contact with and (suspected) exposure to other viral communicable diseases: Secondary | ICD-10-CM | POA: Diagnosis not present

## 2020-05-09 DIAGNOSIS — M6281 Muscle weakness (generalized): Secondary | ICD-10-CM | POA: Diagnosis not present

## 2020-05-09 DIAGNOSIS — R41841 Cognitive communication deficit: Secondary | ICD-10-CM | POA: Diagnosis not present

## 2020-05-09 DIAGNOSIS — Z9181 History of falling: Secondary | ICD-10-CM | POA: Diagnosis not present

## 2020-05-09 MED ORDER — BEVACIZUMAB CHEMO INJECTION 1.25MG/0.05ML SYRINGE FOR KALEIDOSCOPE
1.2500 mg | INTRAVITREAL | Status: AC | PRN
  Administered 2020-05-09: 1.25 mg via INTRAVITREAL

## 2020-05-09 NOTE — Progress Notes (Signed)
05/09/2020     CHIEF COMPLAINT Patient presents for Retina Follow Up   HISTORY OF PRESENT ILLNESS: Vanessa Gibson is a 86 y.o. female who presents to the clinic today for:   HPI    Retina Follow Up    Patient presents with  Wet AMD.  In right eye.  Severity is moderate.  Duration of 5 weeks.  Since onset it is stable.  I, the attending physician,  performed the HPI with the patient and updated documentation appropriately.          Comments    5 Week AMD f\u OD. Possible Avastin OD. OCT  Pt states vision has slightly improved. Pt states she can now read. BGL: 150       Last edited by Tilda Franco on 05/09/2020 10:46 AM. (History)      Referring physician: Tammi Sou, MD [address] [place],  [place] [address]  HISTORICAL INFORMATION:   Selected notes from the MEDICAL RECORD NUMBER    Lab Results  Component Value Date   HGBA1C 6.9 (A) 03/05/2020     CURRENT MEDICATIONS: Current Outpatient Medications (Ophthalmic Drugs)  Medication Sig  . Olopatadine HCl (PATADAY) 0.2 % SOLN Apply 1 drop to each eye once daily.   No current facility-administered medications for this visit. (Ophthalmic Drugs)   Current Outpatient Medications (Other)  Medication Sig  . ACCU-CHEK GUIDE test strip CHECK BLOOD SUGAR THREE TO FOUR TIMES DAILY  . Accu-Chek Softclix Lancets lancets Use as instructed to check blood sugar 3-4 times daily.  Marland Kitchen acetaminophen (TYLENOL) 325 MG tablet Take 2 tablets (650 mg total) by mouth every 6 (six) hours as needed for moderate pain or headache. (Patient not taking: Reported on 05/07/2020)  . Alcohol Swabs (B-D SINGLE USE SWABS REGULAR) PADS USE TO CLEAN FINGER BEFORE CHECKING BLOOD SUGARS AND CLEAN SKIN BEFORE INSULIN INJECTIONS.  Marland Kitchen amLODipine (NORVASC) 5 MG tablet Take 1 tablet (5 mg total) by mouth daily.  Marland Kitchen aspirin EC 81 MG tablet Take 81 mg by mouth daily.  Marland Kitchen azelastine (ASTELIN) 0.1 % nasal spray Place 1 spray into both nostrils 2 (two)  times daily. Use in each nostril as directed  . BD PEN NEEDLE NANO U/F 32G X 4 MM MISC USE TO INJECT INSULINS 4 TIMES DAILY.  . citalopram (CELEXA) 20 MG tablet Take 1 tablet (20 mg total) by mouth daily.  . diclofenac Sodium (VOLTAREN) 1 % GEL Apply 4 g topically 4 (four) times daily.  . fluticasone (FLONASE) 50 MCG/ACT nasal spray Place 2 sprays into both nostrils daily.  . insulin aspart (NOVOLOG FLEXPEN) 100 UNIT/ML FlexPen Inject into the skin in the morning and at bedtime. Inject 6 units under the skin before breakfast and 8 units before supper.  . levocetirizine (XYZAL) 5 MG tablet Take 0.5 tablets (2.5 mg total) by mouth every evening.  Marland Kitchen LORazepam (ATIVAN) 0.5 MG tablet TAKE 1 TABLET AT BEDTIME  . lovastatin (MEVACOR) 20 MG tablet Take 1 tablet (20 mg total) by mouth at bedtime.  . metoprolol tartrate (LOPRESSOR) 25 MG tablet Take 0.5 tablets (12.5 mg total) by mouth 2 (two) times daily. TAKE 1 TABLET BY MOUTH TWICE DAILY.  . montelukast (SINGULAIR) 10 MG tablet Take 1 tablet (10 mg total) by mouth at bedtime.  . Multiple Vitamins-Minerals (CENTRUM SILVER 50+WOMEN) TABS Take 1 tablet by mouth daily.  Marland Kitchen ofloxacin (FLOXIN) 0.3 % OTIC solution INSTILL 5 DROPS IN EACH EAR ONCE A DAY. (Patient  not taking: Reported on 05/07/2020)  . omeprazole (PRILOSEC) 40 MG capsule TAKE 1 CAPSULE (40 MG TOTAL) BY MOUTH DAILY.  Marland Kitchen oxybutynin (DITROPAN-XL) 5 MG 24 hr tablet Take 1 tablet (5 mg total) by mouth at bedtime.  . pregabalin (LYRICA) 25 MG capsule Take 1 capsule (25 mg total) by mouth daily after supper.  . Tiotropium Bromide-Olodaterol (STIOLTO RESPIMAT) 2.5-2.5 MCG/ACT AERS Inhale 2 puffs into the lungs daily.  . traMADol (ULTRAM) 50 MG tablet   . TRESIBA FLEXTOUCH 100 UNIT/ML SOPN FlexTouch Pen INJECT 20 UNITS SUBCUTANEOUSLY ONE TIME DAILY   No current facility-administered medications for this visit. (Other)      REVIEW OF SYSTEMS:    ALLERGIES No Known Allergies  PAST MEDICAL  HISTORY Past Medical History:  Diagnosis Date  . Abnormal EKG    RBBB and LAFB  . Anxiety and depression   . Asthma   . Axillary adenopathy 01/2020   CT 12/2019 incidental finding, pt had recent covid vaccine in ipsilateral arm.  F/u axillary u/s 1 mo later showed dec in size of node.  F/u axillary u/s 3 mo recommended.  . Chronic renal insufficiency, stage 3 (moderate)    GFR @40   . Diabetes mellitus ([place])    managed by endo Dr. Dwyane Dee  . Dizziness   . GERD (gastroesophageal reflux disease)   . Hard of hearing   . Hiatal hernia    Moderate size (contributing to pt's DOE?)  . History of bilateral breast cancer approx age 5   bilat mastectomy then tamoxifen x 10 yrs  . Hyperlipemia   . Hypertension   . IBS (irritable bowel syndrome)   . Mandibular fracture ([place]) 12/04/2015  . MI (myocardial infarction) ([place])   . Osteoarthritis, multiple sites   . Paresthesias    burning, hands and feet.  Gabapentin no help.  Dr. Dwyane Dee started her on lyrica 02/2020.  . Skin cancer of face   . Ventral hernia "many years"   supraumbilical   Past Surgical History:  Procedure Laterality Date  . BREAST SURGERY    . Cardiac event monitor  2016   Normal  . CATARACT EXTRACTION W/ INTRAOCULAR LENS  IMPLANT, BILATERAL Bilateral   . CHOLECYSTECTOMY OPEN  1970's  . MASTECTOMY Bilateral   . PFTs  04/05/2018   Interpretation: The FVC, FEV1, FEV1/FVC ratio and FEF25-75% are within normal limits. Patient effort was erratic, but mild dec in diff capacity noted--mild dec functional alveolar tissue  . TRANSTHORACIC ECHOCARDIOGRAM  11/09/2019   EF 60-65%, grd I DD.    FAMILY HISTORY History reviewed. No pertinent family history.  SOCIAL HISTORY Social History   Tobacco Use  . Smoking status: Never Smoker  . Smokeless tobacco: Never Used  Vaping Use  . Vaping Use: Never used  Substance Use Topics  . Alcohol use: No  . Drug use: No         OPHTHALMIC EXAM:  Base Eye Exam    Visual Acuity  (Snellen - Linear)      Right Left   Dist [place] 20/50 -2 20/50 -1   Dist ph [place] NI NI       Tonometry (Tonopen, 10:51 AM)      Right Left   Pressure 18 15       Pupils      Pupils Dark Light Shape React APD   Right PERRL 3 3 Round Minimal None   Left PERRL 3 2.5 Round Sluggish None       Visual  Fields (Counting fingers)      Left Right    Full Full       Neuro/Psych    Oriented x3: Yes   Mood/Affect: Normal       Dilation    Right eye: 1.0% Mydriacyl, 2.5% Phenylephrine @ 10:51 AM        Slit Lamp and Fundus Exam    Slit Lamp Exam      Right Left   Lids/Lashes Normal Normal   Conjunctiva/Sclera White and quiet White and quiet   Cornea Clear Clear   Anterior Chamber Deep and quiet Deep and quiet   Iris Round and reactive Round and reactive   Lens Posterior chamber intraocular lens Posterior chamber intraocular lens   Anterior Vitreous Normal Normal       Fundus Exam      Right Left   Posterior Vitreous Posterior vitreous detachment    Disc Normal    C/D Ratio 0.35    Macula  Less subretinal neovascular membrane, Macular thickening, Retinal pigment epithelial atrophy, Retinal pigment epithelial mottling, Retinal pigment epithelial detachment    Vessels Normal    Periphery Normal           IMAGING AND PROCEDURES  Imaging and Procedures for 05/09/20  OCT, Retina - OU - Both Eyes       Right Eye Quality was good. Scan locations included subfoveal. Central Foveal Thickness: 339. Progression has improved. Findings include subretinal fluid.   Left Eye Quality was good. Scan locations included subfoveal. Central Foveal Thickness: 249. Progression has been stable.   Notes OD with much less subretinal fluid after repeat intravitreal Avastin some 5 weeks ago.  OS also stable on antiveg F therapy in the recent past.       Intravitreal Injection, Pharmacologic Agent - OD - Right Eye       Time Out 05/09/2020. 11:47 AM. Confirmed correct patient,  procedure, site, and patient consented.   Anesthesia Topical anesthesia was used. Anesthetic medications included [place] 3.5%.   Procedure Preparation included 10% betadine to eyelids, Tobramycin 0.3%. A 30 gauge needle was used.   Injection:  1.25 mg Bevacizumab (AVASTIN) SOLN   NDC: [address]-5, Lot: [id number]   Route: Intravitreal, Site: Right Eye, Waste: 0 mg  Post-op Post injection exam found visual acuity of at least counting fingers. The patient tolerated the procedure well. There were no complications. The patient received written and verbal post procedure care education. Post injection medications were not given.                 ASSESSMENT/PLAN:  Exudative age-related macular degeneration of right eye with active choroidal neovascularization (HCC) Currently at 5 weeks status post intravitreal Avastin OD, much less subretinal fluid, much less thickening.  Will repeat intravitreal Avastin OD today      ICD-10-CM   1. Exudative age-related macular degeneration of right eye with active choroidal neovascularization (HCC)  H35.3211 OCT, Retina - OU - Both Eyes    Intravitreal Injection, Pharmacologic Agent - OD - Right Eye    Bevacizumab (AVASTIN) SOLN 1.25 mg    1.  Repeat intravitreal Avastin OD today at 5-week interval, repeat examination OD in 5 weeks  2.  OS follow-up next as scheduled possible intravitreal Avastin  3.  Ophthalmic Meds Ordered this visit:  Meds ordered this encounter  Medications  . Bevacizumab (AVASTIN) SOLN 1.25 mg       Return in about 5 weeks (around 06/13/2020) for AVASTIN OCT, OD, dilate.  There are no Patient Instructions on file for this visit.   Explained the diagnoses, plan, and follow up with the patient and they expressed understanding.  Patient expressed understanding of the importance of proper follow up care.   Clent Demark Glenola Wheat M.D. Diseases & Surgery of the Retina and Vitreous [facility] [place] 05/09/20     Abbreviations: M myopia (nearsighted); A astigmatism; H hyperopia (farsighted); P presbyopia; Mrx spectacle prescription;  CTL contact lenses; OD right eye; OS left eye; OU both eyes  XT exotropia; ET esotropia; PEK punctate epithelial keratitis; PEE punctate epithelial erosions; DES dry eye syndrome; MGD meibomian gland dysfunction; ATs artificial tears; PFAT's preservative free artificial tears; [place] nuclear sclerotic cataract; PSC posterior subcapsular cataract; ERM epi-retinal membrane; PVD posterior vitreous detachment; RD retinal detachment; DM diabetes mellitus; DR diabetic retinopathy; NPDR non-proliferative diabetic retinopathy; PDR proliferative diabetic retinopathy; CSME clinically significant macular edema; DME diabetic macular edema; dbh dot blot hemorrhages; CWS cotton wool spot; POAG primary open angle glaucoma; C/D cup-to-disc ratio; HVF humphrey visual field; GVF goldmann visual field; OCT optical coherence tomography; IOP intraocular pressure; BRVO Branch retinal vein occlusion; CRVO central retinal vein occlusion; CRAO central retinal artery occlusion; BRAO branch retinal artery occlusion; RT retinal tear; SB scleral buckle; PPV pars plana vitrectomy; VH Vitreous hemorrhage; PRP panretinal laser photocoagulation; IVK intravitreal kenalog; VMT vitreomacular traction; MH Macular hole;  NVD neovascularization of the disc; NVE neovascularization elsewhere; AREDS age related eye disease study; ARMD age related macular degeneration; POAG primary open angle glaucoma; EBMD epithelial/anterior basement membrane dystrophy; ACIOL anterior chamber intraocular lens; IOL intraocular lens; PCIOL posterior chamber intraocular lens; Phaco/IOL phacoemulsification with intraocular lens placement; [place] photorefractive keratectomy; LASIK laser assisted in situ keratomileusis; HTN hypertension; DM diabetes mellitus; COPD chronic obstructive pulmonary disease

## 2020-05-09 NOTE — Assessment & Plan Note (Signed)
Currently at 5 weeks status post intravitreal Avastin OD, much less subretinal fluid, much less thickening.  Will repeat intravitreal Avastin OD today

## 2020-05-10 ENCOUNTER — Encounter (INDEPENDENT_AMBULATORY_CARE_PROVIDER_SITE_OTHER): Payer: Medicare HMO | Admitting: Ophthalmology

## 2020-05-11 DIAGNOSIS — M6281 Muscle weakness (generalized): Secondary | ICD-10-CM | POA: Diagnosis not present

## 2020-05-11 DIAGNOSIS — R2681 Unsteadiness on feet: Secondary | ICD-10-CM | POA: Diagnosis not present

## 2020-05-14 DIAGNOSIS — R2681 Unsteadiness on feet: Secondary | ICD-10-CM | POA: Diagnosis not present

## 2020-05-14 DIAGNOSIS — Z9181 History of falling: Secondary | ICD-10-CM | POA: Diagnosis not present

## 2020-05-14 DIAGNOSIS — M6281 Muscle weakness (generalized): Secondary | ICD-10-CM | POA: Diagnosis not present

## 2020-05-14 DIAGNOSIS — R41841 Cognitive communication deficit: Secondary | ICD-10-CM | POA: Diagnosis not present

## 2020-05-15 DIAGNOSIS — R41841 Cognitive communication deficit: Secondary | ICD-10-CM | POA: Diagnosis not present

## 2020-05-15 DIAGNOSIS — M6281 Muscle weakness (generalized): Secondary | ICD-10-CM | POA: Diagnosis not present

## 2020-05-15 DIAGNOSIS — Z9181 History of falling: Secondary | ICD-10-CM | POA: Diagnosis not present

## 2020-05-15 DIAGNOSIS — R2681 Unsteadiness on feet: Secondary | ICD-10-CM | POA: Diagnosis not present

## 2020-05-16 DIAGNOSIS — Z1159 Encounter for screening for other viral diseases: Secondary | ICD-10-CM | POA: Diagnosis not present

## 2020-05-16 DIAGNOSIS — Z20828 Contact with and (suspected) exposure to other viral communicable diseases: Secondary | ICD-10-CM | POA: Diagnosis not present

## 2020-05-17 DIAGNOSIS — M6281 Muscle weakness (generalized): Secondary | ICD-10-CM | POA: Diagnosis not present

## 2020-05-17 DIAGNOSIS — R2681 Unsteadiness on feet: Secondary | ICD-10-CM | POA: Diagnosis not present

## 2020-05-17 DIAGNOSIS — Z9181 History of falling: Secondary | ICD-10-CM | POA: Diagnosis not present

## 2020-05-17 DIAGNOSIS — R41841 Cognitive communication deficit: Secondary | ICD-10-CM | POA: Diagnosis not present

## 2020-05-21 DIAGNOSIS — R41841 Cognitive communication deficit: Secondary | ICD-10-CM | POA: Diagnosis not present

## 2020-05-21 DIAGNOSIS — M6281 Muscle weakness (generalized): Secondary | ICD-10-CM | POA: Diagnosis not present

## 2020-05-21 DIAGNOSIS — Z9181 History of falling: Secondary | ICD-10-CM | POA: Diagnosis not present

## 2020-05-23 DIAGNOSIS — Z20828 Contact with and (suspected) exposure to other viral communicable diseases: Secondary | ICD-10-CM | POA: Diagnosis not present

## 2020-05-23 DIAGNOSIS — R41841 Cognitive communication deficit: Secondary | ICD-10-CM | POA: Diagnosis not present

## 2020-05-23 DIAGNOSIS — Z9181 History of falling: Secondary | ICD-10-CM | POA: Diagnosis not present

## 2020-05-23 DIAGNOSIS — M6281 Muscle weakness (generalized): Secondary | ICD-10-CM | POA: Diagnosis not present

## 2020-05-23 DIAGNOSIS — Z1159 Encounter for screening for other viral diseases: Secondary | ICD-10-CM | POA: Diagnosis not present

## 2020-05-23 DIAGNOSIS — R2681 Unsteadiness on feet: Secondary | ICD-10-CM | POA: Diagnosis not present

## 2020-05-25 ENCOUNTER — Other Ambulatory Visit: Payer: Self-pay | Admitting: Family Medicine

## 2020-05-25 DIAGNOSIS — E1169 Type 2 diabetes mellitus with other specified complication: Secondary | ICD-10-CM

## 2020-05-25 DIAGNOSIS — R41841 Cognitive communication deficit: Secondary | ICD-10-CM | POA: Diagnosis not present

## 2020-05-25 DIAGNOSIS — Z9181 History of falling: Secondary | ICD-10-CM | POA: Diagnosis not present

## 2020-05-25 DIAGNOSIS — M6281 Muscle weakness (generalized): Secondary | ICD-10-CM | POA: Diagnosis not present

## 2020-05-27 ENCOUNTER — Encounter: Payer: Self-pay | Admitting: Family Medicine

## 2020-05-28 ENCOUNTER — Other Ambulatory Visit: Payer: Self-pay | Admitting: Family Medicine

## 2020-05-29 DIAGNOSIS — R41841 Cognitive communication deficit: Secondary | ICD-10-CM | POA: Diagnosis not present

## 2020-05-29 DIAGNOSIS — Z9181 History of falling: Secondary | ICD-10-CM | POA: Diagnosis not present

## 2020-05-29 DIAGNOSIS — M6281 Muscle weakness (generalized): Secondary | ICD-10-CM | POA: Diagnosis not present

## 2020-05-30 ENCOUNTER — Other Ambulatory Visit: Payer: Self-pay | Admitting: Family Medicine

## 2020-05-30 DIAGNOSIS — Z1159 Encounter for screening for other viral diseases: Secondary | ICD-10-CM | POA: Diagnosis not present

## 2020-05-30 DIAGNOSIS — Z20828 Contact with and (suspected) exposure to other viral communicable diseases: Secondary | ICD-10-CM | POA: Diagnosis not present

## 2020-05-30 NOTE — Telephone Encounter (Signed)
RF request from pharmacy for Tramadol. Doesn't appear to have been Rx'd by Dr Anitra Lauth in the past. Last OV 05/07/20 Next OV 06/08/20  Please advise.

## 2020-06-04 ENCOUNTER — Encounter: Payer: Self-pay | Admitting: Endocrinology

## 2020-06-04 ENCOUNTER — Other Ambulatory Visit: Payer: Self-pay

## 2020-06-04 ENCOUNTER — Ambulatory Visit (INDEPENDENT_AMBULATORY_CARE_PROVIDER_SITE_OTHER): Payer: Medicare HMO | Admitting: Endocrinology

## 2020-06-04 ENCOUNTER — Ambulatory Visit: Payer: Medicare HMO | Admitting: Family Medicine

## 2020-06-04 VITALS — BP 120/60 | HR 67 | Ht 59.0 in | Wt 124.8 lb

## 2020-06-04 DIAGNOSIS — E1142 Type 2 diabetes mellitus with diabetic polyneuropathy: Secondary | ICD-10-CM

## 2020-06-04 DIAGNOSIS — Z794 Long term (current) use of insulin: Secondary | ICD-10-CM

## 2020-06-04 DIAGNOSIS — I10 Essential (primary) hypertension: Secondary | ICD-10-CM | POA: Diagnosis not present

## 2020-06-04 DIAGNOSIS — E1165 Type 2 diabetes mellitus with hyperglycemia: Secondary | ICD-10-CM

## 2020-06-04 DIAGNOSIS — N289 Disorder of kidney and ureter, unspecified: Secondary | ICD-10-CM | POA: Diagnosis not present

## 2020-06-04 LAB — POCT GLYCOSYLATED HEMOGLOBIN (HGB A1C): Hemoglobin A1C: 6.4 % — AB (ref 4.0–5.6)

## 2020-06-04 NOTE — Progress Notes (Signed)
Patient ID: Vanessa Gibson, female   DOB: 05/05/1928, 86 y.o.   MRN: [id number]           Reason for Appointment: Follow-up for Type 2 Diabetes   History of Present Illness:          Date of diagnosis of type 2 diabetes mellitus: 1997        Background history:   She had been treated with metformin mostly since her diagnosis At some point was given glipizide/metformin combination, detailed history is not available from PCP Also not clear what level of control she has had until recently  Recent history:   Non-insulin hypoglycemic drugs the patient is taking are: none   INSULIN dose: [name] mostly 20 units at night,  Novolog 6 units at breakfast and 8 at supper  Her A1c is now 6.6, highest 7.6 more recently in 2/20   Current management, blood sugar patterns and problems identified:   She has been continuing to be checking her blood sugar up to 3 times a day  She says occasionally she has difficulty getting her meter to work and was giving error messages today in the office which was resolved by removing and putting back her batteries  She is not taking any insulin coverage for her lunch but she thinks she is tending to eat small amounts or snacks midday and afternoon which may be causing some higher readings periodically  She is usually trying to take her NovoLog before starting to eat breakfast and dinner  She likes to take [name] after checking her evening sugar after dinner and if blood sugars relatively low she will reduce the dose to 18 units  FASTING blood sugars are excellent and although she thinks she is getting low sugars in the morning she has only rare blood sugars below 100, was 94 today  She is again asking about trying oral medications and stopping insulin  She is not able to do any walking for exercise  She thinks she is not having a good appetite although her weight recently is about the same  Note: Communication is somewhat difficult with patient being  hard of hearing       Side effects from medications have been: ?  Nausea from metformin    Glucose monitoring:  done 1-2 times a day         Glucometer:  Accu-Chek  Blood Glucose readings from meter download   PRE-MEAL Fasting Lunch Dinner Bedtime Overall  Glucose range:  71-165  ?  281   81-281  Mean/median:  121     136   POST-MEAL PC Breakfast PC Lunch PC Dinner  Glucose range:   108-236  60-207  Mean/median:    118   Previously  PRE-MEAL Fasting Lunch  3 PM  8-9 PM Overall  Glucose range:  91-137   83-186  103-235   Mean/median: 131   135 141 136     Self-care: The diet that the patient has been following is: tries to limit sweets  She has her meals with assisted living facility     Typical meal intake: Breakfast is cereal, sometimes bacon.  Lunch: yogurt, soup, cottage cheese or peanut butter with apple, or other light meal usually           Dietician visit, most recent: Never CDE visit: 1/18                Weight history: Previous range 98-140   Wt Readings from Last 3  Encounters:  06/04/20 124 lb 12.8 oz (56.6 kg)  05/07/20 123 lb 9.6 oz (56.1 kg)  03/05/20 126 lb (57.2 kg)    Glycemic control:  A1c in October 2017 = 8.5    Lab Results  Component Value Date   HGBA1C 6.9 (A) 03/05/2020   HGBA1C 6.6 (A) 12/06/2019   HGBA1C 6.2 (A) 08/24/2019   Lab Results  Component Value Date   MICROALBUR <0.7 08/24/2019   LDLCALC 77 08/24/2019   CREATININE 1.23 (H) 05/07/2020   Lab Results  Component Value Date   MICRALBCREAT 3.1 08/24/2019       Allergies as of 06/04/2020   No Known Allergies     Medication List       Accurate as of June 04, 2020  1:10 PM. If you have any questions, ask your nurse or doctor.        Accu-Chek Guide test strip Generic drug: glucose blood CHECK BLOOD SUGAR THREE TO FOUR TIMES DAILY   Accu-Chek Softclix Lancets lancets Use as instructed to check blood sugar 3-4 times daily.   acetaminophen 325 MG  tablet Commonly known as: TYLENOL Take 2 tablets (650 mg total) by mouth every 6 (six) hours as needed for moderate pain or headache.   amLODipine 5 MG tablet Commonly known as: NORVASC Take 1 tablet (5 mg total) by mouth daily.   aspirin EC 81 MG tablet Take 81 mg by mouth daily.   azelastine 0.1 % nasal spray Commonly known as: ASTELIN Place 1 spray into both nostrils 2 (two) times daily. Use in each nostril as directed   B-D SINGLE USE SWABS REGULAR Pads USE TO CLEAN FINGER BEFORE CHECKING BLOOD SUGARS AND CLEAN SKIN BEFORE INSULIN INJECTIONS.   BD Pen Needle Nano U/F 32G X 4 MM Misc Generic drug: Insulin Pen Needle USE TO INJECT INSULINS 4 TIMES DAILY.   Centrum Silver 50+Women Tabs Take 1 tablet by mouth daily.   citalopram 20 MG tablet Commonly known as: CELEXA TAKE 1 TABLET ONCE DAILY.   diclofenac Sodium 1 % Gel Commonly known as: Voltaren Apply 4 g topically 4 (four) times daily.   fluticasone 50 MCG/ACT nasal spray Commonly known as: FLONASE Place 2 sprays into both nostrils daily.   levocetirizine 5 MG tablet Commonly known as: Xyzal Take 0.5 tablets (2.5 mg total) by mouth every evening.   LORazepam 0.5 MG tablet Commonly known as: ATIVAN TAKE 1 TABLET AT BEDTIME   lovastatin 20 MG tablet Commonly known as: MEVACOR TAKE ONE TABLET AT BEDTIME.   metoprolol tartrate 25 MG tablet Commonly known as: LOPRESSOR Take 0.5 tablets (12.5 mg total) by mouth 2 (two) times daily. TAKE 1 TABLET BY MOUTH TWICE DAILY.   montelukast 10 MG tablet Commonly known as: SINGULAIR Take 1 tablet (10 mg total) by mouth at bedtime.   NovoLOG FlexPen 100 UNIT/ML FlexPen Generic drug: insulin aspart Inject into the skin in the morning and at bedtime. Inject 6 units under the skin before breakfast and 8 units before supper.   ofloxacin 0.3 % OTIC solution Commonly known as: FLOXIN INSTILL 5 DROPS IN EACH EAR ONCE A DAY.   Olopatadine HCl 0.2 % Soln Commonly known as:  Pataday Apply 1 drop to each eye once daily.   omeprazole 40 MG capsule Commonly known as: PRILOSEC TAKE 1 CAPSULE (40 MG TOTAL) BY MOUTH DAILY.   oxybutynin 5 MG 24 hr tablet Commonly known as: DITROPAN-XL TAKE ONE TABLET AT BEDTIME.   pregabalin 25 MG capsule Commonly  known as: Lyrica Take 1 capsule (25 mg total) by mouth daily after supper.   Stiolto Respimat 2.5-2.5 MCG/ACT Aers Generic drug: Tiotropium Bromide-Olodaterol Inhale 2 puffs into the lungs daily.   traMADol 50 MG tablet Commonly known as: ULTRAM TAKE (1) TABLET EVERY EIGHT HOURS AS NEEDED FOR PAIN.   Tyler Aas FlexTouch 100 UNIT/ML FlexTouch Pen Generic drug: insulin degludec INJECT 20 UNITS SUBCUTANEOUSLY ONE TIME DAILY       Allergies: No Known Allergies  Past Medical History:  Diagnosis Date  . Abnormal EKG    RBBB and LAFB  . Anxiety and depression   . Axillary adenopathy 01/2020   CT 12/2019 incidental finding, pt had recent covid vaccine in ipsilateral arm.  F/u axillary u/s 1 mo later showed dec in size of node.  F/u axillary u/s 3 mo recommended.  . Chronic renal insufficiency, stage 3 (moderate)    GFR @40   . Chronic venous insufficiency   . Diabetes mellitus ([place])    managed by endo Dr. Dwyane Dee  . Dizziness   . DOE (dyspnea on exertion)    no desat, CT chest ok except mild small airways dz. Hiatal hernia.  DECONDITIONING  . GERD (gastroesophageal reflux disease)   . Hard of hearing   . Hiatal hernia    Moderate size (contributing to pt's DOE?)  . History of bilateral breast cancer approx age 89   bilat mastectomy then tamoxifen x 10 yrs  . Hyperlipemia   . Hypertension   . IBS (irritable bowel syndrome)   . Mandibular fracture ([place]) 12/04/2015  . MI (myocardial infarction) ([place])   . Osteoarthritis, multiple sites   . Paresthesias    burning, hands and feet.  Gabapentin no help.  Dr. Dwyane Dee started her on lyrica 02/2020.  . Skin cancer of face   . Ventral hernia "many years"    supraumbilical    Past Surgical History:  Procedure Laterality Date  . BREAST SURGERY    . Cardiac event monitor  2016   Normal  . CATARACT EXTRACTION W/ INTRAOCULAR LENS  IMPLANT, BILATERAL Bilateral   . CHOLECYSTECTOMY OPEN  1970's  . MASTECTOMY Bilateral   . PFTs  04/05/2018   Interpretation: The FVC, FEV1, FEV1/FVC ratio and FEF25-75% are within normal limits. Patient effort was erratic, but mild dec in diff capacity noted--mild dec functional alveolar tissue  . TRANSTHORACIC ECHOCARDIOGRAM  11/09/2019   EF 60-65%, grd I DD.    No family history on file.  Social History:  reports that she has never smoked. She has never used smokeless tobacco. She reports that she does not drink alcohol and does not use drugs.   Review of Systems   Lipid history:  She is on lovastatin 20 mg from her PCP with labs as follows:    Lab Results  Component Value Date   CHOL 158 08/24/2019   HDL 50.50 08/24/2019   LDLCALC 77 08/24/2019   TRIG 153.0 (H) 08/24/2019   CHOLHDL 3 08/24/2019           Hypertension:  She is Norvasc, also on metoprolol 25 mg, Has history of CAD  Renal function slightly variable:  Lab Results  Component Value Date   CREATININE 1.23 (H) 05/07/2020   CREATININE 1.34 (H) 01/16/2020   CREATININE 1.31 (H) 10/10/2019     Lab Results  Component Value Date   CREATININE 1.23 (H) 05/07/2020   BUN 28 (H) 05/07/2020   NA 136 05/07/2020   K 4.8 05/07/2020   CL  102 05/07/2020   CO2 28 05/07/2020    Last eye exam reported in 1/21 is to get some injections in her eyes again  She has had chronic tingling and discomfort in her hands  She still has multiple aches and pains, has sometimes sharp pains in her legs She does not think a trial of Lyrica 25 mg was helpful  Foot exam was done in 9/20   Physical Examination:  BP 120/60 (BP Location: Left Arm, Patient Position: Sitting, Cuff Size: Normal)   Pulse 67   Ht 4\' 11"  (1.499 m)   Wt 124 lb 12.8 oz (56.6  kg)   LMP  (LMP Unknown)   SpO2 94%   BMI 25.21 kg/m      ASSESSMENT:  Diabetes type 2, nonobese, insulin-requiring  See history of present illness for detailed discussion of current diabetes management, blood sugar patterns and problems identified   She is on basal bolus insulin regimen with failure to respond to oral agents  Her A1c is excellent at 6.4  Although she thinks her sugars are low in the morning they are averaging about 120  RENAL dysfunction: This is mild and recently slightly better  NEUROPATHY with pains and paresthesia in hands and feet: Not benefiting from Lyrica but she does not want to increase the dose or take more medications  Hypertension: Mild and well-controlled without orthostatic symptoms  PLAN:    She will reduce her dose of Tresiba to 18 units every night Cut back NovoLog to 6 units at dinnertime, may need to reduce further if she is having decreased appetite Stay on 6 units at breakfast To call if she is continuing to have any low sugars again Discussed that she does not need to adjust the [name] based on bedtime readings Discussed what her blood sugar targets should be  She will be given a new glucose monitor Encourage her to make sure she has some protein with every meal  Follow-up with her PCP for neuropathy  There are no Patient Instructions on file for this visit.      Elayne Snare 06/04/2020, 1:10 PM   Note: This office note was prepared with Dragon voice recognition system technology. Any transcriptional errors that result from this process are unintentional.

## 2020-06-04 NOTE — Patient Instructions (Addendum)
Tresiba 16 units at nite   May take 6 Novolog at supper if appetite decreased or eating less carbs  Check blood sugars on waking up 7  days a week  Also check blood sugars about 2 hours after meals and do this after different meals by rotation  Recommended blood sugar levels on waking up are 90-130 and about 2 hours after meal is 130-160  Please bring your blood sugar monitor to each visit, thank you

## 2020-06-06 ENCOUNTER — Encounter (INDEPENDENT_AMBULATORY_CARE_PROVIDER_SITE_OTHER): Payer: Self-pay | Admitting: Ophthalmology

## 2020-06-06 ENCOUNTER — Other Ambulatory Visit: Payer: Self-pay

## 2020-06-06 ENCOUNTER — Ambulatory Visit (INDEPENDENT_AMBULATORY_CARE_PROVIDER_SITE_OTHER): Payer: Medicare HMO | Admitting: Ophthalmology

## 2020-06-06 DIAGNOSIS — H353221 Exudative age-related macular degeneration, left eye, with active choroidal neovascularization: Secondary | ICD-10-CM

## 2020-06-06 MED ORDER — BEVACIZUMAB CHEMO INJECTION 1.25MG/0.05ML SYRINGE FOR KALEIDOSCOPE
1.2500 mg | INTRAVITREAL | Status: AC | PRN
  Administered 2020-06-06: 1.25 mg via INTRAVITREAL

## 2020-06-06 NOTE — Progress Notes (Signed)
06/06/2020     CHIEF COMPLAINT Patient presents for Retina Follow Up   HISTORY OF PRESENT ILLNESS: Vanessa Gibson is a 86 y.o. female who presents to the clinic today for:   HPI    Retina Follow Up    Patient presents with  Wet AMD.  In left eye.  This started 9 weeks ago.  Duration of 9 weeks.  Since onset it is stable.          Comments    9 week f/u possible Avastin OS with dilated exam and OCT(MAC) today.  Pt states her vision has improved since last visit.        Last edited by Melburn Popper, COA on 06/06/2020 11:10 AM. (History)      Referring physician: Tammi Sou, MD [address] [place],   [id number]  HISTORICAL INFORMATION:   Selected notes from the MEDICAL RECORD NUMBER    Lab Results  Component Value Date   HGBA1C 6.4 (A) 06/04/2020     CURRENT MEDICATIONS: Current Outpatient Medications (Ophthalmic Drugs)  Medication Sig  . Olopatadine HCl (PATADAY) 0.2 % SOLN Apply 1 drop to each eye once daily.   No current facility-administered medications for this visit. (Ophthalmic Drugs)   Current Outpatient Medications (Other)  Medication Sig  . ACCU-CHEK GUIDE test strip CHECK BLOOD SUGAR THREE TO FOUR TIMES DAILY  . Accu-Chek Softclix Lancets lancets Use as instructed to check blood sugar 3-4 times daily.  Marland Kitchen acetaminophen (TYLENOL) 325 MG tablet Take 2 tablets (650 mg total) by mouth every 6 (six) hours as needed for moderate pain or headache.  . Alcohol Swabs (B-D SINGLE USE SWABS REGULAR) PADS USE TO CLEAN FINGER BEFORE CHECKING BLOOD SUGARS AND CLEAN SKIN BEFORE INSULIN INJECTIONS.  Marland Kitchen amLODipine (NORVASC) 5 MG tablet Take 1 tablet (5 mg total) by mouth daily.  Marland Kitchen aspirin EC 81 MG tablet Take 81 mg by mouth daily.  Marland Kitchen azelastine (ASTELIN) 0.1 % nasal spray Place 1 spray into both nostrils 2 (two) times daily. Use in each nostril as directed  . BD PEN NEEDLE NANO U/F 32G X 4 MM MISC USE TO INJECT INSULINS 4 TIMES DAILY.  . citalopram  (CELEXA) 20 MG tablet TAKE 1 TABLET ONCE DAILY.  Marland Kitchen diclofenac Sodium (VOLTAREN) 1 % GEL Apply 4 g topically 4 (four) times daily.  . fluticasone (FLONASE) 50 MCG/ACT nasal spray Place 2 sprays into both nostrils daily.  . insulin aspart (NOVOLOG FLEXPEN) 100 UNIT/ML FlexPen Inject into the skin in the morning and at bedtime. Inject 6 units under the skin before breakfast and 8 units before supper.  . levocetirizine (XYZAL) 5 MG tablet Take 0.5 tablets (2.5 mg total) by mouth every evening.  Marland Kitchen LORazepam (ATIVAN) 0.5 MG tablet TAKE 1 TABLET AT BEDTIME  . lovastatin (MEVACOR) 20 MG tablet TAKE ONE TABLET AT BEDTIME.  . metoprolol tartrate (LOPRESSOR) 25 MG tablet Take 0.5 tablets (12.5 mg total) by mouth 2 (two) times daily. TAKE 1 TABLET BY MOUTH TWICE DAILY.  . montelukast (SINGULAIR) 10 MG tablet Take 1 tablet (10 mg total) by mouth at bedtime.  . Multiple Vitamins-Minerals (CENTRUM SILVER 50+WOMEN) TABS Take 1 tablet by mouth daily.  Marland Kitchen ofloxacin (FLOXIN) 0.3 % OTIC solution INSTILL 5 DROPS IN EACH EAR ONCE A DAY.  Marland Kitchen omeprazole (PRILOSEC) 40 MG capsule TAKE 1 CAPSULE (40 MG TOTAL) BY MOUTH DAILY.  Marland Kitchen oxybutynin (DITROPAN-XL) 5 MG 24 hr tablet TAKE ONE TABLET AT BEDTIME.  Marland Kitchen  pregabalin (LYRICA) 25 MG capsule Take 1 capsule (25 mg total) by mouth daily after supper.  . Tiotropium Bromide-Olodaterol (STIOLTO RESPIMAT) 2.5-2.5 MCG/ACT AERS Inhale 2 puffs into the lungs daily.  . traMADol (ULTRAM) 50 MG tablet TAKE (1) TABLET EVERY EIGHT HOURS AS NEEDED FOR PAIN.  . TRESIBA FLEXTOUCH 100 UNIT/ML SOPN FlexTouch Pen INJECT 20 UNITS SUBCUTANEOUSLY ONE TIME DAILY   No current facility-administered medications for this visit. (Other)      REVIEW OF SYSTEMS:    ALLERGIES No Known Allergies  PAST MEDICAL HISTORY Past Medical History:  Diagnosis Date  . Abnormal EKG    RBBB and LAFB  . Anxiety and depression   . Axillary adenopathy 01/2020   CT 12/2019 incidental finding, pt had recent covid  vaccine in ipsilateral arm.  F/u axillary u/s 1 mo later showed dec in size of node.  F/u axillary u/s 3 mo recommended.  . Chronic renal insufficiency, stage 3 (moderate)    GFR _0   . Chronic venous insufficiency   . Diabetes mellitus ([place])    managed by endo Dr. Dwyane Dee  . Dizziness   . DOE (dyspnea on exertion)    no desat, CT chest ok except mild small airways dz. Hiatal hernia.  DECONDITIONING  . GERD (gastroesophageal reflux disease)   . Hard of hearing   . Hiatal hernia    Moderate size (contributing to pt's DOE?)  . History of bilateral breast cancer approx age 28   bilat mastectomy then tamoxifen x 10 yrs  . Hyperlipemia   . Hypertension   . IBS (irritable bowel syndrome)   . Mandibular fracture ([place]) 12/04/2015  . MI (myocardial infarction) ([place])   . Osteoarthritis, multiple sites   . Paresthesias    burning, hands and feet.  Gabapentin no help.  Dr. Dwyane Dee started her on lyrica 02/2020.  . Skin cancer of face   . Ventral hernia "many years"   supraumbilical   Past Surgical History:  Procedure Laterality Date  . BREAST SURGERY    . Cardiac event monitor  2016   Normal  . CATARACT EXTRACTION W/ INTRAOCULAR LENS  IMPLANT, BILATERAL Bilateral   . CHOLECYSTECTOMY OPEN  1970's  . MASTECTOMY Bilateral   . PFTs  04/05/2018   Interpretation: The FVC, FEV1, FEV1/FVC ratio and FEF25-75% are within normal limits. Patient effort was erratic, but mild dec in diff capacity noted--mild dec functional alveolar tissue  . TRANSTHORACIC ECHOCARDIOGRAM  11/09/2019   EF 60-65%, grd I DD.    FAMILY HISTORY History reviewed. No pertinent family history.  SOCIAL HISTORY Social History   Tobacco Use  . Smoking status: Never Smoker  . Smokeless tobacco: Never Used  Vaping Use  . Vaping Use: Never used  Substance Use Topics  . Alcohol use: No  . Drug use: No         OPHTHALMIC EXAM:  Base Eye Exam    Visual Acuity (Snellen - Linear)      Right Left   Dist [place] 20/60 20/50    Dist ph [place] NI NI       Tonometry (Tonopen, 11:14 AM)      Right Left   Pressure 19 17       Pupils      Pupils Dark Light Shape React APD   Right PERRL 3 2 Round Minimal None   Left PERRL 3 2 Round Minimal None       Visual Fields (Counting fingers)      Left Right  Full Full       Extraocular Movement      Right Left    Full Full       Neuro/Psych    Oriented x3: Yes   Mood/Affect: Normal       Dilation    Left eye: 1.0% Mydriacyl, 2.5% Phenylephrine @ 11:14 AM        Slit Lamp and Fundus Exam    External Exam      Right Left   External Normal Normal       Slit Lamp Exam      Right Left   Lids/Lashes Normal Normal   Conjunctiva/Sclera White and quiet White and quiet   Cornea Clear Clear   Anterior Chamber Deep and quiet Deep and quiet   Iris Round and reactive Round and reactive   Lens Posterior chamber intraocular lens Posterior chamber intraocular lens   Anterior Vitreous Normal Normal       Fundus Exam      Right Left   Posterior Vitreous  Posterior vitreous detachment   Disc  Normal   C/D Ratio  0.35   Macula  Soft drusen, no macular thickening, no hemorrhage, no exudates   Vessels  Normal   Periphery  Normal          IMAGING AND PROCEDURES  Imaging and Procedures for 06/06/20  OCT, Retina - OU - Both Eyes       Right Eye Quality was good. Scan locations included subfoveal. Central Foveal Thickness: 286.   Left Eye Quality was good. Scan locations included subfoveal. Central Foveal Thickness: 268. Findings include no SRF, intraretinal fluid.   Notes OS, overall improved, watch region superotemporal to the fovea with small amount of intraretinal fluid.  Still stable at 9 weeks.  We will repeat injection today extend exam interval to 10 weeks       Intravitreal Injection, Pharmacologic Agent - OS - Left Eye       Time Out 06/06/2020. 11:47 AM. Confirmed correct patient, procedure, site, and patient consented.    Anesthesia Topical anesthesia was used. Anesthetic medications included [place] 3.5%.   Procedure Preparation included Ofloxacin , 10% betadine to eyelids, 5% betadine to ocular surface. A 30 gauge needle was used.   Injection:  1.25 mg Bevacizumab (AVASTIN) SOLN   NDC: [address]-8, Lot: [id number]   Route: Intravitreal, Site: Left Eye, Waste: 0 mg  Post-op Post injection exam found visual acuity of at least counting fingers. The patient tolerated the procedure well. There were no complications. The patient received written and verbal post procedure care education. Post injection medications were not given.                 ASSESSMENT/PLAN:  No problem-specific Assessment & Plan notes found for this encounter.      ICD-10-CM   1. Exudative age-related macular degeneration of left eye with active choroidal neovascularization (HCC)  H35.3221 OCT, Retina - OU - Both Eyes    Intravitreal Injection, Pharmacologic Agent - OS - Left Eye    Bevacizumab (AVASTIN) SOLN 1.25 mg    1. OS, overall improved, watch region superotemporal to the fovea with small amount of intraretinal fluid.  Still stable at 9 weeks.  We will repeat injection today extend exam interval to 10 weeks  2. Dilate OD next as scheduled  3.  Ophthalmic Meds Ordered this visit:  Meds ordered this encounter  Medications  . Bevacizumab (AVASTIN) SOLN 1.25 mg  Return in about 10 weeks (around 08/15/2020) for dilate, OS, AVASTIN OCT.  There are no Patient Instructions on file for this visit.   Explained the diagnoses, plan, and follow up with the patient and they expressed understanding.  Patient expressed understanding of the importance of proper follow up care.   Clent Demark Jordi Lacko M.D. Diseases & Surgery of the Retina and Vitreous [facility] [place] 06/06/20     Abbreviations: M myopia (nearsighted); A astigmatism; H hyperopia (farsighted); P presbyopia; Mrx spectacle prescription;  CTL  contact lenses; OD right eye; OS left eye; OU both eyes  XT exotropia; ET esotropia; PEK punctate epithelial keratitis; PEE punctate epithelial erosions; DES dry eye syndrome; MGD meibomian gland dysfunction; ATs artificial tears; PFAT's preservative free artificial tears; [place] nuclear sclerotic cataract; PSC posterior subcapsular cataract; ERM epi-retinal membrane; PVD posterior vitreous detachment; RD retinal detachment; DM diabetes mellitus; DR diabetic retinopathy; NPDR non-proliferative diabetic retinopathy; PDR proliferative diabetic retinopathy; CSME clinically significant macular edema; DME diabetic macular edema; dbh dot blot hemorrhages; CWS cotton wool spot; POAG primary open angle glaucoma; C/D cup-to-disc ratio; HVF humphrey visual field; GVF goldmann visual field; OCT optical coherence tomography; IOP intraocular pressure; BRVO Branch retinal vein occlusion; CRVO central retinal vein occlusion; CRAO central retinal artery occlusion; BRAO branch retinal artery occlusion; RT retinal tear; SB scleral buckle; PPV pars plana vitrectomy; VH Vitreous hemorrhage; PRP panretinal laser photocoagulation; IVK intravitreal kenalog; VMT vitreomacular traction; MH Macular hole;  NVD neovascularization of the disc; NVE neovascularization elsewhere; AREDS age related eye disease study; ARMD age related macular degeneration; POAG primary open angle glaucoma; EBMD epithelial/anterior basement membrane dystrophy; ACIOL anterior chamber intraocular lens; IOL intraocular lens; PCIOL posterior chamber intraocular lens; Phaco/IOL phacoemulsification with intraocular lens placement; [place] photorefractive keratectomy; LASIK laser assisted in situ keratomileusis; HTN hypertension; DM diabetes mellitus; COPD chronic obstructive pulmonary disease

## 2020-06-07 ENCOUNTER — Encounter (INDEPENDENT_AMBULATORY_CARE_PROVIDER_SITE_OTHER): Payer: Medicare HMO | Admitting: Ophthalmology

## 2020-06-07 DIAGNOSIS — C4442 Squamous cell carcinoma of skin of scalp and neck: Secondary | ICD-10-CM | POA: Diagnosis not present

## 2020-06-07 DIAGNOSIS — L7682 Other postprocedural complications of skin and subcutaneous tissue: Secondary | ICD-10-CM | POA: Diagnosis not present

## 2020-06-07 DIAGNOSIS — C44329 Squamous cell carcinoma of skin of other parts of face: Secondary | ICD-10-CM | POA: Diagnosis not present

## 2020-06-07 DIAGNOSIS — Z85828 Personal history of other malignant neoplasm of skin: Secondary | ICD-10-CM | POA: Diagnosis not present

## 2020-06-08 ENCOUNTER — Other Ambulatory Visit: Payer: Self-pay

## 2020-06-08 ENCOUNTER — Ambulatory Visit: Payer: Medicare HMO | Admitting: Family Medicine

## 2020-06-08 ENCOUNTER — Telehealth: Payer: Self-pay | Admitting: Endocrinology

## 2020-06-08 DIAGNOSIS — M6281 Muscle weakness (generalized): Secondary | ICD-10-CM | POA: Diagnosis not present

## 2020-06-08 DIAGNOSIS — R2681 Unsteadiness on feet: Secondary | ICD-10-CM | POA: Diagnosis not present

## 2020-06-08 MED ORDER — DICLOFENAC SODIUM 1 % EX GEL: 4.0000 g | Freq: Four times a day (QID) | CUTANEOUS | 3 refills | Status: DC

## 2020-06-08 MED ORDER — ACCU-CHEK GUIDE W/DEVICE KIT: PACK | 0 refills | Status: AC

## 2020-06-08 NOTE — Telephone Encounter (Signed)
Received RF request by fax for diclofenac 1% gel. Not originally prescribed by you.  LOV:05/07/20 Next ov: 06/15/20 Last written:10/14/19  Medication pending, please advise

## 2020-06-08 NOTE — Telephone Encounter (Signed)
Medication Refill Request  . Did you call your pharmacy and request this refill first?yes  . If patient has not contacted pharmacy first, instruct them to do so for future refills.  . Remind them that contacting the pharmacy for their refill is the quickest method to get the refill.  . Refill policy also stated that it will take anywhere between 24-72 hours to receive the refill.    Name of medication? New Blood Sugar Meter and supplies     (as discussed in her last appointment)  Is this a 90 day supply?   Name and location of pharmacy? [facility]

## 2020-06-08 NOTE — Telephone Encounter (Signed)
Med eRx'd

## 2020-06-08 NOTE — Telephone Encounter (Signed)
Rx sent 

## 2020-06-11 ENCOUNTER — Other Ambulatory Visit: Payer: Self-pay

## 2020-06-11 ENCOUNTER — Other Ambulatory Visit: Payer: Medicare HMO

## 2020-06-11 DIAGNOSIS — Z9181 History of falling: Secondary | ICD-10-CM | POA: Diagnosis not present

## 2020-06-11 DIAGNOSIS — M6281 Muscle weakness (generalized): Secondary | ICD-10-CM | POA: Diagnosis not present

## 2020-06-11 DIAGNOSIS — R41841 Cognitive communication deficit: Secondary | ICD-10-CM | POA: Diagnosis not present

## 2020-06-11 DIAGNOSIS — J301 Allergic rhinitis due to pollen: Secondary | ICD-10-CM

## 2020-06-11 MED ORDER — STIOLTO RESPIMAT 2.5-2.5 MCG/ACT IN AERS: 2.0000 | INHALATION_SPRAY | Freq: Every day | RESPIRATORY_TRACT | 1 refills | Status: DC

## 2020-06-11 MED ORDER — FLUTICASONE PROPIONATE 50 MCG/ACT NA SUSP: 2.0000 | Freq: Every day | NASAL | 0 refills | Status: DC

## 2020-06-13 ENCOUNTER — Encounter (INDEPENDENT_AMBULATORY_CARE_PROVIDER_SITE_OTHER): Payer: Medicare HMO | Admitting: Ophthalmology

## 2020-06-13 ENCOUNTER — Telehealth: Payer: Self-pay | Admitting: Family Medicine

## 2020-06-13 DIAGNOSIS — R2681 Unsteadiness on feet: Secondary | ICD-10-CM | POA: Diagnosis not present

## 2020-06-13 DIAGNOSIS — M6281 Muscle weakness (generalized): Secondary | ICD-10-CM | POA: Diagnosis not present

## 2020-06-13 DIAGNOSIS — Z1159 Encounter for screening for other viral diseases: Secondary | ICD-10-CM | POA: Diagnosis not present

## 2020-06-13 DIAGNOSIS — Z20828 Contact with and (suspected) exposure to other viral communicable diseases: Secondary | ICD-10-CM | POA: Diagnosis not present

## 2020-06-13 NOTE — Telephone Encounter (Signed)
Tiotropium Bromide-Olodaterol And  ofloxacin (FLOXIN OTIC) 0.3 % OTIC solution Astelin nasal spray Voltaren and Flonase all  Need to be refilled through [facility].  [facility] states they have sent request that have been denied.  Please call pt if she needs an appointment.

## 2020-06-15 ENCOUNTER — Encounter: Payer: Self-pay | Admitting: Family Medicine

## 2020-06-15 ENCOUNTER — Ambulatory Visit (INDEPENDENT_AMBULATORY_CARE_PROVIDER_SITE_OTHER): Payer: Medicare HMO | Admitting: Family Medicine

## 2020-06-15 ENCOUNTER — Other Ambulatory Visit: Payer: Self-pay

## 2020-06-15 VITALS — BP 143/78 | HR 65 | Temp 97.6°F | Resp 16 | Ht 59.0 in | Wt 126.6 lb

## 2020-06-15 DIAGNOSIS — M6281 Muscle weakness (generalized): Secondary | ICD-10-CM | POA: Diagnosis not present

## 2020-06-15 DIAGNOSIS — R5381 Other malaise: Secondary | ICD-10-CM | POA: Diagnosis not present

## 2020-06-15 DIAGNOSIS — R413 Other amnesia: Secondary | ICD-10-CM

## 2020-06-15 DIAGNOSIS — R41841 Cognitive communication deficit: Secondary | ICD-10-CM | POA: Diagnosis not present

## 2020-06-15 DIAGNOSIS — F419 Anxiety disorder, unspecified: Secondary | ICD-10-CM | POA: Diagnosis not present

## 2020-06-15 DIAGNOSIS — Z9181 History of falling: Secondary | ICD-10-CM

## 2020-06-15 DIAGNOSIS — F329 Major depressive disorder, single episode, unspecified: Secondary | ICD-10-CM

## 2020-06-15 DIAGNOSIS — N3281 Overactive bladder: Secondary | ICD-10-CM | POA: Diagnosis not present

## 2020-06-15 DIAGNOSIS — R2681 Unsteadiness on feet: Secondary | ICD-10-CM

## 2020-06-15 DIAGNOSIS — F32A Depression, unspecified: Secondary | ICD-10-CM

## 2020-06-15 MED ORDER — OXYBUTYNIN CHLORIDE ER 10 MG PO TB24
10.0000 mg | ORAL_TABLET | Freq: Every day | ORAL | 1 refills | Status: DC
Start: 2020-06-15 — End: 2020-08-28

## 2020-06-15 NOTE — Patient Instructions (Signed)
Only take your prescription pain medication (tramadol) if tylenol is not helpful enough.  I increased your ditropan (oxybutynin)--the tab to help with your urinary urgency--to 10mg  today. Take TWO of your current 5mg  tabs every night until they are all gone, then call your pharmacy and request that the rx for ditropan 10mg  be filled. Take ONE of the 10mg  pill from that point onward--as directed on the bottle.

## 2020-06-15 NOTE — Progress Notes (Signed)
OFFICE VISIT  07/30/2020   CC:  Chief Complaint  Patient presents with  . Follow-up    OAB, nocturia, depression   HPI:    Patient is a 86 y.o. Caucasian female who presents for 5 wk f/u "OAB, nocturia, depression". A/P as of last visit: "1) MDD, recurrent, mild episode: I think this is contributing significantly to her physical symptoms as well.Increase citalopram 20 mg.  Therapeutic expectations and side effect profile of medication discussed today.  Patient's questions answered. TSH check today. 2) HTN: BP good here today.  No home monitoring being done. Continue amlodipine and metoprolol.  Lytes/cr today. 3) OAB: start ditropan xl 43m qd trial. 4) CRI: avoids oral NSAIDs.  Hydration habits questionable.Lytes/cr today.   5) HLD: diet likely poor/inadequate.  Tolerating statin.  LDL 77 ten mo ago.  Hepatic panel today.  Rpt FLP 3 mo or so. 6) Chronic fatigue: multifactorial (deconditioning, ?mild COPD, depression, polypharmacy, poor sleep from nocturia).  Check CBC, iron panel, and TSH today. 7) Chronic venous insufficiency: discussed low Na diet, elevation of legs, compression stockings.  No diuretics at this time."  INTERIM HX: Increased citalopram to 2846mqd and started ditropan xl 46m646mast visit. All labs normal except GFR 41, which is stable for her. Dry mouth on ditropan but she found some relief of OAB and wants to try inc in dose to 47m63mhe got 2 skin lesions removed from head last week and has felt HA all over since then, but this is getting better. Goes on to say she has had HA since about 2 mo ago, same area but more focused in R upper parietal area on R.  Says dec balance with walking.  Worsening memory, increased somnolence.  ROS: no fevers, no CP, no SOB, no wheezing, no cough, no dizziness, no rashes, no melena/hematochezia.  No polyuria or polydipsia.  No myalgias or arthralgias.  No focal weakness, paresthesias, or tremors--but generalized weakness and  unsteady gait..  No acute vision or hearing abnormalities. No n/v/d or abd pain.  No palpitations.      Past Medical History:  Diagnosis Date  . Abnormal EKG    RBBB and LAFB  . Anxiety and depression   . Axillary adenopathy 01/2020   CT 12/2019 incidental finding, pt had recent covid vaccine in ipsilateral arm.  F/u axillary u/s 1 mo later showed dec in size of node.  F/u axillary u/s 3 mo recommended.  . Chronic renal insufficiency, stage 3 (moderate)    GFR _0   . Chronic venous insufficiency   . Diabetes mellitus (HCC)[place] managed by endo Dr. KumaDwyane DeeDizziness   . DOE (dyspnea on exertion)    no desat, CT chest ok except mild small airways dz. Hiatal hernia.  DECONDITIONING  . Femoral neck fracture (HCC)[place]/12/2019   Right: ORIF  . GERD (gastroesophageal reflux disease)   . Hard of hearing   . Hiatal hernia    Moderate size (contributing to pt's DOE?)  . History of bilateral breast cancer approx age 56  36ilat mastectomy then tamoxifen x 10 yrs  . Hyperlipemia   . Hypertension   . IBS (irritable bowel syndrome)   . Mandibular fracture (HCC)[place]10/2017  . MI (myocardial infarction) (HCC)[place]. Osteoarthritis, multiple sites   . Paresthesias    burning, hands and feet.  Gabapentin no help.  Dr. KumaDwyane Deerted her on lyrica 02/2020.  . Skin cancer of face   . Ventral  hernia "many years"   supraumbilical    Past Surgical History:  Procedure Laterality Date  . BREAST SURGERY    . Cardiac event monitor  2016   Normal  . CATARACT EXTRACTION W/ INTRAOCULAR LENS  IMPLANT, BILATERAL Bilateral   . CHOLECYSTECTOMY OPEN  1970's  . FEMUR IM NAIL Right 06/26/2020   Procedure: INTERMEDULLARY FEMORAL AFFIXUS NAIL;  Surgeon: Altamese Lakeview, MD;  Location: [facility];  Service: Orthopedics;  Laterality: Right;  . MASTECTOMY Bilateral   . PFTs  04/05/2018   Interpretation: The FVC, FEV1, FEV1/FVC ratio and FEF25-75% are within normal limits. Patient effort was erratic, but mild dec in diff  capacity noted--mild dec functional alveolar tissue  . TRANSTHORACIC ECHOCARDIOGRAM  11/09/2019   EF 60-65%, grd I DD.    Outpatient Medications Prior to Visit  Medication Sig Dispense Refill  . ACCU-CHEK GUIDE test strip CHECK BLOOD SUGAR THREE TO FOUR TIMES DAILY (Patient taking differently: 1 each by Other route in the morning, at noon, in the evening, and at bedtime. ) 400 strip 2  . Accu-Chek Softclix Lancets lancets Use as instructed to check blood sugar 3-4 times daily. (Patient taking differently: 1 each by Other route See admin instructions. Use as instructed to check blood sugar 3-4 times daily.) 400 each 2  . Alcohol Swabs (B-D SINGLE USE SWABS REGULAR) PADS USE TO CLEAN FINGER BEFORE CHECKING BLOOD SUGARS AND CLEAN SKIN BEFORE INSULIN INJECTIONS. (Patient taking differently: 1 each by Other route See admin instructions. Use to clean finger before checking blood sugars and clean skin before insulin injections.) 300 each 3  . amLODipine (NORVASC) 5 MG tablet Take 1 tablet (5 mg total) by mouth daily. 90 tablet 3  . aspirin EC 81 MG tablet Take 81 mg by mouth daily.    . BD PEN NEEDLE NANO U/F 32G X 4 MM MISC USE TO INJECT INSULINS 4 TIMES DAILY. (Patient taking differently: 1 each by Other route in the morning, at noon, in the evening, and at bedtime. ) 200 each 11  . Blood Glucose Monitoring Suppl (ACCU-CHEK GUIDE) w/Device KIT Use Accu Chek Guide meter to check blood sugar 3-4 times daily. (Patient taking differently: 1 each by Other route See admin instructions. Use Accu Chek Guide meter to check blood sugar 3-4 times daily. ) 1 kit 0  . diclofenac Sodium (VOLTAREN) 1 % GEL Apply 4 g topically 4 (four) times daily. 100 g 3  . fluticasone (FLONASE) 50 MCG/ACT nasal spray Place 2 sprays into both nostrils daily. 16 g 0  . insulin aspart (NOVOLOG FLEXPEN) 100 UNIT/ML FlexPen Inject 6 Units into the skin in the morning and at bedtime. Inject 6 units under the skin before breakfast and 8  units before supper.     . levocetirizine (XYZAL) 5 MG tablet Take 0.5 tablets (2.5 mg total) by mouth every evening. 45 tablet 3  . lovastatin (MEVACOR) 20 MG tablet TAKE ONE TABLET AT BEDTIME. (Patient taking differently: Take 20 mg by mouth at bedtime. ) 90 tablet 0  . metoprolol tartrate (LOPRESSOR) 25 MG tablet Take 0.5 tablets (12.5 mg total) by mouth 2 (two) times daily. TAKE 1 TABLET BY MOUTH TWICE DAILY. (Patient taking differently: Take 25 mg by mouth 2 (two) times daily. ) 180 tablet 3  . montelukast (SINGULAIR) 10 MG tablet Take 1 tablet (10 mg total) by mouth at bedtime. 90 tablet 3  . Multiple Vitamins-Minerals (CENTRUM SILVER 50+WOMEN) TABS Take 1 tablet by mouth daily.    Marland Kitchen  mupirocin ointment (BACTROBAN) 2 % Apply 1 application topically 2 (two) times daily.     . Olopatadine HCl (PATADAY) 0.2 % SOLN Apply 1 drop to each eye once daily. 2.5 mL 0  . omeprazole (PRILOSEC) 40 MG capsule TAKE 1 CAPSULE (40 MG TOTAL) BY MOUTH DAILY. 90 capsule 0  . pregabalin (LYRICA) 25 MG capsule Take 1 capsule (25 mg total) by mouth daily after supper. 30 capsule 1  . TRESIBA FLEXTOUCH 100 UNIT/ML SOPN FlexTouch Pen INJECT 20 UNITS SUBCUTANEOUSLY ONE TIME DAILY (Patient taking differently: Inject 16 Units into the skin daily. ) 30 mL 3  . acetaminophen (TYLENOL) 325 MG tablet Take 2 tablets (650 mg total) by mouth every 6 (six) hours as needed for moderate pain or headache.    Marland Kitchen azelastine (ASTELIN) 0.1 % nasal spray Place 1 spray into both nostrils 2 (two) times daily. Use in each nostril as directed (Patient not taking: Reported on 06/25/2020) 30 mL 12  . citalopram (CELEXA) 20 MG tablet TAKE 1 TABLET ONCE DAILY. 30 tablet 0  . LORazepam (ATIVAN) 0.5 MG tablet TAKE 1 TABLET AT BEDTIME (Patient taking differently: Take 0.5 mg by mouth at bedtime. ) 90 tablet 1  . ofloxacin (FLOXIN) 0.3 % OTIC solution INSTILL 5 DROPS IN EACH EAR ONCE A DAY. (Patient taking differently: Place 5 drops into both ears daily.  ) 5 mL 0  . oxybutynin (DITROPAN-XL) 5 MG 24 hr tablet TAKE ONE TABLET AT BEDTIME. 30 tablet 0  . Tiotropium Bromide-Olodaterol (STIOLTO RESPIMAT) 2.5-2.5 MCG/ACT AERS Inhale 2 puffs into the lungs daily. (Patient not taking: Reported on 06/25/2020) 4 g 1  . traMADol (ULTRAM) 50 MG tablet TAKE (1) TABLET EVERY EIGHT HOURS AS NEEDED FOR PAIN. (Patient taking differently: Take 50 mg by mouth daily. ) 90 tablet 0   No facility-administered medications prior to visit.    No Known Allergies  ROS As per HPI  PE: Vitals with BMI 07/06/2020 07/06/2020 07/05/2020  Height - - -  Weight - - -  BMI - - -  Systolic 542 706 237  Diastolic 67 62 67  Pulse 88 80 82  O2 sat 94% on RA today  Gen: Alert, well appearing.  Patient is oriented to person, place, time, and situation. AFFECT: anxious, lucid thought and speech. SEG:BTDV: no injection, icteris, swelling, or exudate.  EOMI, PERRLA. Mouth: lips without lesion/swelling.  Oral mucosa pink and moist. Oropharynx without erythema, exudate, or swelling.  NECK full ROM w/out pain CV: RRR, no m/r/g.   LUNGS: CTA bilat, nonlabored resps, good aeration in all lung fields. Neuro: CN 2-12 intact bilaterally, strength 5/5 in proximal and distal upper extremities and lower extremities bilaterally.  No sensory deficits.  No tremor.  No ataxia.  She does walk slowly with small and mildly unsure steps but no shuffling or postural instability.  Upper extremity and lower extremity DTRs symmetric.  No pronator drift.   LABS:    Chemistry      Component Value Date/Time   NA 128 (L) 07/04/2020 1037   K 4.1 07/04/2020 1037   CL 96 (L) 07/04/2020 1037   CO2 24 07/04/2020 1037   BUN 11 07/04/2020 1037   CREATININE 0.89 07/04/2020 1037      Component Value Date/Time   CALCIUM 8.4 (L) 07/04/2020 1037   ALKPHOS 57 06/25/2020 2036   AST 29 06/25/2020 2036   ALT 19 06/25/2020 2036   BILITOT 0.6 06/25/2020 2036     Lab Results  Component Value Date   HGBA1C  6.4 (A) 06/04/2020   Lab Results  Component Value Date   WBC 12.6 (H) 07/04/2020   HGB 7.4 (L) 07/04/2020   HCT 22.8 (L) 07/04/2020   MCV 94.6 07/04/2020   PLT 398 07/04/2020   Lab Results  Component Value Date   IRON 89 05/07/2020   TIBC 306 05/07/2020   FERRITIN 38 05/07/2020    Lab Results  Component Value Date   TSH 1.54 05/07/2020    IMPRESSION AND PLAN:  1) OAB: improved with trial of 4 mg ditropan xl 65m qd. Increase ditropan xl to 133mqd.  2) Chronic anx/dep: Leave citalopram at 2085md.  3 Unsteady gain, acute on chronic?  Unclear connection to fall approx 2 mo ago. Pt admittedly a difficult historian.  She has gradually progressive debility and memory impairment.  Recommended PT trial for help with gait instability and fall prevention. She will think about it.  She'll consider head CT that I proposed today for further eval for possible subdural hematoma that she may have sustained in her fall and hit head about 2 mo ago. Only take tramadol IF tylenol not helpful enough.  Instructions: Only take your prescription pain medication (tramadol) if tylenol is not helpful enough. I increased your ditropan (oxybutynin)--the tab to help with your urinary urgency--to 68m39mday. Take TWO of your current 5mg 56ms every night until they are all gone, then call your pharmacy and request that the rx for ditropan 68mg 90milled. Take ONE of the 68mg p90mfrom that point onward--as directed on the bottle.  Spent 50 min with pt today reviewing HPI, reviewing relevant past history, doing exam, reviewing and discussing lab and imaging data, and formulating plans.  An After Visit Summary was printed and given to the patient.  FOLLOW UP: Return in about 1 year (around 06/15/2021) for 1 mo f/u urinary issues, memory, HA, etc.  Signed:  Phil McCrissie Sickles        07/30/2020

## 2020-06-19 ENCOUNTER — Other Ambulatory Visit: Payer: Medicare HMO

## 2020-06-20 DIAGNOSIS — R41841 Cognitive communication deficit: Secondary | ICD-10-CM | POA: Diagnosis not present

## 2020-06-20 DIAGNOSIS — Z1159 Encounter for screening for other viral diseases: Secondary | ICD-10-CM | POA: Diagnosis not present

## 2020-06-20 DIAGNOSIS — Z20828 Contact with and (suspected) exposure to other viral communicable diseases: Secondary | ICD-10-CM | POA: Diagnosis not present

## 2020-06-20 DIAGNOSIS — Z9181 History of falling: Secondary | ICD-10-CM | POA: Diagnosis not present

## 2020-06-20 DIAGNOSIS — M6281 Muscle weakness (generalized): Secondary | ICD-10-CM | POA: Diagnosis not present

## 2020-06-21 ENCOUNTER — Other Ambulatory Visit: Payer: Self-pay | Admitting: Family Medicine

## 2020-06-24 DIAGNOSIS — S72001A Fracture of unspecified part of neck of right femur, initial encounter for closed fracture: Secondary | ICD-10-CM

## 2020-06-24 HISTORY — DX: Fracture of unspecified part of neck of right femur, initial encounter for closed fracture: S72.001A

## 2020-06-25 ENCOUNTER — Inpatient Hospital Stay (HOSPITAL_COMMUNITY)
Admission: EM | Admit: 2020-06-25 | Discharge: 2020-07-06 | DRG: 481 | Disposition: A | Discharge status: Skilled Nursing Facility | Payer: Medicare HMO | Source: Skilled Nursing Facility | Attending: Internal Medicine | Admitting: Internal Medicine

## 2020-06-25 ENCOUNTER — Encounter (HOSPITAL_COMMUNITY): Payer: Self-pay

## 2020-06-25 ENCOUNTER — Emergency Department (HOSPITAL_COMMUNITY): Payer: Medicare HMO

## 2020-06-25 ENCOUNTER — Encounter (INDEPENDENT_AMBULATORY_CARE_PROVIDER_SITE_OTHER): Payer: Medicare HMO | Admitting: Ophthalmology

## 2020-06-25 ENCOUNTER — Other Ambulatory Visit: Payer: Medicare HMO

## 2020-06-25 DIAGNOSIS — Z9841 Cataract extraction status, right eye: Secondary | ICD-10-CM

## 2020-06-25 DIAGNOSIS — I2510 Atherosclerotic heart disease of native coronary artery without angina pectoris: Secondary | ICD-10-CM | POA: Diagnosis present

## 2020-06-25 DIAGNOSIS — S72141A Displaced intertrochanteric fracture of right femur, initial encounter for closed fracture: Secondary | ICD-10-CM | POA: Diagnosis not present

## 2020-06-25 DIAGNOSIS — E119 Type 2 diabetes mellitus without complications: Secondary | ICD-10-CM | POA: Diagnosis not present

## 2020-06-25 DIAGNOSIS — W1830XA Fall on same level, unspecified, initial encounter: Secondary | ICD-10-CM | POA: Diagnosis present

## 2020-06-25 DIAGNOSIS — E1165 Type 2 diabetes mellitus with hyperglycemia: Secondary | ICD-10-CM | POA: Diagnosis present

## 2020-06-25 DIAGNOSIS — I959 Hypotension, unspecified: Secondary | ICD-10-CM | POA: Diagnosis not present

## 2020-06-25 DIAGNOSIS — E785 Hyperlipidemia, unspecified: Secondary | ICD-10-CM | POA: Diagnosis present

## 2020-06-25 DIAGNOSIS — Z853 Personal history of malignant neoplasm of breast: Secondary | ICD-10-CM

## 2020-06-25 DIAGNOSIS — K219 Gastro-esophageal reflux disease without esophagitis: Secondary | ICD-10-CM | POA: Diagnosis not present

## 2020-06-25 DIAGNOSIS — S72002A Fracture of unspecified part of neck of left femur, initial encounter for closed fracture: Secondary | ICD-10-CM

## 2020-06-25 DIAGNOSIS — S299XXA Unspecified injury of thorax, initial encounter: Secondary | ICD-10-CM | POA: Diagnosis not present

## 2020-06-25 DIAGNOSIS — Z4789 Encounter for other orthopedic aftercare: Secondary | ICD-10-CM | POA: Diagnosis not present

## 2020-06-25 DIAGNOSIS — Z9049 Acquired absence of other specified parts of digestive tract: Secondary | ICD-10-CM | POA: Diagnosis not present

## 2020-06-25 DIAGNOSIS — S72009A Fracture of unspecified part of neck of unspecified femur, initial encounter for closed fracture: Secondary | ICD-10-CM

## 2020-06-25 DIAGNOSIS — M1990 Unspecified osteoarthritis, unspecified site: Secondary | ICD-10-CM | POA: Diagnosis not present

## 2020-06-25 DIAGNOSIS — Z961 Presence of intraocular lens: Secondary | ICD-10-CM | POA: Diagnosis present

## 2020-06-25 DIAGNOSIS — R0902 Hypoxemia: Secondary | ICD-10-CM | POA: Diagnosis not present

## 2020-06-25 DIAGNOSIS — Z7982 Long term (current) use of aspirin: Secondary | ICD-10-CM

## 2020-06-25 DIAGNOSIS — M2550 Pain in unspecified joint: Secondary | ICD-10-CM | POA: Diagnosis not present

## 2020-06-25 DIAGNOSIS — G4700 Insomnia, unspecified: Secondary | ICD-10-CM | POA: Diagnosis not present

## 2020-06-25 DIAGNOSIS — S72001A Fracture of unspecified part of neck of right femur, initial encounter for closed fracture: Principal | ICD-10-CM | POA: Diagnosis present

## 2020-06-25 DIAGNOSIS — H9190 Unspecified hearing loss, unspecified ear: Secondary | ICD-10-CM | POA: Diagnosis present

## 2020-06-25 DIAGNOSIS — R52 Pain, unspecified: Secondary | ICD-10-CM | POA: Diagnosis not present

## 2020-06-25 DIAGNOSIS — Z85828 Personal history of other malignant neoplasm of skin: Secondary | ICD-10-CM

## 2020-06-25 DIAGNOSIS — I129 Hypertensive chronic kidney disease with stage 1 through stage 4 chronic kidney disease, or unspecified chronic kidney disease: Secondary | ICD-10-CM | POA: Diagnosis not present

## 2020-06-25 DIAGNOSIS — I152 Hypertension secondary to endocrine disorders: Secondary | ICD-10-CM | POA: Diagnosis present

## 2020-06-25 DIAGNOSIS — K5900 Constipation, unspecified: Secondary | ICD-10-CM | POA: Diagnosis not present

## 2020-06-25 DIAGNOSIS — E222 Syndrome of inappropriate secretion of antidiuretic hormone: Secondary | ICD-10-CM | POA: Diagnosis not present

## 2020-06-25 DIAGNOSIS — S7291XD Unspecified fracture of right femur, subsequent encounter for closed fracture with routine healing: Secondary | ICD-10-CM | POA: Diagnosis not present

## 2020-06-25 DIAGNOSIS — W19XXXA Unspecified fall, initial encounter: Secondary | ICD-10-CM | POA: Diagnosis not present

## 2020-06-25 DIAGNOSIS — D62 Acute posthemorrhagic anemia: Secondary | ICD-10-CM | POA: Diagnosis not present

## 2020-06-25 DIAGNOSIS — Z86718 Personal history of other venous thrombosis and embolism: Secondary | ICD-10-CM

## 2020-06-25 DIAGNOSIS — I252 Old myocardial infarction: Secondary | ICD-10-CM

## 2020-06-25 DIAGNOSIS — Z79899 Other long term (current) drug therapy: Secondary | ICD-10-CM | POA: Diagnosis not present

## 2020-06-25 DIAGNOSIS — J449 Chronic obstructive pulmonary disease, unspecified: Secondary | ICD-10-CM | POA: Diagnosis not present

## 2020-06-25 DIAGNOSIS — F329 Major depressive disorder, single episode, unspecified: Secondary | ICD-10-CM | POA: Diagnosis not present

## 2020-06-25 DIAGNOSIS — Y92199 Unspecified place in other specified residential institution as the place of occurrence of the external cause: Secondary | ICD-10-CM

## 2020-06-25 DIAGNOSIS — Z7401 Bed confinement status: Secondary | ICD-10-CM | POA: Diagnosis not present

## 2020-06-25 DIAGNOSIS — Z9013 Acquired absence of bilateral breasts and nipples: Secondary | ICD-10-CM | POA: Diagnosis not present

## 2020-06-25 DIAGNOSIS — D631 Anemia in chronic kidney disease: Secondary | ICD-10-CM | POA: Diagnosis not present

## 2020-06-25 DIAGNOSIS — D3500 Benign neoplasm of unspecified adrenal gland: Secondary | ICD-10-CM | POA: Diagnosis present

## 2020-06-25 DIAGNOSIS — E1169 Type 2 diabetes mellitus with other specified complication: Secondary | ICD-10-CM | POA: Diagnosis present

## 2020-06-25 DIAGNOSIS — E871 Hypo-osmolality and hyponatremia: Secondary | ICD-10-CM | POA: Diagnosis not present

## 2020-06-25 DIAGNOSIS — S72141D Displaced intertrochanteric fracture of right femur, subsequent encounter for closed fracture with routine healing: Secondary | ICD-10-CM | POA: Diagnosis not present

## 2020-06-25 DIAGNOSIS — I10 Essential (primary) hypertension: Secondary | ICD-10-CM

## 2020-06-25 DIAGNOSIS — D72823 Leukemoid reaction: Secondary | ICD-10-CM | POA: Diagnosis present

## 2020-06-25 DIAGNOSIS — T8130XA Disruption of wound, unspecified, initial encounter: Secondary | ICD-10-CM | POA: Diagnosis not present

## 2020-06-25 DIAGNOSIS — E1122 Type 2 diabetes mellitus with diabetic chronic kidney disease: Secondary | ICD-10-CM | POA: Diagnosis present

## 2020-06-25 DIAGNOSIS — S72041A Displaced fracture of base of neck of right femur, initial encounter for closed fracture: Secondary | ICD-10-CM | POA: Diagnosis not present

## 2020-06-25 DIAGNOSIS — Y848 Other medical procedures as the cause of abnormal reaction of the patient, or of later complication, without mention of misadventure at the time of the procedure: Secondary | ICD-10-CM | POA: Diagnosis present

## 2020-06-25 DIAGNOSIS — N1831 Chronic kidney disease, stage 3a: Secondary | ICD-10-CM | POA: Diagnosis present

## 2020-06-25 DIAGNOSIS — Z86711 Personal history of pulmonary embolism: Secondary | ICD-10-CM

## 2020-06-25 DIAGNOSIS — Z794 Long term (current) use of insulin: Secondary | ICD-10-CM | POA: Diagnosis not present

## 2020-06-25 DIAGNOSIS — Z20822 Contact with and (suspected) exposure to covid-19: Secondary | ICD-10-CM | POA: Diagnosis not present

## 2020-06-25 DIAGNOSIS — H353231 Exudative age-related macular degeneration, bilateral, with active choroidal neovascularization: Secondary | ICD-10-CM | POA: Diagnosis not present

## 2020-06-25 DIAGNOSIS — N183 Chronic kidney disease, stage 3 unspecified: Secondary | ICD-10-CM | POA: Diagnosis not present

## 2020-06-25 DIAGNOSIS — Z9842 Cataract extraction status, left eye: Secondary | ICD-10-CM

## 2020-06-25 DIAGNOSIS — I469 Cardiac arrest, cause unspecified: Secondary | ICD-10-CM | POA: Diagnosis not present

## 2020-06-25 DIAGNOSIS — I452 Bifascicular block: Secondary | ICD-10-CM | POA: Diagnosis present

## 2020-06-25 DIAGNOSIS — S1191XA Laceration without foreign body of unspecified part of neck, initial encounter: Secondary | ICD-10-CM | POA: Diagnosis not present

## 2020-06-25 DIAGNOSIS — N390 Urinary tract infection, site not specified: Secondary | ICD-10-CM | POA: Diagnosis present

## 2020-06-25 DIAGNOSIS — E1159 Type 2 diabetes mellitus with other circulatory complications: Secondary | ICD-10-CM | POA: Diagnosis not present

## 2020-06-25 DIAGNOSIS — E1140 Type 2 diabetes mellitus with diabetic neuropathy, unspecified: Secondary | ICD-10-CM | POA: Diagnosis present

## 2020-06-25 DIAGNOSIS — D72829 Elevated white blood cell count, unspecified: Secondary | ICD-10-CM | POA: Diagnosis present

## 2020-06-25 DIAGNOSIS — R42 Dizziness and giddiness: Secondary | ICD-10-CM | POA: Diagnosis present

## 2020-06-25 DIAGNOSIS — R58 Hemorrhage, not elsewhere classified: Secondary | ICD-10-CM | POA: Diagnosis not present

## 2020-06-25 DIAGNOSIS — Z419 Encounter for procedure for purposes other than remedying health state, unspecified: Secondary | ICD-10-CM

## 2020-06-25 DIAGNOSIS — S72002D Fracture of unspecified part of neck of left femur, subsequent encounter for closed fracture with routine healing: Secondary | ICD-10-CM | POA: Diagnosis not present

## 2020-06-25 DIAGNOSIS — D649 Anemia, unspecified: Secondary | ICD-10-CM | POA: Diagnosis not present

## 2020-06-25 DIAGNOSIS — E559 Vitamin D deficiency, unspecified: Secondary | ICD-10-CM | POA: Diagnosis not present

## 2020-06-25 DIAGNOSIS — Z8673 Personal history of transient ischemic attack (TIA), and cerebral infarction without residual deficits: Secondary | ICD-10-CM

## 2020-06-25 HISTORY — DX: Fracture of unspecified part of neck of unspecified femur, initial encounter for closed fracture: S72.009A

## 2020-06-25 LAB — CBC WITH DIFFERENTIAL/PLATELET
Abs Immature Granulocytes: 0.1 10*3/uL — ABNORMAL HIGH (ref 0.00–0.07)
Basophils Absolute: 0.1 10*3/uL (ref 0.0–0.1)
Basophils Relative: 1 %
Eosinophils Absolute: 0.3 10*3/uL (ref 0.0–0.5)
Eosinophils Relative: 3 %
HCT: 35.4 % — ABNORMAL LOW (ref 36.0–46.0)
Hemoglobin: 11.7 g/dL — ABNORMAL LOW (ref 12.0–15.0)
Immature Granulocytes: 1 %
Lymphocytes Relative: 15 %
Lymphs Abs: 1.5 10*3/uL (ref 0.7–4.0)
MCH: 31.5 pg (ref 26.0–34.0)
MCHC: 33.1 g/dL (ref 30.0–36.0)
MCV: 95.4 fL (ref 80.0–100.0)
Monocytes Absolute: 0.7 10*3/uL (ref 0.1–1.0)
Monocytes Relative: 7 %
Neutro Abs: 7.6 10*3/uL (ref 1.7–7.7)
Neutrophils Relative %: 73 %
Platelets: 257 10*3/uL (ref 150–400)
RBC: 3.71 MIL/uL — ABNORMAL LOW (ref 3.87–5.11)
RDW: 12.6 % (ref 11.5–15.5)
WBC: 10.4 10*3/uL (ref 4.0–10.5)
nRBC: 0 % (ref 0.0–0.2)

## 2020-06-25 LAB — COMPREHENSIVE METABOLIC PANEL
ALT: 19 U/L (ref 0–44)
AST: 29 U/L (ref 15–41)
Albumin: 3.5 g/dL (ref 3.5–5.0)
Alkaline Phosphatase: 57 U/L (ref 38–126)
Anion gap: 9 (ref 5–15)
BUN: 21 mg/dL (ref 8–23)
CO2: 24 mmol/L (ref 22–32)
Calcium: 8.8 mg/dL — ABNORMAL LOW (ref 8.9–10.3)
Chloride: 97 mmol/L — ABNORMAL LOW (ref 98–111)
Creatinine, Ser: 1.22 mg/dL — ABNORMAL HIGH (ref 0.44–1.00)
GFR calc Af Amer: 45 mL/min — ABNORMAL LOW (ref >60)
GFR calc non Af Amer: 39 mL/min — ABNORMAL LOW (ref >60)
Glucose, Bld: 259 mg/dL — ABNORMAL HIGH (ref 70–99)
Potassium: 4.5 mmol/L (ref 3.5–5.1)
Sodium: 130 mmol/L — ABNORMAL LOW (ref 135–145)
Total Bilirubin: 0.6 mg/dL (ref 0.3–1.2)
Total Protein: 6.2 g/dL — ABNORMAL LOW (ref 6.5–8.1)

## 2020-06-25 LAB — SARS CORONAVIRUS 2 BY RT PCR (HOSPITAL ORDER, PERFORMED IN ~~LOC~~ HOSPITAL LAB): SARS Coronavirus 2: NEGATIVE

## 2020-06-25 LAB — GLUCOSE, CAPILLARY: Glucose-Capillary: 284 mg/dL — ABNORMAL HIGH (ref 70–99)

## 2020-06-25 LAB — PROTIME-INR
INR: 1.1 (ref 0.8–1.2)
Prothrombin Time: 13.7 seconds (ref 11.4–15.2)

## 2020-06-25 LAB — TYPE AND SCREEN
ABO/RH(D): A POS
Antibody Screen: NEGATIVE

## 2020-06-25 LAB — ABO/RH: ABO/RH(D): A POS

## 2020-06-25 MED ORDER — INSULIN ASPART 100 UNIT/ML ~~LOC~~ SOLN
0.0000 [IU] | SUBCUTANEOUS | Status: DC
  Administered 2020-06-25: 5 [IU] via SUBCUTANEOUS
  Administered 2020-06-26 (×3): 2 [IU] via SUBCUTANEOUS

## 2020-06-25 MED ORDER — IOHEXOL 350 MG/ML SOLN
50.0000 mL | Freq: Once | INTRAVENOUS | Status: AC | PRN
  Administered 2020-06-25: 50 mL via INTRAVENOUS

## 2020-06-25 MED ORDER — LIDOCAINE-EPINEPHRINE 1 %-1:100000 IJ SOLN
20.0000 mL | Freq: Once | INTRAMUSCULAR | Status: AC
  Administered 2020-06-25: 20 mL via INTRADERMAL
  Filled 2020-06-25: qty 1

## 2020-06-25 MED ORDER — MORPHINE SULFATE (PF) 2 MG/ML IV SOLN: 0.5000 mg | INTRAVENOUS | Status: DC | PRN

## 2020-06-25 MED ORDER — HYDROCODONE-ACETAMINOPHEN 5-325 MG PO TABS
1.0000 | ORAL_TABLET | Freq: Four times a day (QID) | ORAL | Status: DC | PRN
  Administered 2020-06-25: 2 via ORAL
  Administered 2020-06-27 – 2020-07-06 (×9): 1 via ORAL
  Filled 2020-06-25 (×3): qty 1
  Filled 2020-06-25: qty 2
  Filled 2020-06-25 (×7): qty 1

## 2020-06-25 NOTE — ED Triage Notes (Signed)
Pt came in [place] from [facility] post unwitnessed fall. Pt  presents with an approx. 1.5in  Left Jaw Lac, and Right Hip Shortening and rotation. Pt denies LOC an NO back or Neck pain. Pt lac has a pressure bandage and wound is still ozzing blood. Pt also presents with pelvic binding by EMS. Pt received 158mcg Fentanyl at 1950.  22GR Hand PIV, 80HR, 130/74, 277CBG

## 2020-06-25 NOTE — H&P (Signed)
History and Physical    Vanessa Gibson RAX:[id number] DOB: January 12, 1928 DOA: 06/25/2020  PCP: Tammi Sou, MD  Patient coming from: ILF  I have personally briefly reviewed patient's old medical records in [place]  Chief Complaint: Fall, hip pain  HPI: Vanessa Gibson is a 86 y.o. female with medical history significant of DM2, HTN, CKD 3a.  At baseline pt is in ILF, ambulates unassisted (without use of walker) per patient.  Pt had skin tumor removal from L neck a couple of weeks ago.  Pt had mechanical fall at home today.  R hip pain, deformity, inability to bear wt post fall.  Also has laceration / opening of the incision to her L neck.   ED Course: R femoral neck fx.  EDP repaired neck lac.   Review of Systems: As per HPI, otherwise all review of systems negative.  Past Medical History:  Diagnosis Date   Abnormal EKG    RBBB and LAFB   Anxiety and depression    Axillary adenopathy 01/2020   CT 12/2019 incidental finding, pt had recent covid vaccine in ipsilateral arm.  F/u axillary u/s 1 mo later showed dec in size of node.  F/u axillary u/s 3 mo recommended.   Chronic renal insufficiency, stage 3 (moderate)    GFR _0    Chronic venous insufficiency    Diabetes mellitus ([place])    managed by endo Dr. Dwyane Dee   Dizziness    DOE (dyspnea on exertion)    no desat, CT chest ok except mild small airways dz. Hiatal hernia.  DECONDITIONING   GERD (gastroesophageal reflux disease)    Hard of hearing    Hiatal hernia    Moderate size (contributing to pt's DOE?)   History of bilateral breast cancer approx age 52   bilat mastectomy then tamoxifen x 10 yrs   Hyperlipemia    Hypertension    IBS (irritable bowel syndrome)    Mandibular fracture ([place]) 12/04/2015   MI (myocardial infarction) ([place])    Osteoarthritis, multiple sites    Paresthesias    burning, hands and feet.  Gabapentin no help.  Dr. Dwyane Dee started her on lyrica 02/2020.   Skin cancer of face      Ventral hernia "many years"   supraumbilical    Past Surgical History:  Procedure Laterality Date   BREAST SURGERY     Cardiac event monitor  2016   Normal   CATARACT EXTRACTION W/ INTRAOCULAR LENS  IMPLANT, BILATERAL Bilateral    CHOLECYSTECTOMY OPEN  1970's   MASTECTOMY Bilateral    PFTs  04/05/2018   Interpretation: The FVC, FEV1, FEV1/FVC ratio and FEF25-75% are within normal limits. Patient effort was erratic, but mild dec in diff capacity noted--mild dec functional alveolar tissue   TRANSTHORACIC ECHOCARDIOGRAM  11/09/2019   EF 60-65%, grd I DD.     reports that she has never smoked. She has never used smokeless tobacco. She reports that she does not drink alcohol and does not use drugs.  No Known Allergies  No family history on file. No sick contacts.  Prior to Admission medications   Medication Sig Start Date End Date Taking? Authorizing Provider  ACCU-CHEK GUIDE test strip CHECK BLOOD SUGAR THREE TO FOUR TIMES DAILY Patient taking differently: 1 each by Other route in the morning, at noon, in the evening, and at bedtime.  09/12/19   Elayne Snare, MD  Accu-Chek Softclix Lancets lancets Use as instructed to check blood sugar 3-4 times daily. Patient taking  differently: 1 each by Other route See admin instructions. Use as instructed to check blood sugar 3-4 times daily. 03/05/20   Elayne Snare, MD  Alcohol Swabs (B-D SINGLE USE SWABS REGULAR) PADS USE TO CLEAN FINGER BEFORE CHECKING BLOOD SUGARS AND CLEAN SKIN BEFORE INSULIN INJECTIONS. Patient taking differently: 1 each by Other route See admin instructions. Use to clean finger before checking blood sugars and clean skin before insulin injections. 05/16/19   Elayne Snare, MD  amLODipine (NORVASC) 5 MG tablet Take 1 tablet (5 mg total) by mouth daily. 02/01/20   McGowen, Adrian Blackwater, MD  aspirin EC 81 MG tablet Take 81 mg by mouth daily.    [provider]  azelastine (ASTELIN) 0.1 % nasal spray Place 1 spray into  both nostrils 2 (two) times daily. Use in each nostril as directed 07/04/19   Briscoe Deutscher, DO  BD PEN NEEDLE NANO U/F 32G X 4 MM MISC USE TO INJECT INSULINS 4 TIMES DAILY. Patient taking differently: 1 each by Other route in the morning, at noon, in the evening, and at bedtime.  09/12/19   Elayne Snare, MD  Blood Glucose Monitoring Suppl (ACCU-CHEK GUIDE) w/Device KIT Use Accu Chek Guide meter to check blood sugar 3-4 times daily. Patient taking differently: 1 each by Other route See admin instructions. Use Accu Chek Guide meter to check blood sugar 3-4 times daily.  06/08/20   Elayne Snare, MD  citalopram (CELEXA) 20 MG tablet TAKE 1 TABLET ONCE DAILY. Patient taking differently: Take 20 mg by mouth daily.  06/21/20   McGowen, Adrian Blackwater, MD  diclofenac Sodium (VOLTAREN) 1 % GEL Apply 4 g topically 4 (four) times daily. 06/08/20   McGowen, Adrian Blackwater, MD  fluticasone (FLONASE) 50 MCG/ACT nasal spray Place 2 sprays into both nostrils daily. 06/11/20   McGowen, Adrian Blackwater, MD  insulin aspart (NOVOLOG FLEXPEN) 100 UNIT/ML FlexPen Inject 6-8 Units into the skin See admin instructions. Inject 6 units under the skin before breakfast and 8 units before supper.     [provider]  levocetirizine (XYZAL) 5 MG tablet Take 0.5 tablets (2.5 mg total) by mouth every evening. 08/18/19   Briscoe Deutscher, DO  LORazepam (ATIVAN) 0.5 MG tablet TAKE 1 TABLET AT BEDTIME Patient taking differently: Take 0.5 mg by mouth at bedtime.  02/15/20   McGowen, Adrian Blackwater, MD  lovastatin (MEVACOR) 20 MG tablet TAKE ONE TABLET AT BEDTIME. Patient taking differently: Take 20 mg by mouth at bedtime.  05/25/20   McGowen, Adrian Blackwater, MD  metoprolol tartrate (LOPRESSOR) 25 MG tablet Take 0.5 tablets (12.5 mg total) by mouth 2 (two) times daily. TAKE 1 TABLET BY MOUTH TWICE DAILY. 12/07/19   Orma Flaming, MD  montelukast (SINGULAIR) 10 MG tablet Take 1 tablet (10 mg total) by mouth at bedtime. 08/18/19   Briscoe Deutscher, DO  Multiple  Vitamins-Minerals (CENTRUM SILVER 50+WOMEN) TABS Take 1 tablet by mouth daily.    [provider]  mupirocin ointment (BACTROBAN) 2 % Apply 1 application topically 2 (two) times daily.  06/07/20   [provider]  ofloxacin (FLOXIN) 0.3 % OTIC solution INSTILL 5 DROPS IN EACH EAR ONCE A DAY. Patient taking differently: Place 5 drops into both ears daily.  01/04/20   Orma Flaming, MD  Olopatadine HCl (PATADAY) 0.2 % SOLN Apply 1 drop to each eye once daily. 05/05/19   Vivi Barrack, MD  omeprazole (PRILOSEC) 40 MG capsule TAKE 1 CAPSULE (40 MG TOTAL) BY MOUTH DAILY. 05/04/20  McGowen, Adrian Blackwater, MD  oxybutynin (DITROPAN XL) 10 MG 24 hr tablet Take 1 tablet (10 mg total) by mouth at bedtime. 06/15/20   McGowen, Adrian Blackwater, MD  pregabalin (LYRICA) 25 MG capsule Take 1 capsule (25 mg total) by mouth daily after supper. 03/05/20   Elayne Snare, MD  Tiotropium Bromide-Olodaterol (STIOLTO RESPIMAT) 2.5-2.5 MCG/ACT AERS Inhale 2 puffs into the lungs daily. 06/11/20   McGowen, Adrian Blackwater, MD  traMADol (ULTRAM) 50 MG tablet TAKE (1) TABLET EVERY EIGHT HOURS AS NEEDED FOR PAIN. Patient taking differently: Take 50 mg by mouth every 8 (eight) hours as needed for moderate pain.  05/30/20   McGowen, Adrian Blackwater, MD  TRESIBA FLEXTOUCH 100 UNIT/ML SOPN FlexTouch Pen INJECT 20 UNITS SUBCUTANEOUSLY ONE TIME DAILY Patient taking differently: Inject 20 Units into the skin daily.  05/16/19   Elayne Snare, MD    Physical Exam: Vitals:   06/25/20 2030 06/25/20 2114 06/25/20 2144 06/25/20 2200  BP: (!) 143/69 (!) 149/69 134/87 (!) 153/79  Pulse: 79 86 85 93  Resp: _0 (!) 29  Temp:      TempSrc:      SpO2: 95% 98% 92% 95%  Weight:      Height:        Constitutional: NAD, calm, comfortable Eyes: PERRL, lids and conjunctivae normal ENMT: Mucous membranes are moist. Posterior pharynx clear of any exudate or lesions.Normal dentition.  Neck: normal, supple, no masses, no thyromegaly Respiratory: clear to  auscultation bilaterally, no wheezing, no crackles. Normal respiratory effort. No accessory muscle use.  Cardiovascular: Regular rate and rhythm, no murmurs / rubs / gallops. No extremity edema. 2+ pedal pulses. No carotid bruits.  Abdomen: no tenderness, no masses palpated. No hepatosplenomegaly. Bowel sounds positive.  Musculoskeletal: Deformity of L hip. Skin: no rashes, lesions, ulcers. No induration Neurologic: CN 2-12 grossly intact. Sensation intact, DTR normal. Strength 5/5 in all 4.  Psychiatric: Normal judgment and insight. Alert and oriented x 3. Normal mood.    Labs on Admission: I have personally reviewed following labs and imaging studies  CBC: Recent Labs  Lab 06/25/20 2036  WBC 10.4  NEUTROABS 7.6  HGB 11.7*  HCT 35.4*  MCV 95.4  PLT 641   Basic Metabolic Panel: Recent Labs  Lab 06/25/20 2036  NA 130*  K 4.5  CL 97*  CO2 24  GLUCOSE 259*  BUN 21  CREATININE 1.22*  CALCIUM 8.8*   GFR: Estimated Creatinine Clearance: 25.4 mL/min (A) (by C-G formula based on SCr of 1.22 mg/dL (H)). Liver Function Tests: Recent Labs  Lab 06/25/20 2036  AST 29  ALT 19  ALKPHOS 57  BILITOT 0.6  PROT 6.2*  ALBUMIN 3.5   No results for input(s): LIPASE, AMYLASE in the last 168 hours. No results for input(s): AMMONIA in the last 168 hours. Coagulation Profile: Recent Labs  Lab 06/25/20 2036  INR 1.1   Cardiac Enzymes: No results for input(s): CKTOTAL, CKMB, CKMBINDEX, TROPONINI in the last 168 hours. BNP (last 3 results) No results for input(s): PROBNP in the last 8760 hours. HbA1C: No results for input(s): HGBA1C in the last 72 hours. CBG: No results for input(s): GLUCAP in the last 168 hours. Lipid Profile: No results for input(s): CHOL, HDL, LDLCALC, TRIG, CHOLHDL, LDLDIRECT in the last 72 hours. Thyroid Function Tests: No results for input(s): TSH, T4TOTAL, FREET4, T3FREE, THYROIDAB in the last 72 hours. Anemia Panel: No results for input(s): VITAMINB12,  FOLATE, FERRITIN, TIBC, IRON, RETICCTPCT in the last  72 hours. Urine analysis:    Component Value Date/Time   COLORURINE YELLOW 06/18/2018 1105   APPEARANCEUR CLEAR 06/18/2018 1105   LABSPEC 1.020 06/18/2018 1105   PHURINE 5.0 06/18/2018 1105   GLUCOSEU NEGATIVE 06/18/2018 1105   HGBUR NEGATIVE 06/18/2018 1105   BILIRUBINUR Negative 08/26/2018 1451   KETONESUR NEGATIVE 06/18/2018 1105   PROTEINUR Negative 08/26/2018 1451   PROTEINUR NEGATIVE 11/12/2015 1603   UROBILINOGEN 0.2 08/26/2018 1451   UROBILINOGEN 0.2 06/18/2018 1105   NITRITE Negative 08/26/2018 1451   NITRITE NEGATIVE 06/18/2018 1105   LEUKOCYTESUR Negative 08/26/2018 1451    Radiological Exams on Admission: DG Chest 1 View  Result Date: 06/25/2020 CLINICAL DATA:  86 year old female with fall and right hip pain. EXAM: CHEST  1 VIEW COMPARISON:  Chest radiograph dated 04/20/2018. FINDINGS: No focal consolidation, pleural effusion, or pneumothorax. There is mild cardiomegaly. Atherosclerotic calcification of the aorta. No acute osseous pathology. IMPRESSION: No acute cardiopulmonary process. Electronically Signed   By: Anner Crete M.D.   On: 06/25/2020 21:07   CT Angio Neck W and/or Wo Contrast  Result Date: 06/25/2020 CLINICAL DATA:  Moderate to severe neck trauma. Laceration to both sides of the neck. Fall. EXAM: CT ANGIOGRAPHY NECK TECHNIQUE: Multidetector CT imaging of the neck was performed using the standard protocol during bolus administration of intravenous contrast. Multiplanar CT image reconstructions and MIPs were obtained to evaluate the vascular anatomy. Carotid stenosis measurements (when applicable) are obtained utilizing NASCET criteria, using the distal internal carotid diameter as the denominator. CONTRAST:  59m OMNIPAQUE IOHEXOL 350 MG/ML SOLN COMPARISON:  None. FINDINGS: Aortic arch: The left common carotid artery and innominate artery share a common origin. Minimal atherosclerotic changes present at the  origin of the left subclavian artery. Right carotid system: Right common carotid artery is within normal limits. Bifurcation is unremarkable. Cervical right ICA is normal. Intracranial atherosclerotic changes are present without significant stenosis. Left carotid system: The left common carotid artery is within normal limits. Bifurcation is unremarkable. Mild tortuosity is present in the cervical left ICA without significant stenosis. Intracranial atherosclerotic changes are present without significant stenosis. Vertebral arteries: Left vertebral artery is the dominant vessel. Both vertebral arteries originate from the subclavian arteries without significant stenosis. No significant stenosis is present in either vertebral artery in the neck. Vertebrobasilar junction is normal. Visualized basilar artery is unremarkable. Skeleton: Multilevel degenerative changes are present cervical spine. Chronic loss of disc height is noted from C2-T1. No focal lytic or blastic lesions are present. Other neck: Left submandibular laceration is noted. Stranding is present within the subcutaneous fat. No deep penetration is evident. Upper chest: Patchy ground-glass attenuation is present at the lung apices bilaterally. Thoracic inlet is normal. IMPRESSION: 1. Left submandibular laceration with stranding within the subcutaneous fat. No deep penetration. 2. No acute vascular injury. 3. No significant vascular disease in the neck. 4. Atherosclerotic changes within the cavernous internal carotid arteries bilaterally without significant stenosis. 5. Multilevel degenerative changes of the cervical spine. 6. Patchy ground-glass attenuation at the lung apices bilaterally. This may represent atelectasis or edema. Infection is not excluded. Electronically Signed   By: CSan MorelleM.D.   On: 06/25/2020 21:42   DG Hip Unilat W or Wo Pelvis 2-3 Views Right  Result Date: 06/25/2020 CLINICAL DATA:  86year old female with fall and right  hip pain. EXAM: DG HIP (WITH OR WITHOUT PELVIS) 2-3V RIGHT COMPARISON:  None. FINDINGS: There is a mildly displaced, comminuted and angulated fracture of the right femoral neck. The  bones are osteopenic. There is no dislocation. The soft tissues are unremarkable. IMPRESSION: Right femoral neck fracture. Electronically Signed   By: Anner Crete M.D.   On: 06/25/2020 21:08    EKG: Independently reviewed.  Assessment/Plan Principal Problem:   Closed displaced fracture of right femoral neck (HCC) Active Problems:   Hypertension associated with diabetes ([place]), on Lisinopril   CKD stage 3 secondary to diabetes ([place]), on Lisinopril   Uncontrolled type 2 diabetes mellitus with hyperglycemia, with long-term current use of insulin (HCC)    1. R femoral neck fx - 1. On OR schedule for 0845 tomorrow AM 2. 2d echo from dec 2020: nl EF, grade 1 DD (normal for age). 1. Has known RBBB and LAFB at baseline on EKG 2. EKG tonight pending 3. GUPTA risk = 1% 4. NPO after MN 5. Hip fx pathway 2. DM2 - 1. Sensitive SSI Q4H while NPO 3. HTN - 1. Cont home BP meds when med rec completed 4. CKD 3 - 1. Chronic and baseline  DVT prophylaxis: SCDs Code Status: Full Family Communication: No family in room Disposition Plan: SNF vs CIR after hip fx repair Consults called: Ortho surgery - EDP spoke with Dr. Percell Miller, looks like Dr. Marcelino Scot doing surgery tomorrow Admission status: Admit to inpatient  Severity of Illness: The appropriate patient status for this patient is INPATIENT. Inpatient status is judged to be reasonable and necessary in order to provide the required intensity of service to ensure the patient's safety. The patient's presenting symptoms, physical exam findings, and initial radiographic and laboratory data in the context of their chronic comorbidities is felt to place them at high risk for further clinical deterioration. Furthermore, it is not anticipated that the patient will be medically  stable for discharge from the hospital within 2 midnights of admission. The following factors support the patient status of inpatient.   IP status for OR repair of hip fx.   * I certify that at the point of admission it is my clinical judgment that the patient will require inpatient hospital care spanning beyond 2 midnights from the point of admission due to high intensity of service, high risk for further deterioration and high frequency of surveillance required.*    Antara Brecheisen M. DO [facility]  How to contact the [facility] Attending or Consulting provider [place] or covering provider during after hours [place], for this patient?  1. Check the care team in [facility] and look for a) attending/consulting [facility] provider listed and b) the [facility] team listed 2. Log into [email or web address]  Amion Physician Scheduling and messaging for groups and whole hospitals  On call and physician scheduling software for group practices, residents, hospitalists and other medical providers for call, clinic, rotation and shift schedules. On[employer] is a hospital-wide system for scheduling doctors and paging doctors on call. EasyPlot is for scientific plotting and data analysis.  [email or web address]  and use [place]'s universal password to access. If you do not have the password, please contact the hospital operator.  3. Locate the [facility] provider you are looking for under [facility] and page to a number that you can be directly reached. 4. If you still have difficulty reaching the provider, please page the [facility] (Director on Call) for the Hospitalists listed on amion for assistance.  06/25/2020, 10:09 PM

## 2020-06-25 NOTE — ED Notes (Signed)
Merrily Pew, son, [phone] would like an update when available

## 2020-06-26 ENCOUNTER — Inpatient Hospital Stay (HOSPITAL_COMMUNITY): Payer: Medicare HMO | Admitting: Anesthesiology

## 2020-06-26 ENCOUNTER — Other Ambulatory Visit: Payer: Self-pay

## 2020-06-26 ENCOUNTER — Encounter (HOSPITAL_COMMUNITY)
Admission: EM | Disposition: A | Discharge status: Skilled Nursing Facility | Payer: Self-pay | Source: Skilled Nursing Facility | Attending: Internal Medicine

## 2020-06-26 ENCOUNTER — Encounter (HOSPITAL_COMMUNITY): Payer: Self-pay | Admitting: Internal Medicine

## 2020-06-26 ENCOUNTER — Inpatient Hospital Stay (HOSPITAL_COMMUNITY): Payer: Medicare HMO

## 2020-06-26 HISTORY — PX: FEMUR IM NAIL: SHX1597

## 2020-06-26 LAB — URINALYSIS, ROUTINE W REFLEX MICROSCOPIC
Bilirubin Urine: NEGATIVE
Glucose, UA: NEGATIVE mg/dL
Ketones, ur: NEGATIVE mg/dL
Nitrite: POSITIVE — AB
Protein, ur: NEGATIVE mg/dL
Specific Gravity, Urine: 1.027 (ref 1.005–1.030)
pH: 5 (ref 5.0–8.0)

## 2020-06-26 LAB — CBC
HCT: 31.8 % — ABNORMAL LOW (ref 36.0–46.0)
Hemoglobin: 10.5 g/dL — ABNORMAL LOW (ref 12.0–15.0)
MCH: 31.3 pg (ref 26.0–34.0)
MCHC: 33 g/dL (ref 30.0–36.0)
MCV: 94.6 fL (ref 80.0–100.0)
Platelets: 231 10*3/uL (ref 150–400)
RBC: 3.36 MIL/uL — ABNORMAL LOW (ref 3.87–5.11)
RDW: 12.5 % (ref 11.5–15.5)
WBC: 14 10*3/uL — ABNORMAL HIGH (ref 4.0–10.5)
nRBC: 0 % (ref 0.0–0.2)

## 2020-06-26 LAB — BASIC METABOLIC PANEL
Anion gap: 11 (ref 5–15)
BUN: 23 mg/dL (ref 8–23)
CO2: 23 mmol/L (ref 22–32)
Calcium: 8.8 mg/dL — ABNORMAL LOW (ref 8.9–10.3)
Chloride: 97 mmol/L — ABNORMAL LOW (ref 98–111)
Creatinine, Ser: 1.16 mg/dL — ABNORMAL HIGH (ref 0.44–1.00)
GFR calc Af Amer: 48 mL/min — ABNORMAL LOW (ref >60)
GFR calc non Af Amer: 42 mL/min — ABNORMAL LOW (ref >60)
Glucose, Bld: 200 mg/dL — ABNORMAL HIGH (ref 70–99)
Potassium: 4.7 mmol/L (ref 3.5–5.1)
Sodium: 131 mmol/L — ABNORMAL LOW (ref 135–145)

## 2020-06-26 LAB — GLUCOSE, CAPILLARY
Glucose-Capillary: 169 mg/dL — ABNORMAL HIGH (ref 70–99)
Glucose-Capillary: 169 mg/dL — ABNORMAL HIGH (ref 70–99)
Glucose-Capillary: 154 mg/dL — ABNORMAL HIGH (ref 70–99)
Glucose-Capillary: 154 mg/dL — ABNORMAL HIGH (ref 70–99)
Glucose-Capillary: 167 mg/dL — ABNORMAL HIGH (ref 70–99)
Glucose-Capillary: 193 mg/dL — ABNORMAL HIGH (ref 70–99)
Glucose-Capillary: 277 mg/dL — ABNORMAL HIGH (ref 70–99)
Glucose-Capillary: 254 mg/dL — ABNORMAL HIGH (ref 70–99)

## 2020-06-26 LAB — SURGICAL PCR SCREEN
MRSA, PCR: NEGATIVE
Staphylococcus aureus: NEGATIVE

## 2020-06-26 SURGERY — INSERTION, INTRAMEDULLARY ROD, FEMUR, RETROGRADE
Anesthesia: General | Site: Hip | Laterality: Right

## 2020-06-26 MED ORDER — CHLORHEXIDINE GLUCONATE 4 % EX LIQD: 60.0000 mL | Freq: Once | CUTANEOUS | Status: DC

## 2020-06-26 MED ORDER — ROCURONIUM BROMIDE 10 MG/ML (PF) SYRINGE
PREFILLED_SYRINGE | INTRAVENOUS | Status: DC | PRN
  Administered 2020-06-26: 60 mg via INTRAVENOUS

## 2020-06-26 MED ORDER — TRAMADOL HCL 50 MG PO TABS
50.0000 mg | ORAL_TABLET | Freq: Every day | ORAL | Status: DC
  Administered 2020-06-26 – 2020-07-05 (×10): 50 mg via ORAL
  Filled 2020-06-26 (×10): qty 1

## 2020-06-26 MED ORDER — SUGAMMADEX SODIUM 200 MG/2ML IV SOLN
INTRAVENOUS | Status: DC | PRN
  Administered 2020-06-26: 200 mg via INTRAVENOUS

## 2020-06-26 MED ORDER — ORAL CARE MOUTH RINSE: 15.0000 mL | Freq: Once | OROMUCOSAL | Status: AC

## 2020-06-26 MED ORDER — ADULT MULTIVITAMIN W/MINERALS CH
1.0000 | ORAL_TABLET | Freq: Every day | ORAL | Status: DC
  Administered 2020-06-27 – 2020-07-06 (×10): 1 via ORAL
  Filled 2020-06-26 (×11): qty 1

## 2020-06-26 MED ORDER — PANTOPRAZOLE SODIUM 40 MG PO TBEC
40.0000 mg | DELAYED_RELEASE_TABLET | Freq: Every day | ORAL | Status: DC
  Administered 2020-06-26 – 2020-07-06 (×11): 40 mg via ORAL
  Filled 2020-06-26 (×11): qty 1

## 2020-06-26 MED ORDER — MONTELUKAST SODIUM 10 MG PO TABS
10.0000 mg | ORAL_TABLET | Freq: Every day | ORAL | Status: DC
  Administered 2020-06-26 – 2020-07-05 (×10): 10 mg via ORAL
  Filled 2020-06-26 (×10): qty 1

## 2020-06-26 MED ORDER — FENTANYL CITRATE (PF) 250 MCG/5ML IJ SOLN
INTRAMUSCULAR | Status: AC
  Filled 2020-06-26: qty 5

## 2020-06-26 MED ORDER — METOPROLOL TARTRATE 25 MG PO TABS
12.5000 mg | ORAL_TABLET | Freq: Two times a day (BID) | ORAL | Status: DC
  Administered 2020-06-26 – 2020-07-06 (×20): 12.5 mg via ORAL
  Filled 2020-06-26 (×20): qty 1

## 2020-06-26 MED ORDER — OLOPATADINE HCL 0.1 % OP SOLN
1.0000 [drp] | Freq: Two times a day (BID) | OPHTHALMIC | Status: DC
  Administered 2020-06-26 – 2020-07-06 (×20): 1 [drp] via OPHTHALMIC
  Filled 2020-06-26: qty 5

## 2020-06-26 MED ORDER — CENTRUM SILVER 50+WOMEN PO TABS: 1.0000 | ORAL_TABLET | Freq: Every day | ORAL | Status: DC

## 2020-06-26 MED ORDER — DOCUSATE SODIUM 100 MG PO CAPS
100.0000 mg | ORAL_CAPSULE | Freq: Two times a day (BID) | ORAL | Status: DC
  Administered 2020-06-26 – 2020-07-06 (×21): 100 mg via ORAL
  Filled 2020-06-26 (×21): qty 1

## 2020-06-26 MED ORDER — CHLORHEXIDINE GLUCONATE 0.12 % MT SOLN
15.0000 mL | Freq: Once | OROMUCOSAL | Status: AC
  Administered 2020-06-26: 15 mL via OROMUCOSAL
  Filled 2020-06-26: qty 15

## 2020-06-26 MED ORDER — ACETAMINOPHEN 500 MG PO TABS
1000.0000 mg | ORAL_TABLET | Freq: Once | ORAL | Status: AC
  Administered 2020-06-26: 1000 mg via ORAL
  Filled 2020-06-26: qty 2

## 2020-06-26 MED ORDER — AMLODIPINE BESYLATE 5 MG PO TABS
5.0000 mg | ORAL_TABLET | Freq: Every day | ORAL | Status: DC
  Administered 2020-06-27 – 2020-07-06 (×10): 5 mg via ORAL
  Filled 2020-06-26 (×10): qty 1

## 2020-06-26 MED ORDER — PHENYLEPHRINE 40 MCG/ML (10ML) SYRINGE FOR IV PUSH (FOR BLOOD PRESSURE SUPPORT): PREFILLED_SYRINGE | INTRAVENOUS | Status: DC | PRN

## 2020-06-26 MED ORDER — LEVOCETIRIZINE DIHYDROCHLORIDE 5 MG PO TABS: 2.5000 mg | ORAL_TABLET | Freq: Every evening | ORAL | Status: DC

## 2020-06-26 MED ORDER — PREGABALIN 25 MG PO CAPS
25.0000 mg | ORAL_CAPSULE | Freq: Every day | ORAL | Status: DC
  Administered 2020-06-26 – 2020-07-05 (×10): 25 mg via ORAL
  Filled 2020-06-26 (×10): qty 1

## 2020-06-26 MED ORDER — LIDOCAINE 2% (20 MG/ML) 5 ML SYRINGE
INTRAMUSCULAR | Status: AC
  Filled 2020-06-26: qty 5

## 2020-06-26 MED ORDER — MUPIROCIN 2 % EX OINT
1.0000 "application " | TOPICAL_OINTMENT | Freq: Two times a day (BID) | CUTANEOUS | Status: DC
  Administered 2020-06-26 – 2020-07-06 (×14): 1 via TOPICAL
  Filled 2020-06-26 (×2): qty 22

## 2020-06-26 MED ORDER — ONDANSETRON HCL 4 MG/2ML IJ SOLN: 4.0000 mg | Freq: Four times a day (QID) | INTRAMUSCULAR | Status: DC | PRN

## 2020-06-26 MED ORDER — METOCLOPRAMIDE HCL 5 MG/ML IJ SOLN: 5.0000 mg | Freq: Three times a day (TID) | INTRAMUSCULAR | Status: DC | PRN

## 2020-06-26 MED ORDER — LACTATED RINGERS IV SOLN: INTRAVENOUS | Status: DC | PRN

## 2020-06-26 MED ORDER — GLUCERNA SHAKE PO LIQD
237.0000 mL | Freq: Two times a day (BID) | ORAL | Status: DC
  Administered 2020-06-27 – 2020-07-03 (×12): 237 mL via ORAL

## 2020-06-26 MED ORDER — LACTATED RINGERS IV SOLN: INTRAVENOUS | Status: DC

## 2020-06-26 MED ORDER — ENOXAPARIN SODIUM 30 MG/0.3ML ~~LOC~~ SOLN
30.0000 mg | SUBCUTANEOUS | Status: DC
  Administered 2020-06-27 – 2020-07-06 (×10): 30 mg via SUBCUTANEOUS
  Filled 2020-06-26 (×10): qty 0.3

## 2020-06-26 MED ORDER — OFLOXACIN 0.3 % OP SOLN: 5.0000 [drp] | Freq: Every day | OPHTHALMIC | Status: DC

## 2020-06-26 MED ORDER — ONDANSETRON HCL 4 MG PO TABS: 4.0000 mg | ORAL_TABLET | Freq: Four times a day (QID) | ORAL | Status: DC | PRN

## 2020-06-26 MED ORDER — PROPOFOL 10 MG/ML IV BOLUS
INTRAVENOUS | Status: DC | PRN
  Administered 2020-06-26: 120 mg via INTRAVENOUS

## 2020-06-26 MED ORDER — FENTANYL CITRATE (PF) 100 MCG/2ML IJ SOLN: 25.0000 ug | INTRAMUSCULAR | Status: DC | PRN

## 2020-06-26 MED ORDER — CEFAZOLIN SODIUM-DEXTROSE 2-4 GM/100ML-% IV SOLN
2.0000 g | INTRAVENOUS | Status: AC
  Administered 2020-06-26: 2 g via INTRAVENOUS
  Filled 2020-06-26: qty 100

## 2020-06-26 MED ORDER — LORAZEPAM 0.5 MG PO TABS
0.5000 mg | ORAL_TABLET | Freq: Every day | ORAL | Status: DC
  Administered 2020-06-26 – 2020-07-05 (×10): 0.5 mg via ORAL
  Filled 2020-06-26 (×10): qty 1

## 2020-06-26 MED ORDER — DEXAMETHASONE SODIUM PHOSPHATE 10 MG/ML IJ SOLN
INTRAMUSCULAR | Status: DC | PRN
  Administered 2020-06-26: 5 mg via INTRAVENOUS

## 2020-06-26 MED ORDER — ONDANSETRON HCL 4 MG/2ML IJ SOLN
INTRAMUSCULAR | Status: AC
  Filled 2020-06-26: qty 2

## 2020-06-26 MED ORDER — CITALOPRAM HYDROBROMIDE 20 MG PO TABS
20.0000 mg | ORAL_TABLET | Freq: Every day | ORAL | Status: DC
  Administered 2020-06-26 – 2020-07-06 (×11): 20 mg via ORAL
  Filled 2020-06-26 (×11): qty 1

## 2020-06-26 MED ORDER — FENTANYL CITRATE (PF) 250 MCG/5ML IJ SOLN
INTRAMUSCULAR | Status: DC | PRN
  Administered 2020-06-26: 100 ug via INTRAVENOUS

## 2020-06-26 MED ORDER — METOPROLOL TARTRATE 12.5 MG HALF TABLET
ORAL_TABLET | ORAL | Status: AC
  Administered 2020-06-26: 25 mg via ORAL
  Filled 2020-06-26: qty 2

## 2020-06-26 MED ORDER — ASPIRIN EC 81 MG PO TBEC
81.0000 mg | DELAYED_RELEASE_TABLET | Freq: Every day | ORAL | Status: DC
  Administered 2020-06-27 – 2020-07-06 (×10): 81 mg via ORAL
  Filled 2020-06-26 (×10): qty 1

## 2020-06-26 MED ORDER — LIDOCAINE 2% (20 MG/ML) 5 ML SYRINGE
INTRAMUSCULAR | Status: DC | PRN
  Administered 2020-06-26: 60 mg via INTRAVENOUS

## 2020-06-26 MED ORDER — DEXAMETHASONE SODIUM PHOSPHATE 10 MG/ML IJ SOLN
INTRAMUSCULAR | Status: AC
  Filled 2020-06-26: qty 1

## 2020-06-26 MED ORDER — PROMETHAZINE HCL 25 MG/ML IJ SOLN: 6.2500 mg | INTRAMUSCULAR | Status: DC | PRN

## 2020-06-26 MED ORDER — PRAVASTATIN SODIUM 10 MG PO TABS
20.0000 mg | ORAL_TABLET | Freq: Every day | ORAL | Status: DC
  Administered 2020-06-26 – 2020-07-05 (×10): 20 mg via ORAL
  Filled 2020-06-26 (×10): qty 2

## 2020-06-26 MED ORDER — CELECOXIB 200 MG PO CAPS
200.0000 mg | ORAL_CAPSULE | Freq: Once | ORAL | Status: AC
  Administered 2020-06-26: 200 mg via ORAL
  Filled 2020-06-26 (×2): qty 1

## 2020-06-26 MED ORDER — PHENYLEPHRINE HCL-NACL 10-0.9 MG/250ML-% IV SOLN
INTRAVENOUS | Status: DC | PRN
  Administered 2020-06-26: 50 ug/min via INTRAVENOUS

## 2020-06-26 MED ORDER — INSULIN ASPART 100 UNIT/ML ~~LOC~~ SOLN
0.0000 [IU] | Freq: Three times a day (TID) | SUBCUTANEOUS | Status: DC
  Administered 2020-06-26: 5 [IU] via SUBCUTANEOUS
  Administered 2020-06-27: 3 [IU] via SUBCUTANEOUS
  Administered 2020-06-27: 2 [IU] via SUBCUTANEOUS
  Administered 2020-06-27 – 2020-06-28 (×2): 3 [IU] via SUBCUTANEOUS
  Administered 2020-06-28 (×2): 5 [IU] via SUBCUTANEOUS
  Administered 2020-06-29 – 2020-06-30 (×6): 3 [IU] via SUBCUTANEOUS
  Administered 2020-07-01: 2 [IU] via SUBCUTANEOUS
  Administered 2020-07-01 – 2020-07-02 (×3): 3 [IU] via SUBCUTANEOUS
  Administered 2020-07-02: 5 [IU] via SUBCUTANEOUS
  Administered 2020-07-02 – 2020-07-03 (×4): 2 [IU] via SUBCUTANEOUS
  Administered 2020-07-04: 3 [IU] via SUBCUTANEOUS
  Administered 2020-07-04 (×2): 1 [IU] via SUBCUTANEOUS
  Administered 2020-07-05: 5 [IU] via SUBCUTANEOUS
  Administered 2020-07-05 – 2020-07-06 (×3): 2 [IU] via SUBCUTANEOUS

## 2020-06-26 MED ORDER — METOPROLOL TARTRATE 25 MG PO TABS: 25.0000 mg | ORAL_TABLET | Freq: Once | ORAL | Status: AC

## 2020-06-26 MED ORDER — POVIDONE-IODINE 10 % EX SWAB: 2.0000 "application " | Freq: Once | CUTANEOUS | Status: DC

## 2020-06-26 MED ORDER — SODIUM CHLORIDE 0.9 % IV SOLN: INTRAVENOUS | Status: DC

## 2020-06-26 MED ORDER — LORATADINE 10 MG PO TABS
5.0000 mg | ORAL_TABLET | Freq: Every day | ORAL | Status: DC
  Administered 2020-06-26 – 2020-07-06 (×5): 5 mg via ORAL
  Filled 2020-06-26 (×10): qty 1

## 2020-06-26 MED ORDER — ONDANSETRON HCL 4 MG/2ML IJ SOLN
INTRAMUSCULAR | Status: DC | PRN
  Administered 2020-06-26: 4 mg via INTRAVENOUS

## 2020-06-26 MED ORDER — INSULIN ASPART 100 UNIT/ML ~~LOC~~ SOLN
0.0000 [IU] | Freq: Every day | SUBCUTANEOUS | Status: DC
  Administered 2020-06-26: 3 [IU] via SUBCUTANEOUS
  Administered 2020-06-28 – 2020-06-30 (×3): 2 [IU] via SUBCUTANEOUS

## 2020-06-26 MED ORDER — METOCLOPRAMIDE HCL 5 MG PO TABS: 5.0000 mg | ORAL_TABLET | Freq: Three times a day (TID) | ORAL | Status: DC | PRN

## 2020-06-26 MED ORDER — ROCURONIUM BROMIDE 10 MG/ML (PF) SYRINGE
PREFILLED_SYRINGE | INTRAVENOUS | Status: AC
  Filled 2020-06-26: qty 10

## 2020-06-26 MED ORDER — OXYBUTYNIN CHLORIDE ER 10 MG PO TB24
10.0000 mg | ORAL_TABLET | Freq: Every day | ORAL | Status: DC
  Administered 2020-06-26 – 2020-07-05 (×10): 10 mg via ORAL
  Filled 2020-06-26 (×10): qty 1

## 2020-06-26 MED ORDER — 0.9 % SODIUM CHLORIDE (POUR BTL) OPTIME
TOPICAL | Status: DC | PRN
  Administered 2020-06-26: 1000 mL

## 2020-06-26 MED ORDER — FLUTICASONE PROPIONATE 50 MCG/ACT NA SUSP
2.0000 | Freq: Every day | NASAL | Status: DC
  Administered 2020-06-26 – 2020-07-06 (×12): 2 via NASAL
  Filled 2020-06-26: qty 16

## 2020-06-26 MED ORDER — CEFAZOLIN SODIUM-DEXTROSE 1-4 GM/50ML-% IV SOLN
1.0000 g | Freq: Two times a day (BID) | INTRAVENOUS | Status: AC
  Administered 2020-06-26 – 2020-06-27 (×2): 1 g via INTRAVENOUS
  Filled 2020-06-26 (×2): qty 50

## 2020-06-26 SURGICAL SUPPLY — 60 items
BANDAGE ESMARK 6X9 LF (GAUZE/BANDAGES/DRESSINGS) IMPLANT
BIT DRILL 4.3MMS DISTAL GRDTED (BIT) ×1 IMPLANT
BNDG COHESIVE 4X5 TAN STRL (GAUZE/BANDAGES/DRESSINGS) ×3 IMPLANT
BNDG ELASTIC 4X5.8 VLCR STR LF (GAUZE/BANDAGES/DRESSINGS) ×3 IMPLANT
BNDG ELASTIC 6X5.8 VLCR STR LF (GAUZE/BANDAGES/DRESSINGS) ×3 IMPLANT
BNDG ESMARK 6X9 LF (GAUZE/BANDAGES/DRESSINGS)
BNDG GAUZE ELAST 4 BULKY (GAUZE/BANDAGES/DRESSINGS) ×3 IMPLANT
BRUSH SCRUB EZ PLAIN DRY (MISCELLANEOUS) ×6 IMPLANT
COVER MAYO STAND STRL (DRAPES) IMPLANT
COVER SURGICAL LIGHT HANDLE (MISCELLANEOUS) ×3 IMPLANT
COVER WAND RF STERILE (DRAPES) ×3 IMPLANT
DRAPE C-ARM 42X72 X-RAY (DRAPES) ×3 IMPLANT
DRAPE C-ARMOR (DRAPES) ×3 IMPLANT
DRAPE IMP U-DRAPE 54X76 (DRAPES) ×3 IMPLANT
DRAPE INCISE IOBAN 66X45 STRL (DRAPES) ×3 IMPLANT
DRAPE ORTHO SPLIT 77X108 STRL (DRAPES)
DRAPE STERI IOBAN 125X83 (DRAPES) ×3 IMPLANT
DRAPE SURG ORHT 6 SPLT 77X108 (DRAPES) IMPLANT
DRAPE U-SHAPE 47X51 STRL (DRAPES) IMPLANT
DRILL 4.3MMS DISTAL GRADUATED (BIT) ×3
DRSG MEPILEX BORDER 4X4 (GAUZE/BANDAGES/DRESSINGS) ×9 IMPLANT
ELECT REM PT RETURN 9FT ADLT (ELECTROSURGICAL) ×3
ELECTRODE REM PT RTRN 9FT ADLT (ELECTROSURGICAL) ×1 IMPLANT
EVACUATOR 1/8 PVC DRAIN (DRAIN) IMPLANT
GAUZE SPONGE 4X4 12PLY STRL (GAUZE/BANDAGES/DRESSINGS) ×3 IMPLANT
GAUZE XEROFORM 1X8 LF (GAUZE/BANDAGES/DRESSINGS) IMPLANT
GLOVE BIO SURGEON STRL SZ7.5 (GLOVE) IMPLANT
GLOVE BIO SURGEON STRL SZ8 (GLOVE) ×3 IMPLANT
GLOVE BIOGEL PI IND STRL 7.5 (GLOVE) ×1 IMPLANT
GLOVE BIOGEL PI IND STRL 8 (GLOVE) ×1 IMPLANT
GLOVE BIOGEL PI INDICATOR 7.5 (GLOVE) ×2
GLOVE BIOGEL PI INDICATOR 8 (GLOVE) ×2
GOWN STRL REUS W/ TWL LRG LVL3 (GOWN DISPOSABLE) ×2 IMPLANT
GOWN STRL REUS W/ TWL XL LVL3 (GOWN DISPOSABLE) ×1 IMPLANT
GOWN STRL REUS W/TWL LRG LVL3 (GOWN DISPOSABLE) ×6
GOWN STRL REUS W/TWL XL LVL3 (GOWN DISPOSABLE) ×3
GUIDEPIN 3.2X17.5 THRD DISP (PIN) ×3 IMPLANT
GUIDEWIRE BALL NOSE 100CM (WIRE) ×3 IMPLANT
HFN LAG SCREW 10.5MM X 85MM (Screw) ×3 IMPLANT
KIT BASIN OR (CUSTOM PROCEDURE TRAY) ×3 IMPLANT
KIT TURNOVER KIT B (KITS) ×3 IMPLANT
NAIL AFFIXUS RT 11X340MM (Nail) ×3 IMPLANT
PACK ORTHO EXTREMITY (CUSTOM PROCEDURE TRAY) IMPLANT
PACK UNIVERSAL I (CUSTOM PROCEDURE TRAY) ×3 IMPLANT
PAD ARMBOARD 7.5X6 YLW CONV (MISCELLANEOUS) ×6 IMPLANT
SCREW BONE CORTICAL 5.0X38 (Screw) ×3 IMPLANT
SPONGE LAP 18X18 RF (DISPOSABLE) ×3 IMPLANT
STAPLER VISISTAT 35W (STAPLE) ×3 IMPLANT
STOCKINETTE IMPERVIOUS LG (DRAPES) IMPLANT
SUT PROLENE 3 0 PS 2 (SUTURE) IMPLANT
SUT VIC AB 0 CT1 27 (SUTURE)
SUT VIC AB 0 CT1 27XBRD ANBCTR (SUTURE) IMPLANT
SUT VIC AB 2-0 CT1 27 (SUTURE)
SUT VIC AB 2-0 CT1 TAPERPNT 27 (SUTURE) IMPLANT
SUT VIC AB 2-0 CT3 27 (SUTURE) IMPLANT
TOWEL GREEN STERILE (TOWEL DISPOSABLE) ×6 IMPLANT
TOWEL GREEN STERILE FF (TOWEL DISPOSABLE) ×3 IMPLANT
TUBE CONNECTING 12'X1/4 (SUCTIONS) ×1
TUBE CONNECTING 12X1/4 (SUCTIONS) ×2 IMPLANT
YANKAUER SUCT BULB TIP NO VENT (SUCTIONS) ×3 IMPLANT

## 2020-06-26 NOTE — Progress Notes (Signed)
Initial Nutrition Assessment  RD working remotely.  DOCUMENTATION CODES:   Not applicable  INTERVENTION:   -Once diet is advanced:  -Glucerna Shake po BID, each supplement provides 220 kcal and 10 grams of protein -MVI with minerals daily  NUTRITION DIAGNOSIS:   Increased nutrient needs related to post-op healing as evidenced by estimated needs.  GOAL:   Patient will meet greater than or equal to 90% of their needs  MONITOR:   PO intake, Supplement acceptance, Diet advancement, Labs, Skin, I & O's, Weight trends  REASON FOR ASSESSMENT:   Consult Hip fracture protocol  ASSESSMENT:   Vanessa Gibson is a 86 y.o. female who complains of  Pain to the right hip. She lives alone in a retirement community. Yesterday she states that she fell while she was trying to get up off of the couch.  Pt admitted with rt femur fracture s/p fall.  Reviewed I/O's: -300 ml x 24 hours  UOP: 300 ml x 24 houes  Pt down in OR at time of attempted contact. Unable to obtain further nutrition-related history at this time.   Per orthopedics notes, plan for IMN of rt femur today. Pt is currently NPO for procedure.   Reviewed wt hx; wt has been stable over the past 6 months.   Pt with increased nutritional needs due to post-operative healing and would benefit from addition of oral nutrition supplements.   Lab Results  Component Value Date   HGBA1C 6.4 (A) 06/04/2020   PTA DM medications are 20 units tresiba daily, 6 units insulin aspart daily at breakfast, and 8 units insulin aspart daily at supper.   Labs reviewed: Na: 131, CBGS: 154-169 (inpatient orders for glycemic control are 0-9 units insulin aspart every 4 hours).   Diet Order:   Diet Order            Diet Heart Room service appropriate? Yes; Fluid consistency: Thin  Diet effective now                 EDUCATION NEEDS:   No education needs have been identified at this time  Skin:  Skin Assessment: Reviewed RN  Assessment  Last BM:  Unknown  Height:   Ht Readings from Last 1 Encounters:  06/25/20 5' (1.524 m)    Weight:   Wt Readings from Last 1 Encounters:  06/25/20 60.3 kg    Ideal Body Weight:  45.5 kg  BMI:  Body mass index is 25.97 kg/m.  Estimated Nutritional Needs:   Kcal:  1600-1800  Protein:  75-90 grams  Fluid:  > 1.6 L    Loistine Chance, RD, LDN, [place] Registered Dietitian II Certified Diabetes Care and Education Specialist Please refer to [facility] for RD and/or RD on-call/weekend/after hours pager

## 2020-06-26 NOTE — Progress Notes (Signed)
PROGRESS NOTE    Vanessa Gibson  UEA:[id number] DOB: 20-Oct-1928 DOA: 06/25/2020 PCP: Tammi Sou, MD  Brief Narrative:  86 white female born in [place] originally from [employer] living facility [facility] Unwitnessed fall with right hip fracture laceration left jaw pressure bandage was placed received fentanyl MI 1999 HLD chronic vertigo, chronic pancreatitis?  Nonalcoholic ?  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  Vertigo related the placed 30-day Holter monitor at the time Recent visit to primary care physician 06/15/2020-major addressed issues at the time major depression chronic fatigue-had seen dermatology with 2 lesions removed from head and neck mid July --- At that visit increase Ditropan XL to 10 mg daily  Assessment & Plan:  Principal Problem:   Closed displaced fracture of right femoral neck (HCC) Active Problems:   Hypertension associated with diabetes ([place]), on Lisinopril   CKD stage 3 secondary to diabetes ([place]), on Lisinopril   Uncontrolled type 2 diabetes mellitus with hyperglycemia, with long-term current use of insulin ([place])   1. Right hip fracture a. Plan as per orthopedics including pain management, DVT prophylaxis weightbearing status b. Therapy to see for disposition planning-patient would prefer to go home as they have caregivers at home already c. If eating can saline lock in the morning 2. Hypertension a. Continue amlodipine 5, metoprolol 12.5 twice daily b. Pressures are well controlled at this time 3. Mild neck laceration without any complication a. This is from a dermatological procedure which had scarred over and then seem to open up after her fall b. If she goes to skilled facility this can be removed in 7 days, if not, ALF can arrange for PCP to see in 5 to 7 days to remove the same 4. Mild periop anemia leukocytosis a. Likely stress  demargination b. Recheck a.m. 5. MI 1999 a. Empiric aspirin to continue-no further details 6. DM TY 2 with complication of neuropathy a. On Tresiba which is nonformulary b. Blood sugars are controlled every 4 on sliding scale c. We will add long-acting if consistently above 180 and will change coverage to 4 times daily AC at bedtime d. I am okay with her having a regular diet as long as her sugars do not go way above 200 e. Continue Lyrica for neuropathy 7. Major depressive disorder a. Continue Celexa 20, Ativan 0.5 at bedtime b. Consider de-escalation off of Ativan in the outpatient setting if possible 8. Chronic vertigo 9. Questionable pancreatitis 10. Prior DCIS age 86 11. Adrenal incidentaloma   DVT prophylaxis: SCDs as per orthopedics Code Status: Full Family Communication: Long discussion at the bedside with daughter and son-in-law Disposition:   Status is: Inpatient  Remains inpatient appropriate because:Ongoing active pain requiring inpatient pain management   Dispo: The patient is from: Home              Anticipated d/c is to: Unclear at this time patient does not wish to go to skilled but may need to consider this              Anticipated d/c date is: 2 days              Patient currently is not medically stable to d/c.       Consultants:   Orthopedics  Procedures: Right hip repair  Antimicrobials: Perioperative as per Ortho   Subjective: Pleasant alert just back from surgery Pain is not present but she is  scared to move She is quite coherent and pleasant and is able to verbalize well Her family have several questions about disposition as she does have caregivers at her facility that she states that  Objective: Vitals:   06/26/20 1211 06/26/20 1226 06/26/20 1241 06/26/20 1307  BP: (!) 130/54 (!) 124/54 (!) 110/57 (!) 113/45  Pulse: 72 67 65 67  Resp: 18 15 20 16   Temp: 100 F (37.8 C)  99.2 F (37.3 C) 98.3 F (36.8 C)  TempSrc:    Oral  SpO2:  99% 91% 100% 97%  Weight:      Height:        Intake/Output Summary (Last 24 hours) at 06/26/2020 1629 Last data filed at 06/26/2020 1130 Gross per 24 hour  Intake 400 ml  Output 400 ml  Net 0 ml   Filed Weights   06/25/20 2012  Weight: 60.3 kg    Examination:  General exam: Pleasant alert oriented no distress EOMI NCAT Left neck has sutures in place from laceration mild next laceration Respiratory system: Clear no added sound Cardiovascular system: S1-S2 no murmur rub or gallop Gastrointestinal system: Soft nontender no rebound. Central nervous system: Intact no focal deficit Extremities: No lower extremity edema Skin: Wound to left neck as above Psychiatry: Euthymic pleasant  Data Reviewed: I have personally reviewed following labs and imaging studies Sodium 131 BUN/creatinine to 23/1.2 which is baseline Hemoglobin 10.5, WBC 14  Radiology Studies: DG Chest 1 View  Result Date: 06/25/2020 CLINICAL DATA:  86 year old female with fall and right hip pain. EXAM: CHEST  1 VIEW COMPARISON:  Chest radiograph dated 04/20/2018. FINDINGS: No focal consolidation, pleural effusion, or pneumothorax. There is mild cardiomegaly. Atherosclerotic calcification of the aorta. No acute osseous pathology. IMPRESSION: No acute cardiopulmonary process. Electronically Signed   By: Anner Crete M.D.   On: 06/25/2020 21:07   CT Angio Neck W and/or Wo Contrast  Result Date: 06/25/2020 CLINICAL DATA:  Moderate to severe neck trauma. Laceration to both sides of the neck. Fall. EXAM: CT ANGIOGRAPHY NECK TECHNIQUE: Multidetector CT imaging of the neck was performed using the standard protocol during bolus administration of intravenous contrast. Multiplanar CT image reconstructions and MIPs were obtained to evaluate the vascular anatomy. Carotid stenosis measurements (when applicable) are obtained utilizing NASCET criteria, using the distal internal carotid diameter as the denominator. CONTRAST:  56mL  OMNIPAQUE IOHEXOL 350 MG/ML SOLN COMPARISON:  None. FINDINGS: Aortic arch: The left common carotid artery and innominate artery share a common origin. Minimal atherosclerotic changes present at the origin of the left subclavian artery. Right carotid system: Right common carotid artery is within normal limits. Bifurcation is unremarkable. Cervical right ICA is normal. Intracranial atherosclerotic changes are present without significant stenosis. Left carotid system: The left common carotid artery is within normal limits. Bifurcation is unremarkable. Mild tortuosity is present in the cervical left ICA without significant stenosis. Intracranial atherosclerotic changes are present without significant stenosis. Vertebral arteries: Left vertebral artery is the dominant vessel. Both vertebral arteries originate from the subclavian arteries without significant stenosis. No significant stenosis is present in either vertebral artery in the neck. Vertebrobasilar junction is normal. Visualized basilar artery is unremarkable. Skeleton: Multilevel degenerative changes are present cervical spine. Chronic loss of disc height is noted from C2-T1. No focal lytic or blastic lesions are present. Other neck: Left submandibular laceration is noted. Stranding is present within the subcutaneous fat. No deep penetration is evident. Upper chest: Patchy ground-glass attenuation is present at the lung apices bilaterally.  Thoracic inlet is normal. IMPRESSION: 1. Left submandibular laceration with stranding within the subcutaneous fat. No deep penetration. 2. No acute vascular injury. 3. No significant vascular disease in the neck. 4. Atherosclerotic changes within the cavernous internal carotid arteries bilaterally without significant stenosis. 5. Multilevel degenerative changes of the cervical spine. 6. Patchy ground-glass attenuation at the lung apices bilaterally. This may represent atelectasis or edema. Infection is not excluded.  Electronically Signed   By: San Morelle M.D.   On: 06/25/2020 21:42   DG C-Arm 1-60 Min  Result Date: 06/26/2020 CLINICAL DATA:  ORIF. EXAM: RIGHT FEMUR 2 VIEWS; DG C-ARM 1-60 MIN COMPARISON:  06/25/2020. FINDINGS: ORIF right femur. Hardware intact. Anatomic alignment. Peripheral vascular calcification. IMPRESSION: ORIF right femur with anatomic alignment. Electronically Signed   By: Marcello Moores  Register   On: 06/26/2020 12:55   DG Hip Unilat W or Wo Pelvis 2-3 Views Right  Result Date: 06/25/2020 CLINICAL DATA:  86 year old female with fall and right hip pain. EXAM: DG HIP (WITH OR WITHOUT PELVIS) 2-3V RIGHT COMPARISON:  None. FINDINGS: There is a mildly displaced, comminuted and angulated fracture of the right femoral neck. The bones are osteopenic. There is no dislocation. The soft tissues are unremarkable. IMPRESSION: Right femoral neck fracture. Electronically Signed   By: Anner Crete M.D.   On: 06/25/2020 21:08   DG FEMUR, MIN 2 VIEWS RIGHT  Result Date: 06/26/2020 CLINICAL DATA:  ORIF. EXAM: RIGHT FEMUR 2 VIEWS; DG C-ARM 1-60 MIN COMPARISON:  06/25/2020. FINDINGS: ORIF right femur. Hardware intact. Anatomic alignment. Peripheral vascular calcification. IMPRESSION: ORIF right femur with anatomic alignment. Electronically Signed   By: Marcello Moores  Register   On: 06/26/2020 12:55   DG FEMUR PORT, MIN 2 VIEWS RIGHT  Result Date: 06/26/2020 CLINICAL DATA:  Intramedullary nail placement EXAM: RIGHT FEMUR PORTABLE 2 VIEW COMPARISON:  June 25, 2020 and intraoperative images June 26, 2020 FINDINGS: Frontal and lateral views were obtained. There is screw and rod fixation through an intertrochanteric femur fracture on the right. Alignment at the fracture site is near anatomic. Screw tip in proximal femoral head. No new fracture. No dislocation. No appreciable knee joint effusion. Osteoarthritic change noted in the knee joint region. IMPRESSION: Screw and nail fixation through intertrochanteric femur  fracture. No new fracture. No dislocation. Arthropathy in the right knee joint region noted. Electronically Signed   By: Lowella Grip III M.D.   On: 06/26/2020 13:51     Scheduled Meds: . [START ON 06/27/2020] amLODipine  5 mg Oral Daily  . [START ON 06/27/2020] aspirin EC  81 mg Oral Daily  . citalopram  20 mg Oral Daily  . docusate sodium  100 mg Oral BID  . [START ON 06/27/2020] enoxaparin (LOVENOX) injection  30 mg Subcutaneous Q24H  . [START ON 06/27/2020] feeding supplement (GLUCERNA SHAKE)  237 mL Oral BID BM  . fluticasone  2 spray Each Nare Daily  . insulin aspart  0-9 Units Subcutaneous Q4H  . loratadine  5 mg Oral Daily  . LORazepam  0.5 mg Oral QHS  . metoprolol tartrate  12.5 mg Oral BID  . montelukast  10 mg Oral QHS  . [START ON 06/27/2020] multivitamin with minerals  1 tablet Oral Daily  . mupirocin ointment  1 application Topical BID  . olopatadine  1 drop Both Eyes BID  . oxybutynin  10 mg Oral QHS  . pantoprazole  40 mg Oral Daily  . pravastatin  20 mg Oral q1800  . pregabalin  25 mg  Oral QPC supper  . traMADol  50 mg Oral QHS   Continuous Infusions: . sodium chloride 75 mL/hr at 06/26/20 1450  .  ceFAZolin (ANCEF) IV       LOS: 1 day    Time spent: [place], MD [facility] To contact the attending provider between 7A-7P or the covering provider during after hours 7P-7A, please log into the web site [email or web address] and access using universal [place] password for that web site. If you do not have the password, please call the hospital operator.  06/26/2020, 4:29 PM

## 2020-06-26 NOTE — Hospital Course (Addendum)
PRE-OPERATIVE DIAGNOSIS:  Right 4 Part Hip Fracture   POST-OPERATIVE DIAGNOSIS:  Right 4 Part Hip Fracture   PROCEDURE:  Intramedullary nailing of the right hip using a Biomet Affixus nail 11 x 340 statically locked.   Jeff = POA (in [place]) -he is agreeable for CIR pending approval Hopeful disposition in the next few days.

## 2020-06-26 NOTE — Progress Notes (Signed)
PHARMACY NOTE:  ANTIMICROBIAL RENAL DOSAGE ADJUSTMENT  Current antimicrobial regimen includes a mismatch between antimicrobial dosage and estimated renal function.  As per policy approved by the Pharmacy & Therapeutics and Medical Executive Committees, the antimicrobial dosage will be adjusted accordingly.  Current antimicrobial dosage:  Cefazolin 1g Q6hr x3 doses  Indication: post-op ppx  Renal Function:  Estimated Creatinine Clearance: 26.7 mL/min (A) (by C-G formula based on SCr of 1.16 mg/dL (H)).     Antimicrobial dosage has been changed to:  1g Q12hr x2 doses    Thank you for allowing pharmacy to be a part of this patient's care.  Benetta Spar, PharmD, BCPS, BCCP Clinical Pharmacist  Please check AMION for all [place] phone numbers After 10:00 PM, call [place] [phone]

## 2020-06-26 NOTE — Anesthesia Procedure Notes (Signed)
Procedure Name: Intubation Date/Time: 06/26/2020 10:15 AM Performed by: Kathryne Hitch, CRNA Pre-anesthesia Checklist: Patient identified, Emergency Drugs available, Suction available and Patient being monitored Patient Re-evaluated:Patient Re-evaluated prior to induction Oxygen Delivery Method: Circle system utilized Preoxygenation: Pre-oxygenation with 100% oxygen Induction Type: IV induction Ventilation: Mask ventilation without difficulty Laryngoscope Size: Miller and 2 Grade View: Grade I Tube type: Oral Tube size: 7.0 mm Number of attempts: 1 Airway Equipment and Method: Stylet and Oral airway Placement Confirmation: ETT inserted through vocal cords under direct vision,  positive ETCO2 and breath sounds checked- equal and bilateral Secured at: 21 cm Tube secured with: Tape Dental Injury: Teeth and Oropharynx as per pre-operative assessment

## 2020-06-26 NOTE — Transfer of Care (Signed)
Immediate Anesthesia Transfer of Care Note  Patient: Vanessa Gibson  Procedure(s) Performed: INTERMEDULLARY FEMORAL AFFIXUS NAIL (Right Hip)  Patient Location: PACU  Anesthesia Type:General  Level of Consciousness: drowsy and patient cooperative  Airway & Oxygen Therapy: Patient Spontanous Breathing  Post-op Assessment: Report given to RN and Post -op Vital signs reviewed and stable  Post vital signs: Reviewed and stable  Last Vitals:  Vitals Value Taken Time  BP 128/61 06/26/20 1144  Temp 37.9 C 06/26/20 1145  Pulse 77 06/26/20 1145  Resp 16 06/26/20 1145  SpO2 89 % 06/26/20 1145  Vitals shown include unvalidated device data.  Last Pain:  Vitals:   06/26/20 0413  TempSrc: Oral  PainSc:       Patients Stated Pain Goal: 3 (79/15/05 6979)  Complications: No complications documented.

## 2020-06-26 NOTE — Op Note (Signed)
06/26/2020  12:20 PM  PATIENT:  Vanessa Gibson  86 y.o. female  PRE-OPERATIVE DIAGNOSIS:  Right 4 Part Hip Fracture  POST-OPERATIVE DIAGNOSIS:  Right 4 Part Hip Fracture  PROCEDURE:  Intramedullary nailing of the right hip using a Biomet Affixus nail 11 x 340 statically locked.  SURGEON:  Astrid Divine. Marcelino Scot, M.D.  ASSISTANT:  Ainsley Spinner, PA-C.  ANESTHESIA:  General.  COMPLICATIONS:  None.  ESTIMATED BLOOD LOSS:  Less than 150 mL.  Ins: 300 mL cryst, 100 mL albumin  DISPOSITION:  To PACU.  CONDITION:  Stable.  BRIEF SUMMARY AND INDICATION OF PROCEDURE:  Vanessa Gibson is a 86 y.o. year- old with multiple medical problems.  I discussed with the patient the risks and benefits of surgical treatment including the potential for malunion, nonunion, symptomatic hardware, heart attack, stroke, neurovascular injury, bleeding, and others.  After full discussion, the patient and family wished to proceed.  BRIEF SUMMARY OF PROCEDURE:  The patient was taken to the operating room where general anesthesia was induced.  She was positioned supine on the Hana fracture table.  A closed reduction maneuver was performed of the fractured proximal femur and this was confirmed on both AP and lateral xray views. A thorough scrub and wash with chlorhexidine and then Betadine scrub and paint was performed.  After sterile drapes and time-out, a long instrument was used to identify the appropriate starting position under C-arm on both AP and lateral images.  A 3 cm incision was made proximal to the greater trochanter.  The curved cannulated awl was inserted just medial to the tip of the lateral trochanter and then the starting guidewire advanced into the proximal femur.  This was checked on AP and lateral views.  The starting reamer was engaged with the soft tissue protected by a sleeve.  The curved ball-tipped guidewire was then inserted, making sure it was just posterior as possible in the distal femur and  across the fracture site, which stayed in a reduced position.  It was sequentially reamed up to 13 and an 11 x 340 mm nail inserted to the appropriate depth.  The guidewire for the lag screw was then inserted with the appropriate anteversion to make sure it was in a center-center position.  This was measured and the lag screw placed with excellent purchase and position checked on both views.  The antirotation screw was then engaged within the groove of the lag screw, which was allowed to telescope.  Traction was released and compression achieved with the screw.  This was followed by placement of one distal locking screw using perfect circle technique.  This was confirmed on AP and lateral images. Wounds were irrigated thoroughly, closed in a standard layered fashion. Sterile gently compressive dressings were applied.  Ainsley Spinner, PA-C, assisted throughout.  The patient was awakened from anesthesia and transported to the PACU in stable condition.  PROGNOSIS:  The patient will be weightbearing as tolerated with physical therapy beginning DVT prophylaxis as soon as deemed stable by the Primary Care Service.  She has no range of motion precautions.  We will continue to follow through at the hospital.  Anticipate follow up in the office in 2 weeks for removal of sutures and further evaluation.     Astrid Divine. Marcelino Scot, M.D.

## 2020-06-26 NOTE — Progress Notes (Signed)
Inpatient Diabetes Program Recommendations  AACE/ADA: New Consensus Statement on Inpatient Glycemic Control (2015)  Target Ranges:  Prepandial:   less than 140 mg/dL      Peak postprandial:   less than 180 mg/dL (1-2 hours)      Critically ill patients:  140 - 180 mg/dL   Results for Vanessa Gibson, Vanessa Gibson (MRN [id number]) as of 06/26/2020 10:21  Ref. Range 06/25/2020 23:00 06/26/2020 04:17 06/26/2020 07:47  Glucose-Capillary Latest Ref Range: 70 - 99 mg/dL 284 (H)  5 units NOVOLOG  169 (H)  2 units NOVOLOG  154 (H)  2 units NOVOLOG  5 mg Decadron given at 10am    Admit with: R Hip Fracture  History: DM, CKD  Home DM Meds: Tresiba 16 QD        Novolog 6 units BID (with Breakfast and Dinner)  Current Orders: Novolog Sensitive Correction Scale/ SSI (0-9 units) Q4 hours     Endocrinologist: Dr. Elayne Snare with [facility]--last seen 06/04/2020--was told to Reduce Tresiba to 16 units Daily and Continue Novolog 6 units BID with meals.   MD- Patient will likely need some basal insulin back after surgery.  Please consider adding Lantus 12 units QHS for tonight (75% total home dose)     --Will follow patient during hospitalization--  Wyn Quaker RN, MSN, CDE Diabetes Coordinator Inpatient Glycemic Control Team Team Pager: [phone] (8a-5p)

## 2020-06-26 NOTE — Consult Note (Addendum)
ORTHOPAEDIC CONSULTATION  REQUESTING PHYSICIAN: Nita Sells, MD  Chief Complaint: right hip fx.  HPI: Vanessa Gibson is a 86 y.o. female who complains of  Pain to the right hip. She lives alone in a retirement community. Yesterday she states that she fell while she was trying to get up off of the couch. At baseline she is able to get around with assistance of devices such as a walker.   Orthopedics was consulted for evaluation.  XR shows a Right femoral neck Fx.  CT Head/Chest shows no acute trauma.  76yof with  history of MI, CVA, DVT, PE.  Previously ambulatory.   Past Medical History:  Diagnosis Date  . Abnormal EKG    RBBB and LAFB  . Anxiety and depression   . Axillary adenopathy 01/2020   CT 12/2019 incidental finding, pt had recent covid vaccine in ipsilateral arm.  F/u axillary u/s 1 mo later showed dec in size of node.  F/u axillary u/s 3 mo recommended.  . Chronic renal insufficiency, stage 3 (moderate)    GFR @40   . Chronic venous insufficiency   . Diabetes mellitus ([place])    managed by endo Dr. Dwyane Dee  . Dizziness   . DOE (dyspnea on exertion)    no desat, CT chest ok except mild small airways dz. Hiatal hernia.  DECONDITIONING  . GERD (gastroesophageal reflux disease)   . Hard of hearing   . Hiatal hernia    Moderate size (contributing to pt's DOE?)  . History of bilateral breast cancer approx age 74   bilat mastectomy then tamoxifen x 10 yrs  . Hyperlipemia   . Hypertension   . IBS (irritable bowel syndrome)   . Mandibular fracture ([place]) 12/04/2015  . MI (myocardial infarction) ([place])   . Osteoarthritis, multiple sites   . Paresthesias    burning, hands and feet.  Gabapentin no help.  Dr. Dwyane Dee started her on lyrica 02/2020.  . Skin cancer of face   . Ventral hernia "many years"   supraumbilical   Past Surgical History:  Procedure Laterality Date  . BREAST SURGERY    . Cardiac event monitor  2016   Normal  . CATARACT EXTRACTION W/ INTRAOCULAR  LENS  IMPLANT, BILATERAL Bilateral   . CHOLECYSTECTOMY OPEN  1970's  . MASTECTOMY Bilateral   . PFTs  04/05/2018   Interpretation: The FVC, FEV1, FEV1/FVC ratio and FEF25-75% are within normal limits. Patient effort was erratic, but mild dec in diff capacity noted--mild dec functional alveolar tissue  . TRANSTHORACIC ECHOCARDIOGRAM  11/09/2019   EF 60-65%, grd I DD.   Social History   Socioeconomic History  . Marital status: Divorced    Spouse name: Not on file  . Number of children: Not on file  . Years of education: Not on file  . Highest education level: Not on file  Occupational History  . Not on file  Tobacco Use  . Smoking status: Never Smoker  . Smokeless tobacco: Never Used  Vaping Use  . Vaping Use: Never used  Substance and Sexual Activity  . Alcohol use: No  . Drug use: No  . Sexual activity: Never  Other Topics Concern  . Not on file  Social History Narrative   Widow, lives alone.  Has 3 kids.    Moved to [place] from [place] in 2016 to be near her daughter.  Orig from [place], has been in [place] since late teens.   Homemaker.   Never smoker.  No alcohol.  Lives at [facility], [place].    Social Determinants of Health   Financial Resource Strain:   . Difficulty of Paying Living Expenses:   Food Insecurity:   . Worried About Charity fundraiser in the Last Year:   . Arboriculturist in the Last Year:   Transportation Needs:   . Film/video editor (Medical):   Marland Kitchen Lack of Transportation (Non-Medical):   Physical Activity:   . Days of Exercise per Week:   . Minutes of Exercise per Session:   Stress:   . Feeling of Stress :   Social Connections:   . Frequency of Communication with Friends and Family:   . Frequency of Social Gatherings with Friends and Family:   . Attends Religious Services:   . Active Member of Clubs or Organizations:   . Attends Archivist Meetings:   Marland Kitchen Marital Status:    History reviewed. No pertinent family  history. No Known Allergies Prior to Admission medications   Medication Sig Start Date End Date Taking? Authorizing Provider  ACCU-CHEK GUIDE test strip CHECK BLOOD SUGAR THREE TO FOUR TIMES DAILY Patient taking differently: 1 each by Other route in the morning, at noon, in the evening, and at bedtime.  09/12/19  Yes Elayne Snare, MD  Accu-Chek Softclix Lancets lancets Use as instructed to check blood sugar 3-4 times daily. Patient taking differently: 1 each by Other route See admin instructions. Use as instructed to check blood sugar 3-4 times daily. 03/05/20  Yes Elayne Snare, MD  Alcohol Swabs (B-D SINGLE USE SWABS REGULAR) PADS USE TO CLEAN FINGER BEFORE CHECKING BLOOD SUGARS AND CLEAN SKIN BEFORE INSULIN INJECTIONS. Patient taking differently: 1 each by Other route See admin instructions. Use to clean finger before checking blood sugars and clean skin before insulin injections. 05/16/19  Yes Elayne Snare, MD  amLODipine (NORVASC) 5 MG tablet Take 1 tablet (5 mg total) by mouth daily. 02/01/20  Yes McGowen, Adrian Blackwater, MD  aspirin EC 81 MG tablet Take 81 mg by mouth daily.   Yes [provider]  BD PEN NEEDLE NANO U/F 32G X 4 MM MISC USE TO INJECT INSULINS 4 TIMES DAILY. Patient taking differently: 1 each by Other route in the morning, at noon, in the evening, and at bedtime.  09/12/19  Yes Elayne Snare, MD  Blood Glucose Monitoring Suppl (ACCU-CHEK GUIDE) w/Device KIT Use Accu Chek Guide meter to check blood sugar 3-4 times daily. Patient taking differently: 1 each by Other route See admin instructions. Use Accu Chek Guide meter to check blood sugar 3-4 times daily.  06/08/20  Yes Elayne Snare, MD  citalopram (CELEXA) 20 MG tablet TAKE 1 TABLET ONCE DAILY. Patient taking differently: Take 20 mg by mouth daily.  06/21/20  Yes McGowen, Adrian Blackwater, MD  diclofenac Sodium (VOLTAREN) 1 % GEL Apply 4 g topically 4 (four) times daily. 06/08/20  Yes McGowen, Adrian Blackwater, MD  fluticasone (FLONASE) 50 MCG/ACT  nasal spray Place 2 sprays into both nostrils daily. 06/11/20  Yes McGowen, Adrian Blackwater, MD  insulin aspart (NOVOLOG FLEXPEN) 100 UNIT/ML FlexPen Inject 6 Units into the skin in the morning and at bedtime. Inject 6 units under the skin before breakfast and 8 units before supper.    Yes [provider]  levocetirizine (XYZAL) 5 MG tablet Take 0.5 tablets (2.5 mg total) by mouth every evening. 08/18/19  Yes Briscoe Deutscher, DO  LORazepam (ATIVAN) 0.5 MG tablet TAKE 1 TABLET AT BEDTIME Patient taking  differently: Take 0.5 mg by mouth at bedtime.  02/15/20  Yes McGowen, Adrian Blackwater, MD  lovastatin (MEVACOR) 20 MG tablet TAKE ONE TABLET AT BEDTIME. Patient taking differently: Take 20 mg by mouth at bedtime.  05/25/20  Yes McGowen, Adrian Blackwater, MD  metoprolol tartrate (LOPRESSOR) 25 MG tablet Take 0.5 tablets (12.5 mg total) by mouth 2 (two) times daily. TAKE 1 TABLET BY MOUTH TWICE DAILY. Patient taking differently: Take 25 mg by mouth 2 (two) times daily.  12/07/19  Yes Orma Flaming, MD  montelukast (SINGULAIR) 10 MG tablet Take 1 tablet (10 mg total) by mouth at bedtime. 08/18/19  Yes Briscoe Deutscher, DO  Multiple Vitamins-Minerals (CENTRUM SILVER 50+WOMEN) TABS Take 1 tablet by mouth daily.   Yes [provider]  mupirocin ointment (BACTROBAN) 2 % Apply 1 application topically 2 (two) times daily.  06/07/20  Yes [provider]  ofloxacin (FLOXIN) 0.3 % OTIC solution INSTILL 5 DROPS IN EACH EAR ONCE A DAY. Patient taking differently: Place 5 drops into both ears daily.  01/04/20  Yes Orma Flaming, MD  Olopatadine HCl (PATADAY) 0.2 % SOLN Apply 1 drop to each eye once daily. 05/05/19  Yes Vivi Barrack, MD  omeprazole (PRILOSEC) 40 MG capsule TAKE 1 CAPSULE (40 MG TOTAL) BY MOUTH DAILY. 05/04/20  Yes McGowen, Adrian Blackwater, MD  oxybutynin (DITROPAN XL) 10 MG 24 hr tablet Take 1 tablet (10 mg total) by mouth at bedtime. 06/15/20  Yes McGowen, Adrian Blackwater, MD  pregabalin (LYRICA) 25 MG capsule Take 1  capsule (25 mg total) by mouth daily after supper. 03/05/20  Yes Elayne Snare, MD  traMADol (ULTRAM) 50 MG tablet TAKE (1) TABLET EVERY EIGHT HOURS AS NEEDED FOR PAIN. Patient taking differently: Take 50 mg by mouth daily.  05/30/20  Yes McGowen, Adrian Blackwater, MD  TRESIBA FLEXTOUCH 100 UNIT/ML SOPN FlexTouch Pen INJECT 20 UNITS SUBCUTANEOUSLY ONE TIME DAILY Patient taking differently: Inject 16 Units into the skin daily.  05/16/19  Yes Elayne Snare, MD  azelastine (ASTELIN) 0.1 % nasal spray Place 1 spray into both nostrils 2 (two) times daily. Use in each nostril as directed Patient not taking: Reported on 06/25/2020 07/04/19   Briscoe Deutscher, DO  Tiotropium Bromide-Olodaterol (STIOLTO RESPIMAT) 2.5-2.5 MCG/ACT AERS Inhale 2 puffs into the lungs daily. Patient not taking: Reported on 06/25/2020 06/11/20   Tammi Sou, MD   DG Chest 1 View  Result Date: 06/25/2020 CLINICAL DATA:  86 year old female with fall and right hip pain. EXAM: CHEST  1 VIEW COMPARISON:  Chest radiograph dated 04/20/2018. FINDINGS: No focal consolidation, pleural effusion, or pneumothorax. There is mild cardiomegaly. Atherosclerotic calcification of the aorta. No acute osseous pathology. IMPRESSION: No acute cardiopulmonary process. Electronically Signed   By: Anner Crete M.D.   On: 06/25/2020 21:07   CT Angio Neck W and/or Wo Contrast  Result Date: 06/25/2020 CLINICAL DATA:  Moderate to severe neck trauma. Laceration to both sides of the neck. Fall. EXAM: CT ANGIOGRAPHY NECK TECHNIQUE: Multidetector CT imaging of the neck was performed using the standard protocol during bolus administration of intravenous contrast. Multiplanar CT image reconstructions and MIPs were obtained to evaluate the vascular anatomy. Carotid stenosis measurements (when applicable) are obtained utilizing NASCET criteria, using the distal internal carotid diameter as the denominator. CONTRAST:  107m OMNIPAQUE IOHEXOL 350 MG/ML SOLN COMPARISON:  None. FINDINGS:  Aortic arch: The left common carotid artery and innominate artery share a common origin. Minimal atherosclerotic changes present at the origin of the left  subclavian artery. Right carotid system: Right common carotid artery is within normal limits. Bifurcation is unremarkable. Cervical right ICA is normal. Intracranial atherosclerotic changes are present without significant stenosis. Left carotid system: The left common carotid artery is within normal limits. Bifurcation is unremarkable. Mild tortuosity is present in the cervical left ICA without significant stenosis. Intracranial atherosclerotic changes are present without significant stenosis. Vertebral arteries: Left vertebral artery is the dominant vessel. Both vertebral arteries originate from the subclavian arteries without significant stenosis. No significant stenosis is present in either vertebral artery in the neck. Vertebrobasilar junction is normal. Visualized basilar artery is unremarkable. Skeleton: Multilevel degenerative changes are present cervical spine. Chronic loss of disc height is noted from C2-T1. No focal lytic or blastic lesions are present. Other neck: Left submandibular laceration is noted. Stranding is present within the subcutaneous fat. No deep penetration is evident. Upper chest: Patchy ground-glass attenuation is present at the lung apices bilaterally. Thoracic inlet is normal. IMPRESSION: 1. Left submandibular laceration with stranding within the subcutaneous fat. No deep penetration. 2. No acute vascular injury. 3. No significant vascular disease in the neck. 4. Atherosclerotic changes within the cavernous internal carotid arteries bilaterally without significant stenosis. 5. Multilevel degenerative changes of the cervical spine. 6. Patchy ground-glass attenuation at the lung apices bilaterally. This may represent atelectasis or edema. Infection is not excluded. Electronically Signed   By: San Morelle M.D.   On: 06/25/2020  21:42   DG Hip Unilat W or Wo Pelvis 2-3 Views Right  Result Date: 06/25/2020 CLINICAL DATA:  86 year old female with fall and right hip pain. EXAM: DG HIP (WITH OR WITHOUT PELVIS) 2-3V RIGHT COMPARISON:  None. FINDINGS: There is a mildly displaced, comminuted and angulated fracture of the right femoral neck. The bones are osteopenic. There is no dislocation. The soft tissues are unremarkable. IMPRESSION: Right femoral neck fracture. Electronically Signed   By: Anner Crete M.D.   On: 06/25/2020 21:08    Positive ROS: All other systems have been reviewed and were otherwise negative with the exception of those mentioned in the HPI and as above.  Objective: Labs cbc Recent Labs    06/25/20 2036 06/26/20 0224  WBC 10.4 14.0*  HGB 11.7* 10.5*  HCT 35.4* 31.8*  PLT 257 231    Labs inflam No results for input(s): CRP in the last 72 hours.  Invalid input(s): ESR  Labs coag Recent Labs    06/25/20 2036  INR 1.1    Recent Labs    06/25/20 2036 06/26/20 0224  NA 130* 131*  K 4.5 4.7  CL 97* 97*  CO2 24 23  GLUCOSE 259* 200*  BUN 21 23  CREATININE 1.22* 1.16*  CALCIUM 8.8* 8.8*    Physical Exam: Vitals:   06/25/20 2230 06/26/20 0413  BP: (!) 164/71 (!) 157/68  Pulse: 90 80  Resp: 18 16  Temp: 98.9 F (37.2 C) 98.3 F (36.8 C)  SpO2: 98% 97%   General: Alert, no acute distress. Resting comfortably in bed Mental status: Alert and Oriented x3 Neurologic: Speech Clear and organized, no gross focal findings or movement disorder appreciated. Respiratory: No cyanosis, no use of accessory musculature Cardiovascular: No pedal edema GI: Abdomen is soft and non-tender, non-distended. Skin: Warm and dry.  No lesions in the area of chief complaint Extremities: Warm and well perfused w/o edema Psychiatric: Patient is competent for consent with normal mood and affect  MUSCULOSKELETAL:  RLE with good strength to df/pf. 2+DP/PT pulses. Deformity of the right  hip.  Other  extremities are atraumatic with painless ROM and NVI.  Assessment / Plan: Principal Problem:   Closed displaced fracture of right femoral neck (HCC) Active Problems:   Hypertension associated with diabetes ([place]), on Lisinopril   CKD stage 3 secondary to diabetes ([place]), on Lisinopril   Uncontrolled type 2 diabetes mellitus with hyperglycemia, with long-term current use of insulin (HCC)    Weightbearing: NWB until post-op NPO except for meds. after MN for OR 06/26/20 VTE prophylaxis: hold until post-op Pain control: vicoden, morphine Follow - up plan:   Plan for OR with Dr. Marcelino Scot this morning for IMN of right femur.     Rachael Fee PA-C 06/26/2020 6:25 AM

## 2020-06-26 NOTE — Anesthesia Preprocedure Evaluation (Addendum)
Anesthesia Evaluation  Patient identified by MRN, date of birth, ID band Patient awake    Reviewed: Allergy & Precautions, NPO status , Patient's Chart, lab work & pertinent test results  History of Anesthesia Complications Negative for: history of anesthetic complications  Airway Mallampati: I  TM Distance: >3 FB Neck ROM: Full    Dental  (+) Dental Advisory Given   Pulmonary neg pulmonary ROS,    Pulmonary exam normal        Cardiovascular hypertension, Pt. on medications and Pt. on home beta blockers + Past MI  Normal cardiovascular exam     Neuro/Psych PSYCHIATRIC DISORDERS Anxiety Depression negative neurological ROS     GI/Hepatic Neg liver ROS, hiatal hernia, GERD  ,  Endo/Other  negative endocrine ROSdiabetes  Renal/GU Renal disease     Musculoskeletal negative musculoskeletal ROS (+)   Abdominal   Peds  Hematology negative hematology ROS (+)   Anesthesia Other Findings   Reproductive/Obstetrics                            Anesthesia Physical Anesthesia Plan  ASA: III  Anesthesia Plan: General   Post-op Pain Management:    Induction: Intravenous  PONV Risk Score and Plan: 3 and Ondansetron, Dexamethasone and Diphenhydramine  Airway Management Planned: Oral ETT  Additional Equipment:   Intra-op Plan:   Post-operative Plan: Extubation in OR  Informed Consent: I have reviewed the patients History and Physical, chart, labs and discussed the procedure including the risks, benefits and alternatives for the proposed anesthesia with the patient or authorized representative who has indicated his/her understanding and acceptance.     Dental advisory given  Plan Discussed with: Anesthesiologist and CRNA  Anesthesia Plan Comments:        Anesthesia Quick Evaluation

## 2020-06-26 NOTE — Anesthesia Postprocedure Evaluation (Signed)
Anesthesia Post Note  Patient: Vanessa Gibson  Procedure(s) Performed: INTERMEDULLARY FEMORAL AFFIXUS NAIL (Right Hip)     Patient location during evaluation: PACU Anesthesia Type: General Level of consciousness: sedated Pain management: pain level controlled Vital Signs Assessment: post-procedure vital signs reviewed and stable Respiratory status: spontaneous breathing and respiratory function stable Cardiovascular status: stable Postop Assessment: no apparent nausea or vomiting Anesthetic complications: no   No complications documented.  Last Vitals:  Vitals:   06/26/20 1226 06/26/20 1241  BP: (!) 124/54 (!) 110/57  Pulse: 67 65  Resp: 15 20  Temp:  37.3 C  SpO2: 91% 100%    Last Pain:  Vitals:   06/26/20 1211  TempSrc:   PainSc: 0-No pain                 Ahyana Skillin DANIEL

## 2020-06-26 NOTE — Plan of Care (Signed)

## 2020-06-26 NOTE — Plan of Care (Signed)
Pt arrived to unit stable VS good, BP slightly elevated d/t pain and moving. Pt is currently resting well. Will continue to monitor.      Problem: Education: Goal: Knowledge of General Education information will improve Description: Including pain rating scale, medication(s)/side effects and non-pharmacologic comfort measures Outcome: Progressing   Problem: Activity: Goal: Risk for activity intolerance will decrease Outcome: Progressing   Problem: Pain Managment: Goal: General experience of comfort will improve Outcome: Progressing   Problem: Safety: Goal: Ability to remain free from injury will improve Outcome: Progressing   Problem: Skin Integrity: Goal: Risk for impaired skin integrity will decrease Outcome: Progressing

## 2020-06-26 NOTE — Progress Notes (Signed)
Spoke with patient's daughter with an update on delay.  Patient's daughter verbalized understanding.

## 2020-06-27 ENCOUNTER — Encounter (HOSPITAL_COMMUNITY): Payer: Self-pay | Admitting: Orthopedic Surgery

## 2020-06-27 ENCOUNTER — Inpatient Hospital Stay: Admission: RE | Admit: 2020-06-27 | Payer: Medicare HMO | Source: Ambulatory Visit

## 2020-06-27 LAB — CBC
HCT: 22 % — ABNORMAL LOW (ref 36.0–46.0)
Hemoglobin: 7.6 g/dL — ABNORMAL LOW (ref 12.0–15.0)
MCH: 31.7 pg (ref 26.0–34.0)
MCHC: 34.5 g/dL (ref 30.0–36.0)
MCV: 91.7 fL (ref 80.0–100.0)
Platelets: 125 10*3/uL — ABNORMAL LOW (ref 150–400)
RBC: 2.4 MIL/uL — ABNORMAL LOW (ref 3.87–5.11)
RDW: 12.7 % (ref 11.5–15.5)
WBC: 11.2 10*3/uL — ABNORMAL HIGH (ref 4.0–10.5)
nRBC: 0 % (ref 0.0–0.2)

## 2020-06-27 LAB — BASIC METABOLIC PANEL
Anion gap: 8 (ref 5–15)
BUN: 26 mg/dL — ABNORMAL HIGH (ref 8–23)
CO2: 26 mmol/L (ref 22–32)
Calcium: 8.2 mg/dL — ABNORMAL LOW (ref 8.9–10.3)
Chloride: 98 mmol/L (ref 98–111)
Creatinine, Ser: 1.44 mg/dL — ABNORMAL HIGH (ref 0.44–1.00)
GFR calc Af Amer: 37 mL/min — ABNORMAL LOW (ref >60)
GFR calc non Af Amer: 32 mL/min — ABNORMAL LOW (ref >60)
Glucose, Bld: 156 mg/dL — ABNORMAL HIGH (ref 70–99)
Potassium: 4.9 mmol/L (ref 3.5–5.1)
Sodium: 132 mmol/L — ABNORMAL LOW (ref 135–145)

## 2020-06-27 LAB — GLUCOSE, CAPILLARY
Glucose-Capillary: 152 mg/dL — ABNORMAL HIGH (ref 70–99)
Glucose-Capillary: 183 mg/dL — ABNORMAL HIGH (ref 70–99)
Glucose-Capillary: 197 mg/dL — ABNORMAL HIGH (ref 70–99)
Glucose-Capillary: 222 mg/dL — ABNORMAL HIGH (ref 70–99)
Glucose-Capillary: 243 mg/dL — ABNORMAL HIGH (ref 70–99)
Glucose-Capillary: 250 mg/dL — ABNORMAL HIGH (ref 70–99)

## 2020-06-27 NOTE — Progress Notes (Signed)
Inpatient Rehab Admissions Coordinator Note:   Per therapy recommendations, pt was screened for CIR candidacy by Shann Medal, PT, DPT.  Based on current insurance trends, feel that a request for prior auth for CIR would most likely be denied.  We will not pursue a consult at this time.  Please contact me with questions.   Shann Medal, PT, DPT [phone] 06/27/20 2:21 PM

## 2020-06-27 NOTE — Plan of Care (Signed)
  Problem: Education: Goal: Knowledge of General Education information will improve Description Including pain rating scale, medication(s)/side effects and non-pharmacologic comfort measures Outcome: Progressing   Problem: Health Behavior/Discharge Planning: Goal: Ability to manage health-related needs will improve Outcome: Progressing   

## 2020-06-27 NOTE — Progress Notes (Signed)
Nutrition Follow-up  DOCUMENTATION CODES:   Not applicable  INTERVENTION:   -Continue Glucerna Shake po BID, each supplement provides 220 kcal and 10 grams of protein -Continue MVI with minerals daily  NUTRITION DIAGNOSIS:   Increased nutrient needs related to post-op healing as evidenced by estimated needs.  Ongoing  GOAL:   Patient will meet greater than or equal to 90% of their needs  Progressing   MONITOR:   PO intake, Supplement acceptance, Diet advancement, Labs, Skin, I & O's, Weight trends  REASON FOR ASSESSMENT:   Consult Hip fracture protocol  ASSESSMENT:   Vanessa Gibson is a 86 y.o. female who complains of  Pain to the right hip. She lives alone in a retirement community. Yesterday she states that she fell while she was trying to get up off of the couch.  8/3- s/p PROCEDURE:  Intramedullary nailing of the right hip using a [employer] Affixus nail 11 x 340 statically locked.  Reviewed I/O's: -100 ml x 24 hours and -400 ml since admission  UOP: 400 ml x 24 hours  Spoke with pt, who was sitting in recliner chair at time of visit. Pt not very talkative at time of visit, was agitated about having to sit in the chair. She did not want t answer RD questions.   Pt with good appetite; noted meal completion 75%. Pt has been consuming Glucerna shakes.   Per TOC notes, pt prefers to d/c with home health services.   Medications reviewed and include colace.   Labs reviewed: Na: 132, CBGS: 152 (inpatient orders for glycemic control are 0-5 units insulin aspart daily at bedtime and 0-9 units insulin aspart TID with meals).   NUTRITION - FOCUSED PHYSICAL EXAM:    Most Recent Value  Orbital Region No depletion  Upper Arm Region Mild depletion  Thoracic and Lumbar Region No depletion  Buccal Region No depletion  Temple Region No depletion  Clavicle Bone Region Unable to assess  Clavicle and Acromion Bone Region Unable to assess  Scapular Bone Region Unable to assess   Dorsal Hand Mild depletion  Patellar Region No depletion  Anterior Thigh Region No depletion  Posterior Calf Region No depletion  Edema (RD Assessment) Mild  Hair Reviewed  Eyes Reviewed  Mouth Reviewed  Skin Reviewed  Nails Reviewed       Diet Order:   Diet Order            Diet Carb Modified Fluid consistency: Thin; Room service appropriate? Yes  Diet effective now                 EDUCATION NEEDS:   No education needs have been identified at this time  Skin:  Skin Assessment: Skin Integrity Issues: Skin Integrity Issues:: Other (Comment), Incisions Incisions: closed rt hip Other: lt neck laceration  Last BM:  Unknown  Height:   Ht Readings from Last 1 Encounters:  06/25/20 5' (1.524 m)    Weight:   Wt Readings from Last 1 Encounters:  06/25/20 60.3 kg    Ideal Body Weight:  45.5 kg  BMI:  Body mass index is 25.97 kg/m.  Estimated Nutritional Needs:   Kcal:  1600-1800  Protein:  75-90 grams  Fluid:  > 1.6 L    Loistine Chance, RD, LDN, [place] Registered Dietitian II Certified Diabetes Care and Education Specialist Please refer to [facility] for RD and/or RD on-call/weekend/after hours pager

## 2020-06-27 NOTE — Evaluation (Signed)
Physical Therapy Evaluation Patient Details Name: Vanessa Gibson MRN: [id number] DOB: Dec 04, 1927 Today's Date: 06/27/2020   History of Present Illness  Pt is an 86 y.o. female admitted 06/25/20 after fall sustaining R femoral neck fx, L neck laceration. S/p R hip IMN 8/3. PMH includes DM2, HTN, CKD 3, breast CX, OA, MI, falls vertigo-related(?).    Clinical Impression  Pt presents with an overall decrease in functional mobility secondary to above. PTA, pt independent, lives at [facility]. Educ on precautions, positioning, therex, and importance of mobility. Today, pt able to initiate transfer and gait training with RW and modA; limited by post-op pain and weakness as well as decreased activity tolerance. Pt motivated to participate and regain PLOF. Would benefit from intensive CIR-level therapies to maximize functional mobility and independence prior to return home.    Follow Up Recommendations CIR;Supervision/Assistance - 24 hour    Equipment Recommendations   (TBD)    Recommendations for Other Services       Precautions / Restrictions Precautions Precautions: Fall Restrictions Weight Bearing Restrictions: Yes RLE Weight Bearing: Weight bearing as tolerated      Mobility  Bed Mobility Overal bed mobility: Needs Assistance Bed Mobility: Supine to Sit     Supine to sit: Mod assist     General bed mobility comments: ModA for RLE management to EOB, assist for trunk elevation; pt limited by R hip pain  Transfers Overall transfer level: Needs assistance Equipment used: Rolling walker (2 wheeled) Transfers: Sit to/from Stand Sit to Stand: Mod assist         General transfer comment: ModA for trunk elevation standing from bed and recliner to RW, assist to steady RW, cues for hand placement  Ambulation/Gait Ambulation/Gait assistance: Mod assist Gait Distance (Feet): 2 Feet Assistive device: Rolling walker (2 wheeled) Gait Pattern/deviations: Step-to  pattern;Antalgic;Decreased weight shift to right;Trunk flexed;Leaning posteriorly Gait velocity: Decreased   General Gait Details: Pivotal steps to recliner with RW and modA for stability, seated rest then additional steps forwards/backwards; pt with difficulty accepting WBAT onto RLE to take complete step with L foot. Able to clear both feet off floor  Stairs            Wheelchair Mobility    Modified Rankin (Stroke Patients Only)       Balance Overall balance assessment: Needs assistance   Sitting balance-Leahy Scale: Fair       Standing balance-Leahy Scale: Poor Standing balance comment: Reliant on UE support and external assist                             Pertinent Vitals/Pain Pain Assessment: Faces Faces Pain Scale: Hurts even more Pain Location: R hip Pain Descriptors / Indicators: Grimacing;Guarding;Sore Pain Intervention(s): Limited activity within patient's tolerance;Repositioned    Home Living Family/patient expects to be discharged to:: Assisted living               Home Equipment: Walker - standard;Shower seat;Grab bars - tub/shower Additional Comments: Pt resides in apartment at [place]    Prior Function Level of Independence: Independent         Comments: Independent ambulating without DME, gets to/from dining hall. No assistance provided at ILF, pt endorses sitting for shower, but "it's scary, I've had a fall but the call bell is far away." Grew up in [place], enjoys dancing and listening to music     Hand Dominance  Extremity/Trunk Assessment   Upper Extremity Assessment Upper Extremity Assessment: Generalized weakness    Lower Extremity Assessment Lower Extremity Assessment: Generalized weakness;RLE deficits/detail RLE Deficits / Details: s/p R hip IMN; hip knee/flex at least 3/5, hip flex limited by post-op pain RLE: Unable to fully assess due to pain RLE Coordination: decreased gross motor        Communication   Communication: HOH  Cognition Arousal/Alertness: Awake/alert Behavior During Therapy: WFL for tasks assessed/performed Overall Cognitive Status: Within Functional Limits for tasks assessed                                 General Comments: WFL for simple tasks, not formally assessed      General Comments General comments (skin integrity, edema, etc.): C/o dizziness after initial stand with BP 141/59; pt denies dizziness on second standing trial    Exercises General Exercises - Lower Extremity Ankle Circles/Pumps: AROM;Both;Seated Long Arc Quad: AROM;Right;Seated Hip Flexion/Marching: AROM;Both;Standing   Assessment/Plan    PT Assessment Patient needs continued PT services  PT Problem List Decreased strength;Decreased range of motion;Decreased activity tolerance;Decreased balance;Decreased mobility;Decreased knowledge of use of DME;Pain;Decreased knowledge of precautions       PT Treatment Interventions DME instruction;Gait training;Stair training;Functional mobility training;Therapeutic activities;Therapeutic exercise;Balance training;Patient/family education    PT Goals (Current goals can be found in the Care Plan section)  Acute Rehab PT Goals Patient Stated Goal: Recognizes she is not safe to function at home without assist, but unsure about post-acute rehab PT Goal Formulation: With patient Time For Goal Achievement: 07/11/20 Potential to Achieve Goals: Good    Frequency Min 5X/week   Barriers to discharge Decreased caregiver support      Co-evaluation               AM-PAC PT "6 Clicks" Mobility  Outcome Measure Help needed turning from your back to your side while in a flat bed without using bedrails?: A Lot Help needed moving from lying on your back to sitting on the side of a flat bed without using bedrails?: A Lot Help needed moving to and from a bed to a chair (including a wheelchair)?: A Lot Help needed standing up from a  chair using your arms (e.g., wheelchair or bedside chair)?: A Lot Help needed to walk in hospital room?: A Lot Help needed climbing 3-5 steps with a railing? : A Lot 6 Click Score: 12    End of Session Equipment Utilized During Treatment: Gait belt Activity Tolerance: Patient tolerated treatment well;Patient limited by pain Patient left: in chair;with call bell/phone within reach;with chair alarm set Nurse Communication: Mobility status PT Visit Diagnosis: Unsteadiness on feet (R26.81);Other abnormalities of gait and mobility (R26.89);Pain Pain - Right/Left: Right Pain - part of body: Hip    Time: 1206-1237 PT Time Calculation (min) (ACUTE ONLY): 31 min   Charges:   PT Evaluation $PT Eval Moderate Complexity: 1 Mod PT Treatments $Therapeutic Activity: 8-22 mins   Mabeline Caras, PT, DPT Acute Rehabilitation Services  Pager [phone] Office [place] 06/27/2020, 1:04 PM

## 2020-06-27 NOTE — NC FL2 (Signed)
[place] MEDICAID FL2 LEVEL OF CARE SCREENING TOOL     IDENTIFICATION  Patient Name: Vanessa Gibson Birthdate: 12-17-1927 Sex: female Admission Date (Current Location): 06/25/2020  [facility] [place] Number:  Herbalist and Address:  [facility] [place], [place] [address] [place], [place] [address]      Provider Number: [id number]  Attending Physician Name and Address:  Little Ishikawa, MD  Relative Name and Phone Number:  Saisha Hogue - [phone]    Current Level of Care: Hospital Recommended Level of Care: [place] Prior Approval Number:    Date Approved/Denied:   PASRR Number: [id number]  Discharge Plan: SNF    Current Diagnoses: Patient Active Problem List   Diagnosis Date Noted  . Closed displaced fracture of right femoral neck ([place]) 06/25/2020  . Exudative age-related macular degeneration of right eye with active choroidal neovascularization ([place]) 04/05/2020  . Exudative age-related macular degeneration of left eye with active choroidal neovascularization ([place]) 04/05/2020  . Intermediate stage nonexudative age-related macular degeneration of left eye 04/05/2020  . Retinal pigment epithelium serous detachment, right 04/05/2020  . Bilateral hand numbness 10/01/2018  . Neck pain 10/01/2018  . Bilateral lower extremity edema 06/18/2018  . Primary osteoarthritis of both knees 06/18/2018  . Arthralgia of both hands 06/18/2018  . Chronic cough 06/18/2018  . Hernia of abdominal wall 06/18/2018  . ILD (interstitial lung disease) ([place]) 04/05/2018  . Depression, recurrent ([place]), unhappy in [place] but feels stuck, declines medication or counseling 01/24/2018  . At risk for polypharmacy, due to vitamin overuse, working with patient to "deprescribe" 01/24/2018  . Constipation, uses Miralax prn 01/24/2018  . Diabetic peripheral neuropathy associated with type 2 diabetes mellitus ([place]), unable to tolerate Neurontin 01/24/2018  . Aortic  atherosclerosis ([place]), 2018 CXR 01/24/2018  . Lumbar spine scoliosis 01/24/2018  . Hyperlipidemia associated with type 2 diabetes mellitus ([place]), on statin 01/24/2018  . IBS (irritable bowel syndrome) 01/24/2018  . Uncontrolled type 2 diabetes mellitus with hyperglycemia, with long-term current use of insulin ([place]) 12/02/2017  . Insomnia 06/06/2017  . CKD stage 3 secondary to diabetes ([place]), on Lisinopril 05/26/2017  . Osteoporosis, post-menopausal 02/06/2016  . Bilateral hearing loss, refuses hearing aids or further testing 02/06/2016  . Arthritis, takes Tumeric  02/06/2016  . Anemia of chronic disease 12/05/2015  . Protein-calorie malnutrition, weight stable 11/13/2015  . Chronic pancreatitis ([place]) 11/13/2015  . GERD (gastroesophageal reflux disease), on Nexium 11/13/2015  . Hypertension associated with diabetes ([place]), on Lisinopril 11/13/2015  . Allergic rhinitis, Rx Xyzal, flonase, atrovent and has been offered referral to Allergy 11/13/2015    Orientation RESPIRATION BLADDER Height & Weight     Self, Time, Situation, Place  Normal Continent Weight: 60.3 kg Height:  5' (152.4 cm)  BEHAVIORAL SYMPTOMS/MOOD NEUROLOGICAL BOWEL NUTRITION STATUS      Continent Diet (See Discharge Summary)  AMBULATORY STATUS COMMUNICATION OF NEEDS Skin   Extensive Assist Verbally Surgical wounds                       Personal Care Assistance Level of Assistance  Bathing, Dressing Bathing Assistance: Limited assistance   Dressing Assistance: Limited assistance     Functional Limitations Info  Sight, Hearing, Speech Sight Info: Adequate Hearing Info: Impaired Speech Info: Adequate    SPECIAL CARE FACTORS FREQUENCY  PT (By licensed PT), OT (By licensed OT)     PT Frequency: 5 x per week OT Frequency: 5 x per week  Contractures Contractures Info: Not present    Additional Factors Info  Allergies, Code Status, Psychotropic, Insulin Sliding Scale Code Status Info:  Full Allergies Info: NKDA Psychotropic Info: Lyrica, Celexa, Ativan Insulin Sliding Scale Info: See Discharge Summary       Current Medications (06/27/2020):  This is the current hospital active medication list Current Facility-Administered Medications  Medication Dose Route Frequency Provider Last Rate Last Admin  . 0.9 %  sodium chloride infusion   Intravenous Continuous Altamese [place], MD 75 mL/hr at 06/27/20 0327 New Bag at 06/27/20 0327  . amLODipine (NORVASC) tablet 5 mg  5 mg Oral Daily Altamese Momence, MD   5 mg at 06/27/20 0936  . aspirin EC tablet 81 mg  81 mg Oral Daily Altamese [place], MD   81 mg at 06/27/20 0936  . citalopram (CELEXA) tablet 20 mg  20 mg Oral Daily Altamese [place], MD   20 mg at 06/27/20 0936  . docusate sodium (COLACE) capsule 100 mg  100 mg Oral BID Altamese [place], MD   100 mg at 06/27/20 0936  . enoxaparin (LOVENOX) injection 30 mg  30 mg Subcutaneous Q24H Altamese [place], MD   30 mg at 06/27/20 0931  . feeding supplement (GLUCERNA SHAKE) (GLUCERNA SHAKE) liquid 237 mL  237 mL Oral BID BM Altamese [place], MD   237 mL at 06/27/20 0936  . fluticasone (FLONASE) 50 MCG/ACT nasal spray 2 spray  2 spray Each Nare Daily Altamese [place], MD   2 spray at 06/27/20 [phone]  . HYDROcodone-acetaminophen (NORCO/VICODIN) 5-325 MG per tablet 1-2 tablet  1-2 tablet Oral Q6H PRN Altamese [place], MD   2 tablet at 06/25/20 2310  . insulin aspart (novoLOG) injection 0-5 Units  0-5 Units Subcutaneous QHS Lang Snow, FNP   3 Units at 06/26/20 2327  . insulin aspart (novoLOG) injection 0-9 Units  0-9 Units Subcutaneous TID WC Nita Sells, MD   3 Units at 06/27/20 1225  . loratadine (CLARITIN) tablet 5 mg  5 mg Oral Daily [place]e Jude, RPH   5 mg at 06/26/20 1447  . LORazepam (ATIVAN) tablet 0.5 mg  0.5 mg Oral QHS Altamese [place], MD   0.5 mg at 06/26/20 2130  . metoprolol tartrate (LOPRESSOR) tablet 12.5 mg  12.5 mg Oral BID Altamese [place], MD   12.5 mg at 06/27/20 0935  .  montelukast (SINGULAIR) tablet 10 mg  10 mg Oral Faith Rogue, MD   10 mg at 06/26/20 2130  . morphine 2 MG/ML injection 0.5 mg  0.5 mg Intravenous Q2H PRN Altamese [place], MD      . multivitamin with minerals tablet 1 tablet  1 tablet Oral Daily Altamese [place], MD   1 tablet at 06/27/20 0936  . mupirocin ointment (BACTROBAN) 2 % 1 application  1 application Topical BID Altamese [place], MD   1 application at 54/62/70 [phone]  . olopatadine (PATANOL) 0.1 % ophthalmic solution 1 drop  1 drop Both Eyes BID Altamese [place], MD   1 drop at 06/27/20 0937  . ondansetron (ZOFRAN) tablet 4 mg  4 mg Oral Q6H PRN Altamese [place], MD       Or  . ondansetron [place]) injection 4 mg  4 mg Intravenous Q6H PRN Altamese [place], MD      . oxybutynin (DITROPAN-XL) 24 hr tablet 10 mg  10 mg Oral Faith Rogue, MD   10 mg at 06/26/20 2132  . pantoprazole (PROTONIX) EC tablet 40 mg  40 mg Oral Daily Altamese [place], MD  40 mg at 06/27/20 0935  . pravastatin (PRAVACHOL) tablet 20 mg  20 mg Oral q1800 Altamese [place]on, MD   20 mg at 06/26/20 1812  . pregabalin (LYRICA) capsule 25 mg  25 mg Oral QPC supper Altamese [place], MD   25 mg at 06/26/20 1812  . traMADol (ULTRAM) tablet 50 mg  50 mg Oral QHS Altamese [place], MD   50 mg at 06/26/20 2129     Discharge Medications: Please see discharge summary for a list of discharge medications.  Relevant Imaging Results:  Relevant Lab Results:   Additional Information SS# [id number]  Curlene Labrum, RN

## 2020-06-27 NOTE — Evaluation (Signed)
Occupational Therapy Evaluation Patient Details Name: Vanessa Gibson MRN: [id number] DOB: [birth year] Today's Date: 06/27/2020    History of Present Illness Pt is an 86 y.o. female admitted 06/25/20 after fall sustaining R femoral neck fx, L neck laceration. S/p R hip IMN 8/3. PMH includes DM2, HTN, CKD 3, breast CX, OA, MI, falls vertigo-related(?).   Clinical Impression   Vanessa Gibson is an 86 year old woman s/p IM nailing of right femoral neck fracture with decreased ROM and strength of right lower extremity, generalized weakness, complaints of pain in arms, chest, back and hip, and decreased activity tolerance resulting in significant decline in ability to perform functional mobility and ADLs. Patient able to perform UB ADLs sitting at side of bed but max assist for toileting and total assist for lower body dressing. Patient limited by pain and unable to mobilize more than 4 feet away from bed before wanting to return to bed. Patient will benefit from skilled OT services to improve deficits and learn compensatory strategies as needed in order to improve independence and return to PLOF. Patient will need short term rehab at discharge.  Patient requested Ice to inner and outer leg and therapist placed ice packs for pain and edema management. Patient thankful.    Follow Up Recommendations  SNF    Equipment Recommendations  Other (comment) (Defer to next venue)    Recommendations for Other Services       Precautions / Restrictions Precautions Precautions: Fall Restrictions Weight Bearing Restrictions: No RLE Weight Bearing: Weight bearing as tolerated      Mobility Bed Mobility Overal bed mobility: Needs Assistance Bed Mobility: Supine to Sit     Supine to sit: Mod assist     General bed mobility comments: ModA for RLE management to EOB, assist for trunk elevation; pt limited by R hip pain  Transfers Overall transfer level: Needs assistance Equipment used: Rolling walker (2  wheeled) Transfers: Sit to/from Stand Sit to Stand: Min assist;From elevated surface         General transfer comment: Min assist to stand from elevated bed height with use oF RW. Verbal cues for technique. Patient ambulated approx 4 feet forward and then back to bed. Limited by pain in right hip and also reporting pain in chest, arms and back limiting use of RW.    Balance Overall balance assessment: Needs assistance Sitting-balance support: No upper extremity supported;Feet unsupported Sitting balance-Leahy Scale: Fair     Standing balance support: During functional activity;Bilateral upper extremity supported Standing balance-Leahy Scale: Poor Standing balance comment: Reliant on UE support on walker                           ADL either performed or assessed with clinical judgement   ADL Overall ADL's : Needs assistance/impaired Eating/Feeding: Independent   Grooming: Set up;Sitting   Upper Body Bathing: Sitting;Set up   Lower Body Bathing: Maximal assistance;Set up;Sit to/from stand   Upper Body Dressing : Set up;Sitting   Lower Body Dressing: Sit to/from stand;Total assistance   Toilet Transfer: BSC;RW;Stand-pivot   Toileting- Clothing Manipulation and Hygiene: Maximal assistance;Sit to/from stand       Functional mobility during ADLs: Minimal assistance;Rolling walker       Vision   Vision Assessment?: No apparent visual deficits     Perception     Praxis      Pertinent Vitals/Pain Pain Assessment: Faces Faces Pain Scale: Hurts little more Pain Location: R  hip, reports pain in chest and back from fall Pain Descriptors / Indicators: Grimacing;Guarding;Sore     Hand Dominance     Extremity/Trunk Assessment Upper Extremity Assessment Upper Extremity Assessment: Overall WFL for tasks assessed;RUE deficits/detail;LUE deficits/detail RUE Deficits / Details: Shoulder: 3+/5 (referred pain, elbow 4-/5, wrist 5/5, grip 5/5 LUE Deficits /  Details: 4/4, elbow 4/5, wrist 5/5, grip 5/5   Lower Extremity Assessment Lower Extremity Assessment: Defer to PT evaluation RLE Deficits / Details: s/p R hip IMN; hip knee/flex at least 3/5, hip flex limited by post-op pain       Communication Communication Communication: HOH   Cognition Arousal/Alertness: Awake/alert Behavior During Therapy: WFL for tasks assessed/performed Overall Cognitive Status: Within Functional Limits for tasks assessed                                     General Comments  C/o dizziness after initial stand with BP 141/59; pt denies dizziness on second standing trial    Exercises General Exercises - Lower Extremity Ankle Circles/Pumps: AROM;Both;Seated Long Arc Quad: AROM;Right;Seated Hip Flexion/Marching: AROM;Both;Standing   Shoulder Instructions      Home Living Family/patient expects to be discharged to:: Assisted living                             Home Equipment: Walker - standard;Shower seat;Grab bars - tub/shower   Additional Comments: Pt resides in apartment at [place]. Walk in shower.      Prior Functioning/Environment Level of Independence: Independent        Comments: Independent ambulating without DME, gets to/from dining hall. No assistance provided at ILF, pt endorses sitting for shower, but "it's scary, I've had a fall but the call bell is far away." Grew up in [place], enjoys dancing and listening to music        OT Problem List: Decreased strength;Decreased range of motion;Decreased activity tolerance;Impaired balance (sitting and/or standing);Decreased knowledge of use of DME or AE;Pain      OT Treatment/Interventions: Self-care/ADL training;Therapeutic exercise;DME and/or AE instruction;Patient/family education;Therapeutic activities;Balance training    OT Goals(Current goals can be found in the care plan section) Acute Rehab OT Goals Patient Stated Goal: Improve independence OT Goal  Formulation: With patient Time For Goal Achievement: 07/11/20 Potential to Achieve Goals: Good  OT Frequency: Min 2X/week   Barriers to D/C:            Co-evaluation              AM-PAC OT "6 Clicks" Daily Activity     Outcome Measure Help from another person eating meals?: None Help from another person taking care of personal grooming?: A Little Help from another person toileting, which includes using toliet, bedpan, or urinal?: A Lot Help from another person bathing (including washing, rinsing, drying)?: A Lot Help from another person to put on and taking off regular upper body clothing?: A Little Help from another person to put on and taking off regular lower body clothing?: Total 6 Click Score: 15   End of Session Equipment Utilized During Treatment: Rolling walker;Gait belt Nurse Communication:  (okay to see per RN)  Activity Tolerance: Patient limited by pain Patient left: in chair;with call bell/phone within reach  OT Visit Diagnosis: Other abnormalities of gait and mobility (R26.89);Unsteadiness on feet (R26.81);Muscle weakness (generalized) (M62.81);Pain Pain - Right/Left: Right Pain - part of  body: Hip                Time: 7185-5015 OT Time Calculation (min): 23 min Charges:  OT General Charges $OT Visit: 1 Visit OT Evaluation $OT Eval Low Complexity: 1 Low  Shenee Wignall, OTR/[facility] [place]  Office [phone] Pager: [place] 06/27/2020, 4:58 PM

## 2020-06-27 NOTE — TOC Initial Note (Signed)
Transition of Care [facility]) - Initial/Assessment Note    Patient Details  Name: Vanessa Gibson MRN: [id number] Date of Birth: [birth year]  Transition of Care [facility]) CM/SW Contact:    Curlene Labrum, RN Phone Number: 06/27/2020, 10:14 AM  Clinical Narrative:                 Case management met with the patient regarding transitions to home S/P Right femoral neck fracture after mechanical fall at home.  The patient lives at home alone at [place] and states that she does not want to go to a SNF facility.  I explained to the patient that she would need 24-hour supervision at home after her surgery and she stated that "I can hire someone".  I am waiting on PT eval to establish patient's needs.  I spoke with [facility] = Philippa Sicks with [facility] and she stated that she would be able to help establish a non nursing aide to stay with the patient if needed if CIR is not an option.   I will speak with the daughter today along with the patient after PT evaluates the patient.  Expected Discharge Plan: [place] (Inpatient Rehab versus home with home health if patient is able to establish 24 hour supervision with family and/or [facility]) Barriers to Discharge: Continued Medical Work up   Patient Goals and CMS Choice Patient states their goals for this hospitalization and ongoing recovery are:: Patient wants to go home with services and not be admitted to a Rehab SNF CMS Medicare.gov Compare Post Acute Care list provided to:: Patient Choice offered to / list presented to : Patient  Expected Discharge Plan and Services Expected Discharge Plan: [place] (Inpatient Rehab versus home with home health if patient is able to establish 24 hour supervision with family and/or [place])   Discharge Planning Services: CM Consult Post Acute Care Choice: IP Rehab, Home Health ([place] versus IP Rehab) Living arrangements for the past 2 months:  [place] ([place])                                      Prior Living Arrangements/Services Living arrangements for the past 2 months: [place] ([place])   Patient language and need for interpreter reviewed:: Yes Do you feel safe going back to the place where you live?: No   Patient lives alone in [place] at [place]  Need for Family Participation in Patient Care: Yes (Comment) Care giver support system in place?: Yes (comment) Current home services: DME Criminal Activity/Legal Involvement Pertinent to Current Situation/Hospitalization: No - Comment as needed  Activities of Daily Living Home Assistive Devices/Equipment: Gilford Rile (specify type) ADL Screening (condition at time of admission) Patient's cognitive ability adequate to safely complete daily activities?: Yes Is the patient deaf or have difficulty hearing?: Yes Does the patient have difficulty seeing, even when wearing glasses/contacts?: No Does the patient have difficulty concentrating, remembering, or making decisions?: No Patient able to express need for assistance with ADLs?: No Does the patient have difficulty dressing or bathing?: Yes Independently performs ADLs?: No Does the patient have difficulty walking or climbing stairs?: Yes Weakness of Legs: Both Weakness of Arms/Hands: None  Permission Sought/Granted Permission sought to share information with : Case Manager Permission granted to share information with : Yes, Verbal Permission Granted  Permission granted to share info w AGENCY: [place] granted to share info w Relationship: daughter, [name]     Emotional Assessment Appearance:: Appears stated age Attitude/Demeanor/Rapport: Gracious Affect (typically observed): Accepting, Pleasant Orientation: : Oriented to Self, Oriented to Place, Oriented to  Time, Oriented to Situation Alcohol / Substance Use: Not  Applicable Psych Involvement: No (comment)  Admission diagnosis:  Fall [W19.XXXA] Left displaced femoral neck fracture ([place]) [S72.002A] Fall, initial encounter [W19.XXXA] Closed displaced fracture of right femoral neck ([place]) [S72.001A] Patient Active Problem List   Diagnosis Date Noted  . Closed displaced fracture of right femoral neck ([place]) 06/25/2020  . Exudative age-related macular degeneration of right eye with active choroidal neovascularization ([place]) 04/05/2020  . Exudative age-related macular degeneration of left eye with active choroidal neovascularization ([place]) 04/05/2020  . Intermediate stage nonexudative age-related macular degeneration of left eye 04/05/2020  . Retinal pigment epithelium serous detachment, right 04/05/2020  . Bilateral hand numbness 10/01/2018  . Neck pain 10/01/2018  . Bilateral lower extremity edema 06/18/2018  . Primary osteoarthritis of both knees 06/18/2018  . Arthralgia of both hands 06/18/2018  . Chronic cough 06/18/2018  . Hernia of abdominal wall 06/18/2018  . ILD (interstitial lung disease) ([place]) 04/05/2018  . Depression, recurrent ([place]), unhappy in [place] but feels stuck, declines medication or counseling 01/24/2018  . At risk for polypharmacy, due to vitamin overuse, working with patient to "deprescribe" 01/24/2018  . Constipation, uses Miralax prn 01/24/2018  . Diabetic peripheral neuropathy associated with type 2 diabetes mellitus ([place]), unable to tolerate Neurontin 01/24/2018  . Aortic atherosclerosis ([place]), 2018 CXR 01/24/2018  . Lumbar spine scoliosis 01/24/2018  . Hyperlipidemia associated with type 2 diabetes mellitus ([place]), on statin 01/24/2018  . IBS (irritable bowel syndrome) 01/24/2018  . Uncontrolled type 2 diabetes mellitus with hyperglycemia, with long-term current use of insulin ([place]) 12/02/2017  . Insomnia 06/06/2017  . CKD stage 3 secondary to diabetes ([place]), on Lisinopril 05/26/2017  . Osteoporosis, post-menopausal 02/06/2016  .  Bilateral hearing loss, refuses hearing aids or further testing 02/06/2016  . Arthritis, takes Tumeric  02/06/2016  . Anemia of chronic disease 12/05/2015  . Protein-calorie malnutrition, weight stable 11/13/2015  . Chronic pancreatitis ([place]) 11/13/2015  . GERD (gastroesophageal reflux disease), on Nexium 11/13/2015  . Hypertension associated with diabetes ([place]), on Lisinopril 11/13/2015  . Allergic rhinitis, Rx Xyzal, flonase, atrovent and has been offered referral to Allergy 11/13/2015   PCP:  Tammi Sou, MD Pharmacy:   [facility], [address] [place] [address] Phone: [phone] Fax: [place] [facility] [place], [address] [place] [address] Phone: [phone] Fax: [phone]     Social Determinants of Health (SDOH) Interventions    Readmission Risk Interventions Readmission Risk Prevention Plan 06/27/2020  Transportation Screening Complete  PCP or Specialist Appt within 5-7 Days Complete  Home Care Screening Complete  Medication Review (RN CM) Complete  Some recent data might be hidden

## 2020-06-27 NOTE — Progress Notes (Signed)
Dr. Avon Gully notified that patient is complaining of chest pain.  Patient states that she experiences this same pain intermittently at home.  No signs of distress.  Order received for stat EKG.

## 2020-06-27 NOTE — Progress Notes (Signed)
PROGRESS NOTE    Vanessa Gibson  ELF:[id number] DOB: December 01, 1927 DOA: 06/25/2020 PCP: Vanessa Sou, MD  Brief Narrative:  85 white female born in [place] originally from [place] living [facility] Unwitnessed fall with right hip fracture laceration left jaw pressure bandage was placed received fentanyl MI 1999 HLD chronic vertigo, chronic pancreatitis?  Nonalcoholic ?  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  Vertigo related the placed 30-day Holter monitor at the time Recent visit to primary care physician 06/15/2020-major addressed issues at the time major depression chronic fatigue-had seen dermatology with 2 lesions removed from head and neck mid July --- At that visit increase Ditropan XL to 10 mg daily  Assessment & Plan:  Principal Problem:   Closed displaced fracture of right femoral neck (HCC) Active Problems:   Hypertension associated with diabetes ([place]), on Lisinopril   CKD stage 3 secondary to diabetes ([place]), on Lisinopril   Uncontrolled type 2 diabetes mellitus with hyperglycemia, with long-term current use of insulin ([place])   Acute right hip fracture with ambulatory dysfunction, likely mechanical fall - ORIF per orthopedic surgery, DVT prophylaxis and pain management per their team as well, appreciate insight and recommendations - PT following for disposition, recommending SNF however patient is requesting discharge home back to ALF - Advance diet as tolerated  Hypertension - Continue amlodipine 5, metoprolol 12.5 twice daily - Pressures are well controlled at this time  Mild neck laceration without any complication - This is from a dermatological procedure which had scarred over and then seem to open up after her fall - If she goes to skilled facility this can be removed in 7 days, if not, ALF can arrange for PCP to see in 5 to 7 days to remove the same  Mild  periop anemia and leukemoid reaction CBC Latest Ref Rng & Units 06/27/2020 06/26/2020 06/25/2020  WBC 4.0 - 10.5 K/uL 11.2(H) 14.0(H) 10.4  Hemoglobin 12.0 - 15.0 g/dL 7.6(L) 10.5(L) 11.7(L)  Hematocrit 36 - 46 % 22.0(L) 31.8(L) 35.4(L)  Platelets 150 - 400 K/uL 125(L) 231 257   CAD w/ history of MI 1999 - Empiric aspirin to continue-no further info available  DM 2 with complication of neuropathy, poorly controlled - On Tresiba which is non-formulary - Continue sliding scale insulin hypoglycemic protocol Lab Results  Component Value Date   HGBA1C 6.4 (A) 06/04/2020  - Continue Lyrica for neuropathy  Major depressive disorder - Continue Celexa 20, Ativan 0.5 at bedtime - Consider de-escalation off of Ativan in the outpatient setting if possible  Chronic vertigo Questionable pancreatitis - ruled out Prior DCIS age 64 Adrenal incidentaloma   DVT prophylaxis: SCDs as per orthopedics Code Status: Full Family Communication: Vanessa Gibson updated over the phone - agreeable to CIR/SNF  Disposition:  Status is: Inpatient Remains inpatient appropriate because:Ongoing active pain requiring inpatient pain management Dispo: The patient is from: Home              Anticipated d/c is to: CIR              Anticipated d/c date is: 2 days              Patient currently is not medically stable to d/c.   Consultants:  Orthopedics  Procedures:  ORIF Right Hip  Antimicrobials:  Perioperative as per Ortho  Subjective: No acute issues or events overnight, patient has multiple questions about discharge planning payment and  physical therapy, however upon further discussion she then cuts me off and tells me to discuss this with her son.  Unclear if this is patient's baseline, regardless she denies nausea, vomiting, diarrhea, constipation, headache, fevers, chills.  Objective: Vitals:   06/26/20 2013 06/26/20 2130 06/27/20 0226 06/27/20 0736  BP: (!) 125/52 135/71 (!) 117/59 134/61  Pulse: 78 70 75 80    Resp: 17  17   Temp: 99.9 F (37.7 C)  (!) 97.5 F (36.4 C) 98.7 F (37.1 C)  TempSrc: Oral  Oral Oral  SpO2: 94%  96% 96%  Weight:      Height:        Intake/Output Summary (Last 24 hours) at 06/27/2020 9629 Last data filed at 06/27/2020 0500 Gross per 24 hour  Intake 400 ml  Output 500 ml  Net -100 ml   Filed Weights   06/25/20 2012  Weight: 60.3 kg    Examination:  General exam: Pleasant alert oriented no distress EOMI NCAT Left neck has sutures in place from laceration mild next laceration Respiratory system: Clear no added sound Cardiovascular system: S1-S2 no murmur rub or gallop Gastrointestinal system: Soft nontender no rebound. Central nervous system: Intact no focal deficit Extremities: No lower extremity edema Skin: Wound to left neck as above Psychiatry: Euthymic pleasant  Data Reviewed: I have personally reviewed following labs and imaging studies Sodium 131 BUN/creatinine to 23/1.2 which is baseline Hemoglobin 10.5, WBC 14  Radiology Studies: DG Chest 1 View  Result Date: 06/25/2020 CLINICAL DATA:  86 year old female with fall and right hip pain. EXAM: CHEST  1 VIEW COMPARISON:  Chest radiograph dated 04/20/2018. FINDINGS: No focal consolidation, pleural effusion, or pneumothorax. There is mild cardiomegaly. Atherosclerotic calcification of the aorta. No acute osseous pathology. IMPRESSION: No acute cardiopulmonary process. Electronically Signed   By: Anner Crete M.D.   On: 06/25/2020 21:07   CT Angio Neck W and/or Wo Contrast  Result Date: 06/25/2020 CLINICAL DATA:  Moderate to severe neck trauma. Laceration to both sides of the neck. Fall. EXAM: CT ANGIOGRAPHY NECK TECHNIQUE: Multidetector CT imaging of the neck was performed using the standard protocol during bolus administration of intravenous contrast. Multiplanar CT image reconstructions and MIPs were obtained to evaluate the vascular anatomy. Carotid stenosis measurements (when applicable) are  obtained utilizing NASCET criteria, using the distal internal carotid diameter as the denominator. CONTRAST:  45mL OMNIPAQUE IOHEXOL 350 MG/ML SOLN COMPARISON:  None. FINDINGS: Aortic arch: The left common carotid artery and innominate artery share a common origin. Minimal atherosclerotic changes present at the origin of the left subclavian artery. Right carotid system: Right common carotid artery is within normal limits. Bifurcation is unremarkable. Cervical right ICA is normal. Intracranial atherosclerotic changes are present without significant stenosis. Left carotid system: The left common carotid artery is within normal limits. Bifurcation is unremarkable. Mild tortuosity is present in the cervical left ICA without significant stenosis. Intracranial atherosclerotic changes are present without significant stenosis. Vertebral arteries: Left vertebral artery is the dominant vessel. Both vertebral arteries originate from the subclavian arteries without significant stenosis. No significant stenosis is present in either vertebral artery in the neck. Vertebrobasilar junction is normal. Visualized basilar artery is unremarkable. Skeleton: Multilevel degenerative changes are present cervical spine. Chronic loss of disc height is noted from C2-T1. No focal lytic or blastic lesions are present. Other neck: Left submandibular laceration is noted. Stranding is present within the subcutaneous fat. No deep penetration is evident. Upper chest: Patchy ground-glass attenuation is present at the  lung apices bilaterally. Thoracic inlet is normal. IMPRESSION: 1. Left submandibular laceration with stranding within the subcutaneous fat. No deep penetration. 2. No acute vascular injury. 3. No significant vascular disease in the neck. 4. Atherosclerotic changes within the cavernous internal carotid arteries bilaterally without significant stenosis. 5. Multilevel degenerative changes of the cervical spine. 6. Patchy ground-glass  attenuation at the lung apices bilaterally. This may represent atelectasis or edema. Infection is not excluded. Electronically Signed   By: San Morelle M.D.   On: 06/25/2020 21:42   DG C-Arm 1-60 Min  Result Date: 06/26/2020 CLINICAL DATA:  ORIF. EXAM: RIGHT FEMUR 2 VIEWS; DG C-ARM 1-60 MIN COMPARISON:  06/25/2020. FINDINGS: ORIF right femur. Hardware intact. Anatomic alignment. Peripheral vascular calcification. IMPRESSION: ORIF right femur with anatomic alignment. Electronically Signed   By: Marcello Moores  Register   On: 06/26/2020 12:55   DG Hip Unilat W or Wo Pelvis 2-3 Views Right  Result Date: 06/25/2020 CLINICAL DATA:  86 year old female with fall and right hip pain. EXAM: DG HIP (WITH OR WITHOUT PELVIS) 2-3V RIGHT COMPARISON:  None. FINDINGS: There is a mildly displaced, comminuted and angulated fracture of the right femoral neck. The bones are osteopenic. There is no dislocation. The soft tissues are unremarkable. IMPRESSION: Right femoral neck fracture. Electronically Signed   By: Anner Crete M.D.   On: 06/25/2020 21:08   DG FEMUR, MIN 2 VIEWS RIGHT  Result Date: 06/26/2020 CLINICAL DATA:  ORIF. EXAM: RIGHT FEMUR 2 VIEWS; DG C-ARM 1-60 MIN COMPARISON:  06/25/2020. FINDINGS: ORIF right femur. Hardware intact. Anatomic alignment. Peripheral vascular calcification. IMPRESSION: ORIF right femur with anatomic alignment. Electronically Signed   By: Marcello Moores  Register   On: 06/26/2020 12:55   DG FEMUR PORT, MIN 2 VIEWS RIGHT  Result Date: 06/26/2020 CLINICAL DATA:  Intramedullary nail placement EXAM: RIGHT FEMUR PORTABLE 2 VIEW COMPARISON:  June 25, 2020 and intraoperative images June 26, 2020 FINDINGS: Frontal and lateral views were obtained. There is screw and rod fixation through an intertrochanteric femur fracture on the right. Alignment at the fracture site is near anatomic. Screw tip in proximal femoral head. No new fracture. No dislocation. No appreciable knee joint effusion.  Osteoarthritic change noted in the knee joint region. IMPRESSION: Screw and nail fixation through intertrochanteric femur fracture. No new fracture. No dislocation. Arthropathy in the right knee joint region noted. Electronically Signed   By: Lowella Grip III M.D.   On: 06/26/2020 13:51    Scheduled Meds: . amLODipine  5 mg Oral Daily  . aspirin EC  81 mg Oral Daily  . citalopram  20 mg Oral Daily  . docusate sodium  100 mg Oral BID  . enoxaparin (LOVENOX) injection  30 mg Subcutaneous Q24H  . feeding supplement (GLUCERNA SHAKE)  237 mL Oral BID BM  . fluticasone  2 spray Each Nare Daily  . insulin aspart  0-5 Units Subcutaneous QHS  . insulin aspart  0-9 Units Subcutaneous TID WC  . loratadine  5 mg Oral Daily  . LORazepam  0.5 mg Oral QHS  . metoprolol tartrate  12.5 mg Oral BID  . montelukast  10 mg Oral QHS  . multivitamin with minerals  1 tablet Oral Daily  . mupirocin ointment  1 application Topical BID  . olopatadine  1 drop Both Eyes BID  . oxybutynin  10 mg Oral QHS  . pantoprazole  40 mg Oral Daily  . pravastatin  20 mg Oral q1800  . pregabalin  25 mg Oral QPC supper  .  traMADol  50 mg Oral QHS   Continuous Infusions: . sodium chloride 75 mL/hr at 06/27/20 0327  .  ceFAZolin (ANCEF) IV 1 g (06/26/20 2142)     LOS: 2 days   Time spent: [place], DO [facility] To contact the attending provider between 7A-7P or the covering provider during after hours 7P-7A, please log into the web site [email or web address] and access using universal [place] password for that web site. If you do not have the password, please call the hospital operator.  06/27/2020, 7:42 AM

## 2020-06-28 LAB — GLUCOSE, CAPILLARY
Glucose-Capillary: 263 mg/dL — ABNORMAL HIGH (ref 70–99)
Glucose-Capillary: 205 mg/dL — ABNORMAL HIGH (ref 70–99)
Glucose-Capillary: 264 mg/dL — ABNORMAL HIGH (ref 70–99)
Glucose-Capillary: 204 mg/dL — ABNORMAL HIGH (ref 70–99)

## 2020-06-28 LAB — CBC
HCT: 22.6 % — ABNORMAL LOW (ref 36.0–46.0)
Hemoglobin: 7.5 g/dL — ABNORMAL LOW (ref 12.0–15.0)
MCH: 31.5 pg (ref 26.0–34.0)
MCHC: 33.2 g/dL (ref 30.0–36.0)
MCV: 95 fL (ref 80.0–100.0)
Platelets: 171 10*3/uL (ref 150–400)
RBC: 2.38 MIL/uL — ABNORMAL LOW (ref 3.87–5.11)
RDW: 13.1 % (ref 11.5–15.5)
WBC: 11.4 10*3/uL — ABNORMAL HIGH (ref 4.0–10.5)
nRBC: 0 % (ref 0.0–0.2)

## 2020-06-28 LAB — BASIC METABOLIC PANEL
Anion gap: 6 (ref 5–15)
BUN: 23 mg/dL (ref 8–23)
CO2: 25 mmol/L (ref 22–32)
Calcium: 8.2 mg/dL — ABNORMAL LOW (ref 8.9–10.3)
Chloride: 101 mmol/L (ref 98–111)
Creatinine, Ser: 1.2 mg/dL — ABNORMAL HIGH (ref 0.44–1.00)
GFR calc Af Amer: 46 mL/min — ABNORMAL LOW (ref >60)
GFR calc non Af Amer: 40 mL/min — ABNORMAL LOW (ref >60)
Glucose, Bld: 228 mg/dL — ABNORMAL HIGH (ref 70–99)
Potassium: 4.8 mmol/L (ref 3.5–5.1)
Sodium: 132 mmol/L — ABNORMAL LOW (ref 135–145)

## 2020-06-28 MED ORDER — BISACODYL 10 MG RE SUPP
10.0000 mg | Freq: Once | RECTAL | Status: DC
  Filled 2020-06-28: qty 1

## 2020-06-28 MED ORDER — POLYETHYLENE GLYCOL 3350 17 G PO PACK
17.0000 g | PACK | Freq: Two times a day (BID) | ORAL | Status: DC | PRN
  Administered 2020-06-28: 17 g via ORAL
  Filled 2020-06-28: qty 1

## 2020-06-28 NOTE — Progress Notes (Signed)
Physical Therapy Treatment Patient Details Name: Vanessa Gibson MRN: [id number] DOB: May 30, 1928 Today's Date: 06/28/2020    History of Present Illness Pt is an 86 y.o. female admitted 06/25/20 after fall sustaining R femoral neck fx, L neck laceration. S/p R hip IMN 8/3. PMH includes DM2, HTN, CKD 3, breast CX, OA, MI, falls vertigo-related(?).    PT Comments    Pt seated in recliner soiled in urine and sliding forward.  Pt required assistance to move into sitting and standing with mod A.  Pt did progress gt with noticeable fatigue.  Based on CIR denial will update recommendations to SNF at this time. Will leave frequency at 5x week until snf is confirmed.  Will inform supervising PT of need for change in recommendations at this time.  Follow Up Recommendations  Supervision/Assistance - 24 hour;SNF     Equipment Recommendations   (TBD)    Recommendations for Other Services       Precautions / Restrictions Precautions Precautions: Fall Restrictions Weight Bearing Restrictions: Yes RLE Weight Bearing: Weight bearing as tolerated    Mobility  Bed Mobility Overal bed mobility: Needs Assistance Bed Mobility: Sit to Supine       Sit to supine: Mod assist   General bed mobility comments: ModA for RLE management back to bed, assist for positioning; pt limited by R hip pain  Transfers Overall transfer level: Needs assistance Equipment used: Rolling walker (2 wheeled) Transfers: Sit to/from Stand Sit to Stand: Mod assist         General transfer comment: Cues for hand placment and forward weight shifting to rise into standing.  Presents with flexed hips and trunk.  Ambulation/Gait Ambulation/Gait assistance: Mod assist Gait Distance (Feet): 16 Feet Assistive device: Rolling walker (2 wheeled) Gait Pattern/deviations: Step-to pattern;Antalgic;Decreased weight shift to right;Trunk flexed;Leaning posteriorly     General Gait Details: Pt required increased assistance to mod A as  she fatigued.  She required cues for sequencing, RW position, weight shifting, backing and turning.   Stairs             Wheelchair Mobility    Modified Rankin (Stroke Patients Only)       Balance     Sitting balance-Leahy Scale: Poor       Standing balance-Leahy Scale: Poor                              Cognition Arousal/Alertness: Awake/alert Behavior During Therapy: WFL for tasks assessed/performed Overall Cognitive Status: Within Functional Limits for tasks assessed                                 General Comments: WFL for simple tasks, not formally assessed      Exercises General Exercises - Lower Extremity Ankle Circles/Pumps: AROM;Both;10 reps;Supine Quad Sets: AROM;Right;10 reps;Supine Long Arc Quad: AAROM;Right;10 reps;Supine Hip ABduction/ADduction: Right;10 reps;Supine;AAROM    General Comments        Pertinent Vitals/Pain Pain Assessment: Faces Faces Pain Scale: Hurts little more Pain Location: R hip Pain Descriptors / Indicators: Grimacing;Guarding;Sore Pain Intervention(s): Monitored during session;Repositioned    Home Living                      Prior Function            PT Goals (current goals can now be found in the care plan section)  Frequency    Min 5X/week      PT Plan Current plan remains appropriate    Co-evaluation              AM-PAC PT "6 Clicks" Mobility   Outcome Measure  Help needed turning from your back to your side while in a flat bed without using bedrails?: A Lot Help needed moving from lying on your back to sitting on the side of a flat bed without using bedrails?: A Lot Help needed moving to and from a bed to a chair (including a wheelchair)?: A Lot Help needed standing up from a chair using your arms (e.g., wheelchair or bedside chair)?: A Lot Help needed to walk in hospital room?: A Lot Help needed climbing 3-5 steps with a railing? : A Lot 6 Click  Score: 12    End of Session Equipment Utilized During Treatment: Gait belt Activity Tolerance: Patient tolerated treatment well;Patient limited by pain Patient left: in chair;with call bell/phone within reach;with chair alarm set Nurse Communication: Mobility status PT Visit Diagnosis: Unsteadiness on feet (R26.81);Other abnormalities of gait and mobility (R26.89);Pain Pain - Right/Left: Right Pain - part of body: Hip     Time: 4132-4401 PT Time Calculation (min) (ACUTE ONLY): 24 min  Charges:  $Gait Training: 8-22 mins $Therapeutic Exercise: 8-22 mins                     Erasmo Leventhal , PTA Acute Rehabilitation Services Pager [phone] Office [phone]     Jay Kempe Eli Hose 06/28/2020, 5:04 PM

## 2020-06-28 NOTE — ED Provider Notes (Signed)
[facility] Provider Note   CSN: [id number] Arrival date & time: 06/25/20  2006     History Chief Complaint  Patient presents with  . Fall  . Hip Deformity    Vanessa Gibson is a 86 y.o. female.  Fall with r hip deformity and pain   Fall This is a new problem. The current episode started less than 1 hour ago. The problem occurs constantly. The problem has not changed since onset.Pertinent negatives include no chest pain, no abdominal pain and no headaches. Nothing aggravates the symptoms. Nothing relieves the symptoms. She has tried nothing for the symptoms.       Past Medical History:  Diagnosis Date  . Abnormal EKG    RBBB and LAFB  . Anxiety and depression   . Axillary adenopathy 01/2020   CT 12/2019 incidental finding, pt had recent covid vaccine in ipsilateral arm.  F/u axillary u/s 1 mo later showed dec in size of node.  F/u axillary u/s 3 mo recommended.  . Chronic renal insufficiency, stage 3 (moderate)    GFR @40   . Chronic venous insufficiency   . Diabetes mellitus ([place])    managed by endo Dr. Dwyane Dee  . Dizziness   . DOE (dyspnea on exertion)    no desat, CT chest ok except mild small airways dz. Hiatal hernia.  DECONDITIONING  . GERD (gastroesophageal reflux disease)   . Hard of hearing   . Hiatal hernia    Moderate size (contributing to pt's DOE?)  . History of bilateral breast cancer approx age 82   bilat mastectomy then tamoxifen x 10 yrs  . Hyperlipemia   . Hypertension   . IBS (irritable bowel syndrome)   . Mandibular fracture ([place]) 12/04/2015  . MI (myocardial infarction) ([place])   . Osteoarthritis, multiple sites   . Paresthesias    burning, hands and feet.  Gabapentin no help.  Dr. Dwyane Dee started her on lyrica 02/2020.  . Skin cancer of face   . Ventral hernia "many years"   supraumbilical    Patient Active Problem List   Diagnosis Date Noted  . Closed displaced fracture of right femoral neck ([place]) 06/25/2020    . Exudative age-related macular degeneration of right eye with active choroidal neovascularization ([place]) 04/05/2020  . Exudative age-related macular degeneration of left eye with active choroidal neovascularization ([place]) 04/05/2020  . Intermediate stage nonexudative age-related macular degeneration of left eye 04/05/2020  . Retinal pigment epithelium serous detachment, right 04/05/2020  . Bilateral hand numbness 10/01/2018  . Neck pain 10/01/2018  . Bilateral lower extremity edema 06/18/2018  . Primary osteoarthritis of both knees 06/18/2018  . Arthralgia of both hands 06/18/2018  . Chronic cough 06/18/2018  . Hernia of abdominal wall 06/18/2018  . ILD (interstitial lung disease) ([place]) 04/05/2018  . Depression, recurrent ([place]), unhappy in [place] but feels stuck, declines medication or counseling 01/24/2018  . At risk for polypharmacy, due to vitamin overuse, working with patient to "deprescribe" 01/24/2018  . Constipation, uses Miralax prn 01/24/2018  . Diabetic peripheral neuropathy associated with type 2 diabetes mellitus ([place]), unable to tolerate Neurontin 01/24/2018  . Aortic atherosclerosis ([place]), 2018 CXR 01/24/2018  . Lumbar spine scoliosis 01/24/2018  . Hyperlipidemia associated with type 2 diabetes mellitus ([place]), on statin 01/24/2018  . IBS (irritable bowel syndrome) 01/24/2018  . Uncontrolled type 2 diabetes mellitus with hyperglycemia, with long-term current use of insulin ([place]) 12/02/2017  . Insomnia 06/06/2017  . CKD stage 3 secondary to  diabetes ([place]), on Lisinopril 05/26/2017  . Osteoporosis, post-menopausal 02/06/2016  . Bilateral hearing loss, refuses hearing aids or further testing 02/06/2016  . Arthritis, takes Tumeric  02/06/2016  . Anemia of chronic disease 12/05/2015  . Protein-calorie malnutrition, weight stable 11/13/2015  . Chronic pancreatitis ([place]) 11/13/2015  . GERD (gastroesophageal reflux disease), on Nexium 11/13/2015  . Hypertension associated with  diabetes ([place]), on Lisinopril 11/13/2015  . Allergic rhinitis, Rx Xyzal, flonase, atrovent and has been offered referral to Allergy 11/13/2015    Past Surgical History:  Procedure Laterality Date  . BREAST SURGERY    . Cardiac event monitor  2016   Normal  . CATARACT EXTRACTION W/ INTRAOCULAR LENS  IMPLANT, BILATERAL Bilateral   . CHOLECYSTECTOMY OPEN  1970's  . FEMUR IM NAIL Right 06/26/2020   Procedure: INTERMEDULLARY FEMORAL AFFIXUS NAIL;  Surgeon: Altamese Browning, MD;  Location: [facility];  Service: Orthopedics;  Laterality: Right;  . MASTECTOMY Bilateral   . PFTs  04/05/2018   Interpretation: The FVC, FEV1, FEV1/FVC ratio and FEF25-75% are within normal limits. Patient effort was erratic, but mild dec in diff capacity noted--mild dec functional alveolar tissue  . TRANSTHORACIC ECHOCARDIOGRAM  11/09/2019   EF 60-65%, grd I DD.     OB History   No obstetric history on file.     History reviewed. No pertinent family history.  Social History   Tobacco Use  . Smoking status: Never Smoker  . Smokeless tobacco: Never Used  Vaping Use  . Vaping Use: Never used  Substance Use Topics  . Alcohol use: No  . Drug use: No    Home Medications Prior to Admission medications   Medication Sig Start Date End Date Taking? Authorizing Provider  ACCU-CHEK GUIDE test strip CHECK BLOOD SUGAR THREE TO FOUR TIMES DAILY Patient taking differently: 1 each by Other route in the morning, at noon, in the evening, and at bedtime.  09/12/19  Yes Elayne Snare, MD  Accu-Chek Softclix Lancets lancets Use as instructed to check blood sugar 3-4 times daily. Patient taking differently: 1 each by Other route See admin instructions. Use as instructed to check blood sugar 3-4 times daily. 03/05/20  Yes Elayne Snare, MD  Alcohol Swabs (B-D SINGLE USE SWABS REGULAR) PADS USE TO CLEAN FINGER BEFORE CHECKING BLOOD SUGARS AND CLEAN SKIN BEFORE INSULIN INJECTIONS. Patient taking differently: 1 each by Other route See  admin instructions. Use to clean finger before checking blood sugars and clean skin before insulin injections. 05/16/19  Yes Elayne Snare, MD  amLODipine (NORVASC) 5 MG tablet Take 1 tablet (5 mg total) by mouth daily. 02/01/20  Yes McGowen, Adrian Blackwater, MD  aspirin EC 81 MG tablet Take 81 mg by mouth daily.   Yes [provider]  BD PEN NEEDLE NANO U/F 32G X 4 MM MISC USE TO INJECT INSULINS 4 TIMES DAILY. Patient taking differently: 1 each by Other route in the morning, at noon, in the evening, and at bedtime.  09/12/19  Yes Elayne Snare, MD  Blood Glucose Monitoring Suppl (ACCU-CHEK GUIDE) w/Device KIT Use Accu Chek Guide meter to check blood sugar 3-4 times daily. Patient taking differently: 1 each by Other route See admin instructions. Use Accu Chek Guide meter to check blood sugar 3-4 times daily.  06/08/20  Yes Elayne Snare, MD  citalopram (CELEXA) 20 MG tablet TAKE 1 TABLET ONCE DAILY. Patient taking differently: Take 20 mg by mouth daily.  06/21/20  Yes McGowen, Adrian Blackwater, MD  diclofenac Sodium (VOLTAREN) 1 %  GEL Apply 4 g topically 4 (four) times daily. 06/08/20  Yes McGowen, Adrian Blackwater, MD  fluticasone (FLONASE) 50 MCG/ACT nasal spray Place 2 sprays into both nostrils daily. 06/11/20  Yes McGowen, Adrian Blackwater, MD  insulin aspart (NOVOLOG FLEXPEN) 100 UNIT/ML FlexPen Inject 6 Units into the skin in the morning and at bedtime. Inject 6 units under the skin before breakfast and 8 units before supper.    Yes [provider]  levocetirizine (XYZAL) 5 MG tablet Take 0.5 tablets (2.5 mg total) by mouth every evening. 08/18/19  Yes Briscoe Deutscher, DO  LORazepam (ATIVAN) 0.5 MG tablet TAKE 1 TABLET AT BEDTIME Patient taking differently: Take 0.5 mg by mouth at bedtime.  02/15/20  Yes McGowen, Adrian Blackwater, MD  lovastatin (MEVACOR) 20 MG tablet TAKE ONE TABLET AT BEDTIME. Patient taking differently: Take 20 mg by mouth at bedtime.  05/25/20  Yes McGowen, Adrian Blackwater, MD  metoprolol tartrate (LOPRESSOR) 25 MG  tablet Take 0.5 tablets (12.5 mg total) by mouth 2 (two) times daily. TAKE 1 TABLET BY MOUTH TWICE DAILY. Patient taking differently: Take 25 mg by mouth 2 (two) times daily.  12/07/19  Yes Orma Flaming, MD  montelukast (SINGULAIR) 10 MG tablet Take 1 tablet (10 mg total) by mouth at bedtime. 08/18/19  Yes Briscoe Deutscher, DO  Multiple Vitamins-Minerals (CENTRUM SILVER 50+WOMEN) TABS Take 1 tablet by mouth daily.   Yes [provider]  mupirocin ointment (BACTROBAN) 2 % Apply 1 application topically 2 (two) times daily.  06/07/20  Yes [provider]  Olopatadine HCl (PATADAY) 0.2 % SOLN Apply 1 drop to each eye once daily. 05/05/19  Yes Vivi Barrack, MD  omeprazole (PRILOSEC) 40 MG capsule TAKE 1 CAPSULE (40 MG TOTAL) BY MOUTH DAILY. 05/04/20  Yes McGowen, Adrian Blackwater, MD  oxybutynin (DITROPAN XL) 10 MG 24 hr tablet Take 1 tablet (10 mg total) by mouth at bedtime. 06/15/20  Yes McGowen, Adrian Blackwater, MD  pregabalin (LYRICA) 25 MG capsule Take 1 capsule (25 mg total) by mouth daily after supper. 03/05/20  Yes Elayne Snare, MD  traMADol (ULTRAM) 50 MG tablet TAKE (1) TABLET EVERY EIGHT HOURS AS NEEDED FOR PAIN. Patient taking differently: Take 50 mg by mouth daily.  05/30/20  Yes McGowen, Adrian Blackwater, MD  TRESIBA FLEXTOUCH 100 UNIT/ML SOPN FlexTouch Pen INJECT 20 UNITS SUBCUTANEOUSLY ONE TIME DAILY Patient taking differently: Inject 16 Units into the skin daily.  05/16/19  Yes Elayne Snare, MD  azelastine (ASTELIN) 0.1 % nasal spray Place 1 spray into both nostrils 2 (two) times daily. Use in each nostril as directed Patient not taking: Reported on 06/25/2020 07/04/19   Briscoe Deutscher, DO  Tiotropium Bromide-Olodaterol (STIOLTO RESPIMAT) 2.5-2.5 MCG/ACT AERS Inhale 2 puffs into the lungs daily. Patient not taking: Reported on 06/25/2020 06/11/20   McGowen, Adrian Blackwater, MD    Allergies    Patient has no known allergies.  Review of Systems   Review of Systems  Cardiovascular: Negative for chest pain.   Gastrointestinal: Negative for abdominal pain.  Neurological: Negative for headaches.  All other systems reviewed and are negative.   Physical Exam Updated Vital Signs BP (!) 131/54 (BP Location: Left Arm)   Pulse 94   Temp 98.9 F (37.2 C) (Oral)   Resp 15   Ht 5' (1.524 m)   Wt 60.3 kg   LMP  (LMP Unknown)   SpO2 95%   BMI 25.97 kg/m   Physical Exam Vitals and nursing note reviewed.  Constitutional:  Appearance: She is well-developed.  HENT:     Head: Normocephalic and atraumatic.     Nose: No congestion or rhinorrhea.     Mouth/Throat:     Mouth: Mucous membranes are moist.     Pharynx: Oropharynx is clear.  Eyes:     Pupils: Pupils are equal, round, and reactive to light.  Cardiovascular:     Rate and Rhythm: Normal rate and regular rhythm.  Pulmonary:     Effort: No respiratory distress.     Breath sounds: No stridor.  Abdominal:     General: There is no distension.  Musculoskeletal:        General: Tenderness and deformity (right hip) present.     Cervical back: Normal range of motion.  Skin:    General: Skin is warm and dry.  Neurological:     General: No focal deficit present.     Mental Status: She is alert.     ED Results / Procedures / Treatments   Labs (all labs ordered are listed, but only abnormal results are displayed) Labs Reviewed  CBC WITH DIFFERENTIAL/PLATELET - Abnormal; Notable for the following components:      Result Value   RBC 3.71 (*)    Hemoglobin 11.7 (*)    HCT 35.4 (*)    Abs Immature Granulocytes 0.10 (*)    All other components within normal limits  COMPREHENSIVE METABOLIC PANEL - Abnormal; Notable for the following components:   Sodium 130 (*)    Chloride 97 (*)    Glucose, Bld 259 (*)    Creatinine, Ser 1.22 (*)    Calcium 8.8 (*)    Total Protein 6.2 (*)    GFR calc non Af Amer 39 (*)    GFR calc Af Amer 45 (*)    All other components within normal limits  CBC - Abnormal; Notable for the following  components:   WBC 14.0 (*)    RBC 3.36 (*)    Hemoglobin 10.5 (*)    HCT 31.8 (*)    All other components within normal limits  BASIC METABOLIC PANEL - Abnormal; Notable for the following components:   Sodium 131 (*)    Chloride 97 (*)    Glucose, Bld 200 (*)    Creatinine, Ser 1.16 (*)    Calcium 8.8 (*)    GFR calc non Af Amer 42 (*)    GFR calc Af Amer 48 (*)    All other components within normal limits  GLUCOSE, CAPILLARY - Abnormal; Notable for the following components:   Glucose-Capillary 284 (*)    All other components within normal limits  GLUCOSE, CAPILLARY - Abnormal; Notable for the following components:   Glucose-Capillary 169 (*)    All other components within normal limits  GLUCOSE, CAPILLARY - Abnormal; Notable for the following components:   Glucose-Capillary 154 (*)    All other components within normal limits  URINALYSIS, ROUTINE W REFLEX MICROSCOPIC - Abnormal; Notable for the following components:   Hgb urine dipstick MODERATE (*)    Nitrite POSITIVE (*)    Leukocytes,Ua SMALL (*)    Bacteria, UA MANY (*)    All other components within normal limits  GLUCOSE, CAPILLARY - Abnormal; Notable for the following components:   Glucose-Capillary 169 (*)    All other components within normal limits  GLUCOSE, CAPILLARY - Abnormal; Notable for the following components:   Glucose-Capillary 154 (*)    All other components within normal limits  GLUCOSE, CAPILLARY - Abnormal;  Notable for the following components:   Glucose-Capillary 167 (*)    All other components within normal limits  GLUCOSE, CAPILLARY - Abnormal; Notable for the following components:   Glucose-Capillary 193 (*)    All other components within normal limits  BASIC METABOLIC PANEL - Abnormal; Notable for the following components:   Sodium 132 (*)    Glucose, Bld 156 (*)    BUN 26 (*)    Creatinine, Ser 1.44 (*)    Calcium 8.2 (*)    GFR calc non Af Amer 32 (*)    GFR calc Af Amer 37 (*)    All  other components within normal limits  GLUCOSE, CAPILLARY - Abnormal; Notable for the following components:   Glucose-Capillary 277 (*)    All other components within normal limits  GLUCOSE, CAPILLARY - Abnormal; Notable for the following components:   Glucose-Capillary 254 (*)    All other components within normal limits  GLUCOSE, CAPILLARY - Abnormal; Notable for the following components:   Glucose-Capillary 197 (*)    All other components within normal limits  CBC - Abnormal; Notable for the following components:   WBC 11.2 (*)    RBC 2.40 (*)    Hemoglobin 7.6 (*)    HCT 22.0 (*)    Platelets 125 (*)    All other components within normal limits  GLUCOSE, CAPILLARY - Abnormal; Notable for the following components:   Glucose-Capillary 152 (*)    All other components within normal limits  GLUCOSE, CAPILLARY - Abnormal; Notable for the following components:   Glucose-Capillary 183 (*)    All other components within normal limits  GLUCOSE, CAPILLARY - Abnormal; Notable for the following components:   Glucose-Capillary 222 (*)    All other components within normal limits  GLUCOSE, CAPILLARY - Abnormal; Notable for the following components:   Glucose-Capillary 243 (*)    All other components within normal limits  CBC - Abnormal; Notable for the following components:   WBC 11.4 (*)    RBC 2.38 (*)    Hemoglobin 7.5 (*)    HCT 22.6 (*)    All other components within normal limits  BASIC METABOLIC PANEL - Abnormal; Notable for the following components:   Sodium 132 (*)    Glucose, Bld 228 (*)    Creatinine, Ser 1.20 (*)    Calcium 8.2 (*)    GFR calc non Af Amer 40 (*)    GFR calc Af Amer 46 (*)    All other components within normal limits  GLUCOSE, CAPILLARY - Abnormal; Notable for the following components:   Glucose-Capillary 250 (*)    All other components within normal limits  GLUCOSE, CAPILLARY - Abnormal; Notable for the following components:   Glucose-Capillary 263 (*)     All other components within normal limits  SARS CORONAVIRUS 2 BY RT PCR (HOSPITAL ORDER, [facility])  SURGICAL PCR SCREEN  PROTIME-INR  TYPE AND SCREEN  ABO/RH    EKG None  Radiology DG C-Arm 1-60 Min  Result Date: 06/26/2020 CLINICAL DATA:  ORIF. EXAM: RIGHT FEMUR 2 VIEWS; DG C-ARM 1-60 MIN COMPARISON:  06/25/2020. FINDINGS: ORIF right femur. Hardware intact. Anatomic alignment. Peripheral vascular calcification. IMPRESSION: ORIF right femur with anatomic alignment. Electronically Signed   By: Marcello Moores  Register   On: 06/26/2020 12:55   DG FEMUR, MIN 2 VIEWS RIGHT  Result Date: 06/26/2020 CLINICAL DATA:  ORIF. EXAM: RIGHT FEMUR 2 VIEWS; DG C-ARM 1-60 MIN COMPARISON:  06/25/2020.  FINDINGS: ORIF right femur. Hardware intact. Anatomic alignment. Peripheral vascular calcification. IMPRESSION: ORIF right femur with anatomic alignment. Electronically Signed   By: Marcello Moores  Register   On: 06/26/2020 12:55   DG FEMUR PORT, MIN 2 VIEWS RIGHT  Result Date: 06/26/2020 CLINICAL DATA:  Intramedullary nail placement EXAM: RIGHT FEMUR PORTABLE 2 VIEW COMPARISON:  June 25, 2020 and intraoperative images June 26, 2020 FINDINGS: Frontal and lateral views were obtained. There is screw and rod fixation through an intertrochanteric femur fracture on the right. Alignment at the fracture site is near anatomic. Screw tip in proximal femoral head. No new fracture. No dislocation. No appreciable knee joint effusion. Osteoarthritic change noted in the knee joint region. IMPRESSION: Screw and nail fixation through intertrochanteric femur fracture. No new fracture. No dislocation. Arthropathy in the right knee joint region noted. Electronically Signed   By: Lowella Grip III M.D.   On: 06/26/2020 13:51    Procedures Procedures (including critical care time)  Medications Ordered in ED Medications  HYDROcodone-acetaminophen (NORCO/VICODIN) 5-325 MG per tablet 1-2 tablet (1 tablet Oral  Given 06/27/20 1632)  morphine 2 MG/ML injection 0.5 mg ( Intravenous MAR Unhold 06/26/20 1301)  multivitamin with minerals tablet 1 tablet (1 tablet Oral Given 06/27/20 0936)  feeding supplement (GLUCERNA SHAKE) (GLUCERNA SHAKE) liquid 237 mL (237 mLs Oral Given 06/27/20 1632)  amLODipine (NORVASC) tablet 5 mg (5 mg Oral Given 06/27/20 0936)  aspirin EC tablet 81 mg (81 mg Oral Given 06/27/20 0936)  citalopram (CELEXA) tablet 20 mg (20 mg Oral Given 06/27/20 0936)  fluticasone (FLONASE) 50 MCG/ACT nasal spray 2 spray (2 sprays Each Nare Given 06/27/20 0937)  olopatadine (PATANOL) 0.1 % ophthalmic solution 1 drop (1 drop Both Eyes Not Given 06/27/20 2210)  pantoprazole (PROTONIX) EC tablet 40 mg (40 mg Oral Given 06/27/20 0935)  mupirocin ointment (BACTROBAN) 2 % 1 application (1 application Topical Not Given 06/27/20 2210)  montelukast (SINGULAIR) tablet 10 mg (10 mg Oral Given 06/27/20 2153)  traMADol (ULTRAM) tablet 50 mg (50 mg Oral Given 06/27/20 2153)  oxybutynin (DITROPAN-XL) 24 hr tablet 10 mg (10 mg Oral Given 06/27/20 2210)  pregabalin (LYRICA) capsule 25 mg (25 mg Oral Given 06/27/20 1844)  pravastatin (PRAVACHOL) tablet 20 mg (20 mg Oral Given 06/27/20 1844)  metoprolol tartrate (LOPRESSOR) tablet 12.5 mg (12.5 mg Oral Given 06/27/20 2153)  LORazepam (ATIVAN) tablet 0.5 mg (0.5 mg Oral Given 06/27/20 2153)  docusate sodium (COLACE) capsule 100 mg (100 mg Oral Given 06/27/20 2153)  ondansetron (ZOFRAN) tablet 4 mg (has no administration in time range)    Or  ondansetron (ZOFRAN) injection 4 mg (has no administration in time range)  enoxaparin (LOVENOX) injection 30 mg (30 mg Subcutaneous Given 06/27/20 0931)  0.9 %  sodium chloride infusion ( Intravenous Rate/Dose Verify 06/27/20 1652)  loratadine (CLARITIN) tablet 5 mg (5 mg Oral Not Given 06/27/20 0937)  insulin aspart (novoLOG) injection 0-9 Units (3 Units Subcutaneous Given 06/27/20 1649)  insulin aspart (novoLOG) injection 0-5 Units (0 Units Subcutaneous Not Given  06/27/20 2155)  lidocaine-EPINEPHrine (XYLOCAINE W/EPI) 1 %-1:100000 (with pres) injection 20 mL (20 mLs Intradermal Given 06/25/20 2038)  iohexol (OMNIPAQUE) 350 MG/ML injection 50 mL (50 mLs Intravenous Contrast Given 06/25/20 2110)  ceFAZolin (ANCEF) IVPB 2g/100 mL premix (2 g Intravenous Given 06/26/20 1000)  celecoxib (CELEBREX) capsule 200 mg (200 mg Oral Given 06/26/20 0828)  acetaminophen (TYLENOL) tablet 1,000 mg (1,000 mg Oral Given 06/26/20 0827)  chlorhexidine (PERIDEX) 0.12 % solution 15 mL (15 mLs  Mouth/Throat Given 06/26/20 0850)    Or  MEDLINE mouth rinse ( Mouth Rinse See Alternative 06/26/20 0850)  metoprolol tartrate (LOPRESSOR) tablet 25 mg (25 mg Oral Given 06/26/20 0849)  ceFAZolin (ANCEF) IVPB 1 g/50 mL premix (1 g Intravenous New Bag/Given 06/27/20 4451)    ED Course  I have reviewed the triage vital signs and the nursing notes.  Pertinent labs & imaging results that were available during my care of the patient were reviewed by me and considered in my medical decision making (see chart for details).    MDM Rules/Calculators/A&P                          Right hip fracture, d/w Dr. Percell Miller, will operate tomorrow. Plan for medicine admission.   Final Clinical Impression(s) / ED Diagnoses Final diagnoses:  Fall, initial encounter  Left displaced femoral neck fracture [facility])    Rx / DC Orders ED Discharge Orders    None       Malka Bocek, Corene Cornea, MD 06/28/20 [phone]

## 2020-06-28 NOTE — Progress Notes (Signed)
[facility] (OTS)  2 Days Post-Op Procedure(s) (LRB): INTERMEDULLARY FEMORAL AFFIXUS NAIL (Right)  Subjective: Patient reports pain as mild when sitting or lying and bed but great difficulty picking up her leg.    Objective: Current Vitals Blood pressure (!) 131/54, pulse 94, temperature 98.9 F (37.2 C), temperature source Oral, resp. rate 15, height 5' (1.524 m), weight 60.3 kg, SpO2 95 %. Vital signs in last 24 hours: Temp:  [98 F (36.7 C)-98.9 F (37.2 C)] 98.9 F (37.2 C) (08/05 0729) Pulse Rate:  [77-94] 94 (08/05 0729) Resp:  [15-18] 15 (08/05 0729) BP: (116-131)/(54-64) 131/54 (08/05 0729) SpO2:  [95 %-98 %] 95 % (08/05 0729)  Intake/Output from previous day: 08/04 0701 - 08/05 0700 In: 2587.4 [P.O.:720; I.V.:1867.4] Out: -   LABS Recent Labs    06/25/20 2036 06/26/20 0224 06/27/20 0611 06/28/20 0507  HGB 11.7* 10.5* 7.6* 7.5*   Recent Labs    06/27/20 0611 06/28/20 0507  WBC 11.2* 11.4*  RBC 2.40* 2.38*  HCT 22.0* 22.6*  PLT 125* 171   Recent Labs    06/27/20 0331 06/28/20 0507  NA 132* 132*  K 4.9 4.8  CL 98 101  CO2 26 25  BUN 26* 23  CREATININE 1.44* 1.20*  GLUCOSE 156* 228*  CALCIUM 8.2* 8.2*   Recent Labs    06/25/20 2036  INR 1.1     Physical Exam RLE  Dressing intact, clean, dry  Edema/ swelling controlled  Sens: DPN, SPN, TN intact  Motor: EHL, FHL, and lessor toe ext and flex all intact grossly  Brisk cap refill, warm to touch, DP 2+  Assessment/Plan: 2 Days Post-Op Procedure(s) (LRB): INTERMEDULLARY FEMORAL AFFIXUS NAIL (Right) 1. PT/OT WBAT, no ROM restrictions 2. DVT proph Lovenox 3. F/u 8-14 days after d/c.  4. Rehab option appears to be a no go. Awaiting options elsewhere. Patient does have LTC policy.  Altamese Holly, MD [facility], [facility] [phone]

## 2020-06-28 NOTE — Progress Notes (Signed)
PROGRESS NOTE    Vanessa Gibson  HXT:[id number] DOB: [birth year] DOA: 06/25/2020 PCP: Tammi Sou, MD  Brief Narrative:  86 white female born in [place] originally from [place] living [facility] Unwitnessed fall with right hip fracture laceration left jaw pressure bandage was placed received fentanyl MI 1999 HLD chronic vertigo, chronic pancreatitis?  Nonalcoholic ?  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  Vertigo related the placed 30-day Holter monitor at the time Recent visit to primary care physician 06/15/2020-major addressed issues at the time major depression chronic fatigue-had seen dermatology with 2 lesions removed from head and neck mid July where they increased Ditropan XL to 10 mg daily  Assessment & Plan:  Principal Problem:   Closed displaced fracture of right femoral neck (HCC) Active Problems:   Hypertension associated with diabetes ([place]), on Lisinopril   CKD stage 3 secondary to diabetes ([place]), on Lisinopril   Uncontrolled type 2 diabetes mellitus with hyperglycemia, with long-term current use of insulin ([place])   Acute right hip fracture with ambulatory dysfunction, likely mechanical fall - ORIF per orthopedic surgery, DVT prophylaxis and pain management per their team as well, appreciate insight and recommendations - PT following for disposition, recommending SNF however patient is requesting discharge home back to ALF - Advance diet as tolerated  Hypertension - Continue amlodipine 5, metoprolol 12.5 twice daily - Pressures are well controlled at this time  Mild neck laceration without any complication - This is from a dermatological procedure which had scarred over and then seem to open up after her fall - If she goes to skilled facility this can be removed in 7 days, if not, ALF can arrange for PCP to see in 5 to 7 days to remove the same  Mild periop  anemia and leukemoid reaction CBC Latest Ref Rng & Units 06/28/2020 06/27/2020 06/26/2020  WBC 4.0 - 10.5 K/uL 11.4(H) 11.2(H) 14.0(H)  Hemoglobin 12.0 - 15.0 g/dL 7.5(L) 7.6(L) 10.5(L)  Hematocrit 36 - 46 % 22.6(L) 22.0(L) 31.8(L)  Platelets 150 - 400 K/uL 171 125(L) 231   CAD w/ history of MI 1999 - Empiric aspirin to continue-no further info available  DM 2 with complication of neuropathy, poorly controlled - On Tresiba which is non-formulary - Continue sliding scale insulin hypoglycemic protocol Lab Results  Component Value Date   HGBA1C 6.4 (A) 06/04/2020  - Continue Lyrica for neuropathy  Major depressive disorder - Continue Celexa 20, Ativan 0.5 at bedtime - Consider de-escalation off of Ativan in the outpatient setting if possible  Chronic vertigo Questionable pancreatitis - ruled out Prior DCIS age 86 Adrenal incidentaloma   DVT prophylaxis: SCDs as per orthopedics Code Status: Full Family Communication: [name] updated over the phone - agreeable to CIR/SNF  Disposition:  Status is: Inpatient Remains inpatient appropriate because:Ongoing active pain requiring inpatient pain management Dispo: The patient is from: Home              Anticipated d/c is to: CIR              Anticipated d/c date is: 1 day              Patient currently is not medically stable to d/c.   Consultants:  Orthopedics  Procedures:  ORIF Right Hip  Antimicrobials:  Perioperative as per Ortho  Subjective: No acute issues or events overnight, patient's only complaint today is constipation, otherwise denies nausea, vomiting, headache,  fever, chills.  Objective: Vitals:   06/27/20 1500 06/27/20 1931 06/28/20 0349 06/28/20 0729  BP: 130/64 (!) 116/56 (!) 131/55 (!) 131/54  Pulse: 77 81 89 94  Resp:  18 17 15   Temp: 98.3 F (36.8 C) 98 F (36.7 C) 98.3 F (36.8 C) 98.9 F (37.2 C)  TempSrc: Oral Oral Oral Oral  SpO2: 98% 96% 96% 95%  Weight:      Height:        Intake/Output Summary  (Last 24 hours) at 06/28/2020 1527 Last data filed at 06/28/2020 0900 Gross per 24 hour  Intake 2107.44 ml  Output --  Net 2107.44 ml   Filed Weights   06/25/20 2012  Weight: 60.3 kg    Examination:  General exam: Pleasant alert oriented no distress EOMI NCAT Left neck has sutures in place from laceration mild next laceration Respiratory system: Clear no added sound Cardiovascular system: S1-S2 no murmur rub or gallop Gastrointestinal system: Soft nontender no rebound. Central nervous system: Intact no focal deficit Extremities: No lower extremity edema Skin: Wound to left neck as above Psychiatry: Euthymic pleasant  Data Reviewed: I have personally reviewed following labs and imaging studies Sodium 131 BUN/creatinine to 23/1.2 which is baseline Hemoglobin 10.5, WBC 14  Radiology Studies: No results found.  Scheduled Meds: . amLODipine  5 mg Oral Daily  . aspirin EC  81 mg Oral Daily  . citalopram  20 mg Oral Daily  . docusate sodium  100 mg Oral BID  . enoxaparin (LOVENOX) injection  30 mg Subcutaneous Q24H  . feeding supplement (GLUCERNA SHAKE)  237 mL Oral BID BM  . fluticasone  2 spray Each Nare Daily  . insulin aspart  0-5 Units Subcutaneous QHS  . insulin aspart  0-9 Units Subcutaneous TID WC  . loratadine  5 mg Oral Daily  . LORazepam  0.5 mg Oral QHS  . metoprolol tartrate  12.5 mg Oral BID  . montelukast  10 mg Oral QHS  . multivitamin with minerals  1 tablet Oral Daily  . mupirocin ointment  1 application Topical BID  . olopatadine  1 drop Both Eyes BID  . oxybutynin  10 mg Oral QHS  . pantoprazole  40 mg Oral Daily  . pravastatin  20 mg Oral q1800  . pregabalin  25 mg Oral QPC supper  . traMADol  50 mg Oral QHS   Continuous Infusions: . sodium chloride 75 mL/hr at 06/27/20 1652     LOS: 3 days   Time spent: [place], DO [facility] To contact the attending provider between 7A-7P or the covering provider during after hours  7P-7A, please log into the web site [email or web address] and access using universal [place] password for that web site. If you do not have the password, please call the hospital operator.  06/28/2020, 3:27 PM

## 2020-06-28 NOTE — TOC CAGE-AID Note (Signed)
Transition of Care [facility]) - CAGE-AID Screening   Patient Details  Name: Vanessa Gibson MRN: [id number] Date of Birth: Jul 29, 1928  Transition [facility] CM/SW Contact:    Emeterio Reeve, [place] Phone Number: 06/28/2020, 3:38 PM   Clinical Narrative:  CSW met with pt at bedside. CSW introduced self and explained her role at the hospital.  Pt denied alcohol use and substance use. Pt did not need resources at this time.  CAGE-AID Screening:    Have You Ever Felt You Ought to Cut Down on Your Drinking or Drug Use?: No Have People Annoyed You By Critizing Your Drinking Or Drug Use?: No Have You Felt Bad Or Guilty About Your Drinking Or Drug Use?: No Have You Ever Had a Drink or Used Drugs First Thing In The Morning to Steady Your Nerves or to Get Rid of a Hangover?: No CAGE-AID Score: 0  Substance Abuse Education Offered: Yes     Blima Ledger, [place] Social Worker [phone]

## 2020-06-28 NOTE — TOC Progression Note (Addendum)
Transition of Care [facility]) - Progression Note    Patient Details  Name: Vanessa Gibson MRN: [id number] Date of Birth: 12/27/1927  Transition of Care [facility]) CM/SW Contact  Curlene Labrum, RN Phone Number: 06/28/2020, 2:58 PM  Clinical Narrative:     Case management spoke with the patient's son, [name] and the son chose 1. [facility] and 2. [facility].  I called [facility] spoke with Lilia Pro and the facility has an open bed and is in network with the patient's insurance.  I faxed clinicals to the facility [phone]) and as soon as they are able to give me a bed offer - I will start insurance authorization with [place] [facility].  8/5- 1600- Patient's insurance is not managed with [employer] - spoke with Lilia Pro at [facility] and she stated insurance authorization for the patient.  [facility] admits patients over the weekend so if they get authorization - they will reach out to case management over the weekend to have her transferred.  The patient and son, [name] are are both aware.   Expected Discharge Plan: [place] (Inpatient Rehab versus home with home health if patient is able to establish 24 hour supervision with family and/or [facility]) Barriers to Discharge: Continued Medical Work up  Expected Discharge Plan and Services Expected Discharge Plan: [place] (Inpatient Rehab versus home with home health if patient is able to establish 24 hour supervision with family and/or [place])   Discharge Planning Services: CM Consult Post Acute Care Choice: IP Rehab, Home Health ([place] versus IP Rehab) Living arrangements for the past 2 months: [place] ([place])                                       Social Determinants of Health (SDOH) Interventions    Readmission Risk Interventions Readmission Risk Prevention Plan 06/27/2020  Transportation Screening Complete  PCP or  Specialist Appt within 5-7 Days Complete  Home Care Screening Complete  Medication Review (RN CM) Complete  Some recent data might be hidden

## 2020-06-28 NOTE — Progress Notes (Signed)
Inpatient Diabetes Program Recommendations  AACE/ADA: New Consensus Statement on Inpatient Glycemic Control (2015)  Target Ranges:  Prepandial:   less than 140 mg/dL      Peak postprandial:   less than 180 mg/dL (1-2 hours)      Critically ill patients:  140 - 180 mg/dL   Lab Results  Component Value Date   GLUCAP 263 (H) 06/28/2020   HGBA1C 6.4 (A) 06/04/2020    Review of Glycemic Control Results for Vanessa Gibson, Vanessa Gibson (MRN [id number]) as of 06/28/2020 10:54  Ref. Range 06/27/2020 08:09 06/27/2020 11:34 06/27/2020 16:44 06/27/2020 20:14 06/28/2020 06:43  Glucose-Capillary Latest Ref Range: 70 - 99 mg/dL 183 (H) 222 (H) 243 (H) 250 (H) 263 (H)   Inpatient Diabetes Program Recommendations:   -Add 75% home basal Lantus insulin = 12 units Secure chat to Dr. Avon Gully.  Thank you, Nani Gasser. Hughes Wyndham, RN, MSN, CDE  Diabetes Coordinator Inpatient Glycemic Control Team Team Pager [phone] (8am-5pm) 06/28/2020 10:56 AM

## 2020-06-29 LAB — GLUCOSE, CAPILLARY
Glucose-Capillary: 228 mg/dL — ABNORMAL HIGH (ref 70–99)
Glucose-Capillary: 222 mg/dL — ABNORMAL HIGH (ref 70–99)
Glucose-Capillary: 214 mg/dL — ABNORMAL HIGH (ref 70–99)
Glucose-Capillary: 225 mg/dL — ABNORMAL HIGH (ref 70–99)

## 2020-06-29 MED ORDER — INSULIN GLARGINE 100 UNIT/ML ~~LOC~~ SOLN
10.0000 [IU] | Freq: Every day | SUBCUTANEOUS | Status: DC
  Administered 2020-06-29 – 2020-06-30 (×2): 10 [IU] via SUBCUTANEOUS
  Filled 2020-06-29 (×3): qty 0.1

## 2020-06-29 NOTE — Progress Notes (Signed)
PROGRESS NOTE    Vanessa Gibson  SNK:[id number] DOB: 16-Jun-1928 DOA: 06/25/2020 PCP: Tammi Sou, MD  Brief Narrative:  61 white female born in [place] originally from [place] living [facility] Unwitnessed fall with right hip fracture laceration left jaw pressure bandage was placed received fentanyl MI 1999 HLD chronic vertigo, chronic pancreatitis?  Nonalcoholic ?  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  Vertigo related the placed 30-day Holter monitor at the time Recent visit to primary care physician 06/15/2020-major addressed issues at the time major depression chronic fatigue-had seen dermatology with 2 lesions removed from head and neck mid July where they increased Ditropan XL to 10 mg daily  Assessment & Plan:  Principal Problem:   Closed displaced fracture of right femoral neck (HCC) Active Problems:   Hypertension associated with diabetes ([place]), on Lisinopril   CKD stage 3 secondary to diabetes ([place]), on Lisinopril   Uncontrolled type 2 diabetes mellitus with hyperglycemia, with long-term current use of insulin ([place])   Acute right hip fracture with ambulatory dysfunction, likely mechanical fall - ORIF per orthopedic surgery, DVT prophylaxis and pain management per their team as well, appreciate insight and recommendations - PT following for disposition, recommending SNF however patient is requesting discharge home back to ALF - Advance diet as tolerated  Hypertension - Continue amlodipine 5, metoprolol 12.5 twice daily - Pressures are well controlled at this time  Mild neck laceration without any complication - This is from a dermatological procedure which had scarred over and then seem to open up after her fall - If she goes to skilled facility this can be removed in 7 days, if not, ALF can arrange for PCP to see in 5 to 7 days to remove the same  Mild periop  anemia and leukemoid reaction CBC Latest Ref Rng & Units 06/28/2020 06/27/2020 06/26/2020  WBC 4.0 - 10.5 K/uL 11.4(H) 11.2(H) 14.0(H)  Hemoglobin 12.0 - 15.0 g/dL 7.5(L) 7.6(L) 10.5(L)  Hematocrit 36 - 46 % 22.6(L) 22.0(L) 31.8(L)  Platelets 150 - 400 K/uL 171 125(L) 231   CAD w/ history of MI 1999 - Empiric aspirin to continue-no further info available  DM 2 with complication of neuropathy, poorly controlled - On Tresiba which is non-formulary - continue glargine 10u am - Continue sliding scale insulin hypoglycemic protocol Lab Results  Component Value Date   HGBA1C 6.4 (A) 06/04/2020  - Continue Lyrica for neuropathy  Major depressive disorder - Continue Celexa 20, Ativan 0.5 at bedtime - Consider de-escalation off of Ativan in the outpatient setting if possible  Chronic vertigo Questionable pancreatitis - ruled out Prior DCIS age 67 Adrenal incidentaloma   DVT prophylaxis: SCDs as per orthopedics Code Status: Full Family Communication: [name] updated over the phone - agreeable to CIR/SNF - currently awaiting available bed and insurance authorization  Disposition:  Status is: Inpatient Remains inpatient appropriate because:Ongoing active pain requiring inpatient pain management Dispo: The patient is from: Home              Anticipated d/c is to: SNF              Anticipated d/c date is: 1 day              Patient currently is not medically stable to d/c.   Consultants:  Orthopedics  Procedures:  ORIF Right Hip  Antimicrobials:  Perioperative as per Ortho  Subjective: No acute issues or  events overnight,  denies nausea, vomiting, headache, fever, chills.  Objective: Vitals:   06/28/20 0349 06/28/20 0729 06/28/20 1532 06/28/20 2032  BP: (!) 131/55 (!) 131/54 (!) 135/54 (!) 146/49  Pulse: 89 94 87 96  Resp: 17 15 17 16   Temp: 98.3 F (36.8 C) 98.9 F (37.2 C) 98.2 F (36.8 C) 99.9 F (37.7 C)  TempSrc: Oral Oral Oral Oral  SpO2: 96% 95% 96% 98%  Weight:        Height:        Intake/Output Summary (Last 24 hours) at 06/29/2020 4098 Last data filed at 06/28/2020 1700 Gross per 24 hour  Intake 720 ml  Output --  Net 720 ml   Filed Weights   06/25/20 2012  Weight: 60.3 kg    Examination:  General exam: Pleasant alert oriented no distress EOMI NCAT Left neck has sutures in place from laceration mild next laceration Respiratory system: Clear no added sound Cardiovascular system: S1-S2 no murmur rub or gallop Gastrointestinal system: Soft nontender no rebound. Central nervous system: Intact no focal deficit Extremities: No lower extremity edema Skin: Wound to left neck as above Psychiatry: Euthymic pleasant  Data Reviewed:   CBC Latest Ref Rng & Units 06/28/2020 06/27/2020 06/26/2020  WBC 4.0 - 10.5 K/uL 11.4(H) 11.2(H) 14.0(H)  Hemoglobin 12.0 - 15.0 g/dL 7.5(L) 7.6(L) 10.5(L)  Hematocrit 36 - 46 % 22.6(L) 22.0(L) 31.8(L)  Platelets 150 - 400 K/uL 171 125(L) 119   Last metabolic panel Lab Results  Component Value Date   GLUCOSE 228 (H) 06/28/2020   NA 132 (L) 06/28/2020   K 4.8 06/28/2020   CL 101 06/28/2020   CO2 25 06/28/2020   BUN 23 06/28/2020   CREATININE 1.20 (H) 06/28/2020   GFRNONAA 40 (L) 06/28/2020   GFRAA 46 (L) 06/28/2020   CALCIUM 8.2 (L) 06/28/2020   PROT 6.2 (L) 06/25/2020   ALBUMIN 3.5 06/25/2020   BILITOT 0.6 06/25/2020   ALKPHOS 57 06/25/2020   AST 29 06/25/2020   ALT 19 06/25/2020   ANIONGAP 6 06/28/2020     Radiology Studies: No results found.  Scheduled Meds: . amLODipine  5 mg Oral Daily  . aspirin EC  81 mg Oral Daily  . bisacodyl  10 mg Rectal Once  . citalopram  20 mg Oral Daily  . docusate sodium  100 mg Oral BID  . enoxaparin (LOVENOX) injection  30 mg Subcutaneous Q24H  . feeding supplement (GLUCERNA SHAKE)  237 mL Oral BID BM  . fluticasone  2 spray Each Nare Daily  . insulin aspart  0-5 Units Subcutaneous QHS  . insulin aspart  0-9 Units Subcutaneous TID WC  . loratadine  5 mg Oral  Daily  . LORazepam  0.5 mg Oral QHS  . metoprolol tartrate  12.5 mg Oral BID  . montelukast  10 mg Oral QHS  . multivitamin with minerals  1 tablet Oral Daily  . mupirocin ointment  1 application Topical BID  . olopatadine  1 drop Both Eyes BID  . oxybutynin  10 mg Oral QHS  . pantoprazole  40 mg Oral Daily  . pravastatin  20 mg Oral q1800  . pregabalin  25 mg Oral QPC supper  . traMADol  50 mg Oral QHS   Continuous Infusions: . sodium chloride 75 mL/hr at 06/27/20 1652     LOS: 4 days   Time spent: [place], DO [facility] To contact the attending provider between 7A-7P or the covering provider  during after hours 7P-7A, please log into the web site [email or web address] and access using universal [place] password for that web site. If you do not have the password, please call the hospital operator.  06/29/2020, 8:11 AM

## 2020-06-29 NOTE — Progress Notes (Signed)
Physical Therapy Treatment Patient Details Name: Vanessa Gibson MRN: [id number] DOB: 26-Sep-1928 Today's Date: 06/29/2020    History of Present Illness Pt is an 86 y.o. female admitted 06/25/20 after fall sustaining R femoral neck fx, L neck laceration. S/p R hip IMN 8/3. PMH includes DM2, HTN, CKD 3, breast CX, OA, MI, falls vertigo-related(?).    PT Comments    Pt in bed upon arrival of PT, agreeable to session with focus on progressing ambulation and functional independence with transfers. The pt was able to demo improvement in sit-stand transfers and stand-pivot transfers, requiring less physical assistance to complete at this time, but continues to be limited in ambulation endurance and dynamic stability at this time and is limited to bouts of ambulation in her room with use of a RW. The pt will continue to benefit from skilled PT to further progress functional strength, stability, and endurance prior to return home to facilitate return to safety and independence with mobility.     Follow Up Recommendations  Supervision/Assistance - 24 hour;SNF     Equipment Recommendations   (defer to post acute)    Recommendations for Other Services       Precautions / Restrictions Precautions Precautions: Fall Restrictions Weight Bearing Restrictions: Yes RLE Weight Bearing: Weight bearing as tolerated    Mobility  Bed Mobility Overal bed mobility: Needs Assistance Bed Mobility: Supine to Sit     Supine to sit: Mod assist     General bed mobility comments: minA to RLE to move to EOB, but pt reaching for HHA to pull to sitting rather than use of bed rail. pt then able to scoot to square at EOB without assist  Transfers Overall transfer level: Needs assistance Equipment used: Rolling walker (2 wheeled) Transfers: Sit to/from Stand Sit to Stand: Min assist         General transfer comment: cues for hand placement, minA-minG for safety to power up, no physical assist to steady once  standing  Ambulation/Gait Ambulation/Gait assistance: Min guard Gait Distance (Feet): 20 Feet Assistive device: Rolling walker (2 wheeled) Gait Pattern/deviations: Step-to pattern;Antalgic;Decreased weight shift to right;Trunk flexed;Leaning posteriorly Gait velocity: Decreased Gait velocity interpretation: <1.31 ft/sec, indicative of household ambulator General Gait Details: Pt required increased assistance to minA as she fatigued.  Cues for posture. pt reports increased chest pressure with exertion, but this resolved with seated rest      Balance Overall balance assessment: Needs assistance Sitting-balance support: No upper extremity supported;Feet unsupported Sitting balance-Leahy Scale: Fair     Standing balance support: During functional activity;Bilateral upper extremity supported   Standing balance comment: Reliant on UE support on walker                            Cognition Arousal/Alertness: Awake/alert Behavior During Therapy: WFL for tasks assessed/performed Overall Cognitive Status: Within Functional Limits for tasks assessed                                 General Comments: WFL for simple tasks, not formally assessed, pt with significant HOH      Exercises      General Comments General comments (skin integrity, edema, etc.): c/o chest pressure with ambulation that resolved with rest, RN notified. BP: 137/59; HR: 78 bpm      Pertinent Vitals/Pain Pain Assessment: Faces Faces Pain Scale: Hurts little more Pain Location: R hip (pt  also complained of chest pressure with exertion, resolved with standing or seated rest, RN notified) Pain Descriptors / Indicators: Grimacing;Guarding;Sore Pain Intervention(s): Limited activity within patient's tolerance;Monitored during session;Repositioned           PT Goals (current goals can now be found in the care plan section) Acute Rehab PT Goals Patient Stated Goal: Improve independence PT  Goal Formulation: With patient Time For Goal Achievement: 07/11/20 Potential to Achieve Goals: Good Progress towards PT goals: Progressing toward goals    Frequency    Min 5X/week      PT Plan Current plan remains appropriate       AM-PAC PT "6 Clicks" Mobility   Outcome Measure  Help needed turning from your back to your side while in a flat bed without using bedrails?: A Little Help needed moving from lying on your back to sitting on the side of a flat bed without using bedrails?: A Little Help needed moving to and from a bed to a chair (including a wheelchair)?: A Lot Help needed standing up from a chair using your arms (e.g., wheelchair or bedside chair)?: A Little Help needed to walk in hospital room?: A Lot Help needed climbing 3-5 steps with a railing? : A Lot 6 Click Score: 15    End of Session Equipment Utilized During Treatment: Gait belt Activity Tolerance: Patient tolerated treatment well;Patient limited by pain Patient left: in chair;with call bell/phone within reach;with chair alarm set Nurse Communication: Mobility status PT Visit Diagnosis: Unsteadiness on feet (R26.81);Other abnormalities of gait and mobility (R26.89);Pain Pain - Right/Left: Right Pain - part of body: Hip     Time: 7341-9379 PT Time Calculation (min) (ACUTE ONLY): 26 min  Charges:  $Gait Training: 23-37 mins                     Karma Ganja, PT, DPT   Acute Rehabilitation Department Pager #: [phone]   Otho Bellows 06/29/2020, 11:24 AM

## 2020-06-30 DIAGNOSIS — I2510 Atherosclerotic heart disease of native coronary artery without angina pectoris: Secondary | ICD-10-CM

## 2020-06-30 DIAGNOSIS — D649 Anemia, unspecified: Secondary | ICD-10-CM

## 2020-06-30 DIAGNOSIS — F329 Major depressive disorder, single episode, unspecified: Secondary | ICD-10-CM

## 2020-06-30 LAB — GLUCOSE, CAPILLARY
Glucose-Capillary: 218 mg/dL — ABNORMAL HIGH (ref 70–99)
Glucose-Capillary: 233 mg/dL — ABNORMAL HIGH (ref 70–99)
Glucose-Capillary: 228 mg/dL — ABNORMAL HIGH (ref 70–99)

## 2020-06-30 LAB — VITAMIN D 25 HYDROXY (VIT D DEFICIENCY, FRACTURES): Vit D, 25-Hydroxy: 24.72 ng/mL — ABNORMAL LOW (ref 30–100)

## 2020-06-30 MED ORDER — INSULIN GLARGINE 100 UNIT/ML ~~LOC~~ SOLN
15.0000 [IU] | Freq: Every day | SUBCUTANEOUS | Status: DC
  Administered 2020-07-01 – 2020-07-05 (×5): 15 [IU] via SUBCUTANEOUS
  Filled 2020-06-30 (×7): qty 0.15

## 2020-06-30 MED ORDER — INSULIN GLARGINE 100 UNIT/ML ~~LOC~~ SOLN
5.0000 [IU] | Freq: Once | SUBCUTANEOUS | Status: AC
  Administered 2020-06-30: 5 [IU] via SUBCUTANEOUS
  Filled 2020-06-30 (×2): qty 0.05

## 2020-06-30 NOTE — Progress Notes (Signed)
Inpatient Diabetes Program Recommendations  AACE/ADA: New Consensus Statement on Inpatient Glycemic Control   Target Ranges:  Prepandial:   less than 140 mg/dL      Peak postprandial:   less than 180 mg/dL (1-2 hours)      Critically ill patients:  140 - 180 mg/dL   Results for Vanessa Gibson, Vanessa Gibson (MRN [id number]) as of 06/30/2020 13:13  Ref. Range 06/29/2020 06:44 06/29/2020 11:36 06/29/2020 16:11 06/29/2020 19:59 06/30/2020 06:37 06/30/2020 12:02  Glucose-Capillary Latest Ref Range: 70 - 99 mg/dL 228 (H) 222 (H) 214 (H) 225 (H) 218 (H) 233 (H)   Review of Glycemic Control  Diabetes history: DM2 Outpatient Diabetes medications: Tresiba 16 units daily, Novolog 6 units BID Current orders for Inpatient glycemic control: Lantus 10 units daily, Novolog 0-9 units TID with meals, Novolog 0-5 units QHS  Inpatient Diabetes Program Recommendations:    Insulin-Please consider increasing Lantus to 15 units daily and Novolog 3 units TID with meals for meal coverage if patient eats at least 50% of meals.  NOTE: Noted consult for Diabetes Coordinator. Diabetes Coordinator is not on campus over the weekend but available by pager from 8am to 5pm for questions or concerns. Chart reviewed and recommendations made.  Thanks, Barnie Alderman, RN, MSN, CDE Diabetes Coordinator Inpatient Diabetes Program [phone] (Team Pager from 8am to 5pm)

## 2020-06-30 NOTE — Progress Notes (Signed)
PROGRESS NOTE    Vanessa Gibson  DZH:[id number] DOB: 1928/07/21 DOA: 06/25/2020 PCP: Tammi Sou, MD   Brief Narrative:  52 white female born in [place] originally from [facility] Unwitnessed fall with right hip fracture laceration left jaw pressure bandage was placed received fentanyl MI 1999 HLD chronic vertigo, chronic pancreatitis?  Nonalcoholic ?  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  Vertigo related the placed 30-day Holter monitor at the time Recent visit to primary care physician 06/15/2020-major addressed issues at the time major depression chronic fatigue-had seen dermatology with 2 lesions removed from head and neck mid July where they increased Ditropan XL to 10 mg daily   Assessment & Plan:   Principal Problem:   Closed displaced fracture of right femoral neck (HCC) Active Problems:   Hypertension associated with diabetes ([place]), on Lisinopril   CKD stage 3 secondary to diabetes ([place]), on Lisinopril   Uncontrolled type 2 diabetes mellitus with hyperglycemia, with long-term current use of insulin ([place])   Acute right hip fracture with ambulatory dysfunction, likely mechanical fall - ORIF per orthopedic surgery, DVT prophylaxis and pain management per their team as well, appreciate insight and recommendations - PT following for disposition, recommending SNF however patient is requesting discharge home back to ALF - Advance diet as tolerated  Hypertension - Continue amlodipine 5, metoprolol 12.5 twice daily - Pressures are well controlled at this time  Mild neck laceration without any complication - This is from a dermatological procedure which had scarred over and then seem to open up after her fall - If she goes to skilled facility this can be removed in 7 days on 8/10-11, if not, ALF can arrange for PCP to see in 5 to 7 days to remove the  same  Mild periop anemia and leukemoid reaction Hgb 7.5<7.6, wbc 11.4<11.2 Monitor  CAD w/ history of MI 1999 - Empiric aspirin to continue-no further info available  DM 2 with complication of neuropathy, poorly controlled - On Tresiba which is non-formulary - continue glargine 10u am - Continue sliding scale insulin hypoglycemic protocol - Continue Lyrica for neuropathy  Major depressive disorder - Continue Celexa 20, Ativan 0.5 at bedtime - Consider de-escalation off of Ativan in the outpatient setting if possible  Chronic vertigo Questionable pancreatitis - ruled out Prior DCIS age 86 Adrenal incidentaloma   DVT prophylaxis: SCD/Compression stockings  Code Status: full    Code Status Orders  (From admission, onward)         Start     Ordered   06/26/20 1326  Full code  Continuous        06/26/20 1325        Code Status History    Date Active Date Inactive Code Status Order ID Comments User Context   06/25/2020 2151 06/26/2020 1325 Full Code [id number]  Etta Quill, DO ED   11/12/2015 1738 11/14/2015 2122 Full Code [id number]  Vickii Chafe, MD Inpatient   Advance Care Planning Activity     Family Communication: discussed with son today Disposition Plan:   Status is: Inpatient  Remains inpatient appropriate because:Unsafe d/c plan   Dispo: The patient is from: ALF              Anticipated d/c is to: SNF              Anticipated d/c date is: 2 days  Patient currently is not medically stable to d/c.       Consults called: None Admission status: Inpatient   Consultants:   ortho  Procedures:  DG Chest 1 View  Result Date: 06/25/2020 CLINICAL DATA:  86 year old female with fall and right hip pain. EXAM: CHEST  1 VIEW COMPARISON:  Chest radiograph dated 04/20/2018. FINDINGS: No focal consolidation, pleural effusion, or pneumothorax. There is mild cardiomegaly. Atherosclerotic calcification of the aorta. No acute osseous  pathology. IMPRESSION: No acute cardiopulmonary process. Electronically Signed   By: Anner Crete M.D.   On: 06/25/2020 21:07   CT Angio Neck W and/or Wo Contrast  Result Date: 06/25/2020 CLINICAL DATA:  Moderate to severe neck trauma. Laceration to both sides of the neck. Fall. EXAM: CT ANGIOGRAPHY NECK TECHNIQUE: Multidetector CT imaging of the neck was performed using the standard protocol during bolus administration of intravenous contrast. Multiplanar CT image reconstructions and MIPs were obtained to evaluate the vascular anatomy. Carotid stenosis measurements (when applicable) are obtained utilizing NASCET criteria, using the distal internal carotid diameter as the denominator. CONTRAST:  37mL OMNIPAQUE IOHEXOL 350 MG/ML SOLN COMPARISON:  None. FINDINGS: Aortic arch: The left common carotid artery and innominate artery share a common origin. Minimal atherosclerotic changes present at the origin of the left subclavian artery. Right carotid system: Right common carotid artery is within normal limits. Bifurcation is unremarkable. Cervical right ICA is normal. Intracranial atherosclerotic changes are present without significant stenosis. Left carotid system: The left common carotid artery is within normal limits. Bifurcation is unremarkable. Mild tortuosity is present in the cervical left ICA without significant stenosis. Intracranial atherosclerotic changes are present without significant stenosis. Vertebral arteries: Left vertebral artery is the dominant vessel. Both vertebral arteries originate from the subclavian arteries without significant stenosis. No significant stenosis is present in either vertebral artery in the neck. Vertebrobasilar junction is normal. Visualized basilar artery is unremarkable. Skeleton: Multilevel degenerative changes are present cervical spine. Chronic loss of disc height is noted from C2-T1. No focal lytic or blastic lesions are present. Other neck: Left submandibular  laceration is noted. Stranding is present within the subcutaneous fat. No deep penetration is evident. Upper chest: Patchy ground-glass attenuation is present at the lung apices bilaterally. Thoracic inlet is normal. IMPRESSION: 1. Left submandibular laceration with stranding within the subcutaneous fat. No deep penetration. 2. No acute vascular injury. 3. No significant vascular disease in the neck. 4. Atherosclerotic changes within the cavernous internal carotid arteries bilaterally without significant stenosis. 5. Multilevel degenerative changes of the cervical spine. 6. Patchy ground-glass attenuation at the lung apices bilaterally. This may represent atelectasis or edema. Infection is not excluded. Electronically Signed   By: San Morelle M.D.   On: 06/25/2020 21:42   DG C-Arm 1-60 Min  Result Date: 06/26/2020 CLINICAL DATA:  ORIF. EXAM: RIGHT FEMUR 2 VIEWS; DG C-ARM 1-60 MIN COMPARISON:  06/25/2020. FINDINGS: ORIF right femur. Hardware intact. Anatomic alignment. Peripheral vascular calcification. IMPRESSION: ORIF right femur with anatomic alignment. Electronically Signed   By: Marcello Moores  Register   On: 06/26/2020 12:55   Intravitreal Injection, Pharmacologic Agent - OS - Left Eye  Result Date: 06/06/2020 Time Out 06/06/2020. 11:47 AM. Confirmed correct patient, procedure, site, and patient consented. Anesthesia Topical anesthesia was used. Anesthetic medications included Akten 3.5%. Procedure Preparation included Ofloxacin , 10% betadine to eyelids, 5% betadine to ocular surface. A 30 gauge needle was used. Injection: 1.25 mg Bevacizumab (AVASTIN) SOLN   NDC: [address]-2, Lot: [id number]   Route: Intravitreal, Site: Left  Eye, Waste: 0 mg Post-op Post injection exam found visual acuity of at least counting fingers. The patient tolerated the procedure well. There were no complications. The patient received written and verbal post procedure care education. Post injection medications were not given.    OCT, Retina - OU - Both Eyes  Result Date: 06/06/2020 Right Eye Quality was good. Scan locations included subfoveal. Central Foveal Thickness: 286. Left Eye Quality was good. Scan locations included subfoveal. Central Foveal Thickness: 268. Findings include no SRF, intraretinal fluid. Notes OS, overall improved, watch region superotemporal to the fovea with small amount of intraretinal fluid.  Still stable at 9 weeks.  We will repeat injection today extend exam interval to 10 weeks  DG Hip Unilat W or Wo Pelvis 2-3 Views Right  Result Date: 06/25/2020 CLINICAL DATA:  86 year old female with fall and right hip pain. EXAM: DG HIP (WITH OR WITHOUT PELVIS) 2-3V RIGHT COMPARISON:  None. FINDINGS: There is a mildly displaced, comminuted and angulated fracture of the right femoral neck. The bones are osteopenic. There is no dislocation. The soft tissues are unremarkable. IMPRESSION: Right femoral neck fracture. Electronically Signed   By: Anner Crete M.D.   On: 06/25/2020 21:08   DG FEMUR, MIN 2 VIEWS RIGHT  Result Date: 06/26/2020 CLINICAL DATA:  ORIF. EXAM: RIGHT FEMUR 2 VIEWS; DG C-ARM 1-60 MIN COMPARISON:  06/25/2020. FINDINGS: ORIF right femur. Hardware intact. Anatomic alignment. Peripheral vascular calcification. IMPRESSION: ORIF right femur with anatomic alignment. Electronically Signed   By: Marcello Moores  Register   On: 06/26/2020 12:55   DG FEMUR PORT, MIN 2 VIEWS RIGHT  Result Date: 06/26/2020 CLINICAL DATA:  Intramedullary nail placement EXAM: RIGHT FEMUR PORTABLE 2 VIEW COMPARISON:  June 25, 2020 and intraoperative images June 26, 2020 FINDINGS: Frontal and lateral views were obtained. There is screw and rod fixation through an intertrochanteric femur fracture on the right. Alignment at the fracture site is near anatomic. Screw tip in proximal femoral head. No new fracture. No dislocation. No appreciable knee joint effusion. Osteoarthritic change noted in the knee joint region. IMPRESSION:  Screw and nail fixation through intertrochanteric femur fracture. No new fracture. No dislocation. Arthropathy in the right knee joint region noted. Electronically Signed   By: Lowella Grip III M.D.   On: 06/26/2020 13:51     Antimicrobials:   none   Subjective: No acute evnets ovenright, reports pain is well controlled  Objective: Vitals:   06/29/20 2000 06/30/20 0457 06/30/20 0816 06/30/20 1354  BP: (!) 127/55 (!) 139/57 (!) 158/59 111/65  Pulse: 93 92 98 88  Resp: 16 16 20 18   Temp: 98.1 F (36.7 C) 98 F (36.7 C) 98.9 F (37.2 C) 99.6 F (37.6 C)  TempSrc: Oral Oral Oral Oral  SpO2: 94% 98% 94% 98%  Weight:      Height:        Intake/Output Summary (Last 24 hours) at 06/30/2020 1459 Last data filed at 06/29/2020 1900 Gross per 24 hour  Intake 240 ml  Output --  Net 240 ml   Filed Weights   06/25/20 2012  Weight: 60.3 kg    Examination:  General exam: Pleasant alert oriented no distress EOMI NCAT Left neck has sutures in place from laceration mild next laceration Respiratory system: Clear no added sound Cardiovascular system: S1-S2 no murmur rub or gallop Gastrointestinal system: Soft nontender no rebound. Central nervous system: Intact no focal deficit Extremities: No lower extremity edema Skin: Wound to left neck as above Psychiatry: Euthymic pleasant  Data Reviewed: I have personally reviewed following labs and imaging studies  CBC: Recent Labs  Lab 06/25/20 2036 06/26/20 0224 06/27/20 0611 06/28/20 0507  WBC 10.4 14.0* 11.2* 11.4*  NEUTROABS 7.6  --   --   --   HGB 11.7* 10.5* 7.6* 7.5*  HCT 35.4* 31.8* 22.0* 22.6*  MCV 95.4 94.6 91.7 95.0  PLT 257 231 125* 329   Basic Metabolic Panel: Recent Labs  Lab 06/25/20 2036 06/26/20 0224 06/27/20 0331 06/28/20 0507  NA 130* 131* 132* 132*  K 4.5 4.7 4.9 4.8  CL 97* 97* 98 101  CO2 24 23 26 25   GLUCOSE 259* 200* 156* 228*  BUN 21 23 26* 23  CREATININE 1.22* 1.16* 1.44* 1.20*   CALCIUM 8.8* 8.8* 8.2* 8.2*   GFR: Estimated Creatinine Clearance: 25.8 mL/min (A) (by C-G formula based on SCr of 1.2 mg/dL (H)). Liver Function Tests: Recent Labs  Lab 06/25/20 2036  AST 29  ALT 19  ALKPHOS 57  BILITOT 0.6  PROT 6.2*  ALBUMIN 3.5   No results for input(s): LIPASE, AMYLASE in the last 168 hours. No results for input(s): AMMONIA in the last 168 hours. Coagulation Profile: Recent Labs  Lab 06/25/20 2036  INR 1.1   Cardiac Enzymes: No results for input(s): CKTOTAL, CKMB, CKMBINDEX, TROPONINI in the last 168 hours. BNP (last 3 results) No results for input(s): PROBNP in the last 8760 hours. HbA1C: No results for input(s): HGBA1C in the last 72 hours. CBG: Recent Labs  Lab 06/29/20 1136 06/29/20 1611 06/29/20 1959 06/30/20 0637 06/30/20 1202  GLUCAP 222* 214* 225* 218* 233*   Lipid Profile: No results for input(s): CHOL, HDL, LDLCALC, TRIG, CHOLHDL, LDLDIRECT in the last 72 hours. Thyroid Function Tests: No results for input(s): TSH, T4TOTAL, FREET4, T3FREE, THYROIDAB in the last 72 hours. Anemia Panel: No results for input(s): VITAMINB12, FOLATE, FERRITIN, TIBC, IRON, RETICCTPCT in the last 72 hours. Sepsis Labs: No results for input(s): PROCALCITON, LATICACIDVEN in the last 168 hours.  Recent Results (from the past 240 hour(s))  SARS Coronavirus 2 by RT PCR (hospital order, performed in [facility] hospital lab) Nasopharyngeal Nasopharyngeal Swab     Status: None   Collection Time: 06/25/20  9:48 PM   Specimen: Nasopharyngeal Swab  Result Value Ref Range Status   SARS Coronavirus 2 NEGATIVE NEGATIVE Final    Comment: (NOTE) SARS-CoV-2 target nucleic acids are NOT DETECTED.  The SARS-CoV-2 RNA is generally detectable in upper and lower respiratory specimens during the acute phase of infection. The lowest concentration of SARS-CoV-2 viral copies this assay can detect is 250 copies / mL. A negative result does not preclude SARS-CoV-2  infection and should not be used as the sole basis for treatment or other patient management decisions.  A negative result may occur with improper specimen collection / handling, submission of specimen other than nasopharyngeal swab, presence of viral mutation(s) within the areas targeted by this assay, and inadequate number of viral copies (<250 copies / mL). A negative result must be combined with clinical observations, patient history, and epidemiological information.  Fact Sheet for Patients:   StrictlyIdeas.no  Fact Sheet for Healthcare Providers: BankingDealers.co.za  This test is not yet approved or  cleared by the [place] FDA and has been authorized for detection and/or diagnosis of SARS-CoV-2 by FDA under an Emergency Use Authorization (EUA).  This EUA will remain in effect (meaning this test can be used) for the duration of the COVID-19 declaration under Section 564(b)(1) of  the Act, 21 U.S.C. section 360bbb-3(b)(1), unless the authorization is terminated or revoked sooner.  Performed at [facility], [place] [address] [place], [place] [address]   Surgical pcr screen     Status: None   Collection Time: 06/25/20 11:41 PM   Specimen: Nasal Mucosa; Nasal Swab  Result Value Ref Range Status   MRSA, PCR NEGATIVE NEGATIVE Final   Staphylococcus aureus NEGATIVE NEGATIVE Final    Comment: (NOTE) The Xpert SA Assay (FDA approved for NASAL specimens in patients 66 years of age and older), is one component of a comprehensive surveillance program. It is not intended to diagnose infection nor to guide or monitor treatment. Performed at [facility], [place] [address], [place], [place] [address]          Radiology Studies: No results found.      Scheduled Meds: . amLODipine  5 mg Oral Daily  . aspirin EC  81 mg Oral Daily  . bisacodyl  10 mg Rectal Once  . citalopram  20 mg Oral Daily  . docusate sodium   100 mg Oral BID  . enoxaparin (LOVENOX) injection  30 mg Subcutaneous Q24H  . feeding supplement (GLUCERNA SHAKE)  237 mL Oral BID BM  . fluticasone  2 spray Each Nare Daily  . insulin aspart  0-5 Units Subcutaneous QHS  . insulin aspart  0-9 Units Subcutaneous TID WC  . [START ON 07/01/2020] insulin glargine  15 Units Subcutaneous Daily  . insulin glargine  5 Units Subcutaneous Once  . loratadine  5 mg Oral Daily  . LORazepam  0.5 mg Oral QHS  . metoprolol tartrate  12.5 mg Oral BID  . montelukast  10 mg Oral QHS  . multivitamin with minerals  1 tablet Oral Daily  . mupirocin ointment  1 application Topical BID  . olopatadine  1 drop Both Eyes BID  . oxybutynin  10 mg Oral QHS  . pantoprazole  40 mg Oral Daily  . pravastatin  20 mg Oral q1800  . pregabalin  25 mg Oral QPC supper  . traMADol  50 mg Oral QHS   Continuous Infusions: . sodium chloride 75 mL/hr at 06/27/20 1652     LOS: 5 days    Time spent: 35 min    Nicolette Bang, MD [facility]  If 7PM-7AM, please contact night-coverage  06/30/2020, 2:59 PM

## 2020-07-01 LAB — BASIC METABOLIC PANEL
Anion gap: 8 (ref 5–15)
BUN: 20 mg/dL (ref 8–23)
CO2: 25 mmol/L (ref 22–32)
Calcium: 8.5 mg/dL — ABNORMAL LOW (ref 8.9–10.3)
Chloride: 95 mmol/L — ABNORMAL LOW (ref 98–111)
Creatinine, Ser: 1.12 mg/dL — ABNORMAL HIGH (ref 0.44–1.00)
GFR calc Af Amer: 50 mL/min — ABNORMAL LOW (ref >60)
GFR calc non Af Amer: 44 mL/min — ABNORMAL LOW (ref >60)
Glucose, Bld: 218 mg/dL — ABNORMAL HIGH (ref 70–99)
Potassium: 3.9 mmol/L (ref 3.5–5.1)
Sodium: 128 mmol/L — ABNORMAL LOW (ref 135–145)

## 2020-07-01 LAB — CBC
HCT: 22.6 % — ABNORMAL LOW (ref 36.0–46.0)
Hemoglobin: 7.5 g/dL — ABNORMAL LOW (ref 12.0–15.0)
MCH: 30.7 pg (ref 26.0–34.0)
MCHC: 33.2 g/dL (ref 30.0–36.0)
MCV: 92.6 fL (ref 80.0–100.0)
Platelets: 282 10*3/uL (ref 150–400)
RBC: 2.44 MIL/uL — ABNORMAL LOW (ref 3.87–5.11)
RDW: 12.9 % (ref 11.5–15.5)
WBC: 13.3 10*3/uL — ABNORMAL HIGH (ref 4.0–10.5)
nRBC: 0 % (ref 0.0–0.2)

## 2020-07-01 LAB — GLUCOSE, CAPILLARY
Glucose-Capillary: 209 mg/dL — ABNORMAL HIGH (ref 70–99)
Glucose-Capillary: 195 mg/dL — ABNORMAL HIGH (ref 70–99)
Glucose-Capillary: 202 mg/dL — ABNORMAL HIGH (ref 70–99)
Glucose-Capillary: 149 mg/dL — ABNORMAL HIGH (ref 70–99)

## 2020-07-01 MED ORDER — POTASSIUM CHLORIDE IN NACL 20-0.9 MEQ/L-% IV SOLN
INTRAVENOUS | Status: DC
  Filled 2020-07-01: qty 1000

## 2020-07-01 NOTE — Progress Notes (Signed)
Inpatient Diabetes Program Recommendations  AACE/ADA: New Consensus Statement on Inpatient Glycemic Control   Target Ranges:  Prepandial:   less than 140 mg/dL      Peak postprandial:   less than 180 mg/dL (1-2 hours)      Critically ill patients:  140 - 180 mg/dL  Results for BRITNAY, MAGNUSSEN (MRN [id number]) as of 07/01/2020 10:27  Ref. Range 06/30/2020 06:37 06/30/2020 12:02 06/30/2020 16:49 07/01/2020 06:43  Glucose-Capillary Latest Ref Range: 70 - 99 mg/dL 218 (H) 233 (H) 228 (H) 209 (H)    Review of Glycemic Control  Diabetes history: DM2 Outpatient Diabetes medications: Tresiba 16 units daily, Novolog 6 units BID Current orders for Inpatient glycemic control: Lantus 15 units daily, Novolog 0-9 units TID with meals, Novolog 0-5 units QHS  Inpatient Diabetes Program Recommendations:    Insulin-Please consider increasing Lantus to 18 units daily and Novolog 3 units TID with meals for meal coverage if patient eats at least 50% of meals.  Thanks,  Barnie Alderman, RN, MSN, CDE Diabetes Coordinator Inpatient Diabetes Program [phone] (Team Pager from 8am to 5pm)

## 2020-07-01 NOTE — Progress Notes (Signed)
PROGRESS NOTE    Vanessa Gibson  QJJ:[id number] DOB: 1928/07/16 DOA: 06/25/2020 PCP: Tammi Sou, MD   Brief Narrative:  86 white female born in [place] originally from [place] living [facility] Unwitnessed fall with right hip fracture laceration left jaw pressure bandage was placed received fentanyl MI 1999 HLD chronic vertigo, chronic pancreatitis? Nonalcoholic ? 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 Vertigo related the placed 30-day Holter monitor at the time Recent visit to primary care physician 06/15/2020-major addressed issues at the time major depression chronic fatigue-had seen dermatology with 2 lesions removed from head and neck mid July where they increased Ditropan XL to 10 mg daily   Assessment & Plan:   Principal Problem:   Closed displaced fracture of right femoral neck (HCC) Active Problems:   Hypertension associated with diabetes ([place]), on Lisinopril   CKD stage 3 secondary to diabetes ([place]), on Lisinopril   Uncontrolled type 2 diabetes mellitus with hyperglycemia, with long-term current use of insulin ([place])   Acute right hip fracture with ambulatory dysfunction, likely mechanical fall - ORIF per orthopedic surgery, DVT prophylaxis and pain management per their team as well, appreciate insight and recommendations - PT following for disposition, recommending SNF however patient is requesting discharge home back to ALF - Advance diet as tolerated  Hypertension - Continue amlodipine 5, metoprolol 12.5 twice daily - Pressures are well controlled at this time  Mild neck laceration without any complication - This is from a dermatological procedure which had scarred over and then seem to open up after her fall - If she goes to skilled facility this can be removed in 7 days on 8/10-11, if not, ALF can arrange for PCP to see in 5 to 7 days to remove the  same  Mild periop anemia and leukemoid reaction Hgb 7.5<7.6, wbc 11.4<11.2 Monitor  Hyponatremia: likel 2/2 decreased po intake, will add NS 75 recheck bmp in am  CAD w/ history of MI 1999 - Empiric aspirin to continue-no further info available  DM 2 with complication of neuropathy, poorly controlled - On Tresiba which is non-formulary- continue glargine 10u am - Continue sliding scale insulin hypoglycemic protocol - Continue Lyrica for neuropathy  Major depressive disorder - Continue Celexa 20, Ativan 0.5 at bedtime - Consider de-escalation off of Ativan in the outpatient setting if possible  Chronic vertigo Questionable pancreatitis- ruled out Prior DCIS age 86 Adrenal incidentaloma   DVT prophylaxis: SCD/Compression stockings  Code Status: fall    Code Status Orders  (From admission, onward)         Start     Ordered   06/26/20 1326  Full code  Continuous        06/26/20 1325        Code Status History    Date Active Date Inactive Code Status Order ID Comments User Context   06/25/2020 2151 06/26/2020 1325 Full Code [id number]  Etta Quill, DO ED   11/12/2015 1738 11/14/2015 2122 Full Code [id number]  Vickii Chafe, MD Inpatient   Advance Care Planning Activity     Family Communication: discussed with son Saturday, he is on board with snf  Disposition Plan:   Status is: Inpatient  Remains inpatient appropriate because:Unsafe d/c plan   Dispo: The patient is from: Home              Anticipated d/c is to: SNF  Anticipated d/c date is: 1 day              Patient currently is medically stable to d/c.       Consults called: ortho Admission status: Inpatient   Consultants:   as above  Procedures:  DG Chest 1 View  Result Date: 06/25/2020 CLINICAL DATA:  86 year old female with fall and right hip pain. EXAM: CHEST  1 VIEW COMPARISON:  Chest radiograph dated 04/20/2018. FINDINGS: No focal consolidation, pleural effusion,  or pneumothorax. There is mild cardiomegaly. Atherosclerotic calcification of the aorta. No acute osseous pathology. IMPRESSION: No acute cardiopulmonary process. Electronically Signed   By: Anner Crete M.D.   On: 06/25/2020 21:07   CT Angio Neck W and/or Wo Contrast  Result Date: 06/25/2020 CLINICAL DATA:  Moderate to severe neck trauma. Laceration to both sides of the neck. Fall. EXAM: CT ANGIOGRAPHY NECK TECHNIQUE: Multidetector CT imaging of the neck was performed using the standard protocol during bolus administration of intravenous contrast. Multiplanar CT image reconstructions and MIPs were obtained to evaluate the vascular anatomy. Carotid stenosis measurements (when applicable) are obtained utilizing NASCET criteria, using the distal internal carotid diameter as the denominator. CONTRAST:  38mL OMNIPAQUE IOHEXOL 350 MG/ML SOLN COMPARISON:  None. FINDINGS: Aortic arch: The left common carotid artery and innominate artery share a common origin. Minimal atherosclerotic changes present at the origin of the left subclavian artery. Right carotid system: Right common carotid artery is within normal limits. Bifurcation is unremarkable. Cervical right ICA is normal. Intracranial atherosclerotic changes are present without significant stenosis. Left carotid system: The left common carotid artery is within normal limits. Bifurcation is unremarkable. Mild tortuosity is present in the cervical left ICA without significant stenosis. Intracranial atherosclerotic changes are present without significant stenosis. Vertebral arteries: Left vertebral artery is the dominant vessel. Both vertebral arteries originate from the subclavian arteries without significant stenosis. No significant stenosis is present in either vertebral artery in the neck. Vertebrobasilar junction is normal. Visualized basilar artery is unremarkable. Skeleton: Multilevel degenerative changes are present cervical spine. Chronic loss of disc height  is noted from C2-T1. No focal lytic or blastic lesions are present. Other neck: Left submandibular laceration is noted. Stranding is present within the subcutaneous fat. No deep penetration is evident. Upper chest: Patchy ground-glass attenuation is present at the lung apices bilaterally. Thoracic inlet is normal. IMPRESSION: 1. Left submandibular laceration with stranding within the subcutaneous fat. No deep penetration. 2. No acute vascular injury. 3. No significant vascular disease in the neck. 4. Atherosclerotic changes within the cavernous internal carotid arteries bilaterally without significant stenosis. 5. Multilevel degenerative changes of the cervical spine. 6. Patchy ground-glass attenuation at the lung apices bilaterally. This may represent atelectasis or edema. Infection is not excluded. Electronically Signed   By: San Morelle M.D.   On: 06/25/2020 21:42   DG C-Arm 1-60 Min  Result Date: 06/26/2020 CLINICAL DATA:  ORIF. EXAM: RIGHT FEMUR 2 VIEWS; DG C-ARM 1-60 MIN COMPARISON:  06/25/2020. FINDINGS: ORIF right femur. Hardware intact. Anatomic alignment. Peripheral vascular calcification. IMPRESSION: ORIF right femur with anatomic alignment. Electronically Signed   By: Marcello Moores  Register   On: 06/26/2020 12:55   Intravitreal Injection, Pharmacologic Agent - OS - Left Eye  Result Date: 06/06/2020 Time Out 06/06/2020. 11:47 AM. Confirmed correct patient, procedure, site, and patient consented. Anesthesia Topical anesthesia was used. Anesthetic medications included Akten 3.5%. Procedure Preparation included Ofloxacin , 10% betadine to eyelids, 5% betadine to ocular surface. A 30 gauge needle was  used. Injection: 1.25 mg Bevacizumab (AVASTIN) SOLN   NDC: [address]-7, Lot: [id number]   Route: Intravitreal, Site: Left Eye, Waste: 0 mg Post-op Post injection exam found visual acuity of at least counting fingers. The patient tolerated the procedure well. There were no complications. The patient received  written and verbal post procedure care education. Post injection medications were not given.   OCT, Retina - OU - Both Eyes  Result Date: 06/06/2020 Right Eye Quality was good. Scan locations included subfoveal. Central Foveal Thickness: 286. Left Eye Quality was good. Scan locations included subfoveal. Central Foveal Thickness: 268. Findings include no SRF, intraretinal fluid. Notes OS, overall improved, watch region superotemporal to the fovea with small amount of intraretinal fluid.  Still stable at 9 weeks.  We will repeat injection today extend exam interval to 10 weeks  DG Hip Unilat W or Wo Pelvis 2-3 Views Right  Result Date: 06/25/2020 CLINICAL DATA:  86 year old female with fall and right hip pain. EXAM: DG HIP (WITH OR WITHOUT PELVIS) 2-3V RIGHT COMPARISON:  None. FINDINGS: There is a mildly displaced, comminuted and angulated fracture of the right femoral neck. The bones are osteopenic. There is no dislocation. The soft tissues are unremarkable. IMPRESSION: Right femoral neck fracture. Electronically Signed   By: Anner Crete M.D.   On: 06/25/2020 21:08   DG FEMUR, MIN 2 VIEWS RIGHT  Result Date: 06/26/2020 CLINICAL DATA:  ORIF. EXAM: RIGHT FEMUR 2 VIEWS; DG C-ARM 1-60 MIN COMPARISON:  06/25/2020. FINDINGS: ORIF right femur. Hardware intact. Anatomic alignment. Peripheral vascular calcification. IMPRESSION: ORIF right femur with anatomic alignment. Electronically Signed   By: Marcello Moores  Register   On: 06/26/2020 12:55   DG FEMUR PORT, MIN 2 VIEWS RIGHT  Result Date: 06/26/2020 CLINICAL DATA:  Intramedullary nail placement EXAM: RIGHT FEMUR PORTABLE 2 VIEW COMPARISON:  June 25, 2020 and intraoperative images June 26, 2020 FINDINGS: Frontal and lateral views were obtained. There is screw and rod fixation through an intertrochanteric femur fracture on the right. Alignment at the fracture site is near anatomic. Screw tip in proximal femoral head. No new fracture. No dislocation. No  appreciable knee joint effusion. Osteoarthritic change noted in the knee joint region. IMPRESSION: Screw and nail fixation through intertrochanteric femur fracture. No new fracture. No dislocation. Arthropathy in the right knee joint region noted. Electronically Signed   By: Lowella Grip III M.D.   On: 06/26/2020 13:51     Antimicrobials:   none    Subjective: No acute events ovenright, rresting in bed comfortably  Objective: Vitals:   06/30/20 1354 06/30/20 1929 07/01/20 0417 07/01/20 0834  BP: 111/65 140/60 (!) 122/57 (!) 145/58  Pulse: 88 92 79 87  Resp: 18 16 16 16   Temp: 99.6 F (37.6 C) 100.1 F (37.8 C) 98.4 F (36.9 C) 99.2 F (37.3 C)  TempSrc: Oral Oral Oral Oral  SpO2: 98% 94% 97% 94%  Weight:      Height:        Intake/Output Summary (Last 24 hours) at 07/01/2020 1437 Last data filed at 07/01/2020 0900 Gross per 24 hour  Intake 360 ml  Output 300 ml  Net 60 ml   Filed Weights   06/25/20 2012  Weight: 60.3 kg    Examination: General exam:Pleasant alert oriented no distress EOMI NCAT Left neck has sutures in place from laceration mild next laceration Respiratory system: Clear no added sound Cardiovascular system:S1-S2 no murmur rub or gallop Gastrointestinal system:Soft nontender no rebound. Central nervous system:Intact no focal deficit Extremities:  No lower extremity edema Skin: Wound to left neck as above Psychiatry:Euthymic pleasant     Data Reviewed: I have personally reviewed following labs and imaging studies  CBC: Recent Labs  Lab 06/25/20 2036 06/26/20 0224 06/27/20 0611 06/28/20 0507 07/01/20 0738  WBC 10.4 14.0* 11.2* 11.4* 13.3*  NEUTROABS 7.6  --   --   --   --   HGB 11.7* 10.5* 7.6* 7.5* 7.5*  HCT 35.4* 31.8* 22.0* 22.6* 22.6*  MCV 95.4 94.6 91.7 95.0 92.6  PLT 257 231 125* 171 330   Basic Metabolic Panel: Recent Labs  Lab 06/25/20 2036 06/26/20 0224 06/27/20 0331 06/28/20 0507 07/01/20 0738  NA 130* 131*  132* 132* 128*  K 4.5 4.7 4.9 4.8 3.9  CL 97* 97* 98 101 95*  CO2 24 23 26 25 25   GLUCOSE 259* 200* 156* 228* 218*  BUN 21 23 26* 23 20  CREATININE 1.22* 1.16* 1.44* 1.20* 1.12*  CALCIUM 8.8* 8.8* 8.2* 8.2* 8.5*   GFR: Estimated Creatinine Clearance: 27.6 mL/min (A) (by C-G formula based on SCr of 1.12 mg/dL (H)). Liver Function Tests: Recent Labs  Lab 06/25/20 2036  AST 29  ALT 19  ALKPHOS 57  BILITOT 0.6  PROT 6.2*  ALBUMIN 3.5   No results for input(s): LIPASE, AMYLASE in the last 168 hours. No results for input(s): AMMONIA in the last 168 hours. Coagulation Profile: Recent Labs  Lab 06/25/20 2036  INR 1.1   Cardiac Enzymes: No results for input(s): CKTOTAL, CKMB, CKMBINDEX, TROPONINI in the last 168 hours. BNP (last 3 results) No results for input(s): PROBNP in the last 8760 hours. HbA1C: No results for input(s): HGBA1C in the last 72 hours. CBG: Recent Labs  Lab 06/30/20 0637 06/30/20 1202 06/30/20 1649 07/01/20 0643 07/01/20 1129  GLUCAP 218* 233* 228* 209* 195*   Lipid Profile: No results for input(s): CHOL, HDL, LDLCALC, TRIG, CHOLHDL, LDLDIRECT in the last 72 hours. Thyroid Function Tests: No results for input(s): TSH, T4TOTAL, FREET4, T3FREE, THYROIDAB in the last 72 hours. Anemia Panel: No results for input(s): VITAMINB12, FOLATE, FERRITIN, TIBC, IRON, RETICCTPCT in the last 72 hours. Sepsis Labs: No results for input(s): PROCALCITON, LATICACIDVEN in the last 168 hours.  Recent Results (from the past 240 hour(s))  SARS Coronavirus 2 by RT PCR (hospital order, performed in [facility] hospital lab) Nasopharyngeal Nasopharyngeal Swab     Status: None   Collection Time: 06/25/20  9:48 PM   Specimen: Nasopharyngeal Swab  Result Value Ref Range Status   SARS Coronavirus 2 NEGATIVE NEGATIVE Final    Comment: (NOTE) SARS-CoV-2 target nucleic acids are NOT DETECTED.  The SARS-CoV-2 RNA is generally detectable in upper and lower respiratory specimens  during the acute phase of infection. The lowest concentration of SARS-CoV-2 viral copies this assay can detect is 250 copies / mL. A negative result does not preclude SARS-CoV-2 infection and should not be used as the sole basis for treatment or other patient management decisions.  A negative result may occur with improper specimen collection / handling, submission of specimen other than nasopharyngeal swab, presence of viral mutation(s) within the areas targeted by this assay, and inadequate number of viral copies (<250 copies / mL). A negative result must be combined with clinical observations, patient history, and epidemiological information.  Fact Sheet for Patients:   StrictlyIdeas.no  Fact Sheet for Healthcare Providers: BankingDealers.co.za  This test is not yet approved or  cleared by the [place] FDA and has been authorized for  detection and/or diagnosis of SARS-CoV-2 by FDA under an Emergency Use Authorization (EUA).  This EUA will remain in effect (meaning this test can be used) for the duration of the COVID-19 declaration under Section 564(b)(1) of the Act, 21 U.S.C. section 360bbb-3(b)(1), unless the authorization is terminated or revoked sooner.  Performed at [facility], [place] [address] [place], [place] [address]   Surgical pcr screen     Status: None   Collection Time: 06/25/20 11:41 PM   Specimen: Nasal Mucosa; Nasal Swab  Result Value Ref Range Status   MRSA, PCR NEGATIVE NEGATIVE Final   Staphylococcus aureus NEGATIVE NEGATIVE Final    Comment: (NOTE) The Xpert SA Assay (FDA approved for NASAL specimens in patients 62 years of age and older), is one component of a comprehensive surveillance program. It is not intended to diagnose infection nor to guide or monitor treatment. Performed at [facility], [place] [address] [place],  [address]          Radiology Studies: No results  found.      Scheduled Meds: . amLODipine  5 mg Oral Daily  . aspirin EC  81 mg Oral Daily  . bisacodyl  10 mg Rectal Once  . citalopram  20 mg Oral Daily  . docusate sodium  100 mg Oral BID  . enoxaparin (LOVENOX) injection  30 mg Subcutaneous Q24H  . feeding supplement (GLUCERNA SHAKE)  237 mL Oral BID BM  . fluticasone  2 spray Each Nare Daily  . insulin aspart  0-5 Units Subcutaneous QHS  . insulin aspart  0-9 Units Subcutaneous TID WC  . insulin glargine  15 Units Subcutaneous Daily  . loratadine  5 mg Oral Daily  . LORazepam  0.5 mg Oral QHS  . metoprolol tartrate  12.5 mg Oral BID  . montelukast  10 mg Oral QHS  . multivitamin with minerals  1 tablet Oral Daily  . mupirocin ointment  1 application Topical BID  . olopatadine  1 drop Both Eyes BID  . oxybutynin  10 mg Oral QHS  . pantoprazole  40 mg Oral Daily  . pravastatin  20 mg Oral q1800  . pregabalin  25 mg Oral QPC supper  . traMADol  50 mg Oral QHS   Continuous Infusions: . sodium chloride 75 mL/hr at 06/27/20 1652  . 0.9 % NaCl with KCl 20 mEq / L 75 mL/hr at 07/01/20 0923     LOS: 6 days    Time spent: 35 min    Nicolette Bang, MD [facility]  If 7PM-7AM, please contact night-coverage  07/01/2020, 2:37 PM

## 2020-07-02 ENCOUNTER — Encounter (INDEPENDENT_AMBULATORY_CARE_PROVIDER_SITE_OTHER): Payer: Medicare HMO | Admitting: Ophthalmology

## 2020-07-02 DIAGNOSIS — E871 Hypo-osmolality and hyponatremia: Secondary | ICD-10-CM

## 2020-07-02 LAB — CBC WITH DIFFERENTIAL/PLATELET
Abs Immature Granulocytes: 0.26 10*3/uL — ABNORMAL HIGH (ref 0.00–0.07)
Basophils Absolute: 0.1 10*3/uL (ref 0.0–0.1)
Basophils Relative: 1 %
Eosinophils Absolute: 0.8 10*3/uL — ABNORMAL HIGH (ref 0.0–0.5)
Eosinophils Relative: 6 %
HCT: 23.1 % — ABNORMAL LOW (ref 36.0–46.0)
Hemoglobin: 7.6 g/dL — ABNORMAL LOW (ref 12.0–15.0)
Immature Granulocytes: 2 %
Lymphocytes Relative: 12 %
Lymphs Abs: 1.7 10*3/uL (ref 0.7–4.0)
MCH: 31.3 pg (ref 26.0–34.0)
MCHC: 32.9 g/dL (ref 30.0–36.0)
MCV: 95.1 fL (ref 80.0–100.0)
Monocytes Absolute: 1.4 10*3/uL — ABNORMAL HIGH (ref 0.1–1.0)
Monocytes Relative: 10 %
Neutro Abs: 10 10*3/uL — ABNORMAL HIGH (ref 1.7–7.7)
Neutrophils Relative %: 69 %
Platelets: 339 10*3/uL (ref 150–400)
RBC: 2.43 MIL/uL — ABNORMAL LOW (ref 3.87–5.11)
RDW: 13.3 % (ref 11.5–15.5)
WBC: 14.2 10*3/uL — ABNORMAL HIGH (ref 4.0–10.5)
nRBC: 0.1 % (ref 0.0–0.2)

## 2020-07-02 LAB — GLUCOSE, CAPILLARY
Glucose-Capillary: 174 mg/dL — ABNORMAL HIGH (ref 70–99)
Glucose-Capillary: 181 mg/dL — ABNORMAL HIGH (ref 70–99)
Glucose-Capillary: 261 mg/dL — ABNORMAL HIGH (ref 70–99)
Glucose-Capillary: 217 mg/dL — ABNORMAL HIGH (ref 70–99)
Glucose-Capillary: 192 mg/dL — ABNORMAL HIGH (ref 70–99)

## 2020-07-02 LAB — SODIUM: Sodium: 126 mmol/L — ABNORMAL LOW (ref 135–145)

## 2020-07-02 LAB — BASIC METABOLIC PANEL
Anion gap: 7 (ref 5–15)
BUN: 18 mg/dL (ref 8–23)
CO2: 26 mmol/L (ref 22–32)
Calcium: 8.4 mg/dL — ABNORMAL LOW (ref 8.9–10.3)
Chloride: 95 mmol/L — ABNORMAL LOW (ref 98–111)
Creatinine, Ser: 1.06 mg/dL — ABNORMAL HIGH (ref 0.44–1.00)
GFR calc Af Amer: 54 mL/min — ABNORMAL LOW (ref >60)
GFR calc non Af Amer: 47 mL/min — ABNORMAL LOW (ref >60)
Glucose, Bld: 172 mg/dL — ABNORMAL HIGH (ref 70–99)
Potassium: 4.4 mmol/L (ref 3.5–5.1)
Sodium: 128 mmol/L — ABNORMAL LOW (ref 135–145)

## 2020-07-02 LAB — SARS CORONAVIRUS 2 (TAT 6-24 HRS): SARS Coronavirus 2: NEGATIVE

## 2020-07-02 MED ORDER — POLYETHYLENE GLYCOL 3350 17 G PO PACK
17.0000 g | PACK | Freq: Two times a day (BID) | ORAL | Status: DC
  Administered 2020-07-02 – 2020-07-06 (×9): 17 g via ORAL
  Filled 2020-07-02 (×9): qty 1

## 2020-07-02 MED ORDER — SODIUM CHLORIDE 0.9 % IV SOLN: INTRAVENOUS | Status: DC

## 2020-07-02 NOTE — Progress Notes (Addendum)
PROGRESS NOTE    Vanessa Gibson  MGQ:[id number] DOB: September 15, 1928 DOA: 06/25/2020 PCP: Tammi Sou, MD   Brief Narrative: Vanessa Gibson is a 86 y.o. female with medical history significant of DM2, HTN, CKD 3a. She is from an IL[facility]. Patient presented secondary to a fall and found to have a right hip fracture.   Assessment & Plan:   Principal Problem:   Closed displaced fracture of right femoral neck (HCC) Active Problems:   Hypertension associated with diabetes ([place]), on Lisinopril   CKD stage 3 secondary to diabetes ([place]), on Lisinopril   Uncontrolled type 2 diabetes mellitus with hyperglycemia, with long-term current use of insulin (HCC)   Right hip fracture Secondary to fall. S/p ORIF per orthopedic surgery. PT recommending SNF on discharge. Orthopedic surgery recommending WBAT, Lovenox for DVT prophylaxis, follow-up 8-14 days post-op.  Essential hypertension Patient is on amlodipine, metoprolol as an outpatient. -Continue amlodipine metoprolol  Hyponatremia Unsure etiology. Given IV fluids without improvement. Appears euvolemic. -Continue IV fluids -Urine sodium/creatinine -AM cortisol  Leukocytosis Has remained stable. CT neck was significant for ground glass attenuation possibly secondary to atelectasis vs edema. Possibly related to stress from fall/surgery  Neck laceration CT neck without significant issues. Stable.  Acute blood loss anemia Secondary to surgery. Hemoglobin of 11.7 on admission with baseline of 11-12. Patient is down to 7.5 post-op and has remained stable. Some ecchymosis on right thigh. -Watch for evidence of continued bleeding.  CAD Hyperlipidemia History of MI. On aspirin and also on lovastatin. -Continue Aspirin -Pravastatin while inpatient  Diabetes mellitus, type 2 Diabetic neuropathy On Tresiba as an outpatient. -Continue Lantus while inpatient and SSI -Continue Lyrica  CKD stage IIIa Stable.  Depression -Continue  Celexa  GERD Patient is on Prilosec as an outpatient. On Prontonix while inpatient  Chronic vertigo Stable.  Possible pancreatitis Ruled out  History of adrenal adenoma Noted in 2016. Outpatient follow-up   DVT prophylaxis: Lovenox Code Status:   Code Status: Full Code Family Communication: None at bedside Disposition Plan: Discharge to SNF likely in 1-2 days; sodium needs to be corrected   Consultants:   Orthopedic surgery  Procedures:   INTERMEDULLARY FEMORAL AFFIXUS NAIL  Antimicrobials:  None    Subjective: No issues overnight.  Objective: Vitals:   07/01/20 0834 07/01/20 1422 07/01/20 1948 07/02/20 0232  BP: (!) 145/58 122/62 140/61 131/64  Pulse: 87 92 86 85  Resp: 16 17 17 16   Temp: 99.2 F (37.3 C) 98.3 F (36.8 C) 99.3 F (37.4 C) 99.1 F (37.3 C)  TempSrc: Oral Oral Oral Oral  SpO2: 94% 96% 96% 98%  Weight:      Height:        Intake/Output Summary (Last 24 hours) at 07/02/2020 0742 Last data filed at 07/01/2020 1900 Gross per 24 hour  Intake 1050 ml  Output --  Net 1050 ml   Filed Weights   06/25/20 2012  Weight: 60.3 kg    Examination:  General exam: Appears calm and comfortable Respiratory system: Clear to auscultation. Respiratory effort normal. Cardiovascular system: S1 & S2 heard, RRR. No murmurs, rubs, gallops or clicks. Gastrointestinal system: Abdomen is distended, soft and nontender. No organomegaly or masses felt. Normal bowel sounds heard. Central nervous system: Alert and oriented. No focal neurological deficits. Musculoskeletal: No edema. No calf tenderness Skin: No cyanosis. Ecchymosis of lateral aspect of right thigh Psychiatry: Judgement and insight appear normal. Mood & affect appropriate.     Data Reviewed: I have personally reviewed following labs  and imaging studies  CBC Lab Results  Component Value Date   WBC 14.2 (H) 07/02/2020   RBC 2.43 (L) 07/02/2020   HGB 7.6 (L) 07/02/2020   HCT 23.1 (L)  07/02/2020   MCV 95.1 07/02/2020   MCH 31.3 07/02/2020   PLT 339 07/02/2020   MCHC 32.9 07/02/2020   RDW 13.3 07/02/2020   LYMPHSABS 1.7 07/02/2020   MONOABS 1.4 (H) 07/02/2020   EOSABS 0.8 (H) 07/02/2020   BASOSABS 0.1 82/50/5397     Last metabolic panel Lab Results  Component Value Date   NA 128 (L) 07/02/2020   K 4.4 07/02/2020   CL 95 (L) 07/02/2020   CO2 26 07/02/2020   BUN 18 07/02/2020   CREATININE 1.06 (H) 07/02/2020   GLUCOSE 172 (H) 07/02/2020   GFRNONAA 47 (L) 07/02/2020   GFRAA 54 (L) 07/02/2020   CALCIUM 8.4 (L) 07/02/2020   PROT 6.2 (L) 06/25/2020   ALBUMIN 3.5 06/25/2020   BILITOT 0.6 06/25/2020   ALKPHOS 57 06/25/2020   AST 29 06/25/2020   ALT 19 06/25/2020   ANIONGAP 7 07/02/2020    CBG (last 3)  Recent Labs    07/01/20 2119 07/02/20 0650 07/02/20 0727  GLUCAP 149* 174* 181*     GFR: Estimated Creatinine Clearance: 29.2 mL/min (A) (by C-G formula based on SCr of 1.06 mg/dL (H)).  Coagulation Profile: Recent Labs  Lab 06/25/20 2036  INR 1.1    Recent Results (from the past 240 hour(s))  SARS Coronavirus 2 by RT PCR (hospital order, performed in [facility] hospital lab) Nasopharyngeal Nasopharyngeal Swab     Status: None   Collection Time: 06/25/20  9:48 PM   Specimen: Nasopharyngeal Swab  Result Value Ref Range Status   SARS Coronavirus 2 NEGATIVE NEGATIVE Final    Comment: (NOTE) SARS-CoV-2 target nucleic acids are NOT DETECTED.  The SARS-CoV-2 RNA is generally detectable in upper and lower respiratory specimens during the acute phase of infection. The lowest concentration of SARS-CoV-2 viral copies this assay can detect is 250 copies / mL. A negative result does not preclude SARS-CoV-2 infection and should not be used as the sole basis for treatment or other patient management decisions.  A negative result may occur with improper specimen collection / handling, submission of specimen other than nasopharyngeal swab, presence of  viral mutation(s) within the areas targeted by this assay, and inadequate number of viral copies (<250 copies / mL). A negative result must be combined with clinical observations, patient history, and epidemiological information.  Fact Sheet for Patients:   StrictlyIdeas.no  Fact Sheet for Healthcare Providers: BankingDealers.co.za  This test is not yet approved or  cleared by the [place] FDA and has been authorized for detection and/or diagnosis of SARS-CoV-2 by FDA under an Emergency Use Authorization (EUA).  This EUA will remain in effect (meaning this test can be used) for the duration of the COVID-19 declaration under Section 564(b)(1) of the Act, 21 U.S.C. section 360bbb-3(b)(1), unless the authorization is terminated or revoked sooner.  Performed at [facility], [place] [address] [place], [place] [address]   Surgical pcr screen     Status: None   Collection Time: 06/25/20 11:41 PM   Specimen: Nasal Mucosa; Nasal Swab  Result Value Ref Range Status   MRSA, PCR NEGATIVE NEGATIVE Final   Staphylococcus aureus NEGATIVE NEGATIVE Final    Comment: (NOTE) The Xpert SA Assay (FDA approved for NASAL specimens in patients 20 years of age and older), is  one component of a comprehensive surveillance program. It is not intended to diagnose infection nor to guide or monitor treatment. Performed at [facility], [place] [address] [place], [place] [address]         Radiology Studies: No results found.      Scheduled Meds: . amLODipine  5 mg Oral Daily  . aspirin EC  81 mg Oral Daily  . bisacodyl  10 mg Rectal Once  . citalopram  20 mg Oral Daily  . docusate sodium  100 mg Oral BID  . enoxaparin (LOVENOX) injection  30 mg Subcutaneous Q24H  . feeding supplement (GLUCERNA SHAKE)  237 mL Oral BID BM  . fluticasone  2 spray Each Nare Daily  . insulin aspart  0-5 Units Subcutaneous QHS  . insulin aspart  0-9  Units Subcutaneous TID WC  . insulin glargine  15 Units Subcutaneous Daily  . loratadine  5 mg Oral Daily  . LORazepam  0.5 mg Oral QHS  . metoprolol tartrate  12.5 mg Oral BID  . montelukast  10 mg Oral QHS  . multivitamin with minerals  1 tablet Oral Daily  . mupirocin ointment  1 application Topical BID  . olopatadine  1 drop Both Eyes BID  . oxybutynin  10 mg Oral QHS  . pantoprazole  40 mg Oral Daily  . pravastatin  20 mg Oral q1800  . pregabalin  25 mg Oral QPC supper  . traMADol  50 mg Oral QHS   Continuous Infusions: . sodium chloride 75 mL/hr at 06/27/20 1652  . sodium chloride       LOS: 7 days     Cordelia Poche, MD [facility] 07/02/2020, 7:42 AM  If 7PM-7AM, please contact night-coverage [email or web address]

## 2020-07-02 NOTE — Progress Notes (Signed)
Physical Therapy Treatment Patient Details Name: Vanessa Gibson MRN: [id number] DOB: Sep 29, 1928 Today's Date: 07/02/2020    History of Present Illness Pt is an 86 y.o. female admitted 06/25/20 after fall sustaining R femoral neck fx, L neck laceration. S/p R hip IMN 8/3. PMH includes DM2, HTN, CKD 3, breast CX, OA, MI, falls vertigo-related(?).    PT Comments    Pt supine in bed on arrival.  Pt tolerated session well with mod cues for encouragement.  Pt continues to require min to mod assistance.  Plan for SNF remains appropriate to improve strength and function before returning home.     Follow Up Recommendations  Supervision/Assistance - 24 hour;SNF     Equipment Recommendations   (defer to post acute)    Recommendations for Other Services       Precautions / Restrictions Precautions Precautions: Fall Restrictions Weight Bearing Restrictions: Yes RLE Weight Bearing: Weight bearing as tolerated    Mobility  Bed Mobility Overal bed mobility: Needs Assistance Bed Mobility: Supine to Sit     Supine to sit: Mod assist Sit to supine: Mod assist   General bed mobility comments: minA to RLE to move to EOB, but pt reaching for HHA to pull to sitting rather than use of bed rail. pt then able to scoot to square at EOB without assist.  Transfers Overall transfer level: Needs assistance Equipment used: Rolling walker (2 wheeled) Transfers: Sit to/from Stand Sit to Stand: Min assist         General transfer comment: Min assistance to boost into standing. Cues for hand placement to and from seated surface.  Ambulation/Gait Ambulation/Gait assistance: Min guard;Min assist Gait Distance (Feet): 40 Feet (+ 10 ft from commode to recliner chair.) Assistive device: Rolling walker (2 wheeled) Gait Pattern/deviations: Step-to pattern;Antalgic;Decreased weight shift to right;Trunk flexed;Leaning posteriorly Gait velocity: Decreased   General Gait Details: Pt required increased  assistance to minA as she fatigued.  Cues for posture.   Stairs             Wheelchair Mobility    Modified Rankin (Stroke Patients Only)       Balance     Sitting balance-Leahy Scale: Poor       Standing balance-Leahy Scale: Poor                              Cognition Arousal/Alertness: Awake/alert Behavior During Therapy: WFL for tasks assessed/performed Overall Cognitive Status: Within Functional Limits for tasks assessed                                 General Comments: WFL for simple tasks, not formally assessed, pt with significant HOH      Exercises General Exercises - Lower Extremity Ankle Circles/Pumps: AROM;Both;10 reps;Supine Quad Sets: AROM;Right;10 reps;Supine Heel Slides: AAROM;Right;10 reps;Supine Hip ABduction/ADduction: Right;10 reps;Supine;AAROM    General Comments        Pertinent Vitals/Pain Pain Assessment: Faces Faces Pain Scale: Hurts little more Pain Location: R hip Pain Descriptors / Indicators: Grimacing;Guarding;Sore Pain Intervention(s): Monitored during session;Ice applied;Repositioned    Home Living                      Prior Function            PT Goals (current goals can now be found in the care plan section) Acute Rehab PT Goals Patient  Stated Goal: Improve independence Potential to Achieve Goals: Good Progress towards PT goals: Progressing toward goals    Frequency    Min 3X/week (decreased to 3x week now that snf is confirmed.)      PT Plan Discharge plan needs to be updated    Co-evaluation              AM-PAC PT "6 Clicks" Mobility   Outcome Measure  Help needed turning from your back to your side while in a flat bed without using bedrails?: A Little Help needed moving from lying on your back to sitting on the side of a flat bed without using bedrails?: A Lot Help needed moving to and from a bed to a chair (including a wheelchair)?: A Lot Help needed  standing up from a chair using your arms (e.g., wheelchair or bedside chair)?: A Little Help needed to walk in hospital room?: A Little Help needed climbing 3-5 steps with a railing? : A Little 6 Click Score: 16    End of Session Equipment Utilized During Treatment: Gait belt   Patient left: in chair;with call bell/phone within reach;with chair alarm set Nurse Communication: Mobility status PT Visit Diagnosis: Unsteadiness on feet (R26.81);Other abnormalities of gait and mobility (R26.89);Pain Pain - Right/Left: Right Pain - part of body: Hip     Time: 0277-4128 PT Time Calculation (min) (ACUTE ONLY): 32 min  Charges:  $Gait Training: 8-22 mins $Therapeutic Exercise: 8-22 mins                     Erasmo Leventhal , PTA Acute Rehabilitation Services Pager [phone] Office [phone]     Sarann Tregre Eli Hose 07/02/2020, 2:24 PM

## 2020-07-02 NOTE — TOC Progression Note (Signed)
Transition of Care [facility]) - Progression Note    Patient Details  Name: Vanessa Gibson MRN: [id number] Date of Birth: 12/07/1927  Transition of Care [facility]) CM/SW [place], RN Phone Number: 07/02/2020, 10:00 AM  Clinical Narrative:    Case management talked with [facility] this morning and they received insurance back on the patient on Saturday, 06/30/2020.  I reached out to the Dr. Lonny Prude this morning and the patient's WBC count is elevated and may be tomorrow before the patient can transfer to a facility.  I called the patient's son and notified him of this since he lives out of town.  COVID screen results are pending results from this morning.  Will continue to follow for discharge possibly tomorrow.   Expected Discharge Plan: [place] (Inpatient Rehab versus home with home health if patient is able to establish 24 hour supervision with family and/or [facility]) Barriers to Discharge: Continued Medical Work up  Expected Discharge Plan and Services Expected Discharge Plan: [place] (Inpatient Rehab versus home with home health if patient is able to establish 24 hour supervision with family and/or [facility])   Discharge Planning Services: CM Consult Post Acute Care Choice: IP Rehab, Home Health ([place] versus IP Rehab) Living arrangements for the past 2 months: [place] ([place])                       Social Determinants of Health (SDOH) Interventions    Readmission Risk Interventions Readmission Risk Prevention Plan 06/27/2020  Transportation Screening Complete  PCP or Specialist Appt within 5-7 Days Complete  Home Care Screening Complete  Medication Review (RN CM) Complete  Some recent data might be hidden

## 2020-07-03 LAB — BASIC METABOLIC PANEL
Anion gap: 11 (ref 5–15)
BUN: 14 mg/dL (ref 8–23)
CO2: 20 mmol/L — ABNORMAL LOW (ref 22–32)
Calcium: 8.3 mg/dL — ABNORMAL LOW (ref 8.9–10.3)
Chloride: 96 mmol/L — ABNORMAL LOW (ref 98–111)
Creatinine, Ser: 0.91 mg/dL (ref 0.44–1.00)
GFR calc Af Amer: >60 mL/min (ref >60)
GFR calc non Af Amer: 56 mL/min — ABNORMAL LOW (ref >60)
Glucose, Bld: 188 mg/dL — ABNORMAL HIGH (ref 70–99)
Potassium: 4.1 mmol/L (ref 3.5–5.1)
Sodium: 127 mmol/L — ABNORMAL LOW (ref 135–145)

## 2020-07-03 LAB — SODIUM: Sodium: 128 mmol/L — ABNORMAL LOW (ref 135–145)

## 2020-07-03 LAB — CORTISOL-AM, BLOOD: Cortisol - AM: 13.4 ug/dL (ref 6.7–22.6)

## 2020-07-03 LAB — SODIUM, URINE, RANDOM: Sodium, Ur: 100 mmol/L

## 2020-07-03 LAB — CBC
HCT: 23.1 % — ABNORMAL LOW (ref 36.0–46.0)
Hemoglobin: 7.6 g/dL — ABNORMAL LOW (ref 12.0–15.0)
MCH: 31.5 pg (ref 26.0–34.0)
MCHC: 32.9 g/dL (ref 30.0–36.0)
MCV: 95.9 fL (ref 80.0–100.0)
Platelets: 362 10*3/uL (ref 150–400)
RBC: 2.41 MIL/uL — ABNORMAL LOW (ref 3.87–5.11)
RDW: 13.5 % (ref 11.5–15.5)
WBC: 14.1 10*3/uL — ABNORMAL HIGH (ref 4.0–10.5)
nRBC: 0 % (ref 0.0–0.2)

## 2020-07-03 LAB — GLUCOSE, CAPILLARY
Glucose-Capillary: 178 mg/dL — ABNORMAL HIGH (ref 70–99)
Glucose-Capillary: 188 mg/dL — ABNORMAL HIGH (ref 70–99)
Glucose-Capillary: 151 mg/dL — ABNORMAL HIGH (ref 70–99)
Glucose-Capillary: 145 mg/dL — ABNORMAL HIGH (ref 70–99)

## 2020-07-03 LAB — OSMOLALITY, URINE: Osmolality, Ur: 406 mOsm/kg (ref 300–900)

## 2020-07-03 NOTE — Progress Notes (Signed)
Physical Therapy Treatment Patient Details Name: Vanessa Gibson MRN: [id number] DOB: Mar 02, 1928 Today's Date: 07/03/2020    History of Present Illness Pt is an 86 y.o. female admitted 06/25/20 after fall sustaining R femoral neck fx, L neck laceration. S/p R hip IMN 8/3. PMH includes DM2, HTN, CKD 3, breast CX, OA, MI, falls vertigo-related(?).    PT Comments    Pt supine in bed on arrival this session.  Pt required min assistance throughout.  No hallucinations this pm.  Continue to recommend snf placement at d/c.     Follow Up Recommendations  Supervision/Assistance - 24 hour;SNF     Equipment Recommendations   (defer to post acute)    Recommendations for Other Services       Precautions / Restrictions Precautions Precautions: Fall Restrictions Weight Bearing Restrictions: Yes RLE Weight Bearing: Weight bearing as tolerated    Mobility  Bed Mobility Overal bed mobility: Needs Assistance Bed Mobility: Supine to Sit     Supine to sit: Min assist Sit to supine: Min assist   General bed mobility comments: Min assistance to rise into sitting.  Min assistance to return back to bed.  Transfers Overall transfer level: Needs assistance Equipment used: Rolling walker (2 wheeled) Transfers: Sit to/from Stand Sit to Stand: Min assist         General transfer comment: Cues for hand placement to and from seated surface.  Ambulation/Gait Ambulation/Gait assistance: Min guard;Min assist Gait Distance (Feet): 40 Feet Assistive device: Rolling walker (2 wheeled) Gait Pattern/deviations: Step-to pattern;Antalgic;Decreased weight shift to right;Trunk flexed;Leaning posteriorly     General Gait Details: Cues for posture and RW safety.   Stairs             Wheelchair Mobility    Modified Rankin (Stroke Patients Only)       Balance Overall balance assessment: Needs assistance Sitting-balance support: No upper extremity supported;Feet unsupported Sitting  balance-Leahy Scale: Poor     Standing balance support: During functional activity;Bilateral upper extremity supported Standing balance-Leahy Scale: Poor Standing balance comment: Reliant on UE support on walker                            Cognition Arousal/Alertness: Lethargic Behavior During Therapy: Impulsive;Flat affect Overall Cognitive Status: Impaired/Different from baseline Area of Impairment: Orientation;Memory;Following commands;Safety/judgement;Awareness;Problem solving                 Orientation Level: Disoriented to;Time   Memory: Decreased short-term memory Following Commands: Follows one step commands inconsistently;Follows multi-step commands inconsistently Safety/Judgement: Decreased awareness of deficits;Decreased awareness of safety Awareness: Emergent Problem Solving: Requires tactile cues;Requires verbal cues General Comments: No hallucinations this pm      Exercises      General Comments        Pertinent Vitals/Pain Pain Assessment: Faces Faces Pain Scale: Hurts even more Pain Location: R hip Pain Descriptors / Indicators: Grimacing;Guarding;Sore Pain Intervention(s): Monitored during session;Repositioned    Home Living                      Prior Function            PT Goals (current goals can now be found in the care plan section) Acute Rehab PT Goals Patient Stated Goal: Improve independence Potential to Achieve Goals: Good Progress towards PT goals: Progressing toward goals    Frequency    Min 3X/week      PT Plan Current plan remains appropriate  Co-evaluation              AM-PAC PT "6 Clicks" Mobility   Outcome Measure  Help needed turning from your back to your side while in a flat bed without using bedrails?: A Little Help needed moving from lying on your back to sitting on the side of a flat bed without using bedrails?: A Lot Help needed moving to and from a bed to a chair (including a  wheelchair)?: A Lot Help needed standing up from a chair using your arms (e.g., wheelchair or bedside chair)?: A Little Help needed to walk in hospital room?: A Little Help needed climbing 3-5 steps with a railing? : A Little 6 Click Score: 16    End of Session Equipment Utilized During Treatment: Gait belt Activity Tolerance: Patient tolerated treatment well;Patient limited by pain Patient left: in chair;with call bell/phone within reach;with chair alarm set Nurse Communication: Mobility status PT Visit Diagnosis: Unsteadiness on feet (R26.81);Other abnormalities of gait and mobility (R26.89);Pain Pain - Right/Left: Right Pain - part of body: Hip     Time: 0786-7544 PT Time Calculation (min) (ACUTE ONLY): 21 min  Charges:  $Gait Training: 8-22 mins                     Erasmo Leventhal , PTA Acute Rehabilitation Services Pager [phone] Office [phone]     Glorimar Stroope Eli Hose 07/03/2020, 5:47 PM

## 2020-07-03 NOTE — Progress Notes (Signed)
Occupational Therapy Treatment Patient Details Name: Vanessa Gibson MRN: [id number] DOB: 1928/09/17 Today's Date: 07/03/2020    History of present illness Pt is an 86 y.o. female admitted 06/25/20 after fall sustaining R femoral neck fx, L neck laceration. S/p R hip IMN 8/3. PMH includes DM2, HTN, CKD 3, breast CX, OA, MI, falls vertigo-related(?).   OT comments  Patient continues to make progress towards goals in skilled OT session. Patient's session encompassed ambulation to bathroom to complete ADLs and increase activity tolerance and functional mobility. Pt noted to have a change in cognition to date, as pt is now seeing people in her room, (a man and a woman), and asking therapist if she could see them too. Pt also minimally disoriented to time and situation in session. Of note when ambulating back from the bathroom, pt with increased impulsivity, and with three LOB as patient would reach outside base of support and take one and off the walker as she felt as if she was falling. Pt required multi-modal cues to progress with ambulation and walker management to safely ambulate back to bed. Discharge remains appropriate at this time; will continue to follow acutely.    Follow Up Recommendations  SNF    Equipment Recommendations  Other (comment) (Defer to next venue)    Recommendations for Other Services      Precautions / Restrictions Precautions Precautions: Fall Restrictions Weight Bearing Restrictions: Yes RLE Weight Bearing: Weight bearing as tolerated       Mobility Bed Mobility Overal bed mobility: Needs Assistance Bed Mobility: Sit to Supine       Sit to supine: Mod assist   General bed mobility comments: Pt flopped back into bed, despite cues for safety stating "I was close enough" and requring increased assist to get BLEs into bed  Transfers Overall transfer level: Needs assistance Equipment used: Rolling walker (2 wheeled) Transfers: Sit to/from Stand Sit to Stand:  Min assist;Mod assist         General transfer comment: Min A to min gaurd for standing and ambulating to commode, however noted to have 3 LOB when ambulating back, as pt kept taking hands off of walker and reaching outside base of support requring HOH A and cues to reorient to task    Balance Overall balance assessment: Needs assistance Sitting-balance support: No upper extremity supported;Feet unsupported Sitting balance-Leahy Scale: Poor     Standing balance support: During functional activity;Bilateral upper extremity supported Standing balance-Leahy Scale: Poor Standing balance comment: Reliant on UE support on walker                           ADL either performed or assessed with clinical judgement   ADL Overall ADL's : Needs assistance/impaired                     Lower Body Dressing: Sit to/from stand;Minimal assistance   Toilet Transfer: Regular Toilet;Grab bars;RW;Minimal assistance;Cueing for safety;Cueing for sequencing;Ambulation Toilet Transfer Details (indicate cue type and reason): Min A for safety Toileting- Clothing Manipulation and Hygiene: Minimal assistance;Sit to/from stand;Cueing for sequencing;Cueing for safety       Functional mobility during ADLs: Minimal assistance;Rolling walker;Moderate assistance;Cueing for safety;Cueing for sequencing General ADL Comments: Pt with changes in cognition and 3 LOB when ambulating back to bed requiring increased assistance     Vision       Perception     Praxis      Cognition Arousal/Alertness: Lethargic  Behavior During Therapy: Impulsive;Flat affect Overall Cognitive Status: Impaired/Different from baseline Area of Impairment: Orientation;Memory;Following commands;Safety/judgement;Awareness;Problem solving                 Orientation Level: Disoriented to;Time;Situation   Memory: Decreased short-term memory Following Commands: Follows one step commands inconsistently;Follows  multi-step commands inconsistently Safety/Judgement: Decreased awareness of deficits;Decreased awareness of safety Awareness: Emergent Problem Solving: Requires tactile cues;Requires verbal cues General Comments: Impulsive in session when ambulating back from the bathroom, with three LOB as patient would reach outside base of support and take one and off the walker as she felt as if she was falling. Pt now seeing people in her room, a man and a woman, and asking therapist if she could see them too.        Exercises     Shoulder Instructions       General Comments      Pertinent Vitals/ Pain       Pain Assessment: Faces Faces Pain Scale: Hurts even more Pain Location: R hip Pain Descriptors / Indicators: Grimacing;Guarding;Sore Pain Intervention(s): Limited activity within patient's tolerance;Monitored during session;Repositioned  Home Living                                          Prior Functioning/Environment              Frequency  Min 2X/week        Progress Toward Goals  OT Goals(current goals can now be found in the care plan section)  Progress towards OT goals: Progressing toward goals  Acute Rehab OT Goals Patient Stated Goal: Improve independence OT Goal Formulation: With patient Time For Goal Achievement: 07/11/20 Potential to Achieve Goals: Good  Plan Discharge plan remains appropriate    Co-evaluation                 AM-PAC OT "6 Clicks" Daily Activity     Outcome Measure   Help from another person eating meals?: None Help from another person taking care of personal grooming?: A Little Help from another person toileting, which includes using toliet, bedpan, or urinal?: A Little Help from another person bathing (including washing, rinsing, drying)?: A Little Help from another person to put on and taking off regular upper body clothing?: A Little Help from another person to put on and taking off regular lower body  clothing?: A Lot 6 Click Score: 18    End of Session Equipment Utilized During Treatment: Gait belt;Rolling walker  OT Visit Diagnosis: Other abnormalities of gait and mobility (R26.89);Unsteadiness on feet (R26.81);Muscle weakness (generalized) (M62.81);Pain Pain - Right/Left: Right Pain - part of body: Hip   Activity Tolerance Patient limited by fatigue;Patient limited by pain;Patient limited by lethargy   Patient Left in bed;with call bell/phone within reach;with bed alarm set   Nurse Communication Mobility status;Other (comment) (LOB, seeing people in the room, urine sample in commode)        Time: 6503-5465 OT Time Calculation (min): 19 min  Charges: OT General Charges $OT Visit: 1 Visit OT Treatments $Self Care/Home Management : 8-22 mins  Corinne Ports E. Hayle Parisi, COTA/L Acute Rehabilitation Services [place] 07/03/2020, 3:45 PM

## 2020-07-03 NOTE — Progress Notes (Addendum)
PROGRESS NOTE    Callee Rohrig  DJT:[id number] DOB: May 17, 1928 DOA: 06/25/2020 PCP: Tammi Sou, MD   Brief Narrative: Vanessa Gibson is a 86 y.o. female with medical history significant of DM2, HTN, CKD 3a. She is from an ILF. Patient presented secondary to a fall and found to have a right hip fracture.   Assessment & Plan:   Principal Problem:   Closed displaced fracture of right femoral neck (HCC) Active Problems:   Hypertension associated with diabetes ([place]), on Lisinopril   CKD stage 3 secondary to diabetes ([place]), on Lisinopril   Uncontrolled type 2 diabetes mellitus with hyperglycemia, with long-term current use of insulin (HCC)   Right hip fracture Secondary to fall. S/p ORIF per orthopedic surgery. PT recommending SNF on discharge. Orthopedic surgery recommending WBAT, Lovenox for DVT prophylaxis, follow-up 8-14 days post-op.  Essential hypertension Patient is on amlodipine, metoprolol as an outpatient. -Continue amlodipine metoprolol  Hyponatremia Unsure etiology. Given IV fluids without improvement. Appears euvolemic. UOP of 800 mL with unmeasured occurrences. -Continue IV fluids -Urine sodium/creatinine  Leukocytosis Has remained stable. CT neck was significant for ground glass attenuation possibly secondary to atelectasis vs edema. Possibly related to stress from fall/surgery  Neck laceration CT neck without significant issues. Left neck. Stable. Suture removal completed on 8/10. Five stitches removed. Scab located on superior aspect of laceration but does not appear to contain a suture. Overall the wound is without erythema.  Acute blood loss anemia Secondary to surgery. Hemoglobin of 11.7 on admission with baseline of 11-12. Patient is down to 7.5 post-op and has remained stable. Some ecchymosis on right thigh. -Watch for evidence of continued bleeding.  CAD Hyperlipidemia History of MI. On aspirin and also on lovastatin. -Continue Aspirin -Pravastatin  while inpatient  Confusion Per son, this is a chronic issue over years. -Recommend outpatient neurology follow-up vs psychiatry follow-up  Diabetes mellitus, type 2 Diabetic neuropathy On Tresiba as an outpatient. -Continue Lantus while inpatient and SSI -Continue Lyrica  CKD stage IIIa Stable.  Depression -Continue Celexa  GERD Patient is on Prilosec as an outpatient. On Prontonix while inpatient  Chronic vertigo Stable.  Possible pancreatitis Ruled out  History of adrenal adenoma Noted in 2016. Outpatient follow-up   DVT prophylaxis: Lovenox Code Status:   Code Status: Full Code Family Communication: Son on telephone, 10 minutes Disposition Plan: Discharge to SNF likely in 1-2 days; sodium needs to be corrected   Consultants:   Orthopedic surgery  Procedures:   INTERMEDULLARY FEMORAL AFFIXUS NAIL  Antimicrobials:  None    Subjective: No issues overnight.  Objective: Vitals:   07/02/20 1759 07/02/20 1939 07/03/20 0405 07/03/20 0806  BP: 120/62 (!) 155/64 (!) 144/64 (!) 147/62  Pulse: 79 86 86 86  Resp: (!) 22 17 16 20   Temp: 99.1 F (37.3 C) 98.1 F (36.7 C) 98.1 F (36.7 C) 98.9 F (37.2 C)  TempSrc: Oral Oral Oral Oral  SpO2: 96% 96% 96% 95%  Weight:      Height:        Intake/Output Summary (Last 24 hours) at 07/03/2020 0915 Last data filed at 07/03/2020 0500 Gross per 24 hour  Intake 924.96 ml  Output 800 ml  Net 124.96 ml   Filed Weights   06/25/20 2012  Weight: 60.3 kg    Examination:  General exam: Appears calm and comfortable Respiratory system: Clear to auscultation. Respiratory effort normal. Cardiovascular system: S1 & S2 heard, RRR. No murmurs, rubs, gallops or clicks. Gastrointestinal system: Abdomen is nondistended,  soft and nontender. No organomegaly or masses felt. Normal bowel sounds heard. Central nervous system: Alert and oriented. No focal neurological deficits. Musculoskeletal:  No calf tenderness. Edema of  right thigh. Skin: No cyanosis. Ecchymosis of right lateral thigh. Left skin laceration without erythema Psychiatry: Judgement and insight appear normal. Mood & affect appropriate.     Data Reviewed: I have personally reviewed following labs and imaging studies  CBC Lab Results  Component Value Date   WBC 14.2 (H) 07/02/2020   RBC 2.43 (L) 07/02/2020   HGB 7.6 (L) 07/02/2020   HCT 23.1 (L) 07/02/2020   MCV 95.1 07/02/2020   MCH 31.3 07/02/2020   PLT 339 07/02/2020   MCHC 32.9 07/02/2020   RDW 13.3 07/02/2020   LYMPHSABS 1.7 07/02/2020   MONOABS 1.4 (H) 07/02/2020   EOSABS 0.8 (H) 07/02/2020   BASOSABS 0.1 97/35/3299     Last metabolic panel Lab Results  Component Value Date   NA 126 (L) 07/02/2020   K 4.4 07/02/2020   CL 95 (L) 07/02/2020   CO2 26 07/02/2020   BUN 18 07/02/2020   CREATININE 1.06 (H) 07/02/2020   GLUCOSE 172 (H) 07/02/2020   GFRNONAA 47 (L) 07/02/2020   GFRAA 54 (L) 07/02/2020   CALCIUM 8.4 (L) 07/02/2020   PROT 6.2 (L) 06/25/2020   ALBUMIN 3.5 06/25/2020   BILITOT 0.6 06/25/2020   ALKPHOS 57 06/25/2020   AST 29 06/25/2020   ALT 19 06/25/2020   ANIONGAP 7 07/02/2020    CBG (last 3)  Recent Labs    07/02/20 1642 07/02/20 2105 07/03/20 0659  GLUCAP 217* 192* 178*     GFR: Estimated Creatinine Clearance: 29.2 mL/min (A) (by C-G formula based on SCr of 1.06 mg/dL (H)).  Coagulation Profile: No results for input(s): INR, PROTIME in the last 168 hours.  Recent Results (from the past 240 hour(s))  SARS Coronavirus 2 by RT PCR (hospital order, performed in [facility] hospital lab) Nasopharyngeal Nasopharyngeal Swab     Status: None   Collection Time: 06/25/20  9:48 PM   Specimen: Nasopharyngeal Swab  Result Value Ref Range Status   SARS Coronavirus 2 NEGATIVE NEGATIVE Final    Comment: (NOTE) SARS-CoV-2 target nucleic acids are NOT DETECTED.  The SARS-CoV-2 RNA is generally detectable in upper and lower respiratory specimens during  the acute phase of infection. The lowest concentration of SARS-CoV-2 viral copies this assay can detect is 250 copies / mL. A negative result does not preclude SARS-CoV-2 infection and should not be used as the sole basis for treatment or other patient management decisions.  A negative result may occur with improper specimen collection / handling, submission of specimen other than nasopharyngeal swab, presence of viral mutation(s) within the areas targeted by this assay, and inadequate number of viral copies (<250 copies / mL). A negative result must be combined with clinical observations, patient history, and epidemiological information.  Fact Sheet for Patients:   StrictlyIdeas.no  Fact Sheet for Healthcare Providers: BankingDealers.co.za  This test is not yet approved or  cleared by the [place] FDA and has been authorized for detection and/or diagnosis of SARS-CoV-2 by FDA under an Emergency Use Authorization (EUA).  This EUA will remain in effect (meaning this test can be used) for the duration of the COVID-19 declaration under Section 564(b)(1) of the Act, 21 U.S.C. section 360bbb-3(b)(1), unless the authorization is terminated or revoked sooner.  Performed at [facility], [place] [address] [place], [place] [address]  Surgical pcr screen     Status: None   Collection Time: 06/25/20 11:41 PM   Specimen: Nasal Mucosa; Nasal Swab  Result Value Ref Range Status   MRSA, PCR NEGATIVE NEGATIVE Final   Staphylococcus aureus NEGATIVE NEGATIVE Final    Comment: (NOTE) The Xpert SA Assay (FDA approved for NASAL specimens in patients 15 years of age and older), is one component of a comprehensive surveillance program. It is not intended to diagnose infection nor to guide or monitor treatment. Performed at [facility], [place] [address], [place], [place] [address]   SARS CORONAVIRUS 2 (TAT 6-24 HRS) Nasopharyngeal  Nasopharyngeal Swab     Status: None   Collection Time: 07/02/20  5:36 AM   Specimen: Nasopharyngeal Swab  Result Value Ref Range Status   SARS Coronavirus 2 NEGATIVE NEGATIVE Final    Comment: (NOTE) SARS-CoV-2 target nucleic acids are NOT DETECTED.  The SARS-CoV-2 RNA is generally detectable in upper and lower respiratory specimens during the acute phase of infection. Negative results do not preclude SARS-CoV-2 infection, do not rule out co-infections with other pathogens, and should not be used as the sole basis for treatment or other patient management decisions. Negative results must be combined with clinical observations, patient history, and epidemiological information. The expected result is Negative.  Fact Sheet for Patients: SugarRoll.be  Fact Sheet for Healthcare Providers: https://www.woods-mathews.com/  This test is not yet approved or cleared by the [place] FDA and  has been authorized for detection and/or diagnosis of SARS-CoV-2 by FDA under an Emergency Use Authorization (EUA). This EUA will remain  in effect (meaning this test can be used) for the duration of the COVID-19 declaration under Se ction 564(b)(1) of the Act, 21 U.S.C. section 360bbb-3(b)(1), unless the authorization is terminated or revoked sooner.  Performed at [facility], [place] [address], [place], [place] [address]         Radiology Studies: No results found.      Scheduled Meds: . amLODipine  5 mg Oral Daily  . aspirin EC  81 mg Oral Daily  . bisacodyl  10 mg Rectal Once  . citalopram  20 mg Oral Daily  . docusate sodium  100 mg Oral BID  . enoxaparin (LOVENOX) injection  30 mg Subcutaneous Q24H  . feeding supplement (GLUCERNA SHAKE)  237 mL Oral BID BM  . fluticasone  2 spray Each Nare Daily  . insulin aspart  0-5 Units Subcutaneous QHS  . insulin aspart  0-9 Units Subcutaneous TID WC  . insulin glargine  15 Units  Subcutaneous Daily  . loratadine  5 mg Oral Daily  . LORazepam  0.5 mg Oral QHS  . metoprolol tartrate  12.5 mg Oral BID  . montelukast  10 mg Oral QHS  . multivitamin with minerals  1 tablet Oral Daily  . mupirocin ointment  1 application Topical BID  . olopatadine  1 drop Both Eyes BID  . oxybutynin  10 mg Oral QHS  . pantoprazole  40 mg Oral Daily  . polyethylene glycol  17 g Oral BID  . pravastatin  20 mg Oral q1800  . pregabalin  25 mg Oral QPC supper  . traMADol  50 mg Oral QHS   Continuous Infusions: . sodium chloride 100 mL/hr at 07/03/20 0400     LOS: 8 days     Cordelia Poche, MD [facility] 07/03/2020, 9:15 AM  If 7PM-7AM, please contact night-coverage [email or web address]

## 2020-07-04 DIAGNOSIS — E871 Hypo-osmolality and hyponatremia: Secondary | ICD-10-CM

## 2020-07-04 LAB — BASIC METABOLIC PANEL
Anion gap: 8 (ref 5–15)
BUN: 11 mg/dL (ref 8–23)
CO2: 24 mmol/L (ref 22–32)
Calcium: 8.4 mg/dL — ABNORMAL LOW (ref 8.9–10.3)
Chloride: 96 mmol/L — ABNORMAL LOW (ref 98–111)
Creatinine, Ser: 0.89 mg/dL (ref 0.44–1.00)
GFR calc Af Amer: >60 mL/min (ref >60)
GFR calc non Af Amer: 57 mL/min — ABNORMAL LOW (ref >60)
Glucose, Bld: 216 mg/dL — ABNORMAL HIGH (ref 70–99)
Potassium: 4.1 mmol/L (ref 3.5–5.1)
Sodium: 128 mmol/L — ABNORMAL LOW (ref 135–145)

## 2020-07-04 LAB — GLUCOSE, CAPILLARY
Glucose-Capillary: 149 mg/dL — ABNORMAL HIGH (ref 70–99)
Glucose-Capillary: 210 mg/dL — ABNORMAL HIGH (ref 70–99)
Glucose-Capillary: 123 mg/dL — ABNORMAL HIGH (ref 70–99)
Glucose-Capillary: 106 mg/dL — ABNORMAL HIGH (ref 70–99)

## 2020-07-04 LAB — CBC
HCT: 22.8 % — ABNORMAL LOW (ref 36.0–46.0)
Hemoglobin: 7.4 g/dL — ABNORMAL LOW (ref 12.0–15.0)
MCH: 30.7 pg (ref 26.0–34.0)
MCHC: 32.5 g/dL (ref 30.0–36.0)
MCV: 94.6 fL (ref 80.0–100.0)
Platelets: 398 10*3/uL (ref 150–400)
RBC: 2.41 MIL/uL — ABNORMAL LOW (ref 3.87–5.11)
RDW: 13.9 % (ref 11.5–15.5)
WBC: 12.6 10*3/uL — ABNORMAL HIGH (ref 4.0–10.5)
nRBC: 0 % (ref 0.0–0.2)

## 2020-07-04 MED ORDER — GLUCERNA SHAKE PO LIQD
237.0000 mL | Freq: Three times a day (TID) | ORAL | Status: DC
  Administered 2020-07-04 – 2020-07-06 (×5): 237 mL via ORAL
  Filled 2020-07-04 (×3): qty 237

## 2020-07-04 MED ORDER — CALCIUM CARBONATE-VITAMIN D 500-200 MG-UNIT PO TABS
1.0000 | ORAL_TABLET | Freq: Every day | ORAL | Status: DC
  Administered 2020-07-05 – 2020-07-06 (×2): 1 via ORAL
  Filled 2020-07-04 (×3): qty 1

## 2020-07-04 NOTE — Plan of Care (Signed)

## 2020-07-04 NOTE — Consult Note (Signed)
   [facility] CM Inpatient Consult   07/04/2020  Luretha Eberly August 16, 1928 [id number]  [place] [facility] [ACO] Patient: [facility] Medicare    Patient screened for high risk score for unplanned readmission score and for hospitalizations length of stay to check if potential [place] Management service needs.  Review of patient's medical record reveals patient is for a skilled nursing facility level verses home with home health level of care for transition.  Primary Care Provider is McGowen, Adrian Blackwater, MD this provider is listed to provide the transition of care [TOC] for post hospital follow up.  Plan:  Continue to follow progress and disposition to assess for post hospital care management needs.    Please place a [facility] Care Management consult as appropriate and for questions contact:   Natividad Brood, RN BSN [facility] Liaison  [phone] business mobile phone Toll free office [phone]  Fax number: [phone] [email or web address]

## 2020-07-04 NOTE — Progress Notes (Signed)
Nutrition Follow-up  DOCUMENTATION CODES:   Not applicable  INTERVENTION:   -Increase Glucerna Shake po to TID, each supplement provides 220 kcal and 10 grams of protein -Continue MVI with minerals daily  NUTRITION DIAGNOSIS:   Increased nutrient needs related to post-op healing as evidenced by estimated needs.  Ongoing  GOAL:   Patient will meet greater than or equal to 90% of their needs  Progressing   MONITOR:   PO intake, Supplement acceptance, Diet advancement, Labs, Skin, I & O's, Weight trends  REASON FOR ASSESSMENT:   Consult Hip fracture protocol  ASSESSMENT:   Vanessa Gibson is a 86 y.o. female who complains of  Pain to the right hip. She lives alone in a retirement community. Yesterday she states that she fell while she was trying to get up off of the couch.  8/3- s/p PROCEDURE: Intramedullary nailing of the right hip using a [employer] Affixus nail11 x 340 statically locked.  Reviewed I/O's: -680 ml x 24 hours and +4.1 L since admission  UOP: 1.5 L x 24 hours  Attempted to speak with pt via phone call to hospital room phone, but unable to reach.   Pt with variable oral intake; noted meal completion 10-75%. Pt is consuming Glucerna supplements per MAR. Pt with no BM since 06/28/20; currently on bowel regimen.   Per MD notes, plan to d/c to SNF once sodium levels are corrected.   Medications reviewed and include colace and miralax.   Labs reviewed: CBGS: 145-151 (inpatient orders for glycemic control are 0-5 units insulin aspart daily at bedtime and 0-9 units insulin glargine daily).   Diet Order:   Diet Order            Diet Carb Modified Fluid consistency: Thin; Room service appropriate? Yes with Assist  Diet effective now                 EDUCATION NEEDS:   No education needs have been identified at this time  Skin:  Skin Assessment: Skin Integrity Issues: Skin Integrity Issues:: Other (Comment), Incisions Incisions: closed rt hip Other: lt  neck laceration  Last BM:  Unknown  Height:   Ht Readings from Last 1 Encounters:  06/25/20 5' (1.524 m)    Weight:   Wt Readings from Last 1 Encounters:  06/25/20 60.3 kg    Ideal Body Weight:  45.5 kg  BMI:  Body mass index is 25.97 kg/m.  Estimated Nutritional Needs:   Kcal:  1600-1800  Protein:  75-90 grams  Fluid:  > 1.6 L    Loistine Chance, RD, LDN, [place] Registered Dietitian II Certified Diabetes Care and Education Specialist Please refer to [facility] for RD and/or RD on-call/weekend/after hours pager

## 2020-07-04 NOTE — Progress Notes (Signed)
Progress Note    Vanessa Gibson  CHE:[id number] DOB: Jul 30, 1928  DOA: 06/25/2020 PCP: Tammi Sou, MD    Brief Narrative:     Medical records reviewed and are as summarized below:  Vanessa Gibson is an 86 y.o. female with medical history significant ofDM2, HTN, CKD 3a. She is from an ILF. Patient presented secondary to a fall and found to have a right hip fracture.  Stay has been complicated by hyponatremia.  Assessment/Plan:   Principal Problem:   Closed displaced fracture of right femoral neck (HCC) Active Problems:   Hypertension associated with diabetes ([place]), on Lisinopril   CKD stage 3 secondary to diabetes ([place]), on Lisinopril   Uncontrolled type 2 diabetes mellitus with hyperglycemia, with long-term current use of insulin (HCC)   Hyponatremia   Right hip fracture Secondary to fall. S/p ORIF per orthopedic surgery. PT recommending SNF on discharge. Orthopedic surgery recommending WBAT, Lovenox for DVT prophylaxis, follow-up 8-14 days post-op. -vit D low- replace per protocol  Essential hypertension Patient is on amlodipine, metoprolol as an outpatient. -Continue amlodipine metoprolol  Hyponatremia - per urine Na yesterday-- consistent with SIADH -d/c IVF -fluid restrict -labs from today STILL not done  Leukocytosis Has remained stable. CT neck was significant for ground glass attenuation possibly secondary to atelectasis vs edema. Possibly related to stress from fall/surgery -incentive spirometry -no sign of active infection  Neck laceration CT neck without significant issues. Left neck. Stable. Suture removal completed on 8/10. Five stitches removed. Scab located on superior aspect of laceration but does not appear to contain a suture. Overall the wound is without erythema.  Acute blood loss anemia Secondary to surgery. Hemoglobin of 11.7 on admission with baseline of 11-12. Patient is down to 7.5 post-op  -stable  CAD Hyperlipidemia History of  MI. On aspirin and also on lovastatin. -Continue Aspirin -Pravastatin while inpatient  Confusion Per son, this is a chronic issue over years. -Recommend outpatient neurology follow-up vs psychiatry follow-up  Diabetes mellitus, type 2 with Diabetic neuropathy On Tresiba as an outpatient. -Continue Lantus while inpatient and SSI -Continue Lyrica  CKD stage IIIa Stable.  Depression -Continue Celexa  GERD Patient is on Prilosec as an outpatient. On Prontonix while inpatient  Chronic vertigo Stable.  Possible pancreatitis Ruled out  History of adrenal adenoma Noted in 2016. Outpatient follow-up   Family Communication/Anticipated D/C date and plan/Code Status   DVT prophylaxis: Lovenox ordered. Code Status: Full Code.  Disposition Plan: Status is: Inpatient  Remains inpatient appropriate because:Unsafe d/c plan   Dispo: The patient is from: ALF              Anticipated d/c is to: SNF              Anticipated d/c date is: 1 day              Patient currently is medically stable to d/c.         Medical Consultants:    ortho  Subjective:   No current complaints except she says the days are long here  Objective:    Vitals:   07/04/20 0316 07/04/20 0755 07/04/20 0935 07/04/20 0936  BP: (!) 156/68 134/70 134/70 134/70  Pulse: 89 91  91  Resp: 15 18    Temp: 98.5 F (36.9 C) 98.6 F (37 C)    TempSrc: Oral Oral    SpO2: 98% 96%    Weight:      Height:  Intake/Output Summary (Last 24 hours) at 07/04/2020 1029 Last data filed at 07/04/2020 0500 Gross per 24 hour  Intake 700 ml  Output 1500 ml  Net -800 ml   Filed Weights   06/25/20 2012  Weight: 60.3 kg    Exam:  General: Appearance:     Overweight female in no acute distress     Lungs:     Clear to auscultation bilaterally, respirations unlabored  Heart:    Normal heart rate. Normal rhythm. No murmurs, rubs, or gallops.   MS:   All extremities are intact.   Neurologic:    Awake, alert.  No apparent focal neurological defect.     Data Reviewed:   I have personally reviewed following labs and imaging studies:  Labs: Labs show the following:   Basic Metabolic Panel: Recent Labs  Lab 06/28/20 0507 06/28/20 0507 07/01/20 0738 07/01/20 0738 07/02/20 0224 07/02/20 1410 07/03/20 0619 07/03/20 1511  NA 132*   < > 128*  --  128* 126* 127* 128*  K 4.8   < > 3.9   < > 4.4  --  4.1  --   CL 101  --  95*  --  95*  --  96*  --   CO2 25  --  25  --  26  --  20*  --   GLUCOSE 228*  --  218*  --  172*  --  188*  --   BUN 23  --  20  --  18  --  14  --   CREATININE 1.20*  --  1.12*  --  1.06*  --  0.91  --   CALCIUM 8.2*  --  8.5*  --  8.4*  --  8.3*  --    < > = values in this interval not displayed.   GFR Estimated Creatinine Clearance: 34 mL/min (by C-G formula based on SCr of 0.91 mg/dL). Liver Function Tests: No results for input(s): AST, ALT, ALKPHOS, BILITOT, PROT, ALBUMIN in the last 168 hours. No results for input(s): LIPASE, AMYLASE in the last 168 hours. No results for input(s): AMMONIA in the last 168 hours. Coagulation profile No results for input(s): INR, PROTIME in the last 168 hours.  CBC: Recent Labs  Lab 06/28/20 0507 07/01/20 0738 07/02/20 0224 07/03/20 0619  WBC 11.4* 13.3* 14.2* 14.1*  NEUTROABS  --   --  10.0*  --   HGB 7.5* 7.5* 7.6* 7.6*  HCT 22.6* 22.6* 23.1* 23.1*  MCV 95.0 92.6 95.1 95.9  PLT 171 282 339 362   Cardiac Enzymes: No results for input(s): CKTOTAL, CKMB, CKMBINDEX, TROPONINI in the last 168 hours. BNP (last 3 results) No results for input(s): PROBNP in the last 8760 hours. CBG: Recent Labs  Lab 07/03/20 0659 07/03/20 1118 07/03/20 1636 07/03/20 2025 07/04/20 0647  GLUCAP 178* 188* 151* 145* 149*   D-Dimer: No results for input(s): DDIMER in the last 72 hours. Hgb A1c: No results for input(s): HGBA1C in the last 72 hours. Lipid Profile: No results for input(s): CHOL, HDL, LDLCALC, TRIG,  CHOLHDL, LDLDIRECT in the last 72 hours. Thyroid function studies: No results for input(s): TSH, T4TOTAL, T3FREE, THYROIDAB in the last 72 hours.  Invalid input(s): FREET3 Anemia work up: No results for input(s): VITAMINB12, FOLATE, FERRITIN, TIBC, IRON, RETICCTPCT in the last 72 hours. Sepsis Labs: Recent Labs  Lab 06/28/20 0507 07/01/20 0738 07/02/20 0224 07/03/20 0619  WBC 11.4* 13.3* 14.2* 14.1*    Microbiology Recent Results (from  the past 240 hour(s))  SARS Coronavirus 2 by RT PCR (hospital order, performed in [facility] hospital lab) Nasopharyngeal Nasopharyngeal Swab     Status: None   Collection Time: 06/25/20  9:48 PM   Specimen: Nasopharyngeal Swab  Result Value Ref Range Status   SARS Coronavirus 2 NEGATIVE NEGATIVE Final    Comment: (NOTE) SARS-CoV-2 target nucleic acids are NOT DETECTED.  The SARS-CoV-2 RNA is generally detectable in upper and lower respiratory specimens during the acute phase of infection. The lowest concentration of SARS-CoV-2 viral copies this assay can detect is 250 copies / mL. A negative result does not preclude SARS-CoV-2 infection and should not be used as the sole basis for treatment or other patient management decisions.  A negative result may occur with improper specimen collection / handling, submission of specimen other than nasopharyngeal swab, presence of viral mutation(s) within the areas targeted by this assay, and inadequate number of viral copies (<250 copies / mL). A negative result must be combined with clinical observations, patient history, and epidemiological information.  Fact Sheet for Patients:   StrictlyIdeas.no  Fact Sheet for Healthcare Providers: BankingDealers.co.za  This test is not yet approved or  cleared by the [place] FDA and has been authorized for detection and/or diagnosis of SARS-CoV-2 by FDA under an Emergency Use Authorization (EUA).  This EUA  will remain in effect (meaning this test can be used) for the duration of the COVID-19 declaration under Section 564(b)(1) of the Act, 21 U.S.C. section 360bbb-3(b)(1), unless the authorization is terminated or revoked sooner.  Performed at [facility], [place] [address], [place], [place] [address]   Surgical pcr screen     Status: None   Collection Time: 06/25/20 11:41 PM   Specimen: Nasal Mucosa; Nasal Swab  Result Value Ref Range Status   MRSA, PCR NEGATIVE NEGATIVE Final   Staphylococcus aureus NEGATIVE NEGATIVE Final    Comment: (NOTE) The Xpert SA Assay (FDA approved for NASAL specimens in patients 67 years of age and older), is one component of a comprehensive surveillance program. It is not intended to diagnose infection nor to guide or monitor treatment. Performed at [facility], [place] [address], [place], [place] [address]   SARS CORONAVIRUS 2 (TAT 6-24 HRS) Nasopharyngeal Nasopharyngeal Swab     Status: None   Collection Time: 07/02/20  5:36 AM   Specimen: Nasopharyngeal Swab  Result Value Ref Range Status   SARS Coronavirus 2 NEGATIVE NEGATIVE Final    Comment: (NOTE) SARS-CoV-2 target nucleic acids are NOT DETECTED.  The SARS-CoV-2 RNA is generally detectable in upper and lower respiratory specimens during the acute phase of infection. Negative results do not preclude SARS-CoV-2 infection, do not rule out co-infections with other pathogens, and should not be used as the sole basis for treatment or other patient management decisions. Negative results must be combined with clinical observations, patient history, and epidemiological information. The expected result is Negative.  Fact Sheet for Patients: SugarRoll.be  Fact Sheet for Healthcare Providers: https://www.woods-mathews.com/  This test is not yet approved or cleared by the [place] FDA and  has been authorized for detection and/or diagnosis of  SARS-CoV-2 by FDA under an Emergency Use Authorization (EUA). This EUA will remain  in effect (meaning this test can be used) for the duration of the COVID-19 declaration under Se ction 564(b)(1) of the Act, 21 U.S.C. section 360bbb-3(b)(1), unless the authorization is terminated or revoked sooner.  Performed at [facility], [address] [place], [place] [address]     Procedures and diagnostic studies:  No results found.  Medications:   . amLODipine  5 mg Oral Daily  . aspirin EC  81 mg Oral Daily  . bisacodyl  10 mg Rectal Once  . citalopram  20 mg Oral Daily  . docusate sodium  100 mg Oral BID  . enoxaparin (LOVENOX) injection  30 mg Subcutaneous Q24H  . feeding supplement (GLUCERNA SHAKE)  237 mL Oral TID BM  . fluticasone  2 spray Each Nare Daily  . insulin aspart  0-5 Units Subcutaneous QHS  . insulin aspart  0-9 Units Subcutaneous TID WC  . insulin glargine  15 Units Subcutaneous Daily  . loratadine  5 mg Oral Daily  . LORazepam  0.5 mg Oral QHS  . metoprolol tartrate  12.5 mg Oral BID  . montelukast  10 mg Oral QHS  . multivitamin with minerals  1 tablet Oral Daily  . mupirocin ointment  1 application Topical BID  . olopatadine  1 drop Both Eyes BID  . oxybutynin  10 mg Oral QHS  . pantoprazole  40 mg Oral Daily  . polyethylene glycol  17 g Oral BID  . pravastatin  20 mg Oral q1800  . pregabalin  25 mg Oral QPC supper  . traMADol  50 mg Oral QHS   Continuous Infusions:   LOS: 9 days   Geradine Girt  [facility]   How to contact the [facility] Attending or Consulting provider [place] or covering provider during after hours [place], for this patient?  1. Check the care team in [facility] and look for a) attending/consulting TRH provider listed and b) the [facility] team listed 2. Log into [email or web address] and use [place]'s universal password to access. If you do not have the password, please contact the hospital operator. 3. Locate the [facility] provider you are  looking for under [facility] and page to a number that you can be directly reached. 4. If you still have difficulty reaching the provider, please page the [facility] (Director on Call) for the Hospitalists listed on amion for assistance.  07/04/2020, 10:29 AM

## 2020-07-04 NOTE — TOC Progression Note (Signed)
Transition of Care [facility]) - Progression Note    Patient Details  Name: Vanessa Gibson MRN: [id number] Date of Birth: June 25, 1928  [facility] CM/SW Contact  Curlene Labrum, RN Phone Number: 07/04/2020, 10:20 AM  Clinical Narrative:    Case management spoke with Dr. Eliseo Squires this morning to communicate that the patient will be ready to transfer to a skilled nursing facility today.  Unfortunately, [facility] does not have an open bed for this patient until Friday, 07/06/2020.  The patient's insurance authorization is valid through tomorrow - so insurance authorization would have to be restarted with the accepting facility.  I called and spoke with the son, Vanessa Gibson, and he is willing to transfer the patient to another accepting facility that can provide a bed for the patient.  I reached out to [facility], [place], and [place] and left messages with each of them to facilitate transferring the patient to another SNF.  Will continue to follow for possible transfer opportunities for the patient.   Expected Discharge Plan: [place] (Inpatient Rehab versus home with home health if patient is able to establish 24 hour supervision with family and/or [place]) Barriers to Discharge: Continued Medical Work up  Expected Discharge Plan and Services Expected Discharge Plan: [place] (Inpatient Rehab versus home with home health if patient is able to establish 24 hour supervision with family and/or [place])   Discharge Planning Services: CM Consult Post Acute Care Choice: IP Rehab, Home Health ([place] versus IP Rehab) Living arrangements for the past 2 months: [place] ([place])                                       Social Determinants of Health (SDOH) Interventions    Readmission Risk Interventions Readmission Risk Prevention Plan 06/27/2020  Transportation Screening Complete  PCP or  Specialist Appt within 5-7 Days Complete  Home Care Screening Complete  Medication Review (RN CM) Complete  Some recent data might be hidden

## 2020-07-05 LAB — GLUCOSE, CAPILLARY
Glucose-Capillary: 177 mg/dL — ABNORMAL HIGH (ref 70–99)
Glucose-Capillary: 286 mg/dL — ABNORMAL HIGH (ref 70–99)
Glucose-Capillary: 163 mg/dL — ABNORMAL HIGH (ref 70–99)
Glucose-Capillary: 196 mg/dL — ABNORMAL HIGH (ref 70–99)

## 2020-07-05 LAB — SARS CORONAVIRUS 2 BY RT PCR (HOSPITAL ORDER, PERFORMED IN ~~LOC~~ HOSPITAL LAB): SARS Coronavirus 2: NEGATIVE

## 2020-07-05 MED ORDER — CEPHALEXIN 500 MG PO CAPS
500.0000 mg | ORAL_CAPSULE | Freq: Two times a day (BID) | ORAL | Status: DC
  Administered 2020-07-05 – 2020-07-06 (×3): 500 mg via ORAL
  Filled 2020-07-05 (×3): qty 1

## 2020-07-05 MED ORDER — INSULIN ASPART 100 UNIT/ML ~~LOC~~ SOLN
6.0000 [IU] | Freq: Three times a day (TID) | SUBCUTANEOUS | Status: DC
  Administered 2020-07-05 – 2020-07-06 (×2): 6 [IU] via SUBCUTANEOUS

## 2020-07-05 NOTE — Plan of Care (Signed)

## 2020-07-05 NOTE — Plan of Care (Signed)

## 2020-07-05 NOTE — Progress Notes (Signed)
Progress Note    Vanessa Gibson  YIF:[id number] DOB: 1928/02/24  DOA: 06/25/2020 PCP: Tammi Sou, MD    Brief Narrative:     Medical records reviewed and are as summarized below:  Vanessa Gibson is an 86 y.o. female with medical history significant ofDM2, HTN, CKD 3a. She is from an ILF. Patient presented secondary to a fall and found to have a right hip fracture.  Stay has been complicated by hyponatremia.  Assessment/Plan:   Principal Problem:   Closed displaced fracture of right femoral neck (HCC) Active Problems:   Hypertension associated with diabetes ([place]), on Lisinopril   CKD stage 3 secondary to diabetes ([place]), on Lisinopril   Uncontrolled type 2 diabetes mellitus with hyperglycemia, with long-term current use of insulin (HCC)   Hyponatremia   Right hip fracture Secondary to fall. S/p ORIF per orthopedic surgery. PT recommending SNF on discharge. Orthopedic surgery recommending WBAT, Lovenox for DVT prophylaxis, follow-up 8-14 days post-op. -vit D low- replace per protocol  Essential hypertension Patient is on amlodipine, metoprolol as an outpatient. -Continue amlodipine metoprolol  Hyponatremia - per urine Na yesterday-- consistent with SIADH -d/c IVF -fluid restrict  Leukocytosis Has remained stable. CT neck was significant for ground glass attenuation possibly secondary to atelectasis vs edema. Possibly related to stress from fall/surgery -incentive spirometry  Neck laceration CT neck without significant issues. Left neck. Stable. Suture removal completed on 8/10. Five stitches removed. Scab located on superior aspect of laceration but does not appear to contain a suture. Overall the wound is without erythema.  Acute blood loss anemia Secondary to surgery. Hemoglobin of 11.7 on admission with baseline of 11-12. Patient is down to 7.5 post-op  -stable -follow up outpatient  CAD Hyperlipidemia History of MI. On aspirin and also on  lovastatin. -Continue Aspirin -Pravastatin while inpatient  Confusion Per son, this is a chronic issue over years. -Recommend outpatient neurology follow-up vs psychiatry follow-up  Diabetes mellitus, type 2 with Diabetic neuropathy On Tresiba as an outpatient. -Continue Lantus while inpatient and SSI -Continue Lyrica  CKD stage IIIa Stable.  Depression -Continue Celexa  GERD Patient is on Prilosec as an outpatient. On Prontonix while inpatient  Chronic vertigo Stable.  Possible pancreatitis Ruled out  History of adrenal adenoma Noted in 2016. Outpatient follow-up  Presumed UTI -present on admission -c/o increasing frequency -keflex x 3 days  Family Communication/Anticipated D/C date and plan/Code Status   DVT prophylaxis: Lovenox ordered. Code Status: Full Code.  Disposition Plan: Status is: Inpatient  Remains inpatient appropriate because:Unsafe d/c plan   Dispo: The patient is from: ALF              Anticipated d/c is to: SNF              Anticipated d/c date is: 1 day              Patient currently is medically stable to d/c.         Medical Consultants:    ortho  Subjective:   C/o being tired of being in bed  Objective:    Vitals:   07/05/20 0426 07/05/20 0839 07/05/20 0912 07/05/20 0914  BP: (!) 125/52 (!) 138/53 (!) 138/53 (!) 138/53  Pulse: 92 87  87  Resp: 15 17    Temp: 98.6 F (37 C) 99 F (37.2 C)    TempSrc: Oral Oral    SpO2: 98% 95%    Weight:      Height:  Intake/Output Summary (Last 24 hours) at 07/05/2020 1537 Last data filed at 07/05/2020 0500 Gross per 24 hour  Intake --  Output 1300 ml  Net -1300 ml   Filed Weights   06/25/20 2012  Weight: 60.3 kg    Exam:   General: Appearance:     Overweight female in no acute distress     Lungs:     Clear to auscultation bilaterally, respirations unlabored  Heart:    Normal heart rate. Normal rhythm. No murmurs, rubs, or gallops.             Data Reviewed:   I have personally reviewed following labs and imaging studies:  Labs: Labs show the following:   Basic Metabolic Panel: Recent Labs  Lab 07/01/20 0738 07/01/20 0738 07/02/20 0224 07/02/20 0224 07/02/20 1410 07/03/20 0619 07/03/20 1511 07/04/20 1037  NA 128*   < > 128*  --  126* 127* 128* 128*  K 3.9   < > 4.4   < >  --  4.1  --  4.1  CL 95*  --  95*  --   --  96*  --  96*  CO2 25  --  26  --   --  20*  --  24  GLUCOSE 218*  --  172*  --   --  188*  --  216*  BUN 20  --  18  --   --  14  --  11  CREATININE 1.12*  --  1.06*  --   --  0.91  --  0.89  CALCIUM 8.5*  --  8.4*  --   --  8.3*  --  8.4*   < > = values in this interval not displayed.   GFR Estimated Creatinine Clearance: 34.8 mL/min (by C-G formula based on SCr of 0.89 mg/dL). Liver Function Tests: No results for input(s): AST, ALT, ALKPHOS, BILITOT, PROT, ALBUMIN in the last 168 hours. No results for input(s): LIPASE, AMYLASE in the last 168 hours. No results for input(s): AMMONIA in the last 168 hours. Coagulation profile No results for input(s): INR, PROTIME in the last 168 hours.  CBC: Recent Labs  Lab 07/01/20 0738 07/02/20 0224 07/03/20 0619 07/04/20 1037  WBC 13.3* 14.2* 14.1* 12.6*  NEUTROABS  --  10.0*  --   --   HGB 7.5* 7.6* 7.6* 7.4*  HCT 22.6* 23.1* 23.1* 22.8*  MCV 92.6 95.1 95.9 94.6  PLT 282 339 362 398   Cardiac Enzymes: No results for input(s): CKTOTAL, CKMB, CKMBINDEX, TROPONINI in the last 168 hours. BNP (last 3 results) No results for input(s): PROBNP in the last 8760 hours. CBG: Recent Labs  Lab 07/04/20 1144 07/04/20 1551 07/04/20 2031 07/05/20 0643 07/05/20 1119  GLUCAP 210* 123* 106* 177* 286*   D-Dimer: No results for input(s): DDIMER in the last 72 hours. Hgb A1c: No results for input(s): HGBA1C in the last 72 hours. Lipid Profile: No results for input(s): CHOL, HDL, LDLCALC, TRIG, CHOLHDL, LDLDIRECT in the last 72 hours. Thyroid function  studies: No results for input(s): TSH, T4TOTAL, T3FREE, THYROIDAB in the last 72 hours.  Invalid input(s): FREET3 Anemia work up: No results for input(s): VITAMINB12, FOLATE, FERRITIN, TIBC, IRON, RETICCTPCT in the last 72 hours. Sepsis Labs: Recent Labs  Lab 07/01/20 0738 07/02/20 0224 07/03/20 0619 07/04/20 1037  WBC 13.3* 14.2* 14.1* 12.6*    Microbiology Recent Results (from the past 240 hour(s))  SARS Coronavirus 2 by RT PCR (hospital order, performed in  [facility] hospital lab) Nasopharyngeal Nasopharyngeal Swab     Status: None   Collection Time: 06/25/20  9:48 PM   Specimen: Nasopharyngeal Swab  Result Value Ref Range Status   SARS Coronavirus 2 NEGATIVE NEGATIVE Final    Comment: (NOTE) SARS-CoV-2 target nucleic acids are NOT DETECTED.  The SARS-CoV-2 RNA is generally detectable in upper and lower respiratory specimens during the acute phase of infection. The lowest concentration of SARS-CoV-2 viral copies this assay can detect is 250 copies / mL. A negative result does not preclude SARS-CoV-2 infection and should not be used as the sole basis for treatment or other patient management decisions.  A negative result may occur with improper specimen collection / handling, submission of specimen other than nasopharyngeal swab, presence of viral mutation(s) within the areas targeted by this assay, and inadequate number of viral copies (<250 copies / mL). A negative result must be combined with clinical observations, patient history, and epidemiological information.  Fact Sheet for Patients:   StrictlyIdeas.no  Fact Sheet for Healthcare Providers: BankingDealers.co.za  This test is not yet approved or  cleared by the [place] FDA and has been authorized for detection and/or diagnosis of SARS-CoV-2 by FDA under an Emergency Use Authorization (EUA).  This EUA will remain in effect (meaning this test can be used) for  the duration of the COVID-19 declaration under Section 564(b)(1) of the Act, 21 U.S.C. section 360bbb-3(b)(1), unless the authorization is terminated or revoked sooner.  Performed at [facility], [place] [address] [place], [place] [address]   Surgical pcr screen     Status: None   Collection Time: 06/25/20 11:41 PM   Specimen: Nasal Mucosa; Nasal Swab  Result Value Ref Range Status   MRSA, PCR NEGATIVE NEGATIVE Final   Staphylococcus aureus NEGATIVE NEGATIVE Final    Comment: (NOTE) The Xpert SA Assay (FDA approved for NASAL specimens in patients 51 years of age and older), is one component of a comprehensive surveillance program. It is not intended to diagnose infection nor to guide or monitor treatment. Performed at [facility], [place] [address] [place], [place] [address]   SARS CORONAVIRUS 2 (TAT 6-24 HRS) Nasopharyngeal Nasopharyngeal Swab     Status: None   Collection Time: 07/02/20  5:36 AM   Specimen: Nasopharyngeal Swab  Result Value Ref Range Status   SARS Coronavirus 2 NEGATIVE NEGATIVE Final    Comment: (NOTE) SARS-CoV-2 target nucleic acids are NOT DETECTED.  The SARS-CoV-2 RNA is generally detectable in upper and lower respiratory specimens during the acute phase of infection. Negative results do not preclude SARS-CoV-2 infection, do not rule out co-infections with other pathogens, and should not be used as the sole basis for treatment or other patient management decisions. Negative results must be combined with clinical observations, patient history, and epidemiological information. The expected result is Negative.  Fact Sheet for Patients: SugarRoll.be  Fact Sheet for Healthcare Providers: https://www.woods-mathews.com/  This test is not yet approved or cleared by the [place] FDA and  has been authorized for detection and/or diagnosis of SARS-CoV-2 by FDA under an Emergency Use Authorization (EUA).  This EUA will remain  in effect (meaning this test can be used) for the duration of the COVID-19 declaration under Se ction 564(b)(1) of the Act, 21 U.S.C. section 360bbb-3(b)(1), unless the authorization is terminated or revoked sooner.  Performed at [facility], [place] [address], [place], [place] [address]   SARS Coronavirus 2 by RT PCR (hospital  order, performed in [facility] hospital lab) Nasopharyngeal Nasopharyngeal Swab     Status: None   Collection Time: 07/05/20 11:58 AM   Specimen: Nasopharyngeal Swab  Result Value Ref Range Status   SARS Coronavirus 2 NEGATIVE NEGATIVE Final    Comment: (NOTE) SARS-CoV-2 target nucleic acids are NOT DETECTED.  The SARS-CoV-2 RNA is generally detectable in upper and lower respiratory specimens during the acute phase of infection. The lowest concentration of SARS-CoV-2 viral copies this assay can detect is 250 copies / mL. A negative result does not preclude SARS-CoV-2 infection and should not be used as the sole basis for treatment or other patient management decisions.  A negative result may occur with improper specimen collection / handling, submission of specimen other than nasopharyngeal swab, presence of viral mutation(s) within the areas targeted by this assay, and inadequate number of viral copies (<250 copies / mL). A negative result must be combined with clinical observations, patient history, and epidemiological information.  Fact Sheet for Patients:   StrictlyIdeas.no  Fact Sheet for Healthcare Providers: BankingDealers.co.za  This test is not yet approved or  cleared by the [place] FDA and has been authorized for detection and/or diagnosis of SARS-CoV-2 by FDA under an Emergency Use Authorization (EUA).  This EUA will remain in effect (meaning this test can be used) for the duration of the COVID-19 declaration under Section 564(b)(1) of the Act, 21 U.S.C. section  360bbb-3(b)(1), unless the authorization is terminated or revoked sooner.  Performed at [facility], [place] [address] [place], [place] [address]     Procedures and diagnostic studies:  No results found.  Medications:   . amLODipine  5 mg Oral Daily  . aspirin EC  81 mg Oral Daily  . bisacodyl  10 mg Rectal Once  . calcium-vitamin D  1 tablet Oral Q breakfast  . cephALEXin  500 mg Oral Q12H  . citalopram  20 mg Oral Daily  . docusate sodium  100 mg Oral BID  . enoxaparin (LOVENOX) injection  30 mg Subcutaneous Q24H  . feeding supplement (GLUCERNA SHAKE)  237 mL Oral TID BM  . fluticasone  2 spray Each Nare Daily  . insulin aspart  0-5 Units Subcutaneous QHS  . insulin aspart  0-9 Units Subcutaneous TID WC  . insulin glargine  15 Units Subcutaneous Daily  . loratadine  5 mg Oral Daily  . LORazepam  0.5 mg Oral QHS  . metoprolol tartrate  12.5 mg Oral BID  . montelukast  10 mg Oral QHS  . multivitamin with minerals  1 tablet Oral Daily  . mupirocin ointment  1 application Topical BID  . olopatadine  1 drop Both Eyes BID  . oxybutynin  10 mg Oral QHS  . pantoprazole  40 mg Oral Daily  . polyethylene glycol  17 g Oral BID  . pravastatin  20 mg Oral q1800  . pregabalin  25 mg Oral QPC supper  . traMADol  50 mg Oral QHS   Continuous Infusions:   LOS: 10 days   Geradine Girt  [facility]   How to contact the [facility] Attending or Consulting provider [place] or covering provider during after hours [place], for this patient?  1. Check the care team in [facility] and look for a) attending/consulting TRH provider listed and b) the [facility] team listed 2. Log into [email or web address] and use [place]'s universal password to access. If you do not have the password, please contact the hospital  operator. 3. Locate the [facility] provider you are looking for under [facility] and page to a number that you can be directly reached. 4. If you still have difficulty reaching the provider,  please page the [facility] (Director on Call) for the Hospitalists listed on amion for assistance.  07/05/2020, 3:37 PM

## 2020-07-05 NOTE — TOC Progression Note (Signed)
Transition of Care [facility]) - Progression Note    Patient Details  Name: Vanessa Gibson MRN: [id number] Date of Birth: 1927/11/26  Transition of Care [facility]) CM/SW [place], RN Phone Number: 07/05/2020, 9:46 AM  Clinical Narrative:     Case management spoke with [place] late last evening and was about to communicate with Kirke Shaggy, CM with [facility] and they will have an available bed for the patient.  Insurance authorization was restarted by the facility and as soon as authorization is approved - the patient will be able to transfer since [facility] care was full capacity - son was notified and aware.  Expected Discharge Plan: [place] (Inpatient Rehab versus home with home health if patient is able to establish 24 hour supervision with family and/or [place]) Barriers to Discharge: Continued Medical Work up  Expected Discharge Plan and Services Expected Discharge Plan: [place] (Inpatient Rehab versus home with home health if patient is able to establish 24 hour supervision with family and/or [place])   Discharge Planning Services: CM Consult Post Acute Care Choice: IP Rehab, Home Health ([place] versus IP Rehab) Living arrangements for the past 2 months: [place] ([place])                                       Social Determinants of Health (SDOH) Interventions    Readmission Risk Interventions Readmission Risk Prevention Plan 06/27/2020  Transportation Screening Complete  PCP or Specialist Appt within 5-7 Days Complete  Home Care Screening Complete  Medication Review (RN CM) Complete  Some recent data might be hidden

## 2020-07-05 NOTE — Progress Notes (Signed)
Physical Therapy Treatment Patient Details Name: Vanessa Gibson MRN: [id number] DOB: March 10, 1928 Today's Date: 07/05/2020    History of Present Illness Pt is an 86 y.o. female admitted 06/25/20 after fall sustaining R femoral neck fx, L neck laceration. S/p R hip IMN 8/3. PMH includes DM2, HTN, CKD 3, breast CX, OA, MI, falls vertigo-related(?).    PT Comments    Pt seated in recliner.  She is frustrated she is still in the hospital.  CM spoke with patient regarding wait on insurance authorization.  Pt continues to benefit from snf placement at this time.  Ice pack applied to R hip and chest.    Follow Up Recommendations  Supervision/Assistance - 24 hour;SNF     Equipment Recommendations   (defer to post acute)    Recommendations for Other Services       Precautions / Restrictions Precautions Precautions: Fall Restrictions Weight Bearing Restrictions: Yes RLE Weight Bearing: Weight bearing as tolerated    Mobility  Bed Mobility               General bed mobility comments: pt seated in recliner on arrival this session.  Transfers Overall transfer level: Needs assistance Equipment used: Rolling walker (2 wheeled) Transfers: Sit to/from Stand Sit to Stand: Min assist         General transfer comment: Cues for hand placement to and from seated surface.  Ambulation/Gait Ambulation/Gait assistance: Min assist Gait Distance (Feet): 50 Feet Assistive device: Rolling walker (2 wheeled) Gait Pattern/deviations: Step-to pattern;Antalgic;Decreased weight shift to right;Trunk flexed;Leaning posteriorly Gait velocity: Decreased   General Gait Details: Cues for posture and RW safety.  Pt with several standing rest periods.   Stairs             Wheelchair Mobility    Modified Rankin (Stroke Patients Only)       Balance Overall balance assessment: Needs assistance Sitting-balance support: No upper extremity supported;Feet unsupported Sitting balance-Leahy  Scale: Poor       Standing balance-Leahy Scale: Poor                              Cognition Arousal/Alertness: Lethargic Behavior During Therapy: Impulsive;Flat affect Overall Cognitive Status: Impaired/Different from baseline Area of Impairment: Orientation;Memory;Following commands;Safety/judgement;Awareness;Problem solving                 Orientation Level: Disoriented to;Time   Memory: Decreased short-term memory Following Commands: Follows one step commands inconsistently;Follows multi-step commands inconsistently Safety/Judgement: Decreased awareness of deficits;Decreased awareness of safety Awareness: Emergent Problem Solving: Requires tactile cues;Requires verbal cues General Comments: No hallucinations this pm      Exercises      General Comments        Pertinent Vitals/Pain Pain Assessment: Faces Faces Pain Scale: Hurts even more Pain Location: R hip Pain Descriptors / Indicators: Grimacing;Guarding;Sore Pain Intervention(s): Monitored during session;Repositioned    Home Living                      Prior Function            PT Goals (current goals can now be found in the care plan section) Acute Rehab PT Goals Patient Stated Goal: Improve independence Potential to Achieve Goals: Good Progress towards PT goals: Progressing toward goals    Frequency    Min 3X/week      PT Plan Current plan remains appropriate    Co-evaluation  AM-PAC PT "6 Clicks" Mobility   Outcome Measure  Help needed turning from your back to your side while in a flat bed without using bedrails?: A Little Help needed moving from lying on your back to sitting on the side of a flat bed without using bedrails?: A Lot Help needed moving to and from a bed to a chair (including a wheelchair)?: A Little Help needed standing up from a chair using your arms (e.g., wheelchair or bedside chair)?: A Little Help needed to walk in hospital  room?: A Little Help needed climbing 3-5 steps with a railing? : A Little 6 Click Score: 17    End of Session Equipment Utilized During Treatment: Gait belt Activity Tolerance: Patient tolerated treatment well;Patient limited by pain Patient left: in chair;with call bell/phone within reach;with chair alarm set Nurse Communication: Mobility status PT Visit Diagnosis: Unsteadiness on feet (R26.81);Other abnormalities of gait and mobility (R26.89);Pain Pain - Right/Left: Right Pain - part of body: Hip     Time: 4707-6151 PT Time Calculation (min) (ACUTE ONLY): 24 min  Charges:  $Gait Training: 8-22 mins $Therapeutic Activity: 8-22 mins                     Erasmo Leventhal , PTA Acute Rehabilitation Services Pager [phone] Office [phone]     Sephira Zellman Eli Hose 07/05/2020, 2:51 PM

## 2020-07-06 DIAGNOSIS — E1122 Type 2 diabetes mellitus with diabetic chronic kidney disease: Secondary | ICD-10-CM | POA: Diagnosis not present

## 2020-07-06 DIAGNOSIS — H353231 Exudative age-related macular degeneration, bilateral, with active choroidal neovascularization: Secondary | ICD-10-CM | POA: Diagnosis not present

## 2020-07-06 DIAGNOSIS — I959 Hypotension, unspecified: Secondary | ICD-10-CM | POA: Diagnosis not present

## 2020-07-06 DIAGNOSIS — S72001A Fracture of unspecified part of neck of right femur, initial encounter for closed fracture: Secondary | ICD-10-CM | POA: Diagnosis not present

## 2020-07-06 DIAGNOSIS — F329 Major depressive disorder, single episode, unspecified: Secondary | ICD-10-CM | POA: Diagnosis not present

## 2020-07-06 DIAGNOSIS — G4700 Insomnia, unspecified: Secondary | ICD-10-CM | POA: Diagnosis not present

## 2020-07-06 DIAGNOSIS — R52 Pain, unspecified: Secondary | ICD-10-CM | POA: Diagnosis not present

## 2020-07-06 DIAGNOSIS — N183 Chronic kidney disease, stage 3 unspecified: Secondary | ICD-10-CM | POA: Diagnosis not present

## 2020-07-06 DIAGNOSIS — J449 Chronic obstructive pulmonary disease, unspecified: Secondary | ICD-10-CM | POA: Diagnosis not present

## 2020-07-06 DIAGNOSIS — E119 Type 2 diabetes mellitus without complications: Secondary | ICD-10-CM | POA: Diagnosis not present

## 2020-07-06 DIAGNOSIS — Z4789 Encounter for other orthopedic aftercare: Secondary | ICD-10-CM | POA: Diagnosis not present

## 2020-07-06 DIAGNOSIS — M2550 Pain in unspecified joint: Secondary | ICD-10-CM | POA: Diagnosis not present

## 2020-07-06 DIAGNOSIS — I10 Essential (primary) hypertension: Secondary | ICD-10-CM | POA: Diagnosis not present

## 2020-07-06 DIAGNOSIS — M1990 Unspecified osteoarthritis, unspecified site: Secondary | ICD-10-CM | POA: Diagnosis not present

## 2020-07-06 DIAGNOSIS — Z0189 Encounter for other specified special examinations: Secondary | ICD-10-CM | POA: Diagnosis not present

## 2020-07-06 DIAGNOSIS — K219 Gastro-esophageal reflux disease without esophagitis: Secondary | ICD-10-CM | POA: Diagnosis not present

## 2020-07-06 DIAGNOSIS — I469 Cardiac arrest, cause unspecified: Secondary | ICD-10-CM | POA: Diagnosis not present

## 2020-07-06 DIAGNOSIS — Z7401 Bed confinement status: Secondary | ICD-10-CM | POA: Diagnosis not present

## 2020-07-06 DIAGNOSIS — S72002D Fracture of unspecified part of neck of left femur, subsequent encounter for closed fracture with routine healing: Secondary | ICD-10-CM | POA: Diagnosis not present

## 2020-07-06 LAB — GLUCOSE, CAPILLARY
Glucose-Capillary: 183 mg/dL — ABNORMAL HIGH (ref 70–99)
Glucose-Capillary: 241 mg/dL — ABNORMAL HIGH (ref 70–99)

## 2020-07-06 MED ORDER — CEPHALEXIN 500 MG PO CAPS: 500.0000 mg | ORAL_CAPSULE | Freq: Two times a day (BID) | ORAL | Status: DC

## 2020-07-06 MED ORDER — GLUCERNA SHAKE PO LIQD: 237.0000 mL | Freq: Three times a day (TID) | ORAL | 0 refills | Status: AC

## 2020-07-06 MED ORDER — ENOXAPARIN SODIUM 30 MG/0.3ML ~~LOC~~ SOLN: 30.0000 mg | SUBCUTANEOUS | Status: DC

## 2020-07-06 MED ORDER — LORAZEPAM 0.5 MG PO TABS: 0.5000 mg | ORAL_TABLET | Freq: Every day | ORAL | 0 refills | Status: DC

## 2020-07-06 MED ORDER — TRAMADOL HCL 50 MG PO TABS: 50.0000 mg | ORAL_TABLET | Freq: Every day | ORAL | 0 refills | Status: DC

## 2020-07-06 MED ORDER — HYDROCODONE-ACETAMINOPHEN 5-325 MG PO TABS: 1.0000 | ORAL_TABLET | Freq: Four times a day (QID) | ORAL | 0 refills | Status: AC | PRN

## 2020-07-06 MED ORDER — CALCIUM CARBONATE-VITAMIN D 500-200 MG-UNIT PO TABS: 1.0000 | ORAL_TABLET | Freq: Every day | ORAL | Status: DC

## 2020-07-06 MED ORDER — DOCUSATE SODIUM 100 MG PO CAPS: 100.0000 mg | ORAL_CAPSULE | Freq: Two times a day (BID) | ORAL | 0 refills | Status: AC

## 2020-07-06 NOTE — Plan of Care (Signed)

## 2020-07-06 NOTE — Plan of Care (Signed)
  Problem: Education: Goal: Knowledge of General Education information will improve Description: Including pain rating scale, medication(s)/side effects and non-pharmacologic comfort measures Outcome: Progressing   Problem: Health Behavior/Discharge Planning: Goal: Ability to manage health-related needs will improve Outcome: Progressing   Problem: Activity: Goal: Risk for activity intolerance will decrease Outcome: Progressing   

## 2020-07-06 NOTE — TOC Transition Note (Addendum)
Transition of Care [facility]) - CM/SW Discharge Note   Patient Details  Name: Vanessa Gibson MRN: [id number] Date of Birth: 1927-12-10  [facility]) CM/SW Contact:  Curlene Labrum, RN Phone Number: 07/06/2020, 9:05 AM   Clinical Narrative:    Case management spoke with Kirke Shaggy, CM of [facility] and they received insurance authorization and approval last night for the patient.  I called this morning and spoke with Kirke Shaggy and patient will transfer to the facility this morning.  Clinicals were all placed in the hub for the facility - COVID is negative.  I called the son, [name] and notified him of the patient's transfer this morning and he is calling the physician's office of the patient and asking that they send record of the patient's vaccines to the facility - contact information given to the son for [facility] to give to the PCP.  I called [facility] and ambulance scheduled for 1100.  [place], RN is to call report to [facility] - [phone]   Final next level of care: [place] Barriers to Discharge: Continued Medical Work up   Patient Goals and CMS Choice Patient states their goals for this hospitalization and ongoing recovery are:: Patient wants to go home with services and not be admitted to a Rehab SNF CMS Medicare.gov Compare Post Acute Care list provided to:: Patient Choice offered to / list presented to : Patient  Discharge Placement                       Discharge Plan and Services   Discharge Planning Services: CM Consult Post Acute Care Choice: IP Rehab, Home Health ([place] versus IP Rehab)                               Social Determinants of Health (SDOH) Interventions     Readmission Risk Interventions Readmission Risk Prevention Plan 06/27/2020  Transportation Screening Complete  PCP or Specialist Appt within 5-7 Days Complete  Home Care Screening Complete  Medication Review (RN  CM) Complete  Some recent data might be hidden

## 2020-07-06 NOTE — Progress Notes (Signed)
Report given to BEth.LPN at [place] .All questions and concerns were answered.

## 2020-07-06 NOTE — Discharge Summary (Addendum)
Physician Discharge Summary  Vanessa Gibson RXV:[id number] DOB: 1927/11/30 DOA: 06/25/2020  PCP: Tammi Sou, MD  Admit date: 06/25/2020 Discharge date: 07/06/2020  Admitted From: home Discharge disposition: SNF   Recommendations for Outpatient Follow-Up:   1. Keflex through 8/14 2. Continue to titrate insulin  3. Cbc/bmp on Monday 4. Would recommend palliative care follow at facility   Discharge Diagnosis:   Principal Problem:   Closed displaced fracture of right femoral neck (HCC) Active Problems:   Hypertension associated with diabetes ([place]), on Lisinopril   CKD stage 3 secondary to diabetes ([place]), on Lisinopril   Uncontrolled type 2 diabetes mellitus with hyperglycemia, with long-term current use of insulin (HCC)   Hyponatremia    Discharge Condition: Improved.  Diet recommendation: Low sodium, heart healthy.  Carbohydrate-modified.  Wound care: None.  Code status: Full.   History of Present Illness:   Vanessa Gibson is a 86 y.o. female with medical history significant of DM2, HTN, CKD 3a.  At baseline pt is in ILF, ambulates unassisted (without use of walker) per patient.  Pt had skin tumor removal from L neck a couple of weeks ago.  Pt had mechanical fall at home today.  R hip pain, deformity, inability to bear wt post fall.  Also has laceration / opening of the incision to her L neck.   Hospital Course by Problem:   Right hip fracture Secondary to fall. S/p ORIF per orthopedic surgery. PT recommending SNF on discharge. Orthopedic surgery recommending WBAT, Lovenox for DVT prophylaxis, follow-up 8-14 days post-op- continue lovenox until d/c'd by ortho-- reached out to Dr. Marcelino Scot on 8/13 for clarification -vit D low- replace per protocol  Essential hypertension -Continue amlodipine/metoprolol  Hyponatremia - per urine Na yesterday-- consistent with SIADH -d/c IVF -fluid restrict- follow up on Monday  Leukocytosis Has remained stable. CT  neck was significant for ground glass attenuation possibly secondary to atelectasis vs edema. Possibly related to stress from fall/surgery -incentive spirometry -trending down -see below re: ?UTI  Neck laceration CT neck without significant issues.Left neck.Stable.Suture removal completed on 8/10. Five stitches removed. Scab located on superior aspect of laceration but does not appear to contain a suture. Overall the wound is without erythema.  Acute blood loss anemia Secondary to surgery. Hemoglobin of 11.7 on admission with baseline of 11-12. Patient is down to 7.5 post-op  -stable -follow up outpatient  CAD Hyperlipidemia History of MI. On aspirin and also on lovastatin. -Continue Aspirin -Pravastatin while inpatient  Confusion Per son, this is a chronic issue over years. -Recommend outpatient neurology follow-up  Diabetes mellitus, type 2 with Diabetic neuropathy On Tresiba as an outpatient. -Continue Lantus while inpatient and SSI -Continue Lyrica  CKD stage IIIa Stable.  Depression -Continue Celexa  GERD Patient is on Prilosec as an outpatient. On Prontonix while inpatient  Chronic vertigo Stable.  Possible pancreatitis Ruled out  History of adrenal adenoma Noted in 2016. Outpatient follow-up  Presumed UTI -present on admission per U/A having many bacteria -c/o increasing frequency -keflex x 3 days    Medical Consultants:   ortho   Discharge Exam:   Vitals:   07/06/20 0427 07/06/20 0747  BP: 135/62 137/67  Pulse: 80 88  Resp: 15 14  Temp: 98 F (36.7 C) 98.8 F (37.1 C)  SpO2: 97% 98%   Vitals:   07/05/20 0914 07/05/20 1929 07/06/20 0427 07/06/20 0747  BP: (!) 138/53 136/67 135/62 137/67  Pulse: 87 82 80 88  Resp:  16  15 14  Temp:  97.8 F (36.6 C) 98 F (36.7 C) 98.8 F (37.1 C)  TempSrc:  Oral Oral Oral  SpO2:  98% 97% 98%  Weight:      Height:        General exam: Appears calm and comfortable.   The  results of significant diagnostics from this hospitalization (including imaging, microbiology, ancillary and laboratory) are listed below for reference.     Procedures and Diagnostic Studies:   DG Chest 1 View  Result Date: 06/25/2020 CLINICAL DATA:  86 year old female with fall and right hip pain. EXAM: CHEST  1 VIEW COMPARISON:  Chest radiograph dated 04/20/2018. FINDINGS: No focal consolidation, pleural effusion, or pneumothorax. There is mild cardiomegaly. Atherosclerotic calcification of the aorta. No acute osseous pathology. IMPRESSION: No acute cardiopulmonary process. Electronically Signed   By: Anner Crete M.D.   On: 06/25/2020 21:07   CT Angio Neck W and/or Wo Contrast  Result Date: 06/25/2020 CLINICAL DATA:  Moderate to severe neck trauma. Laceration to both sides of the neck. Fall. EXAM: CT ANGIOGRAPHY NECK TECHNIQUE: Multidetector CT imaging of the neck was performed using the standard protocol during bolus administration of intravenous contrast. Multiplanar CT image reconstructions and MIPs were obtained to evaluate the vascular anatomy. Carotid stenosis measurements (when applicable) are obtained utilizing NASCET criteria, using the distal internal carotid diameter as the denominator. CONTRAST:  65m OMNIPAQUE IOHEXOL 350 MG/ML SOLN COMPARISON:  None. FINDINGS: Aortic arch: The left common carotid artery and innominate artery share a common origin. Minimal atherosclerotic changes present at the origin of the left subclavian artery. Right carotid system: Right common carotid artery is within normal limits. Bifurcation is unremarkable. Cervical right ICA is normal. Intracranial atherosclerotic changes are present without significant stenosis. Left carotid system: The left common carotid artery is within normal limits. Bifurcation is unremarkable. Mild tortuosity is present in the cervical left ICA without significant stenosis. Intracranial atherosclerotic changes are present without  significant stenosis. Vertebral arteries: Left vertebral artery is the dominant vessel. Both vertebral arteries originate from the subclavian arteries without significant stenosis. No significant stenosis is present in either vertebral artery in the neck. Vertebrobasilar junction is normal. Visualized basilar artery is unremarkable. Skeleton: Multilevel degenerative changes are present cervical spine. Chronic loss of disc height is noted from C2-T1. No focal lytic or blastic lesions are present. Other neck: Left submandibular laceration is noted. Stranding is present within the subcutaneous fat. No deep penetration is evident. Upper chest: Patchy ground-glass attenuation is present at the lung apices bilaterally. Thoracic inlet is normal. IMPRESSION: 1. Left submandibular laceration with stranding within the subcutaneous fat. No deep penetration. 2. No acute vascular injury. 3. No significant vascular disease in the neck. 4. Atherosclerotic changes within the cavernous internal carotid arteries bilaterally without significant stenosis. 5. Multilevel degenerative changes of the cervical spine. 6. Patchy ground-glass attenuation at the lung apices bilaterally. This may represent atelectasis or edema. Infection is not excluded. Electronically Signed   By: CSan MorelleM.D.   On: 06/25/2020 21:42   DG C-Arm 1-60 Min  Result Date: 06/26/2020 CLINICAL DATA:  ORIF. EXAM: RIGHT FEMUR 2 VIEWS; DG C-ARM 1-60 MIN COMPARISON:  06/25/2020. FINDINGS: ORIF right femur. Hardware intact. Anatomic alignment. Peripheral vascular calcification. IMPRESSION: ORIF right femur with anatomic alignment. Electronically Signed   By: TMarcello Moores Register   On: 06/26/2020 12:55   DG Hip Unilat W or Wo Pelvis 2-3 Views Right  Result Date: 06/25/2020 CLINICAL DATA:  86year old female with fall and  right hip pain. EXAM: DG HIP (WITH OR WITHOUT PELVIS) 2-3V RIGHT COMPARISON:  None. FINDINGS: There is a mildly displaced, comminuted and  angulated fracture of the right femoral neck. The bones are osteopenic. There is no dislocation. The soft tissues are unremarkable. IMPRESSION: Right femoral neck fracture. Electronically Signed   By: Anner Crete M.D.   On: 06/25/2020 21:08   DG FEMUR, MIN 2 VIEWS RIGHT  Result Date: 06/26/2020 CLINICAL DATA:  ORIF. EXAM: RIGHT FEMUR 2 VIEWS; DG C-ARM 1-60 MIN COMPARISON:  06/25/2020. FINDINGS: ORIF right femur. Hardware intact. Anatomic alignment. Peripheral vascular calcification. IMPRESSION: ORIF right femur with anatomic alignment. Electronically Signed   By: Marcello Moores  Register   On: 06/26/2020 12:55   DG FEMUR PORT, MIN 2 VIEWS RIGHT  Result Date: 06/26/2020 CLINICAL DATA:  Intramedullary nail placement EXAM: RIGHT FEMUR PORTABLE 2 VIEW COMPARISON:  June 25, 2020 and intraoperative images June 26, 2020 FINDINGS: Frontal and lateral views were obtained. There is screw and rod fixation through an intertrochanteric femur fracture on the right. Alignment at the fracture site is near anatomic. Screw tip in proximal femoral head. No new fracture. No dislocation. No appreciable knee joint effusion. Osteoarthritic change noted in the knee joint region. IMPRESSION: Screw and nail fixation through intertrochanteric femur fracture. No new fracture. No dislocation. Arthropathy in the right knee joint region noted. Electronically Signed   By: Lowella Grip III M.D.   On: 06/26/2020 13:51     Labs:   Basic Metabolic Panel: Recent Labs  Lab 07/01/20 0738 07/01/20 0738 07/02/20 0224 07/02/20 0224 07/02/20 1410 07/03/20 0619 07/03/20 1511 07/04/20 1037  NA 128*   < > 128*  --  126* 127* 128* 128*  K 3.9   < > 4.4   < >  --  4.1  --  4.1  CL 95*  --  95*  --   --  96*  --  96*  CO2 25  --  26  --   --  20*  --  24  GLUCOSE 218*  --  172*  --   --  188*  --  216*  BUN 20  --  18  --   --  14  --  11  CREATININE 1.12*  --  1.06*  --   --  0.91  --  0.89  CALCIUM 8.5*  --  8.4*  --   --  8.3*   --  8.4*   < > = values in this interval not displayed.   GFR Estimated Creatinine Clearance: 34.8 mL/min (by C-G formula based on SCr of 0.89 mg/dL). Liver Function Tests: No results for input(s): AST, ALT, ALKPHOS, BILITOT, PROT, ALBUMIN in the last 168 hours. No results for input(s): LIPASE, AMYLASE in the last 168 hours. No results for input(s): AMMONIA in the last 168 hours. Coagulation profile No results for input(s): INR, PROTIME in the last 168 hours.  CBC: Recent Labs  Lab 07/01/20 0738 07/02/20 0224 07/03/20 0619 07/04/20 1037  WBC 13.3* 14.2* 14.1* 12.6*  NEUTROABS  --  10.0*  --   --   HGB 7.5* 7.6* 7.6* 7.4*  HCT 22.6* 23.1* 23.1* 22.8*  MCV 92.6 95.1 95.9 94.6  PLT 282 339 362 398   Cardiac Enzymes: No results for input(s): CKTOTAL, CKMB, CKMBINDEX, TROPONINI in the last 168 hours. BNP: Invalid input(s): POCBNP CBG: Recent Labs  Lab 07/05/20 0643 07/05/20 1119 07/05/20 1650 07/05/20 1932 07/06/20 0628  GLUCAP 177* 286* 163* 196*  183*   D-Dimer No results for input(s): DDIMER in the last 72 hours. Hgb A1c No results for input(s): HGBA1C in the last 72 hours. Lipid Profile No results for input(s): CHOL, HDL, LDLCALC, TRIG, CHOLHDL, LDLDIRECT in the last 72 hours. Thyroid function studies No results for input(s): TSH, T4TOTAL, T3FREE, THYROIDAB in the last 72 hours.  Invalid input(s): FREET3 Anemia work up No results for input(s): VITAMINB12, FOLATE, FERRITIN, TIBC, IRON, RETICCTPCT in the last 72 hours. Microbiology Recent Results (from the past 240 hour(s))  SARS CORONAVIRUS 2 (TAT 6-24 HRS) Nasopharyngeal Nasopharyngeal Swab     Status: None   Collection Time: 07/02/20  5:36 AM   Specimen: Nasopharyngeal Swab  Result Value Ref Range Status   SARS Coronavirus 2 NEGATIVE NEGATIVE Final    Comment: (NOTE) SARS-CoV-2 target nucleic acids are NOT DETECTED.  The SARS-CoV-2 RNA is generally detectable in upper and lower respiratory specimens  during the acute phase of infection. Negative results do not preclude SARS-CoV-2 infection, do not rule out co-infections with other pathogens, and should not be used as the sole basis for treatment or other patient management decisions. Negative results must be combined with clinical observations, patient history, and epidemiological information. The expected result is Negative.  Fact Sheet for Patients: SugarRoll.be  Fact Sheet for Healthcare Providers: https://www.woods-mathews.com/  This test is not yet approved or cleared by the [place] FDA and  has been authorized for detection and/or diagnosis of SARS-CoV-2 by FDA under an Emergency Use Authorization (EUA). This EUA will remain  in effect (meaning this test can be used) for the duration of the COVID-19 declaration under Se ction 564(b)(1) of the Act, 21 U.S.C. section 360bbb-3(b)(1), unless the authorization is terminated or revoked sooner.  Performed at [facility], [place] [address], [place], [place] [address]   SARS Coronavirus 2 by RT PCR (hospital order, performed in [facility] hospital lab) Nasopharyngeal Nasopharyngeal Swab     Status: None   Collection Time: 07/05/20 11:58 AM   Specimen: Nasopharyngeal Swab  Result Value Ref Range Status   SARS Coronavirus 2 NEGATIVE NEGATIVE Final    Comment: (NOTE) SARS-CoV-2 target nucleic acids are NOT DETECTED.  The SARS-CoV-2 RNA is generally detectable in upper and lower respiratory specimens during the acute phase of infection. The lowest concentration of SARS-CoV-2 viral copies this assay can detect is 250 copies / mL. A negative result does not preclude SARS-CoV-2 infection and should not be used as the sole basis for treatment or other patient management decisions.  A negative result may occur with improper specimen collection / handling, submission of specimen other than nasopharyngeal swab, presence of viral  mutation(s) within the areas targeted by this assay, and inadequate number of viral copies (<250 copies / mL). A negative result must be combined with clinical observations, patient history, and epidemiological information.  Fact Sheet for Patients:   StrictlyIdeas.no  Fact Sheet for Healthcare Providers: BankingDealers.co.za  This test is not yet approved or  cleared by the [place] FDA and has been authorized for detection and/or diagnosis of SARS-CoV-2 by FDA under an Emergency Use Authorization (EUA).  This EUA will remain in effect (meaning this test can be used) for the duration of the COVID-19 declaration under Section 564(b)(1) of the Act, 21 U.S.C. section 360bbb-3(b)(1), unless the authorization is terminated or revoked sooner.  Performed at [facility], [place] [address], [place], [place] [address]      Discharge Instructions:   Discharge Instructions  Diet - low sodium heart healthy   Complete by: As directed    Discharge wound care:   Complete by: As directed    Reinforce dressing as needed   Increase activity slowly   Complete by: As directed      Allergies as of 07/06/2020   No Known Allergies     Medication List    STOP taking these medications   azelastine 0.1 % nasal spray Commonly known as: ASTELIN   Stiolto Respimat 2.5-2.5 MCG/ACT Aers Generic drug: Tiotropium Bromide-Olodaterol     TAKE these medications   Accu-Chek Guide test strip Generic drug: glucose blood CHECK BLOOD SUGAR THREE TO FOUR TIMES DAILY What changed: See the new instructions.   Accu-Chek Guide w/Device Kit Use Accu Chek Guide meter to check blood sugar 3-4 times daily. What changed:   how much to take  how to take this  when to take this   Accu-Chek Softclix Lancets lancets Use as instructed to check blood sugar 3-4 times daily. What changed:   how much to take  how to take this  when to take this    amLODipine 5 MG tablet Commonly known as: NORVASC Take 1 tablet (5 mg total) by mouth daily.   aspirin EC 81 MG tablet Take 81 mg by mouth daily.   B-D SINGLE USE SWABS REGULAR Pads USE TO CLEAN FINGER BEFORE CHECKING BLOOD SUGARS AND CLEAN SKIN BEFORE INSULIN INJECTIONS. What changed: See the new instructions.   BD Pen Needle Nano U/F 32G X 4 MM Misc Generic drug: Insulin Pen Needle USE TO INJECT INSULINS 4 TIMES DAILY. What changed: See the new instructions.   calcium-vitamin D 500-200 MG-UNIT tablet Commonly known as: OSCAL WITH D Take 1 tablet by mouth daily with breakfast.   Centrum Silver 50+Women Tabs Take 1 tablet by mouth daily.   cephALEXin 500 MG capsule Commonly known as: KEFLEX Take 1 capsule (500 mg total) by mouth every 12 (twelve) hours.   citalopram 20 MG tablet Commonly known as: CELEXA TAKE 1 TABLET ONCE DAILY.   diclofenac Sodium 1 % Gel Commonly known as: Voltaren Apply 4 g topically 4 (four) times daily.   docusate sodium 100 MG capsule Commonly known as: COLACE Take 1 capsule (100 mg total) by mouth 2 (two) times daily.   enoxaparin 30 MG/0.3ML injection Commonly known as: LOVENOX Inject 0.3 mLs (30 mg total) into the skin daily.   feeding supplement (GLUCERNA SHAKE) Liqd Take 237 mLs by mouth 3 (three) times daily between meals.   fluticasone 50 MCG/ACT nasal spray Commonly known as: FLONASE Place 2 sprays into both nostrils daily.   HYDROcodone-acetaminophen 5-325 MG tablet Commonly known as: NORCO/VICODIN Take 1-2 tablets by mouth every 6 (six) hours as needed for up to 3 days for moderate pain.   levocetirizine 5 MG tablet Commonly known as: Xyzal Take 0.5 tablets (2.5 mg total) by mouth every evening.   LORazepam 0.5 MG tablet Commonly known as: ATIVAN Take 1 tablet (0.5 mg total) by mouth at bedtime.   lovastatin 20 MG tablet Commonly known as: MEVACOR TAKE ONE TABLET AT BEDTIME.   metoprolol tartrate 25 MG  tablet Commonly known as: LOPRESSOR Take 0.5 tablets (12.5 mg total) by mouth 2 (two) times daily. TAKE 1 TABLET BY MOUTH TWICE DAILY. What changed:   how much to take  additional instructions   montelukast 10 MG tablet Commonly known as: SINGULAIR Take 1 tablet (10 mg total) by mouth at bedtime.   mupirocin  ointment 2 % Commonly known as: BACTROBAN Apply 1 application topically 2 (two) times daily.   NovoLOG FlexPen 100 UNIT/ML FlexPen Generic drug: insulin aspart Inject 6 Units into the skin in the morning and at bedtime. Inject 6 units under the skin before breakfast and 8 units before supper.   Olopatadine HCl 0.2 % Soln Commonly known as: Pataday Apply 1 drop to each eye once daily.   omeprazole 40 MG capsule Commonly known as: PRILOSEC TAKE 1 CAPSULE (40 MG TOTAL) BY MOUTH DAILY.   oxybutynin 10 MG 24 hr tablet Commonly known as: Ditropan XL Take 1 tablet (10 mg total) by mouth at bedtime.   pregabalin 25 MG capsule Commonly known as: Lyrica Take 1 capsule (25 mg total) by mouth daily after supper.   traMADol 50 MG tablet Commonly known as: ULTRAM Take 1 tablet (50 mg total) by mouth daily. What changed: See the new instructions.   Tyler Aas FlexTouch 100 UNIT/ML FlexTouch Pen Generic drug: insulin degludec INJECT 20 UNITS SUBCUTANEOUSLY ONE TIME DAILY What changed: See the new instructions.            Discharge Care Instructions  (From admission, onward)         Start     Ordered   07/06/20 0000  Discharge wound care:       Comments: Reinforce dressing as needed   07/06/20 1031          Follow-up Information    McGowen, Adrian Blackwater, MD Follow up in 1 week(s).   Specialty: Family Medicine Contact information: [address] [place] [address] [phone]        Jerline Pain, MD .   Specialty: Cardiology Contact information: [phone] [place] [address] [phone]                Time  coordinating discharge: 35 min  Signed:  Geradine Girt DO  [facility] 07/06/2020, 8:30 AM

## 2020-07-06 NOTE — Care Management Important Message (Signed)
Important Message  Patient Details  Name: Vanessa Gibson MRN: [id number] Date of Birth: 08/22/1928   Medicare Important Message Given:  Yes     Levester Waldridge 07/06/2020, 1:18 PM

## 2020-07-11 DIAGNOSIS — E1122 Type 2 diabetes mellitus with diabetic chronic kidney disease: Secondary | ICD-10-CM | POA: Diagnosis not present

## 2020-07-11 DIAGNOSIS — F329 Major depressive disorder, single episode, unspecified: Secondary | ICD-10-CM | POA: Diagnosis not present

## 2020-07-11 DIAGNOSIS — S72002D Fracture of unspecified part of neck of left femur, subsequent encounter for closed fracture with routine healing: Secondary | ICD-10-CM | POA: Diagnosis not present

## 2020-07-15 ENCOUNTER — Encounter: Payer: Self-pay | Admitting: Family Medicine

## 2020-07-23 DIAGNOSIS — I10 Essential (primary) hypertension: Secondary | ICD-10-CM | POA: Diagnosis not present

## 2020-07-23 DIAGNOSIS — K219 Gastro-esophageal reflux disease without esophagitis: Secondary | ICD-10-CM | POA: Diagnosis not present

## 2020-07-23 DIAGNOSIS — J449 Chronic obstructive pulmonary disease, unspecified: Secondary | ICD-10-CM | POA: Diagnosis not present

## 2020-07-23 DIAGNOSIS — E1122 Type 2 diabetes mellitus with diabetic chronic kidney disease: Secondary | ICD-10-CM | POA: Diagnosis not present

## 2020-07-23 DIAGNOSIS — G4700 Insomnia, unspecified: Secondary | ICD-10-CM | POA: Diagnosis not present

## 2020-07-23 DIAGNOSIS — N183 Chronic kidney disease, stage 3 unspecified: Secondary | ICD-10-CM | POA: Diagnosis not present

## 2020-07-23 DIAGNOSIS — F329 Major depressive disorder, single episode, unspecified: Secondary | ICD-10-CM | POA: Diagnosis not present

## 2020-07-23 DIAGNOSIS — S72002D Fracture of unspecified part of neck of left femur, subsequent encounter for closed fracture with routine healing: Secondary | ICD-10-CM | POA: Diagnosis not present

## 2020-07-23 DIAGNOSIS — H353231 Exudative age-related macular degeneration, bilateral, with active choroidal neovascularization: Secondary | ICD-10-CM | POA: Diagnosis not present

## 2020-07-26 DIAGNOSIS — M6281 Muscle weakness (generalized): Secondary | ICD-10-CM | POA: Diagnosis not present

## 2020-07-26 DIAGNOSIS — R2681 Unsteadiness on feet: Secondary | ICD-10-CM | POA: Diagnosis not present

## 2020-07-26 DIAGNOSIS — R296 Repeated falls: Secondary | ICD-10-CM | POA: Diagnosis not present

## 2020-07-27 ENCOUNTER — Other Ambulatory Visit: Payer: Self-pay

## 2020-07-27 NOTE — Patient Outreach (Signed)
[facility]) Care Management  07/27/2020  Vanessa Gibson 15-Oct-1928 [id number]   Referral Date: 07/27/20 Referral Source: [employer] Report Referral Reason: Discharge [place] 07/25/20   Outreach Attempt: Spoke with patient who asked CM to call her son Vanessa Gibson. Telephone call to son Vanessa Gibson.  He states patient doing better back in her environment.  Discussed reason for call. Patient resides at [facility] with 24 hour paid care presently.  Patient also has therapy and pharmacy help through [facility].  He states patient is dependent on all aspects of care.  Discussed with son follow up with physicians.  He states he will be on the phone with them on today.  Patient has transportation that he will arrange as well.    Discussed [facility] services and follow up support.  Son declines needs right now.  CM contact information given for future reference.     Plan: RN CM will send letter and brochure. RN CM will close case.    Jone Baseman, RN, MSN [facility] Care Management Care Management Coordinator Direct Line [phone] Toll Free: [phone]  Fax: [phone]

## 2020-07-30 ENCOUNTER — Encounter: Payer: Self-pay | Admitting: Family Medicine

## 2020-07-31 ENCOUNTER — Telehealth: Payer: Self-pay

## 2020-07-31 DIAGNOSIS — R296 Repeated falls: Secondary | ICD-10-CM | POA: Diagnosis not present

## 2020-07-31 DIAGNOSIS — M6281 Muscle weakness (generalized): Secondary | ICD-10-CM | POA: Diagnosis not present

## 2020-07-31 DIAGNOSIS — Z4789 Encounter for other orthopedic aftercare: Secondary | ICD-10-CM | POA: Diagnosis not present

## 2020-07-31 DIAGNOSIS — R2681 Unsteadiness on feet: Secondary | ICD-10-CM | POA: Diagnosis not present

## 2020-07-31 NOTE — Telephone Encounter (Signed)
Patient son [name] is POA) called about requesting Dr. Anitra Lauth to order a shower seat and a walker. (the walker must be the one styled with the wheels at the front and the slide at the back.)  Please call [name] when completed.  [phone]

## 2020-07-31 NOTE — Telephone Encounter (Signed)
Patient's son called back he checked with [facility] to see who was in network for  DME [employer], [address] [place], [place]. He is requesting the walker that has the wheels on the front with the sliders on the back.

## 2020-07-31 NOTE — Telephone Encounter (Signed)
Pls send rx for this walker to appropriate place. Dx is unsteady gait and debilitated patient.-thx

## 2020-07-31 NOTE — Telephone Encounter (Signed)
Please advise 

## 2020-08-01 ENCOUNTER — Other Ambulatory Visit: Payer: Self-pay

## 2020-08-01 DIAGNOSIS — S7292XA Unspecified fracture of left femur, initial encounter for closed fracture: Secondary | ICD-10-CM | POA: Diagnosis not present

## 2020-08-01 DIAGNOSIS — Z1159 Encounter for screening for other viral diseases: Secondary | ICD-10-CM | POA: Diagnosis not present

## 2020-08-01 DIAGNOSIS — R2681 Unsteadiness on feet: Secondary | ICD-10-CM | POA: Diagnosis not present

## 2020-08-01 DIAGNOSIS — M6281 Muscle weakness (generalized): Secondary | ICD-10-CM | POA: Diagnosis not present

## 2020-08-01 DIAGNOSIS — R4189 Other symptoms and signs involving cognitive functions and awareness: Secondary | ICD-10-CM | POA: Diagnosis not present

## 2020-08-01 DIAGNOSIS — R296 Repeated falls: Secondary | ICD-10-CM | POA: Diagnosis not present

## 2020-08-01 DIAGNOSIS — R5381 Other malaise: Secondary | ICD-10-CM

## 2020-08-01 DIAGNOSIS — R2689 Other abnormalities of gait and mobility: Secondary | ICD-10-CM

## 2020-08-01 DIAGNOSIS — M1990 Unspecified osteoarthritis, unspecified site: Secondary | ICD-10-CM | POA: Diagnosis not present

## 2020-08-01 DIAGNOSIS — Z20828 Contact with and (suspected) exposure to other viral communicable diseases: Secondary | ICD-10-CM | POA: Diagnosis not present

## 2020-08-01 NOTE — Telephone Encounter (Signed)
Rx sent for walker

## 2020-08-02 DIAGNOSIS — R2681 Unsteadiness on feet: Secondary | ICD-10-CM | POA: Diagnosis not present

## 2020-08-02 DIAGNOSIS — R296 Repeated falls: Secondary | ICD-10-CM | POA: Diagnosis not present

## 2020-08-02 DIAGNOSIS — M6281 Muscle weakness (generalized): Secondary | ICD-10-CM | POA: Diagnosis not present

## 2020-08-03 ENCOUNTER — Other Ambulatory Visit: Payer: Self-pay

## 2020-08-03 DIAGNOSIS — R296 Repeated falls: Secondary | ICD-10-CM | POA: Diagnosis not present

## 2020-08-03 DIAGNOSIS — S72092D Other fracture of head and neck of left femur, subsequent encounter for closed fracture with routine healing: Secondary | ICD-10-CM | POA: Diagnosis not present

## 2020-08-03 DIAGNOSIS — R5381 Other malaise: Secondary | ICD-10-CM

## 2020-08-03 DIAGNOSIS — Z9181 History of falling: Secondary | ICD-10-CM

## 2020-08-03 DIAGNOSIS — M8949 Other hypertrophic osteoarthropathy, multiple sites: Secondary | ICD-10-CM | POA: Diagnosis not present

## 2020-08-03 DIAGNOSIS — R41841 Cognitive communication deficit: Secondary | ICD-10-CM | POA: Diagnosis not present

## 2020-08-03 DIAGNOSIS — R2689 Other abnormalities of gait and mobility: Secondary | ICD-10-CM

## 2020-08-03 DIAGNOSIS — M6281 Muscle weakness (generalized): Secondary | ICD-10-CM | POA: Diagnosis not present

## 2020-08-03 DIAGNOSIS — R2681 Unsteadiness on feet: Secondary | ICD-10-CM | POA: Diagnosis not present

## 2020-08-03 NOTE — Telephone Encounter (Signed)
Vanessa Gibson has not received the Rx for the walker and the shower seat. Please deliver to [facility], [address], [place], [place]. Please process ASAP as that patient has a broken hip.

## 2020-08-03 NOTE — Telephone Encounter (Signed)
Called son, [name], to speak with him about orders. Waiting on CB

## 2020-08-03 NOTE — Progress Notes (Signed)
Yes, but it would not surprise me if I am forced (by her insurer) to do a face to face visit to "confirm/prove" her need for this medical equipment.

## 2020-08-03 NOTE — Telephone Encounter (Signed)
Please contact patient's son when complete [name] [phone]

## 2020-08-06 ENCOUNTER — Telehealth: Payer: Self-pay

## 2020-08-06 DIAGNOSIS — R296 Repeated falls: Secondary | ICD-10-CM | POA: Diagnosis not present

## 2020-08-06 DIAGNOSIS — M6281 Muscle weakness (generalized): Secondary | ICD-10-CM | POA: Diagnosis not present

## 2020-08-06 NOTE — Telephone Encounter (Signed)
Received rehab physician orders for patient, Placed on PCP desk to review and sign, if appropriate.

## 2020-08-07 DIAGNOSIS — R2681 Unsteadiness on feet: Secondary | ICD-10-CM | POA: Diagnosis not present

## 2020-08-07 DIAGNOSIS — M6281 Muscle weakness (generalized): Secondary | ICD-10-CM | POA: Diagnosis not present

## 2020-08-07 DIAGNOSIS — R296 Repeated falls: Secondary | ICD-10-CM | POA: Diagnosis not present

## 2020-08-08 DIAGNOSIS — R2681 Unsteadiness on feet: Secondary | ICD-10-CM | POA: Diagnosis not present

## 2020-08-08 DIAGNOSIS — Z20828 Contact with and (suspected) exposure to other viral communicable diseases: Secondary | ICD-10-CM | POA: Diagnosis not present

## 2020-08-08 DIAGNOSIS — M6281 Muscle weakness (generalized): Secondary | ICD-10-CM | POA: Diagnosis not present

## 2020-08-08 DIAGNOSIS — R296 Repeated falls: Secondary | ICD-10-CM | POA: Diagnosis not present

## 2020-08-08 DIAGNOSIS — Z1159 Encounter for screening for other viral diseases: Secondary | ICD-10-CM | POA: Diagnosis not present

## 2020-08-09 DIAGNOSIS — M6281 Muscle weakness (generalized): Secondary | ICD-10-CM | POA: Diagnosis not present

## 2020-08-09 DIAGNOSIS — R488 Other symbolic dysfunctions: Secondary | ICD-10-CM | POA: Diagnosis not present

## 2020-08-09 DIAGNOSIS — R296 Repeated falls: Secondary | ICD-10-CM | POA: Diagnosis not present

## 2020-08-09 DIAGNOSIS — R2681 Unsteadiness on feet: Secondary | ICD-10-CM | POA: Diagnosis not present

## 2020-08-09 DIAGNOSIS — R41841 Cognitive communication deficit: Secondary | ICD-10-CM | POA: Diagnosis not present

## 2020-08-09 NOTE — Telephone Encounter (Addendum)
PT/ST plan of care received, Placed on PCP desk to review and sign, if appropriate.

## 2020-08-10 ENCOUNTER — Ambulatory Visit (INDEPENDENT_AMBULATORY_CARE_PROVIDER_SITE_OTHER): Payer: Medicare HMO | Admitting: Family Medicine

## 2020-08-10 ENCOUNTER — Other Ambulatory Visit: Payer: Self-pay

## 2020-08-10 ENCOUNTER — Encounter: Payer: Self-pay | Admitting: Family Medicine

## 2020-08-10 VITALS — BP 126/66 | HR 80 | Temp 97.6°F | Resp 16 | Wt 115.8 lb

## 2020-08-10 DIAGNOSIS — I10 Essential (primary) hypertension: Secondary | ICD-10-CM | POA: Diagnosis not present

## 2020-08-10 DIAGNOSIS — D62 Acute posthemorrhagic anemia: Secondary | ICD-10-CM

## 2020-08-10 DIAGNOSIS — Z23 Encounter for immunization: Secondary | ICD-10-CM

## 2020-08-10 DIAGNOSIS — E118 Type 2 diabetes mellitus with unspecified complications: Secondary | ICD-10-CM

## 2020-08-10 DIAGNOSIS — N183 Chronic kidney disease, stage 3 unspecified: Secondary | ICD-10-CM | POA: Diagnosis not present

## 2020-08-10 DIAGNOSIS — F0391 Unspecified dementia with behavioral disturbance: Secondary | ICD-10-CM | POA: Diagnosis not present

## 2020-08-10 DIAGNOSIS — R2681 Unsteadiness on feet: Secondary | ICD-10-CM | POA: Diagnosis not present

## 2020-08-10 DIAGNOSIS — M6281 Muscle weakness (generalized): Secondary | ICD-10-CM | POA: Diagnosis not present

## 2020-08-10 DIAGNOSIS — R296 Repeated falls: Secondary | ICD-10-CM | POA: Diagnosis not present

## 2020-08-10 DIAGNOSIS — Z8781 Personal history of (healed) traumatic fracture: Secondary | ICD-10-CM | POA: Diagnosis not present

## 2020-08-10 NOTE — Progress Notes (Addendum)
Office Note 08/10/2020  CC:  Chief Complaint  Patient presents with  . Follow-up    rehab discharge   HPI:  Vanessa Gibson is a 86 y.o. White female who is here accompanied by her daughter for f/u of hospitalization and rehab stay for right hip fracture.  I reviewed all records from hospitalization today. Admitted 8/2-8/13, 2021-->d/c'd to rehab, then d/c'd from rehab to [facility]. Not really sure when pt went home from rehab to [facility].  She had a mechanical fall 06/25/20, could not get up, deformity noted, found to have R femoral neck fracture that she subsequently underwent ORIF for.  She had some acute surgical blood loss anemia, hb down to 7.5.  Pt is moderately demented, has poor insight into this, daughter and pt do not know what meds she is taking, says a pharmacist came to her home and put her pills into a pillpack/dispenser and they don't have the bottles.    Pts daughter has a letter from her brother [name] who lives in [place]: it states that pt is doing pretty well as far as physical recovery, walking pretty good with minor pain. Says she needs assisted living.  Says she has difficulty sleeping at night, is sleepy/tired all day. Has nocturia, says she "needs something for this"---I did have her on ditropan xl and she felt this was helpful but I don't know if it is in her pillpack. He asks for a letter from me verifying her dementia diagnosis and stating she needs full assistance with bathing, toileting, and medication management. She apparently has a new walker arriving today.  A letter from [facility] to me states that "her MOCHA speech and language therapy scored 10/30.  Inc diff time caring for herself even prior to recent fall and hip fx. Says she cannot manage meds on her own, has a caregiver hired by son to stay in a.m only until 11:30 and another from 5pm to 11 pm.  She is ambulating with rolling walker, complaints of extreme fatigue and is  very fearful when she has no one with her.  She will walk out into hall and yell for help. She has increased incontinence, decreased bathing and dressing initiation, and she is isolating to her room and declining to go to the dining room and do activities with other residents, which is unusual for her.  Cognition has rapidly declined over the past 6 months".    Past Medical History:  Diagnosis Date  . Abnormal EKG    RBBB and LAFB  . Anxiety and depression   . Axillary adenopathy 01/2020   CT 12/2019 incidental finding, pt had recent covid vaccine in ipsilateral arm.  F/u axillary u/s 1 mo later showed dec in size of node.  F/u axillary u/s 3 mo recommended.  . Chronic renal insufficiency, stage 3 (moderate)    GFR $Re'@40'fTG$   . Chronic venous insufficiency   . Closed displaced fracture of right femoral neck ([place]) 06/2020  . Diabetes mellitus ([place])    managed by endo Dr. Dwyane Dee  . Dizziness   . DOE (dyspnea on exertion)    no desat, CT chest ok except mild small airways dz. Hiatal hernia.  DECONDITIONING  . Femoral neck fracture ([place]) 06/25/2020   Right: ORIF  . GERD (gastroesophageal reflux disease)   . Hard of hearing   . Hiatal hernia    Moderate size (contributing to pt's DOE?)  . History of bilateral breast cancer approx age 62  bilat mastectomy then tamoxifen x 10 yrs  . Hyperlipemia   . Hypertension   . IBS (irritable bowel syndrome)   . Mandibular fracture ([place]) 12/04/2015  . MI (myocardial infarction) ([place])   . Osteoarthritis, multiple sites   . Paresthesias    burning, hands and feet.  Gabapentin no help.  Dr. Dwyane Dee started her on lyrica 02/2020.  . Skin cancer of face   . Ventral hernia "many years"   supraumbilical    Past Surgical History:  Procedure Laterality Date  . BREAST SURGERY    . Cardiac event monitor  2016   Normal  . CATARACT EXTRACTION W/ INTRAOCULAR LENS  IMPLANT, BILATERAL Bilateral   . CHOLECYSTECTOMY OPEN  1970's  . FEMUR IM NAIL Right 06/26/2020    Procedure: INTERMEDULLARY FEMORAL AFFIXUS NAIL;  Surgeon: Altamese [place], MD;  Location: [facility];  Service: Orthopedics;  Laterality: Right;  . MASTECTOMY Bilateral   . PFTs  04/05/2018   Interpretation: The FVC, FEV1, FEV1/FVC ratio and FEF25-75% are within normal limits. Patient effort was erratic, but mild dec in diff capacity noted--mild dec functional alveolar tissue  . TRANSTHORACIC ECHOCARDIOGRAM  11/09/2019   EF 60-65%, grd I DD.    No family history on file.  Social History   Socioeconomic History  . Marital status: Divorced    Spouse name: Not on file  . Number of children: Not on file  . Years of education: Not on file  . Highest education level: Not on file  Occupational History  . Not on file  Tobacco Use  . Smoking status: Never Smoker  . Smokeless tobacco: Never Used  Vaping Use  . Vaping Use: Never used  Substance and Sexual Activity  . Alcohol use: No  . Drug use: No  . Sexual activity: Never  Other Topics Concern  . Not on file  Social History Narrative   Widow, lives alone.  Has 3 kids.    Moved to [place] from [place] in 2016 to be near her daughter.  Orig from [place], has been in [place] since late teens.   Homemaker.   Never smoker.  No alcohol.   Lives at [facility], [place].    Social Determinants of Health   Financial Resource Strain:   . Difficulty of Paying Living Expenses: Not on file  Food Insecurity:   . Worried About Charity fundraiser in the Last Year: Not on file  . Ran Out of Food in the Last Year: Not on file  Transportation Needs:   . Lack of Transportation (Medical): Not on file  . Lack of Transportation (Non-Medical): Not on file  Physical Activity:   . Days of Exercise per Week: Not on file  . Minutes of Exercise per Session: Not on file  Stress:   . Feeling of Stress : Not on file  Social Connections:   . Frequency of Communication with Friends and Family: Not on file  . Frequency of Social Gatherings with Friends and  Family: Not on file  . Attends Religious Services: Not on file  . Active Member of Clubs or Organizations: Not on file  . Attends Archivist Meetings: Not on file  . Marital Status: Not on file  Intimate Partner Violence:   . Fear of Current or Ex-Partner: Not on file  . Emotionally Abused: Not on file  . Physically Abused: Not on file  . Sexually Abused: Not on file    Outpatient Medications Prior to Visit  Medication  Sig Dispense Refill  . ACCU-CHEK GUIDE test strip CHECK BLOOD SUGAR THREE TO FOUR TIMES DAILY (Patient taking differently: 1 each by Other route in the morning, at noon, in the evening, and at bedtime. ) 400 strip 2  . Accu-Chek Softclix Lancets lancets Use as instructed to check blood sugar 3-4 times daily. (Patient taking differently: 1 each by Other route See admin instructions. Use as instructed to check blood sugar 3-4 times daily.) 400 each 2  . Alcohol Swabs (B-D SINGLE USE SWABS REGULAR) PADS USE TO CLEAN FINGER BEFORE CHECKING BLOOD SUGARS AND CLEAN SKIN BEFORE INSULIN INJECTIONS. (Patient taking differently: 1 each by Other route See admin instructions. Use to clean finger before checking blood sugars and clean skin before insulin injections.) 300 each 3  . amLODipine (NORVASC) 5 MG tablet Take 1 tablet (5 mg total) by mouth daily. 90 tablet 3  . aspirin EC 81 MG tablet Take 81 mg by mouth daily.    . BD PEN NEEDLE NANO U/F 32G X 4 MM MISC USE TO INJECT INSULINS 4 TIMES DAILY. (Patient taking differently: 1 each by Other route in the morning, at noon, in the evening, and at bedtime. ) 200 each 11  . Blood Glucose Monitoring Suppl (ACCU-CHEK GUIDE) w/Device KIT Use Accu Chek Guide meter to check blood sugar 3-4 times daily. (Patient taking differently: 1 each by Other route See admin instructions. Use Accu Chek Guide meter to check blood sugar 3-4 times daily. ) 1 kit 0  . calcium-vitamin D (OSCAL WITH D) 500-200 MG-UNIT tablet Take 1 tablet by mouth daily with  breakfast.    . cephALEXin (KEFLEX) 500 MG capsule Take 1 capsule (500 mg total) by mouth every 12 (twelve) hours.    . citalopram (CELEXA) 20 MG tablet TAKE 1 TABLET ONCE DAILY. (Patient taking differently: Take 20 mg by mouth daily. ) 30 tablet 0  . diclofenac Sodium (VOLTAREN) 1 % GEL Apply 4 g topically 4 (four) times daily. 100 g 3  . docusate sodium (COLACE) 100 MG capsule Take 1 capsule (100 mg total) by mouth 2 (two) times daily. 10 capsule 0  . enoxaparin (LOVENOX) 30 MG/0.3ML injection Inject 0.3 mLs (30 mg total) into the skin daily. 0 mL   . feeding supplement, GLUCERNA SHAKE, (GLUCERNA SHAKE) LIQD Take 237 mLs by mouth 3 (three) times daily between meals.  0  . fluticasone (FLONASE) 50 MCG/ACT nasal spray Place 2 sprays into both nostrils daily. 16 g 0  . insulin aspart (NOVOLOG FLEXPEN) 100 UNIT/ML FlexPen Inject 6 Units into the skin in the morning and at bedtime. Inject 6 units under the skin before breakfast and 8 units before supper.     . levocetirizine (XYZAL) 5 MG tablet Take 0.5 tablets (2.5 mg total) by mouth every evening. 45 tablet 3  . LORazepam (ATIVAN) 0.5 MG tablet Take 1 tablet (0.5 mg total) by mouth at bedtime. 8 tablet 0  . lovastatin (MEVACOR) 20 MG tablet TAKE ONE TABLET AT BEDTIME. (Patient taking differently: Take 20 mg by mouth at bedtime. ) 90 tablet 0  . metoprolol tartrate (LOPRESSOR) 25 MG tablet Take 0.5 tablets (12.5 mg total) by mouth 2 (two) times daily. TAKE 1 TABLET BY MOUTH TWICE DAILY. (Patient taking differently: Take 25 mg by mouth 2 (two) times daily. ) 180 tablet 3  . montelukast (SINGULAIR) 10 MG tablet Take 1 tablet (10 mg total) by mouth at bedtime. 90 tablet 3  . Multiple Vitamins-Minerals (CENTRUM SILVER 50+WOMEN) TABS  Take 1 tablet by mouth daily.    . mupirocin ointment (BACTROBAN) 2 % Apply 1 application topically 2 (two) times daily.     . Olopatadine HCl (PATADAY) 0.2 % SOLN Apply 1 drop to each eye once daily. 2.5 mL 0  . omeprazole  (PRILOSEC) 40 MG capsule TAKE 1 CAPSULE (40 MG TOTAL) BY MOUTH DAILY. 90 capsule 0  . oxybutynin (DITROPAN XL) 10 MG 24 hr tablet Take 1 tablet (10 mg total) by mouth at bedtime. 30 tablet 1  . pregabalin (LYRICA) 25 MG capsule Take 1 capsule (25 mg total) by mouth daily after supper. 30 capsule 1  . traMADol (ULTRAM) 50 MG tablet Take 1 tablet (50 mg total) by mouth daily. 4 tablet 0  . TRESIBA FLEXTOUCH 100 UNIT/ML SOPN FlexTouch Pen INJECT 20 UNITS SUBCUTANEOUSLY ONE TIME DAILY (Patient taking differently: Inject 16 Units into the skin daily. ) 30 mL 3   No facility-administered medications prior to visit.    No Known Allergies  ROS Review of Systems  Constitutional: Positive for fatigue. Negative for appetite change, chills and fever.  HENT: Negative for congestion, dental problem, ear pain and sore throat.   Eyes: Negative for discharge, redness and visual disturbance.  Respiratory: Negative for cough, chest tightness, shortness of breath and wheezing.   Cardiovascular: Negative for chest pain, palpitations and leg swelling.  Gastrointestinal: Negative for abdominal pain, blood in stool, diarrhea, nausea and vomiting.  Genitourinary: Negative for difficulty urinating, dysuria, flank pain, frequency, hematuria and urgency.       Chronic urinary urgency/frequency/nocturia  Musculoskeletal: Negative for arthralgias (mild right hip), back pain, joint swelling, myalgias and neck stiffness.  Skin: Negative for pallor and rash.  Neurological: Negative for dizziness, speech difficulty, weakness and headaches.  Hematological: Negative for adenopathy. Does not bruise/bleed easily.  Psychiatric/Behavioral: Positive for confusion, decreased concentration, dysphoric mood and sleep disturbance. The patient is not nervous/anxious.        Poor memory    PE; Vitals with BMI 08/10/2020 07/06/2020 07/06/2020  Height - - -  Weight 115 lbs 13 oz - -  BMI - - -  Systolic 450 388 828  Diastolic 66 67  62  Pulse 80 88 80    Gen: alert, attends well.  Oriented to person, general place, and general situation. AFFECT: pleasant, lucid thought, repeated herself occasionally MKL:[id number]: no injection, icteris, swelling, or exudate.  EOMI, PERRLA. Mouth: lips without lesion/swelling.  Oral mucosa pink and moist. Oropharynx without erythema, exudate, or swelling.  CV: RRR, no m/r/g.   LUNGS: CTA bilat, nonlabored resps, good aeration in all lung fields. ABD: soft, NT, ND, BS normal.  No hepatospenomegaly or mass.  No bruits. EXT: no clubbing or cyanosis.  No pitting edema.  No tenderness to palpation of R hip. Flexes R hip a little bit from the sitting position.  Extends R and L LL's fine.  Flexes L hip fine.  MMSE:  Unable to fully complete this today b/c of communication difficulty (pt notably HOH) and slowness of her responses, but essentially she has deficits in focus, recall, and concentration.  She wrote a coherent sentence and she followed a 3 step command.  Pertinent labs:   Lab Results  Component Value Date   HGBA1C 6.4 (A) 06/04/2020    Lab Results  Component Value Date   TSH 1.54 05/07/2020   Lab Results  Component Value Date   WBC 12.6 (H) 07/04/2020   HGB 7.4 (L) 07/04/2020   HCT 22.8 (  L) 07/04/2020   MCV 94.6 07/04/2020   PLT 398 07/04/2020   Lab Results  Component Value Date   IRON 89 05/07/2020   TIBC 306 05/07/2020   FERRITIN 38 05/07/2020   Lab Results  Component Value Date   VITAMINB12 616 05/23/2019    Lab Results  Component Value Date   CREATININE 0.89 07/04/2020   BUN 11 07/04/2020   NA 128 (L) 07/04/2020   K 4.1 07/04/2020   CL 96 (L) 07/04/2020   CO2 24 07/04/2020   Lab Results  Component Value Date   ALT 19 06/25/2020   AST 29 06/25/2020   ALKPHOS 57 06/25/2020   BILITOT 0.6 06/25/2020   Lab Results  Component Value Date   CHOL 158 08/24/2019   Lab Results  Component Value Date   HDL 50.50 08/24/2019   Lab Results  Component  Value Date   LDLCALC 77 08/24/2019   Lab Results  Component Value Date   TRIG 153.0 (H) 08/24/2019   Lab Results  Component Value Date   CHOLHDL 3 08/24/2019    ASSESSMENT AND PLAN:   Hosp f/u:  1) R hip fx->ORIF->rehab->now at [facility] independent living facility getting PT/OT.  She does need ALF care. Unfortunately, there was lack of any good info today regarding her current meds, so we'll need to contact the pharmacist who did her pillpack and get these details. I would like to get her off any meds that would promote imbalance/falls ---she currently has tramadol and lorazepam on her list.  She also has keflex and lovenox on her list from hosp but neither the patient or the daughter have any knowledge of her actually being on either of these.  Unknown if taking aspirin.  2) Acute operative blood loss anemia: Hb nadir 7.5. No transfusion needed as far as I can tell from hosp records and pt/daughter. Unknown if sent home on iron or not. Recheck CBC today.  3) HTN in the context of CRI III:  bp good today.  Will get med info as stated above. BMET today.  4) DM 2, control historically good, followed by Dr. Dwyane Dee. Again, we'll confirm meds and she'll need to get back to see Dr. Dwyane Dee at some point.  5) Hyponatremia: mention of SIADH in hosp records.  Na+128 on 8/21. BMET today.  6) Dementia: she needs 100% assistance with all ADL's per daughter and son's report. Will need to call son.  Spent 45 min with pt today reviewing HPI, reviewing relevant past history, doing exam, reviewing and discussing lab and imaging data, and formulating plans.  An After Visit Summary was printed and given to the patient.  FOLLOW UP:  1 wk  Signed:  Crissie Sickles, MD           08/10/2020  ADDENDUM 08/14/20 at 2:40 pm  Confirmed with Kevan Ny, pt's pharmacist who puts her pillpacks together, that she has the following meds in her pill pack----> Prilosec 40mg  qam Oxybutynin xl 10mg   qhs lyrica 25mg  q evening Tramadol 50mg  qAM Colace 100 mg qd Amlodipine 5mg  qd ASA 81mg  qd Citalopram 20mg  qd flonase qd Novolog flex pen 6 U qAM and 6 U q 9PM as well as 6 U w/BF  and 8 U w/ supper Tresiba: 20 U qd xyzal 5mg , 1/2 tab qhs Lorazepam 0.5mg , 1 qhs Lovastatin 20mg  qhs Lopressor 12.5mg  bid Singulair 10mg  qhs, MVI qd Olapatadine 0.2% 1 drop each eye  Will update med list. Signed:  Crissie Sickles, MD  08/14/2020   

## 2020-08-11 LAB — BASIC METABOLIC PANEL
BUN/Creatinine Ratio: 28 (calc) — ABNORMAL HIGH (ref 6–22)
BUN: 29 mg/dL — ABNORMAL HIGH (ref 7–25)
CO2: 25 mmol/L (ref 20–32)
Calcium: 9.2 mg/dL (ref 8.6–10.4)
Chloride: 97 mmol/L — ABNORMAL LOW (ref 98–110)
Creat: 1.05 mg/dL — ABNORMAL HIGH (ref 0.60–0.88)
Glucose, Bld: 89 mg/dL (ref 65–99)
Potassium: 4.8 mmol/L (ref 3.5–5.3)
Sodium: 133 mmol/L — ABNORMAL LOW (ref 135–146)

## 2020-08-11 LAB — CBC WITH DIFFERENTIAL/PLATELET
Absolute Monocytes: 941 cells/uL (ref 200–950)
Basophils Absolute: 48 cells/uL (ref 0–200)
Basophils Relative: 0.5 %
Eosinophils Absolute: 190 cells/uL (ref 15–500)
Eosinophils Relative: 2 %
HCT: 33.5 % — ABNORMAL LOW (ref 35.0–45.0)
Hemoglobin: 11 g/dL — ABNORMAL LOW (ref 11.7–15.5)
Lymphs Abs: 1587 cells/uL (ref 850–3900)
MCH: 31 pg (ref 27.0–33.0)
MCHC: 32.8 g/dL (ref 32.0–36.0)
MCV: 94.4 fL (ref 80.0–100.0)
MPV: 10.1 fL (ref 7.5–12.5)
Monocytes Relative: 9.9 %
Neutro Abs: 6736 cells/uL (ref 1500–7800)
Neutrophils Relative %: 70.9 %
Platelets: 384 10*3/uL (ref 140–400)
RBC: 3.55 10*6/uL — ABNORMAL LOW (ref 3.80–5.10)
RDW: 12.6 % (ref 11.0–15.0)
Total Lymphocyte: 16.7 %
WBC: 9.5 10*3/uL (ref 3.8–10.8)

## 2020-08-13 DIAGNOSIS — R296 Repeated falls: Secondary | ICD-10-CM | POA: Diagnosis not present

## 2020-08-13 DIAGNOSIS — R2681 Unsteadiness on feet: Secondary | ICD-10-CM | POA: Diagnosis not present

## 2020-08-13 DIAGNOSIS — M6281 Muscle weakness (generalized): Secondary | ICD-10-CM | POA: Diagnosis not present

## 2020-08-14 DIAGNOSIS — Z20828 Contact with and (suspected) exposure to other viral communicable diseases: Secondary | ICD-10-CM | POA: Diagnosis not present

## 2020-08-14 DIAGNOSIS — R41841 Cognitive communication deficit: Secondary | ICD-10-CM | POA: Diagnosis not present

## 2020-08-14 DIAGNOSIS — Z1159 Encounter for screening for other viral diseases: Secondary | ICD-10-CM | POA: Diagnosis not present

## 2020-08-14 DIAGNOSIS — R296 Repeated falls: Secondary | ICD-10-CM | POA: Diagnosis not present

## 2020-08-14 DIAGNOSIS — M6281 Muscle weakness (generalized): Secondary | ICD-10-CM | POA: Diagnosis not present

## 2020-08-14 DIAGNOSIS — R488 Other symbolic dysfunctions: Secondary | ICD-10-CM | POA: Diagnosis not present

## 2020-08-14 NOTE — Addendum Note (Signed)
Addended by: Tammi Sou on: 08/14/2020 02:53 PM   Modules accepted: Orders

## 2020-08-15 ENCOUNTER — Other Ambulatory Visit: Payer: Self-pay | Admitting: Family Medicine

## 2020-08-15 ENCOUNTER — Other Ambulatory Visit: Payer: Self-pay | Admitting: Endocrinology

## 2020-08-15 ENCOUNTER — Encounter (INDEPENDENT_AMBULATORY_CARE_PROVIDER_SITE_OTHER): Payer: Medicare HMO | Admitting: Ophthalmology

## 2020-08-15 DIAGNOSIS — G4700 Insomnia, unspecified: Secondary | ICD-10-CM

## 2020-08-15 DIAGNOSIS — R2681 Unsteadiness on feet: Secondary | ICD-10-CM | POA: Diagnosis not present

## 2020-08-15 DIAGNOSIS — K219 Gastro-esophageal reflux disease without esophagitis: Secondary | ICD-10-CM

## 2020-08-15 DIAGNOSIS — M6281 Muscle weakness (generalized): Secondary | ICD-10-CM | POA: Diagnosis not present

## 2020-08-15 DIAGNOSIS — J3089 Other allergic rhinitis: Secondary | ICD-10-CM

## 2020-08-15 DIAGNOSIS — R296 Repeated falls: Secondary | ICD-10-CM | POA: Diagnosis not present

## 2020-08-16 ENCOUNTER — Other Ambulatory Visit: Payer: Self-pay | Admitting: Family Medicine

## 2020-08-16 DIAGNOSIS — G4700 Insomnia, unspecified: Secondary | ICD-10-CM

## 2020-08-16 DIAGNOSIS — J3089 Other allergic rhinitis: Secondary | ICD-10-CM

## 2020-08-16 DIAGNOSIS — R41841 Cognitive communication deficit: Secondary | ICD-10-CM | POA: Diagnosis not present

## 2020-08-16 DIAGNOSIS — R488 Other symbolic dysfunctions: Secondary | ICD-10-CM | POA: Diagnosis not present

## 2020-08-16 DIAGNOSIS — R2681 Unsteadiness on feet: Secondary | ICD-10-CM | POA: Diagnosis not present

## 2020-08-16 DIAGNOSIS — M6281 Muscle weakness (generalized): Secondary | ICD-10-CM | POA: Diagnosis not present

## 2020-08-16 DIAGNOSIS — R296 Repeated falls: Secondary | ICD-10-CM | POA: Diagnosis not present

## 2020-08-16 MED ORDER — LORAZEPAM 0.5 MG PO TABS: 0.5000 mg | ORAL_TABLET | Freq: Every day | ORAL | 1 refills | Status: DC

## 2020-08-16 NOTE — Telephone Encounter (Signed)
RF request for citalopram LOV:08/10/20 Next ov: 08/17/20 Last written:06/21/20(30,0)   Requesting:tramadol Contract:n/a UDS: n/a Last Visit:08/10/20 Next Visit:08/17/20 Last Refill:07/06/20 (4,0) prescribed by Dr.Vann  Requesting: lorazepam Contract:n/a UDS:n/a Last Visit:08/10/20 Next Visit:08/17/20 Last Refill:07/06/20(8,0) prescribed by Dr.Vann  Please Advise. Medications pending

## 2020-08-16 NOTE — Telephone Encounter (Signed)
RF request for Xyzal LOV:08/10/20 Next ov: 08/17/20 Last written:08/18/19(45,3)   Requesting: Lyrica Contract:n/a UDS:n/a Last Visit:08/10/20 Next Visit:08/17/20 Last Refill:03/05/20(30,1)  Please Advise. Medications pending

## 2020-08-17 ENCOUNTER — Ambulatory Visit (INDEPENDENT_AMBULATORY_CARE_PROVIDER_SITE_OTHER): Payer: Medicare HMO | Admitting: Family Medicine

## 2020-08-17 ENCOUNTER — Other Ambulatory Visit: Payer: Self-pay

## 2020-08-17 ENCOUNTER — Encounter: Payer: Self-pay | Admitting: Family Medicine

## 2020-08-17 VITALS — BP 131/72 | HR 80 | Temp 98.4°F | Resp 18 | Ht 59.0 in | Wt 117.5 lb

## 2020-08-17 DIAGNOSIS — K5909 Other constipation: Secondary | ICD-10-CM | POA: Diagnosis not present

## 2020-08-17 DIAGNOSIS — Z8781 Personal history of (healed) traumatic fracture: Secondary | ICD-10-CM

## 2020-08-17 DIAGNOSIS — Z789 Other specified health status: Secondary | ICD-10-CM | POA: Diagnosis not present

## 2020-08-17 DIAGNOSIS — Z20828 Contact with and (suspected) exposure to other viral communicable diseases: Secondary | ICD-10-CM | POA: Diagnosis not present

## 2020-08-17 DIAGNOSIS — R488 Other symbolic dysfunctions: Secondary | ICD-10-CM | POA: Diagnosis not present

## 2020-08-17 DIAGNOSIS — K5900 Constipation, unspecified: Secondary | ICD-10-CM | POA: Diagnosis not present

## 2020-08-17 DIAGNOSIS — E1149 Type 2 diabetes mellitus with other diabetic neurological complication: Secondary | ICD-10-CM

## 2020-08-17 DIAGNOSIS — N183 Chronic kidney disease, stage 3 unspecified: Secondary | ICD-10-CM | POA: Diagnosis not present

## 2020-08-17 DIAGNOSIS — R296 Repeated falls: Secondary | ICD-10-CM | POA: Diagnosis not present

## 2020-08-17 DIAGNOSIS — R41841 Cognitive communication deficit: Secondary | ICD-10-CM | POA: Diagnosis not present

## 2020-08-17 DIAGNOSIS — Z7409 Other reduced mobility: Secondary | ICD-10-CM

## 2020-08-17 DIAGNOSIS — M6281 Muscle weakness (generalized): Secondary | ICD-10-CM | POA: Diagnosis not present

## 2020-08-17 DIAGNOSIS — F0390 Unspecified dementia without behavioral disturbance: Secondary | ICD-10-CM | POA: Diagnosis not present

## 2020-08-17 DIAGNOSIS — Z1159 Encounter for screening for other viral diseases: Secondary | ICD-10-CM | POA: Diagnosis not present

## 2020-08-17 MED ORDER — TRESIBA FLEXTOUCH 100 UNIT/ML ~~LOC~~ SOPN: PEN_INJECTOR | SUBCUTANEOUS | 3 refills | Status: DC

## 2020-08-17 MED ORDER — NOVOLOG FLEXPEN 100 UNIT/ML ~~LOC~~ SOPN: PEN_INJECTOR | SUBCUTANEOUS | 0 refills | Status: DC

## 2020-08-17 NOTE — Progress Notes (Signed)
OFFICE VISIT  08/17/2020  CC:  Chief Complaint  Patient presents with  . Follow-up    1 week    HPI:    Patient is a 86 y.o. Caucasian female who presents accompanied by her daughter [name] for 1 wk f/u R hip fracture and surgery and dementia with progressive decline in cognition and ability to care for herself. Since last visit I have clarified her medication list and it is all appropriate.  I also spoke with her son [name] and discussed her general situation and the need to transition her to ALF. CBC last visit showed that her Hb had risen to 11, which is appropriate.  Renal function was at her baseline of about 40 ml/min.  Na 133.  Glucose was 89.  INTERIM HX:  Still with some nocturia but daytime OAB sx's essentially absent.  Ditropan 77m xl has been signif helpful but she wishes she could have higher dose.  No burning with urination.    No BM in last 4 d, pebbles that are very dark brown.  Suppository yesterday no help.  Hx of miralax use in the past and it helped.  Does not feel full in lower abdomen.  She does take tramadol every night but says she is not really in any pain anymore.    Lots of anxiety, particularly when alone.  No HA.   Says she sees "figures" and "people I don't know".  [place]in [place] and jew[place].  She denies hearing any voices talking to her.  She does say a ghost pushed her over and that's what made her fall when she ended up breaking her hip. She is having the visual hallucinations less and less.  This happens only when she is alone. These initially started [place]  Past Medical History:  Diagnosis Date  . Abnormal EKG    RBBB and LAFB  . Anxiety and depression   . Axillary adenopathy 01/2020   CT 12/2019 incidental finding, pt had recent covid vaccine in ipsilateral arm.  F/u axillary u/s 1 mo later showed dec in size of node.  F/u axillary u/s 3 mo recommended.  .  Chronic renal insufficiency, stage 3 (moderate)    GFR @40   . Chronic venous insufficiency   . Closed displaced fracture of right femoral neck ([facility] 06[place]  . Diabetes mellitus ([place]    managed by endo Dr. KDwyane Dee . Dizziness   . DOE (dyspnea on exertion)    no desat, CT chest ok except mild small airways dz. Hiatal hernia.  DECONDITIONING  . Femoral neck fracture ([facility] 06/25/2020   Right: ORIF  . GERD (gastroesophageal reflux disease)   . Hard of hearing   . Hiatal hernia    Moderate size (contributing to pt's DOE?)  . History of bilateral breast cancer approx age 89  bilat mastectomy then tamoxifen x 10 yrs  . Hyperlipemia   . Hypertension   . IBS (irritable bowel syndrome)   . Mandibular fracture ([facility] 12/04/2015  . MI ([place]ocardial infarction) ([facility]   . Osteoarthritis, multiple sites   . Paresthesias    burning, hands and feet.  Gabapentin no help.  Dr. KDwyane Deestarted her on lyrica 02/2020.  . Skin cancer of face   . Ventral hernia "many years"   supraumbilical    Past Surgical History:  Procedure Laterality Date  . BREAST SURGERY    . Cardiac event monitor  2016   Normal  . CATARACT EXTRACTION W/ INTRAOCULAR LENS  IMPLANT, BILATERAL Bilateral   . CHOLECYSTECTOMY OPEN  1970's  . FEMUR IM NAIL Right 06/26/2020   Procedure: INTERMEDULLARY FEMORAL AFFIXUS NAIL;  Surgeon: Altamese [place]uff, MD;  Location: [facility];  Service: Orthopedics;  Laterality: Right;  . MASTECTOMY Bilateral   . PFTs  04/05/2018   Interpretation: The FVC, FEV1, FEV1/FVC ratio and FEF25-75% are within normal limits. Patient effort was erratic, but mild dec in diff capacity noted--mild dec functional alveolar tissue  . TRANSTHORACIC ECHOCARDIOGRAM  11/09/2019   EF 60-65%, grd I DD.    Outpatient Medications Prior to Visit  Medication Sig Dispense Refill  . ACCU-CHEK GUIDE test strip CHECK BLOOD SUGAR THREE TO FOUR TIMES DAILY 400 strip 2  . Accu-Chek Softclix Lancets lancets Use as instructed to check blood  sugar 3-4 times daily. (Patient taking differently: 1 each by Other route See admin instructions. Use as instructed to check blood sugar 3-4 times daily.) 400 each 2  . Alcohol Swabs (B-D SINGLE USE SWABS REGULAR) PADS USE TO CLEAN FINGER BEFORE CHECKING BLOOD SUGARS AND CLEAN SKIN BEFORE INSULIN INJECTIONS. (Patient taking differently: 1 each by Other route See admin instructions. Use to clean finger before checking blood sugars and clean skin before insulin injections.) 300 each 3  . amLODipine (NORVASC) 5 MG tablet Take 1 tablet (5 mg total) by mouth daily. 90 tablet 3  . aspirin EC 81 MG tablet Take 81 mg by mouth daily.    . Blood Glucose Monitoring Suppl (ACCU-CHEK GUIDE) w/Device KIT Use Accu Chek Guide meter to check blood sugar 3-4 times daily. (Patient taking differently: 1 each by Other route See admin instructions. Use Accu Chek Guide meter to check blood sugar 3-4 times daily. ) 1 kit 0  . citalopram (CELEXA) 20 MG tablet TAKE 1 TABLET ONCE DAILY. 90 tablet 3  . docusate sodium (COLACE) 100 MG capsule Take 1 capsule (100 mg total) by mouth 2 (two) times daily. 10 capsule 0  . DROPLET PEN NEEDLES 32G X 4 MM MISC USE  TO  INJECT  INSULIN EVERY DAY 100 each 1  . feeding supplement, GLUCERNA SHAKE, (GLUCERNA SHAKE) LIQD Take 237 mLs by mouth 3 (three) times daily between meals.  0  . fluticasone (FLONASE) 50 MCG/ACT nasal spray Place 2 sprays into both nostrils daily. 16 g 0  . levocetirizine (XYZAL) 5 MG tablet TAKE 1/2 TABLET IN THE EVENING. 45 tablet 3  . LORazepam (ATIVAN) 0.5 MG tablet Take 1 tablet (0.5 mg total) by mouth at bedtime. 90 tablet 1  . lovastatin (MEVACOR) 20 MG tablet TAKE ONE TABLET AT BEDTIME. (Patient taking differently: Take 20 mg by mouth at bedtime. ) 90 tablet 0  . metoprolol tartrate (LOPRESSOR) 25 MG tablet Take 0.5 tablets (12.5 mg total) by mouth 2 (two) times daily. TAKE 1 TABLET BY MOUTH TWICE DAILY. (Patient taking differently: Take 25 mg by mouth 2 (two) times  daily. ) 180 tablet 3  . montelukast (SINGULAIR) 10 MG tablet Take 1 tablet (10 mg total) by mouth at bedtime. 90 tablet 3  . Multiple Vitamins-Minerals (CENTRUM SILVER 50+WOMEN) TABS Take 1 tablet by mouth daily.    . Olopatadine HCl (PATADAY) 0.2 % SOLN Apply 1 drop to each eye once daily. 2.5 mL 0  . omeprazole (PRILOSEC) 40 MG capsule TAKE 1 CAPSULE EVERY DAY 90 capsule 0  . oxybutynin (DITROPAN XL) 10 MG 24 hr tablet Take 1 tablet (10 mg  total) by mouth at bedtime. 30 tablet 1  . pregabalin (LYRICA) 25 MG capsule TAKE 1 CAPSULE DAILY AFTER SUPPER. 90 capsule 1  . NOVOLOG FLEXPEN 100 UNIT/ML FlexPen INJECT 3 TO 4 UNITS SUBCUTANEOUSLY TWICE DAILY (EACH PEN EXPIRES 28 DAYS AFTER OPENING) 15 mL 0  . traMADol (ULTRAM) 50 MG tablet TAKE (1) TABLET EVERY EIGHT HOURS AS NEEDED FOR PAIN. 90 tablet 1  . TRESIBA FLEXTOUCH 100 UNIT/ML FlexTouch Pen INJECT 20 UNITS SUBCUTANEOUSLY ONE TIME DAILY 30 mL 3   No facility-administered medications prior to visit.    No Known Allergies  ROS As per HPI  PE: Vitals with BMI 08/17/2020 08/10/2020 07/06/2020  Height 4' 11"  - -  Weight 117 lbs 8 oz 115 lbs 13 oz -  BMI 65.68 - -  Systolic 127 517 001  Diastolic 72 66 67  Pulse 80 80 88     Gen: Alert, tired but well appearing.  Patient is oriented to person, place, general time, and general situation. VCB:SWHQ: no injection, icteris, swelling, or exudate.  EOMI, PERRLA. Mouth: lips without lesion/swelling.  Oral mucosa pink and dry. Oropharynx without erythema, exudate, or swelling.  CV: RRR, no m/r/g.   LUNGS: CTA bilat, nonlabored resps, good aeration in all lung fields. ABD: soft, NT/ND EXT: no clubbing or cyanosis.  no edema. Compression stockings on bilat. Neuro: CN 2-12 intact bilaterally, strength 5/5 in proximal and distal upper extremities and lower extremities bilaterally.  No sensory deficits.  No tremor.  No disdiadochokinesis.  No ataxia. No pronator drift.  Downgoing babinsky bilat.  Walks  steadily with walker.  No shuffling or veering or limp.   LABS:  Lab Results  Component Value Date   TSH 1.54 05/07/2020   Lab Results  Component Value Date   VITAMINB12 616 05/23/2019    Lab Results  Component Value Date   WBC 9.5 08/10/2020   HGB 11.0 (L) 08/10/2020   HCT 33.5 (L) 08/10/2020   MCV 94.4 08/10/2020   PLT 384 08/10/2020   Lab Results  Component Value Date   IRON 89 05/07/2020   TIBC 306 05/07/2020   FERRITIN 38 05/07/2020    Lab Results  Component Value Date   CREATININE 1.05 (H) 08/10/2020   BUN 29 (H) 08/10/2020   NA 133 (L) 08/10/2020   K 4.8 08/10/2020   CL 97 (L) 08/10/2020   CO2 25 08/10/2020   Lab Results  Component Value Date   ALT 19 06/25/2020   AST 29 06/25/2020   ALKPHOS 57 06/25/2020   BILITOT 0.6 06/25/2020   Lab Results  Component Value Date   CHOL 158 08/24/2019   Lab Results  Component Value Date   HDL 50.50 08/24/2019   Lab Results  Component Value Date   LDLCALC 77 08/24/2019   Lab Results  Component Value Date   TRIG 153.0 (H) 08/24/2019   Lab Results  Component Value Date   CHOLHDL 3 08/24/2019   Lab Results  Component Value Date   HGBA1C 6.4 (A) 06/04/2020   IMPRESSION AND PLAN:  1) Dementia, progressing. Unable to perform essential ADLs anymore. FL2 to be done.  2) Acute on chronic constipation: tramadol contributing.  She is not hurting any so we'll d/c this med. Start miralax 1 capful bid until she has several large evacuations.  3) DM2: good control, on relatively small doses of insulin.  Next a1c due around 09/04/20. No hypoglycemic awareness per her report today. Says gluc 66 yest morning, in the  morning today it was 108.  She is eating about 50% of her usual. Will decrease tresiba to 18 U qhs and decrease novolog to 2 U qBF and 2U qSupper. Instructions: Continue to check your sugar before breakfast, before supper, and at bedtime and PLEASE WRITE THESE NUMBERS DOWN AND BRING THEM WITH YOU TO  NEXT APPOINTMENT IN 2 WEEKS.  4) CRI III: GFR at baseline 1 wk ago.   All meds at approp doses.  Needs to drink lots more fluids. She does not take any NSAIDs.  5) Hip fx with ORIF on 06/25/20.   She is rehabing very well, using walker well. No pain.  6) Acute post-op blood loss anemia: Hb was back up appropriately last week. No iron supplement. An After Visit Summary was printed and given to the patient.  Spent 55 min with pt today reviewing HPI, reviewing relevant past history, doing exam, reviewing and discussing lab and imaging data, and formulating plans.  FOLLOW UP: Return in about 2 weeks (around 08/31/2020) for f/u dementia and diabetes.  Signed:  Crissie Sickles, MD           08/17/2020

## 2020-08-17 NOTE — Patient Instructions (Addendum)
Decrease your Tyler Aas to 18 Units every night around bedtime. Decrease your novolog to 2 units with breakfast and 2 units with supper. Continue to check your sugar before breakfast, before supper, and at bedtime and PLEASE WRITE THESE NUMBERS DOWN AND BRING THEM WITH YOU TO NEXT APPOINTMENT IN 2 WEEKS.  We'll have your pharmacist take your tramadol out of your pillpack and hopefully this will help alleviate your constipation some.  Start taking 1 capful of miralax with 8 oz of water twice per day.

## 2020-08-20 ENCOUNTER — Telehealth: Payer: Self-pay | Admitting: Family Medicine

## 2020-08-20 DIAGNOSIS — M6281 Muscle weakness (generalized): Secondary | ICD-10-CM | POA: Diagnosis not present

## 2020-08-20 DIAGNOSIS — R41841 Cognitive communication deficit: Secondary | ICD-10-CM | POA: Diagnosis not present

## 2020-08-20 DIAGNOSIS — R2681 Unsteadiness on feet: Secondary | ICD-10-CM | POA: Diagnosis not present

## 2020-08-20 DIAGNOSIS — R296 Repeated falls: Secondary | ICD-10-CM | POA: Diagnosis not present

## 2020-08-20 DIAGNOSIS — R488 Other symbolic dysfunctions: Secondary | ICD-10-CM | POA: Diagnosis not present

## 2020-08-20 NOTE — Telephone Encounter (Signed)
Patient's son is calling in stating Vanessa Gibson fell this pass weekend, broke her hip but occupational therapy wants to know if Dr.Mcgowen wants her to f/u with him to have x-rays done or to continue with OT. Able to bare weight on hip and walk with a walker but will have some really bad discomfort after walking/standing for a short period of time.

## 2020-08-20 NOTE — Telephone Encounter (Signed)
Noted  

## 2020-08-20 NOTE — Telephone Encounter (Signed)
Spoke with Vanessa Gibson who stated that she injured the opposite hip of broken hip, unsure which one was broken 2 mos ago. Vanessa Gibson stated that the hip was evaluated by staff in [facility], no imaging to confirm if broken but presents with bruising. Pt is currently doing PT, pt declines a visit and states will go to the ER if worsens and sched an appt at that time. Pt and family has no concerns that needs to be address at the moment.

## 2020-08-20 NOTE — Telephone Encounter (Signed)
I need more information: please call pt's daughter [name] or her son [name] and get some details: which hip? Why was she not taken to the emergency department.  How do they know it is broken? Let me know when you get more info.

## 2020-08-20 NOTE — Telephone Encounter (Signed)
Please read and advise message below.

## 2020-08-21 ENCOUNTER — Other Ambulatory Visit: Payer: Self-pay

## 2020-08-21 ENCOUNTER — Ambulatory Visit (INDEPENDENT_AMBULATORY_CARE_PROVIDER_SITE_OTHER): Payer: Medicare HMO | Admitting: Ophthalmology

## 2020-08-21 ENCOUNTER — Other Ambulatory Visit: Payer: Self-pay | Admitting: Family Medicine

## 2020-08-21 ENCOUNTER — Encounter (INDEPENDENT_AMBULATORY_CARE_PROVIDER_SITE_OTHER): Payer: Self-pay | Admitting: Ophthalmology

## 2020-08-21 DIAGNOSIS — H353221 Exudative age-related macular degeneration, left eye, with active choroidal neovascularization: Secondary | ICD-10-CM | POA: Diagnosis not present

## 2020-08-21 DIAGNOSIS — H353211 Exudative age-related macular degeneration, right eye, with active choroidal neovascularization: Secondary | ICD-10-CM | POA: Diagnosis not present

## 2020-08-21 DIAGNOSIS — Z1159 Encounter for screening for other viral diseases: Secondary | ICD-10-CM | POA: Diagnosis not present

## 2020-08-21 DIAGNOSIS — H353231 Exudative age-related macular degeneration, bilateral, with active choroidal neovascularization: Secondary | ICD-10-CM | POA: Diagnosis not present

## 2020-08-21 DIAGNOSIS — Z20828 Contact with and (suspected) exposure to other viral communicable diseases: Secondary | ICD-10-CM | POA: Diagnosis not present

## 2020-08-21 DIAGNOSIS — E1169 Type 2 diabetes mellitus with other specified complication: Secondary | ICD-10-CM

## 2020-08-21 MED ORDER — BEVACIZUMAB CHEMO INJECTION 1.25MG/0.05ML SYRINGE FOR KALEIDOSCOPE
1.2500 mg | INTRAVITREAL | Status: AC | PRN
  Administered 2020-08-21: 1.25 mg via INTRAVITREAL

## 2020-08-21 NOTE — Progress Notes (Signed)
08/21/2020     CHIEF COMPLAINT Patient presents for Retina Follow Up   HISTORY OF PRESENT ILLNESS: Vanessa Gibson is a 86 y.o. female who presents to the clinic today for:   HPI    Retina Follow Up    Patient presents with  Wet AMD.  In left eye.  Severity is moderate.  Duration of 11 weeks.  Since onset it is stable.  I, the attending physician,  performed the HPI with the patient and updated documentation appropriately.          Comments    11 Week Wet AMD f\u OS. Possible Avastin OS. OCT  Pt states vision is stable. Denies any complaints. Pt uses gls for reading, has yet to get distance RX due to broken hip.       Last edited by Tilda Franco on 08/21/2020  1:26 PM. (History)      Referring physician: Tammi Sou, MD [address] [place],  [place] [address]  HISTORICAL INFORMATION:   Selected notes from the MEDICAL RECORD NUMBER    Lab Results  Component Value Date   HGBA1C 6.4 (A) 06/04/2020     CURRENT MEDICATIONS: Current Outpatient Medications (Ophthalmic Drugs)  Medication Sig  . Olopatadine HCl (PATADAY) 0.2 % SOLN Apply 1 drop to each eye once daily.   No current facility-administered medications for this visit. (Ophthalmic Drugs)   Current Outpatient Medications (Other)  Medication Sig  . ACCU-CHEK GUIDE test strip CHECK BLOOD SUGAR THREE TO FOUR TIMES DAILY  . Accu-Chek Softclix Lancets lancets Use as instructed to check blood sugar 3-4 times daily. (Patient taking differently: 1 each by Other route See admin instructions. Use as instructed to check blood sugar 3-4 times daily.)  . Alcohol Swabs (B-D SINGLE USE SWABS REGULAR) PADS USE TO CLEAN FINGER BEFORE CHECKING BLOOD SUGARS AND CLEAN SKIN BEFORE INSULIN INJECTIONS. (Patient taking differently: 1 each by Other route See admin instructions. Use to clean finger before checking blood sugars and clean skin before insulin injections.)  . amLODipine (NORVASC) 5 MG tablet Take 1 tablet (5 mg  total) by mouth daily.  Marland Kitchen aspirin EC 81 MG tablet Take 81 mg by mouth daily.  . Blood Glucose Monitoring Suppl (ACCU-CHEK GUIDE) w/Device KIT Use Accu Chek Guide meter to check blood sugar 3-4 times daily. (Patient taking differently: 1 each by Other route See admin instructions. Use Accu Chek Guide meter to check blood sugar 3-4 times daily. )  . citalopram (CELEXA) 20 MG tablet TAKE 1 TABLET ONCE DAILY.  Marland Kitchen docusate sodium (COLACE) 100 MG capsule Take 1 capsule (100 mg total) by mouth 2 (two) times daily.  . DROPLET PEN NEEDLES 32G X 4 MM MISC USE  TO  INJECT  INSULIN EVERY DAY  . feeding supplement, GLUCERNA SHAKE, (GLUCERNA SHAKE) LIQD Take 237 mLs by mouth 3 (three) times daily between meals.  . fluticasone (FLONASE) 50 MCG/ACT nasal spray Place 2 sprays into both nostrils daily.  . insulin aspart (NOVOLOG FLEXPEN) 100 UNIT/ML FlexPen 2 U SQ with breakfast and 2 units SQ with supper  . insulin degludec (TRESIBA FLEXTOUCH) 100 UNIT/ML FlexTouch Pen 18 U SQ qhs  . levocetirizine (XYZAL) 5 MG tablet TAKE 1/2 TABLET IN THE EVENING.  . LORazepam (ATIVAN) 0.5 MG tablet Take 1 tablet (0.5 mg total) by mouth at bedtime.  . lovastatin (MEVACOR) 20 MG tablet TAKE ONE TABLET AT BEDTIME. (Patient taking differently: Take 20 mg by mouth at bedtime. )  .  metoprolol tartrate (LOPRESSOR) 25 MG tablet Take 0.5 tablets (12.5 mg total) by mouth 2 (two) times daily. TAKE 1 TABLET BY MOUTH TWICE DAILY. (Patient taking differently: Take 25 mg by mouth 2 (two) times daily. )  . montelukast (SINGULAIR) 10 MG tablet Take 1 tablet (10 mg total) by mouth at bedtime.  . Multiple Vitamins-Minerals (CENTRUM SILVER 50+WOMEN) TABS Take 1 tablet by mouth daily.  Marland Kitchen omeprazole (PRILOSEC) 40 MG capsule TAKE 1 CAPSULE EVERY DAY  . oxybutynin (DITROPAN XL) 10 MG 24 hr tablet Take 1 tablet (10 mg total) by mouth at bedtime.  . pregabalin (LYRICA) 25 MG capsule TAKE 1 CAPSULE DAILY AFTER SUPPER.   No current facility-administered  medications for this visit. (Other)      REVIEW OF SYSTEMS:    ALLERGIES No Known Allergies  PAST MEDICAL HISTORY Past Medical History:  Diagnosis Date  . Abnormal EKG    RBBB and LAFB  . Anxiety and depression   . Axillary adenopathy 01/2020   CT 12/2019 incidental finding, pt had recent covid vaccine in ipsilateral arm.  F/u axillary u/s 1 mo later showed dec in size of node.  F/u axillary u/s 3 mo recommended.  . Chronic renal insufficiency, stage 3 (moderate)    GFR @40   . Chronic venous insufficiency   . Closed displaced fracture of right femoral neck ([place]) 06/2020  . Diabetes mellitus ([place])    managed by endo Dr. Dwyane Dee  . Dizziness   . DOE (dyspnea on exertion)    no desat, CT chest ok except mild small airways dz. Hiatal hernia.  DECONDITIONING  . Femoral neck fracture ([place]) 06/25/2020   Right: ORIF  . GERD (gastroesophageal reflux disease)   . Hard of hearing   . Hiatal hernia    Moderate size (contributing to pt's DOE?)  . History of bilateral breast cancer approx age 17   bilat mastectomy then tamoxifen x 10 yrs  . Hyperlipemia   . Hypertension   . IBS (irritable bowel syndrome)   . Mandibular fracture ([place]) 12/04/2015  . MI (myocardial infarction) ([place])   . Osteoarthritis, multiple sites   . Paresthesias    burning, hands and feet.  Gabapentin no help.  Dr. Dwyane Dee started her on lyrica 02/2020.  . Skin cancer of face   . Ventral hernia "many years"   supraumbilical   Past Surgical History:  Procedure Laterality Date  . BREAST SURGERY    . Cardiac event monitor  2016   Normal  . CATARACT EXTRACTION W/ INTRAOCULAR LENS  IMPLANT, BILATERAL Bilateral   . CHOLECYSTECTOMY OPEN  1970's  . FEMUR IM NAIL Right 06/26/2020   Procedure: INTERMEDULLARY FEMORAL AFFIXUS NAIL;  Surgeon: Altamese Gorst, MD;  Location: [facility];  Service: Orthopedics;  Laterality: Right;  . MASTECTOMY Bilateral   . PFTs  04/05/2018   Interpretation: The FVC, FEV1, FEV1/FVC ratio and  FEF25-75% are within normal limits. Patient effort was erratic, but mild dec in diff capacity noted--mild dec functional alveolar tissue  . TRANSTHORACIC ECHOCARDIOGRAM  11/09/2019   EF 60-65%, grd I DD.    FAMILY HISTORY History reviewed. No pertinent family history.  SOCIAL HISTORY Social History   Tobacco Use  . Smoking status: Never Smoker  . Smokeless tobacco: Never Used  Vaping Use  . Vaping Use: Never used  Substance Use Topics  . Alcohol use: No  . Drug use: No         OPHTHALMIC EXAM:  Base Eye Exam  Visual Acuity (Snellen - Linear)      Right Left   Dist [place] 20/80 -2 20/60 -2   Dist ph [place] 20/60 -2 20/50 -1       Tonometry (Tonopen, 1:31 PM)      Right Left   Pressure 17 18       Pupils      Pupils Dark Light Shape React APD   Right PERRL 3 3 Round Minimal None   Left PERRL 3 3 Round Minimal None       Visual Fields (Counting fingers)      Left Right    Full Full       Neuro/Psych    Oriented x3: Yes   Mood/Affect: Normal       Dilation    Left eye: 1.0% Mydriacyl, 2.5% Phenylephrine @ 1:31 PM        Slit Lamp and Fundus Exam    External Exam      Right Left   External Normal Normal       Slit Lamp Exam      Right Left   Lids/Lashes Normal Normal   Conjunctiva/Sclera White and quiet White and quiet   Cornea Clear Clear   Anterior Chamber Deep and quiet Deep and quiet   Iris Round and reactive Round and reactive   Lens Posterior chamber intraocular lens Posterior chamber intraocular lens   Anterior Vitreous Normal Normal       Fundus Exam      Right Left   Posterior Vitreous  Posterior vitreous detachment   Disc  Normal   C/D Ratio  0.35   Macula  Soft drusen, no macular thickening, no hemorrhage, no exudates   Vessels  Normal   Periphery  Normal          IMAGING AND PROCEDURES  Imaging and Procedures for 08/21/20  OCT, Retina - OU - Both Eyes       Right Eye Quality was good. Scan locations included subfoveal.  Central Foveal Thickness: 356. Progression has worsened. Findings include choroidal neovascular membrane, intraretinal fluid, subretinal fluid, pigment epithelial detachment.   Left Eye Quality was good. Scan locations included subfoveal. Central Foveal Thickness: 282. Progression has worsened. Findings include subretinal fluid, intraretinal fluid, choroidal neovascular membrane.   Notes Subfoveal subretinal fluid with pigment epithelial detachment OD suggestive of new onset CNVM  OS with recurrence of intraretinal fluid temporal to the fovea with CNVM, will repeat injection OS today and OD will need therapy within 1 to 2 weeks       Intravitreal Injection, Pharmacologic Agent - OS - Left Eye       Time Out 08/21/2020. 2:04 PM. Confirmed correct patient, procedure, site, and patient consented.   Anesthesia Topical anesthesia was used. Anesthetic medications included [place] 3.5%.   Procedure Preparation included Tobramycin 0.3%, Ofloxacin , 10% betadine to eyelids, 5% betadine to ocular surface. A supplied needle was used.   Injection:  1.25 mg Bevacizumab (AVASTIN) SOLN   NDC: [address]-6, Lot: [id number]   Route: Intravitreal, Site: Left Eye, Waste: 0 mg  Post-op Post injection exam found visual acuity of at least counting fingers. The patient tolerated the procedure well. There were no complications. The patient received written and verbal post procedure care education. Post injection medications were not given.                 ASSESSMENT/PLAN:  Exudative age-related macular degeneration of left eye with active choroidal neovascularization (HCC) Currently at 11  weeks follow-up secondary to missed appointments from leg injury, hip fracture  Current CNVM active, repeat injection Avastin OS today planned repeat injection left eye in 6 weeks  Exudative age-related macular degeneration of right eye with active choroidal neovascularization ([place]) Current condition of the right  eye with subfoveal fluid, will need treatment within a week      ICD-10-CM   1. Exudative age-related macular degeneration of left eye with active choroidal neovascularization (HCC)  H35.3221 OCT, Retina - OU - Both Eyes    Intravitreal Injection, Pharmacologic Agent - OS - Left Eye    Bevacizumab (AVASTIN) SOLN 1.25 mg  2. Exudative age-related macular degeneration of right eye with active choroidal neovascularization ([place])  H35.3211     1.  Will repeat injection intravitreal Avastin OS today and examination left eye in 6 weeks  2.  Right eye today with recurrence of the lesion, last treatment was over 3 months previous.  We will schedule intravitreal Avastin OD within 1 week  3.  Ophthalmic Meds Ordered this visit:  Meds ordered this encounter  Medications  . Bevacizumab (AVASTIN) SOLN 1.25 mg       Return in about 1 week (around 08/28/2020), or Within 1 week, for dilate, OD, AVASTIN OCT.  Patient Instructions  Patient asked to notify the office promptly if new visual loss or distortion develop    Explained the diagnoses, plan, and follow up with the patient and they expressed understanding.  Patient expressed understanding of the importance of proper follow up care.   Clent Demark Leshia Kope M.D. Diseases & Surgery of the Retina and Vitreous [facility]ina [facility] [place] 08/21/20     Abbreviations: M myopia (nearsighted); A astigmatism; H hyperopia (farsighted); P presbyopia; Mrx spectacle prescription;  CTL contact lenses; OD right eye; OS left eye; OU both eyes  XT exotropia; ET esotropia; PEK punctate epithelial keratitis; PEE punctate epithelial erosions; DES dry eye syndrome; MGD meibomian gland dysfunction; ATs artificial tears; PFAT's preservative free artificial tears; [place] nuclear sclerotic cataract; PSC posterior subcapsular cataract; ERM epi-retinal membrane; PVD posterior vitreous detachment; RD retinal detachment; DM diabetes mellitus; DR diabetic retinopathy; NPDR  non-proliferative diabetic retinopathy; PDR proliferative diabetic retinopathy; CSME clinically significant macular edema; DME diabetic macular edema; dbh dot blot hemorrhages; CWS cotton wool spot; POAG primary open angle glaucoma; C/D cup-to-disc ratio; HVF humphrey visual field; GVF goldmann visual field; OCT optical coherence tomography; IOP intraocular pressure; BRVO Branch retinal vein occlusion; CRVO central retinal vein occlusion; CRAO central retinal artery occlusion; BRAO branch retinal artery occlusion; RT retinal tear; SB scleral buckle; PPV pars plana vitrectomy; VH Vitreous hemorrhage; PRP panretinal laser photocoagulation; IVK intravitreal kenalog; VMT vitreomacular traction; MH Macular hole;  NVD neovascularization of the disc; NVE neovascularization elsewhere; AREDS age related eye disease study; ARMD age related macular degeneration; POAG primary open angle glaucoma; EBMD epithelial/anterior basement membrane dystrophy; ACIOL anterior chamber intraocular lens; IOL intraocular lens; PCIOL posterior chamber intraocular lens; Phaco/IOL phacoemulsification with intraocular lens placement; [place] photorefractive keratectomy; LASIK laser assisted in situ keratomileusis; HTN hypertension; DM diabetes mellitus; COPD chronic obstructive pulmonary disease

## 2020-08-21 NOTE — Patient Instructions (Signed)
Patient asked to notify the office promptly if new visual loss or distortion develop

## 2020-08-21 NOTE — Assessment & Plan Note (Signed)
Currently at 11 weeks follow-up secondary to missed appointments from leg injury, hip fracture  Current CNVM active, repeat injection Avastin OS today planned repeat injection left eye in 6 weeks

## 2020-08-21 NOTE — Assessment & Plan Note (Signed)
Current condition of the right eye with subfoveal fluid, will need treatment within a week

## 2020-08-22 ENCOUNTER — Telehealth: Payer: Self-pay

## 2020-08-22 DIAGNOSIS — M6281 Muscle weakness (generalized): Secondary | ICD-10-CM | POA: Diagnosis not present

## 2020-08-22 DIAGNOSIS — R2681 Unsteadiness on feet: Secondary | ICD-10-CM | POA: Diagnosis not present

## 2020-08-22 DIAGNOSIS — R296 Repeated falls: Secondary | ICD-10-CM | POA: Diagnosis not present

## 2020-08-22 NOTE — Telephone Encounter (Signed)
Pt was last seen 08/17/20, unable to locate FL2 form that was brought in during appt and unsure if this is needed for completion along with letter.    Please advise, thanks.

## 2020-08-22 NOTE — Telephone Encounter (Signed)
Pls call pt's son [name] and find out what he specifically needs. I did a letter for him already and sent it to him through [place].

## 2020-08-22 NOTE — Telephone Encounter (Signed)
Son, [place], requesting info of pt cognitive state & supportive info for pt to be able to go to assisted living.  [name] [phone].

## 2020-08-23 ENCOUNTER — Ambulatory Visit: Payer: Medicare HMO | Admitting: Cardiology

## 2020-08-23 DIAGNOSIS — R41841 Cognitive communication deficit: Secondary | ICD-10-CM | POA: Diagnosis not present

## 2020-08-23 DIAGNOSIS — R296 Repeated falls: Secondary | ICD-10-CM | POA: Diagnosis not present

## 2020-08-23 DIAGNOSIS — R488 Other symbolic dysfunctions: Secondary | ICD-10-CM | POA: Diagnosis not present

## 2020-08-23 DIAGNOSIS — M6281 Muscle weakness (generalized): Secondary | ICD-10-CM | POA: Diagnosis not present

## 2020-08-23 NOTE — Telephone Encounter (Signed)
Spoke with [name], and he stated he did not look at the letter sent via mychart but will call back if anything else is needed for letter.

## 2020-08-24 DIAGNOSIS — M6281 Muscle weakness (generalized): Secondary | ICD-10-CM | POA: Diagnosis not present

## 2020-08-24 DIAGNOSIS — R488 Other symbolic dysfunctions: Secondary | ICD-10-CM | POA: Diagnosis not present

## 2020-08-24 DIAGNOSIS — R296 Repeated falls: Secondary | ICD-10-CM | POA: Diagnosis not present

## 2020-08-24 DIAGNOSIS — R41841 Cognitive communication deficit: Secondary | ICD-10-CM | POA: Diagnosis not present

## 2020-08-24 DIAGNOSIS — R2681 Unsteadiness on feet: Secondary | ICD-10-CM | POA: Diagnosis not present

## 2020-08-28 ENCOUNTER — Other Ambulatory Visit: Payer: Self-pay | Admitting: Family Medicine

## 2020-08-28 ENCOUNTER — Other Ambulatory Visit: Payer: Self-pay

## 2020-08-28 DIAGNOSIS — J3089 Other allergic rhinitis: Secondary | ICD-10-CM

## 2020-08-28 DIAGNOSIS — R41841 Cognitive communication deficit: Secondary | ICD-10-CM | POA: Diagnosis not present

## 2020-08-28 DIAGNOSIS — R488 Other symbolic dysfunctions: Secondary | ICD-10-CM | POA: Diagnosis not present

## 2020-08-28 DIAGNOSIS — R296 Repeated falls: Secondary | ICD-10-CM | POA: Diagnosis not present

## 2020-08-28 DIAGNOSIS — J301 Allergic rhinitis due to pollen: Secondary | ICD-10-CM

## 2020-08-28 DIAGNOSIS — G4700 Insomnia, unspecified: Secondary | ICD-10-CM

## 2020-08-28 DIAGNOSIS — R2681 Unsteadiness on feet: Secondary | ICD-10-CM | POA: Diagnosis not present

## 2020-08-28 DIAGNOSIS — K219 Gastro-esophageal reflux disease without esophagitis: Secondary | ICD-10-CM

## 2020-08-28 DIAGNOSIS — Z1159 Encounter for screening for other viral diseases: Secondary | ICD-10-CM | POA: Diagnosis not present

## 2020-08-28 DIAGNOSIS — E1169 Type 2 diabetes mellitus with other specified complication: Secondary | ICD-10-CM

## 2020-08-28 DIAGNOSIS — M6281 Muscle weakness (generalized): Secondary | ICD-10-CM | POA: Diagnosis not present

## 2020-08-28 DIAGNOSIS — Z20828 Contact with and (suspected) exposure to other viral communicable diseases: Secondary | ICD-10-CM | POA: Diagnosis not present

## 2020-08-28 DIAGNOSIS — E785 Hyperlipidemia, unspecified: Secondary | ICD-10-CM

## 2020-08-28 NOTE — Telephone Encounter (Signed)
Patients son called stating that her [employer] called him and told him to have [place] resend all of her prescriptions to them. Son states [employer] told him we would know what this means. When I shared with him that she has 11 different medications prescribed by this office on her med list he said to just refill them all.

## 2020-08-28 NOTE — Telephone Encounter (Signed)
Please advise, thanks. Medications pending. Last seen 08/17/20 and next appt is 08/31/20. CSC, none and UDS none

## 2020-08-29 DIAGNOSIS — R296 Repeated falls: Secondary | ICD-10-CM | POA: Diagnosis not present

## 2020-08-29 DIAGNOSIS — M6281 Muscle weakness (generalized): Secondary | ICD-10-CM | POA: Diagnosis not present

## 2020-08-29 MED ORDER — OXYBUTYNIN CHLORIDE ER 10 MG PO TB24: 10.0000 mg | ORAL_TABLET | Freq: Every day | ORAL | 3 refills | Status: AC

## 2020-08-29 MED ORDER — FLUTICASONE PROPIONATE 50 MCG/ACT NA SUSP: 2.0000 | Freq: Every day | NASAL | 3 refills | Status: AC

## 2020-08-29 MED ORDER — CITALOPRAM HYDROBROMIDE 20 MG PO TABS: 20.0000 mg | ORAL_TABLET | Freq: Every day | ORAL | 3 refills | Status: AC

## 2020-08-29 MED ORDER — LORAZEPAM 0.5 MG PO TABS: 0.5000 mg | ORAL_TABLET | Freq: Every day | ORAL | 1 refills | Status: AC

## 2020-08-29 MED ORDER — LEVOCETIRIZINE DIHYDROCHLORIDE 5 MG PO TABS: ORAL_TABLET | ORAL | 3 refills | Status: AC

## 2020-08-29 MED ORDER — OMEPRAZOLE 40 MG PO CPDR: 40.0000 mg | DELAYED_RELEASE_CAPSULE | Freq: Every day | ORAL | 3 refills | Status: AC

## 2020-08-29 MED ORDER — NOVOLOG FLEXPEN 100 UNIT/ML ~~LOC~~ SOPN: PEN_INJECTOR | SUBCUTANEOUS | 3 refills | Status: AC

## 2020-08-29 MED ORDER — PREGABALIN 25 MG PO CAPS: ORAL_CAPSULE | ORAL | 1 refills | Status: AC

## 2020-08-29 MED ORDER — AMLODIPINE BESYLATE 5 MG PO TABS: 5.0000 mg | ORAL_TABLET | Freq: Every day | ORAL | 3 refills | Status: AC

## 2020-08-29 MED ORDER — LOVASTATIN 20 MG PO TABS: 20.0000 mg | ORAL_TABLET | Freq: Every day | ORAL | 3 refills | Status: AC

## 2020-08-29 MED ORDER — TRESIBA FLEXTOUCH 100 UNIT/ML ~~LOC~~ SOPN: PEN_INJECTOR | SUBCUTANEOUS | 3 refills | Status: AC

## 2020-08-29 NOTE — Telephone Encounter (Signed)
Patient's son, [name] advised all refills submitted to [facility] ([facility]

## 2020-08-30 ENCOUNTER — Ambulatory Visit (INDEPENDENT_AMBULATORY_CARE_PROVIDER_SITE_OTHER): Payer: Medicare HMO | Admitting: Ophthalmology

## 2020-08-30 ENCOUNTER — Other Ambulatory Visit: Payer: Self-pay

## 2020-08-30 ENCOUNTER — Encounter (INDEPENDENT_AMBULATORY_CARE_PROVIDER_SITE_OTHER): Payer: Self-pay | Admitting: Ophthalmology

## 2020-08-30 DIAGNOSIS — H353221 Exudative age-related macular degeneration, left eye, with active choroidal neovascularization: Secondary | ICD-10-CM | POA: Diagnosis not present

## 2020-08-30 DIAGNOSIS — Z4789 Encounter for other orthopedic aftercare: Secondary | ICD-10-CM | POA: Diagnosis not present

## 2020-08-30 DIAGNOSIS — H353211 Exudative age-related macular degeneration, right eye, with active choroidal neovascularization: Secondary | ICD-10-CM

## 2020-08-30 DIAGNOSIS — M6281 Muscle weakness (generalized): Secondary | ICD-10-CM | POA: Diagnosis not present

## 2020-08-30 DIAGNOSIS — R2681 Unsteadiness on feet: Secondary | ICD-10-CM | POA: Diagnosis not present

## 2020-08-30 DIAGNOSIS — R296 Repeated falls: Secondary | ICD-10-CM | POA: Diagnosis not present

## 2020-08-30 MED ORDER — BEVACIZUMAB CHEMO INJECTION 1.25MG/0.05ML SYRINGE FOR KALEIDOSCOPE
1.2500 mg | INTRAVITREAL | Status: AC | PRN
  Administered 2020-08-30: 1.25 mg via INTRAVITREAL

## 2020-08-30 NOTE — Patient Instructions (Signed)
Patient asked to report new onset visual acuity declines or distortions

## 2020-08-30 NOTE — Progress Notes (Signed)
08/30/2020     CHIEF COMPLAINT Patient presents for Retina Follow Up   HISTORY OF PRESENT ILLNESS: Vanessa Gibson is a 86 y.o. female who presents to the clinic today for:   HPI    Retina Follow Up    Patient presents with  Wet AMD.  In right eye.  Severity is moderate.  Duration of 5 months.  Since onset it is stable.  I, the attending physician,  performed the HPI with the patient and updated documentation appropriately.          Comments    5 Month Wet AMD f\u OD. Possible Avastin OD. OCT  Pt states vision is stable since last week. Denies any new complaints.       Last edited by Tilda Franco on 08/30/2020  3:00 PM. (History)      Referring physician: Tammi Sou, MD [address] [place],   [id number]  HISTORICAL INFORMATION:   Selected notes from the MEDICAL RECORD NUMBER    Lab Results  Component Value Date   HGBA1C 6.4 (A) 06/04/2020     CURRENT MEDICATIONS: Current Outpatient Medications (Ophthalmic Drugs)  Medication Sig  . Olopatadine HCl (PATADAY) 0.2 % SOLN Apply 1 drop to each eye once daily.   No current facility-administered medications for this visit. (Ophthalmic Drugs)   Current Outpatient Medications (Other)  Medication Sig  . ACCU-CHEK GUIDE test strip CHECK BLOOD SUGAR THREE TO FOUR TIMES DAILY  . Accu-Chek Softclix Lancets lancets Use as instructed to check blood sugar 3-4 times daily. (Patient taking differently: 1 each by Other route See admin instructions. Use as instructed to check blood sugar 3-4 times daily.)  . Alcohol Swabs (B-D SINGLE USE SWABS REGULAR) PADS USE TO CLEAN FINGER BEFORE CHECKING BLOOD SUGARS AND CLEAN SKIN BEFORE INSULIN INJECTIONS. (Patient taking differently: 1 each by Other route See admin instructions. Use to clean finger before checking blood sugars and clean skin before insulin injections.)  . amLODipine (NORVASC) 5 MG tablet Take 1 tablet (5 mg total) by mouth daily.  Marland Kitchen aspirin EC 81 MG tablet  Take 81 mg by mouth daily.  . Blood Glucose Monitoring Suppl (ACCU-CHEK GUIDE) w/Device KIT Use Accu Chek Guide meter to check blood sugar 3-4 times daily. (Patient taking differently: 1 each by Other route See admin instructions. Use Accu Chek Guide meter to check blood sugar 3-4 times daily. )  . citalopram (CELEXA) 20 MG tablet Take 1 tablet (20 mg total) by mouth daily.  Marland Kitchen docusate sodium (COLACE) 100 MG capsule Take 1 capsule (100 mg total) by mouth 2 (two) times daily.  . DROPLET PEN NEEDLES 32G X 4 MM MISC USE  TO  INJECT  INSULIN EVERY DAY  . feeding supplement, GLUCERNA SHAKE, (GLUCERNA SHAKE) LIQD Take 237 mLs by mouth 3 (three) times daily between meals.  . fluticasone (FLONASE) 50 MCG/ACT nasal spray Place 2 sprays into both nostrils daily.  . insulin aspart (NOVOLOG FLEXPEN) 100 UNIT/ML FlexPen 2 U SQ with breakfast and 2 units SQ with supper  . insulin degludec (TRESIBA FLEXTOUCH) 100 UNIT/ML FlexTouch Pen 18 U SQ qhs  . levocetirizine (XYZAL) 5 MG tablet TAKE 1/2 TABLET IN THE EVENING.  . LORazepam (ATIVAN) 0.5 MG tablet Take 1 tablet (0.5 mg total) by mouth at bedtime.  . lovastatin (MEVACOR) 20 MG tablet Take 1 tablet (20 mg total) by mouth at bedtime.  . metoprolol tartrate (LOPRESSOR) 25 MG tablet Take 0.5 tablets (12.5 mg  total) by mouth 2 (two) times daily. TAKE 1 TABLET BY MOUTH TWICE DAILY. (Patient taking differently: Take 25 mg by mouth 2 (two) times daily. )  . montelukast (SINGULAIR) 10 MG tablet Take 1 tablet (10 mg total) by mouth at bedtime.  . Multiple Vitamins-Minerals (CENTRUM SILVER 50+WOMEN) TABS Take 1 tablet by mouth daily.  Marland Kitchen omeprazole (PRILOSEC) 40 MG capsule Take 1 capsule (40 mg total) by mouth daily.  Marland Kitchen oxybutynin (DITROPAN XL) 10 MG 24 hr tablet Take 1 tablet (10 mg total) by mouth at bedtime.  . pregabalin (LYRICA) 25 MG capsule TAKE 1 CAPSULE DAILY AFTER SUPPER.   No current facility-administered medications for this visit. (Other)      REVIEW OF  SYSTEMS:    ALLERGIES No Known Allergies  PAST MEDICAL HISTORY Past Medical History:  Diagnosis Date  . Abnormal EKG    RBBB and LAFB  . Anxiety and depression   . Axillary adenopathy 01/2020   CT 12/2019 incidental finding, pt had recent covid vaccine in ipsilateral arm.  F/u axillary u/s 1 mo later showed dec in size of node.  F/u axillary u/s 3 mo recommended.  . Chronic renal insufficiency, stage 3 (moderate) (HCC)    GFR @40   . Chronic venous insufficiency   . Closed displaced fracture of right femoral neck ([place]) 06/2020  . Diabetes mellitus ([place])    managed by endo Dr. Dwyane Dee  . Dizziness   . DOE (dyspnea on exertion)    no desat, CT chest ok except mild small airways dz. Hiatal hernia.  DECONDITIONING  . Femoral neck fracture ([place]) 06/25/2020   Right: ORIF  . GERD (gastroesophageal reflux disease)   . Hard of hearing   . Hiatal hernia    Moderate size (contributing to pt's DOE?)  . History of bilateral breast cancer approx age 54   bilat mastectomy then tamoxifen x 10 yrs  . Hyperlipemia   . Hypertension   . IBS (irritable bowel syndrome)   . Mandibular fracture ([place]) 12/04/2015  . MI (myocardial infarction) ([place])   . Osteoarthritis, multiple sites   . Paresthesias    burning, hands and feet.  Gabapentin no help.  Dr. Dwyane Dee started her on lyrica 02/2020.  . Skin cancer of face   . Ventral hernia "many years"   supraumbilical   Past Surgical History:  Procedure Laterality Date  . BREAST SURGERY    . Cardiac event monitor  2016   Normal  . CATARACT EXTRACTION W/ INTRAOCULAR LENS  IMPLANT, BILATERAL Bilateral   . CHOLECYSTECTOMY OPEN  1970's  . FEMUR IM NAIL Right 06/26/2020   Procedure: INTERMEDULLARY FEMORAL AFFIXUS NAIL;  Surgeon: Altamese Macon, MD;  Location: [facility];  Service: Orthopedics;  Laterality: Right;  . MASTECTOMY Bilateral   . PFTs  04/05/2018   Interpretation: The FVC, FEV1, FEV1/FVC ratio and FEF25-75% are within normal limits. Patient effort was  erratic, but mild dec in diff capacity noted--mild dec functional alveolar tissue  . TRANSTHORACIC ECHOCARDIOGRAM  11/09/2019   EF 60-65%, grd I DD.    FAMILY HISTORY History reviewed. No pertinent family history.  SOCIAL HISTORY Social History   Tobacco Use  . Smoking status: Never Smoker  . Smokeless tobacco: Never Used  Vaping Use  . Vaping Use: Never used  Substance Use Topics  . Alcohol use: No  . Drug use: No         OPHTHALMIC EXAM:  Base Eye Exam    Visual Acuity (Snellen - Linear)  Right Left   Dist [place] 20/60 -2 20/80 +   Dist ph [place] NI 20/60 -2       Tonometry (Tonopen, 3:10 PM)      Right Left   Pressure 16 13       Neuro/Psych    Oriented x3: Yes   Mood/Affect: Normal       Dilation    Right eye: 1.0% Mydriacyl, 2.5% Phenylephrine @ 3:10 PM        Slit Lamp and Fundus Exam    External Exam      Right Left   External Normal Normal       Slit Lamp Exam      Right Left   Lids/Lashes Normal Normal   Conjunctiva/Sclera White and quiet White and quiet   Cornea Clear Clear   Anterior Chamber Deep and quiet Deep and quiet   Iris Round and reactive Round and reactive   Lens Posterior chamber intraocular lens Posterior chamber intraocular lens   Anterior Vitreous Normal Normal       Fundus Exam      Right Left   Posterior Vitreous Posterior vitreous detachment    Disc Normal    C/D Ratio 0.35    Macula  Less subretinal neovascular membrane, Macular thickening, Retinal pigment epithelial atrophy, Retinal pigment epithelial mottling, Retinal pigment epithelial detachment    Vessels Normal    Periphery Normal           IMAGING AND PROCEDURES  Imaging and Procedures for 08/30/20  OCT, Retina - OU - Both Eyes       Right Eye Scan locations included subfoveal. Central Foveal Thickness: 358. Findings include subretinal fluid.   Left Eye Quality was good. Scan locations included subfoveal. Central Foveal Thickness: 257. Progression  has improved. Findings include abnormal foveal contour, no SRF.   Notes OD with recurrent subretinal fluid subfoveal, repeat injection intravitreal Avastin today and examination in 6 weeks    Macular subretinal fluid superotemporal to the macula , 1 week post Avastin, examination follow-up as scheduled       Intravitreal Injection, Pharmacologic Agent - OD - Right Eye       Time Out 08/30/2020. 3:29 PM. Confirmed correct patient, procedure, site, and patient consented.   Anesthesia Topical anesthesia was used. Anesthetic medications included Akten 3.5%.   Procedure Preparation included 10% betadine to eyelids, Tobramycin 0.3%. A 30 gauge needle was used.   Injection:  1.25 mg Bevacizumab (AVASTIN) SOLN   NDC: 70360-001-02, Lot: [id number]   Route: Intravitreal, Site: Right Eye, Waste: 0 mg  Post-op Post injection exam found visual acuity of at least counting fingers. The patient tolerated the procedure well. There were no complications. The patient received written and verbal post procedure care education. Post injection medications were not given.                 ASSESSMENT/PLAN:  Exudative age-related macular degeneration of right eye with active choroidal neovascularization (HCC) Wet ARMD, active OD, repeat injection today and examination in 6 weeks  Exudative age-related macular degeneration of left eye with active choroidal neovascularization ([place]) All active lesions superotemporal to the fovea can improve 1 week post Avastin      ICD-10-CM   1. Exudative age-related macular degeneration of right eye with active choroidal neovascularization (HCC)  H35.3211 OCT, Retina - OU - Both Eyes    Intravitreal Injection, Pharmacologic Agent - OD - Right Eye    Bevacizumab (AVASTIN) SOLN 1.25 mg  2.  Exudative age-related macular degeneration of left eye with active choroidal neovascularization ([place])  H35.3221     1.  Follow-up for active CNVM OD, patient asked questions  about how often she is in need to be treated.  I explained to the patient that treatment off and on medications only on the disease activity as defined on examination and by OCT testing here in the office.  I reported to the patient that I make every effort to decrease the interval of therapy on every visit for each eyes.  Repeat injection intravitreal Avastin OD today and examination in 6 weeks.  2.  Follow-up left eye as scheduled.  3.  Ophthalmic Meds Ordered this visit:  Meds ordered this encounter  Medications  . Bevacizumab (AVASTIN) SOLN 1.25 mg       Return in about 6 weeks (around 10/11/2020) for dilate, OD, AVASTIN OCT.  Patient Instructions  Patient asked to report new onset visual acuity declines or distortions    Explained the diagnoses, plan, and follow up with the patient and they expressed understanding.  Patient expressed understanding of the importance of proper follow up care.   Clent Demark Jaimya Feliciano M.D. Diseases & Surgery of the Retina and Vitreous [facility] 08/30/20     Abbreviations: M myopia (nearsighted); A astigmatism; H hyperopia (farsighted); P presbyopia; Mrx spectacle prescription;  CTL contact lenses; OD right eye; OS left eye; OU both eyes  XT exotropia; ET esotropia; PEK punctate epithelial keratitis; PEE punctate epithelial erosions; DES dry eye syndrome; MGD meibomian gland dysfunction; ATs artificial tears; PFAT's preservative free artificial tears; [place] nuclear sclerotic cataract; PSC posterior subcapsular cataract; ERM epi-retinal membrane; PVD posterior vitreous detachment; RD retinal detachment; DM diabetes mellitus; DR diabetic retinopathy; NPDR non-proliferative diabetic retinopathy; PDR proliferative diabetic retinopathy; CSME clinically significant macular edema; DME diabetic macular edema; dbh dot blot hemorrhages; CWS cotton wool spot; POAG primary open angle glaucoma; C/D cup-to-disc ratio; HVF humphrey visual field; GVF  goldmann visual field; OCT optical coherence tomography; IOP intraocular pressure; BRVO Branch retinal vein occlusion; CRVO central retinal vein occlusion; CRAO central retinal artery occlusion; BRAO branch retinal artery occlusion; RT retinal tear; SB scleral buckle; PPV pars plana vitrectomy; VH Vitreous hemorrhage; PRP panretinal laser photocoagulation; IVK intravitreal kenalog; VMT vitreomacular traction; MH Macular hole;  NVD neovascularization of the disc; NVE neovascularization elsewhere; AREDS age related eye disease study; ARMD age related macular degeneration; POAG primary open angle glaucoma; EBMD epithelial/anterior basement membrane dystrophy; ACIOL anterior chamber intraocular lens; IOL intraocular lens; PCIOL posterior chamber intraocular lens; Phaco/IOL phacoemulsification with intraocular lens placement; [place] photorefractive keratectomy; LASIK laser assisted in situ keratomileusis; HTN hypertension; DM diabetes mellitus; COPD chronic obstructive pulmonary disease

## 2020-08-30 NOTE — Assessment & Plan Note (Signed)
Wet ARMD, active OD, repeat injection today and examination in 6 weeks

## 2020-08-30 NOTE — Assessment & Plan Note (Signed)
All active lesions superotemporal to the fovea can improve 1 week post Avastin

## 2020-08-31 ENCOUNTER — Encounter: Payer: Self-pay | Admitting: Family Medicine

## 2020-08-31 ENCOUNTER — Ambulatory Visit (INDEPENDENT_AMBULATORY_CARE_PROVIDER_SITE_OTHER): Payer: Medicare HMO | Admitting: Family Medicine

## 2020-08-31 VITALS — BP 151/84 | HR 81 | Temp 98.7°F | Ht 59.0 in | Wt 112.0 lb

## 2020-08-31 DIAGNOSIS — R5381 Other malaise: Secondary | ICD-10-CM

## 2020-08-31 DIAGNOSIS — Z111 Encounter for screening for respiratory tuberculosis: Secondary | ICD-10-CM | POA: Diagnosis not present

## 2020-08-31 DIAGNOSIS — M6281 Muscle weakness (generalized): Secondary | ICD-10-CM | POA: Diagnosis not present

## 2020-08-31 DIAGNOSIS — F0390 Unspecified dementia without behavioral disturbance: Secondary | ICD-10-CM

## 2020-08-31 DIAGNOSIS — R296 Repeated falls: Secondary | ICD-10-CM | POA: Diagnosis not present

## 2020-08-31 DIAGNOSIS — G4700 Insomnia, unspecified: Secondary | ICD-10-CM

## 2020-08-31 DIAGNOSIS — R41841 Cognitive communication deficit: Secondary | ICD-10-CM | POA: Diagnosis not present

## 2020-08-31 DIAGNOSIS — R2681 Unsteadiness on feet: Secondary | ICD-10-CM | POA: Diagnosis not present

## 2020-08-31 DIAGNOSIS — E1149 Type 2 diabetes mellitus with other diabetic neurological complication: Secondary | ICD-10-CM | POA: Diagnosis not present

## 2020-08-31 DIAGNOSIS — S7002XA Contusion of left hip, initial encounter: Secondary | ICD-10-CM | POA: Diagnosis not present

## 2020-08-31 DIAGNOSIS — I10 Essential (primary) hypertension: Secondary | ICD-10-CM

## 2020-08-31 DIAGNOSIS — R110 Nausea: Secondary | ICD-10-CM | POA: Diagnosis not present

## 2020-08-31 DIAGNOSIS — Z1159 Encounter for screening for other viral diseases: Secondary | ICD-10-CM | POA: Diagnosis not present

## 2020-08-31 DIAGNOSIS — Z20828 Contact with and (suspected) exposure to other viral communicable diseases: Secondary | ICD-10-CM | POA: Diagnosis not present

## 2020-08-31 DIAGNOSIS — R488 Other symbolic dysfunctions: Secondary | ICD-10-CM | POA: Diagnosis not present

## 2020-08-31 MED ORDER — ONDANSETRON HCL 4 MG PO TABS: ORAL_TABLET | ORAL | 2 refills | Status: AC

## 2020-08-31 NOTE — Patient Instructions (Signed)
Buy over the counter melatonin 5mg  tabs, take 1-2 every night at bedtime to help you sleep

## 2020-08-31 NOTE — Progress Notes (Signed)
OFFICE VISIT  08/31/2020  CC:  Chief Complaint  Patient presents with  . Follow-up    DM and dementia    HPI:    Patient is a 86 y.o. Caucasian female who presents accompanied by her daughter [name] for 2 wk f/u dementia, debilitation, s/p relatively recent right hip fracture and surgery, and DM (with recent probs with hypoglycmeia). A/P as of last visit: "1) Dementia, progressing. Unable to perform essential ADLs anymore. FL2 to be done.  2) Acute on chronic constipation: tramadol contributing.  She is not hurting any so we'll d/c this med. Start miralax 1 capful bid until she has several large evacuations.  3) DM2: good control, on relatively small doses of insulin.  Next a1c due around 09/04/20. No hypoglycemic awareness per her report today. Says gluc 66 yest morning, in the morning today it was 108.  She is eating about 50% of her usual. Will decrease tresiba to 18 U qhs and decrease novolog to 2 U qBF and 2U qSupper. Instructions: Continue to check your sugar before breakfast, before supper, and at bedtime and PLEASE WRITE THESE NUMBERS DOWN AND BRING THEM WITH YOU TO NEXT APPOINTMENT IN 2 WEEKS.  4) CRI III: GFR at baseline 1 wk ago.   All meds at approp doses.  Needs to drink lots more fluids. She does not take any NSAIDs.  5) Hip fx with ORIF on 06/25/20.   She is rehabing very well, using walker well. No pain.  6) Acute post-op blood loss anemia: Hb was back up appropriately last week. No iron supplement."  INTERIM HX:  Still has plans to go into ALF ([facility]), needs FL2 filled out and TB skin testing. She fell a week ago onto L glut/hip.  Had to be helped up but could bear wt ok. It is gradually improving: less pain, more ability to ambulate, still participating in PT for her R hip fracture/surgery.  Bruising present initially but now gone. She is not taking any pain med.  No low glucoses since last visit.  No remarkable bp abnormalities.  She is  nauseated quite a bit, sounds like a common problem for her but worse the last week since eating part of a "cuban sandwich".  She vomited after eating it but just nausea and no appetite since.  No abd pain or fevers or diarrhea.  No persistent constipation but occ leaks some stool.  Not sleeping well at all, asks if she can take a sleep supplement with melatonin in it.  Past Medical History:  Diagnosis Date  . Abnormal EKG    RBBB and LAFB  . Anxiety and depression   . Axillary adenopathy 01/2020   CT 12/2019 incidental finding, pt had recent covid vaccine in ipsilateral arm.  F/u axillary u/s 1 mo later showed dec in size of node.  F/u axillary u/s 3 mo recommended.  . Chronic renal insufficiency, stage 3 (moderate) (HCC)    GFR _0   . Chronic venous insufficiency   . Closed displaced fracture of right femoral neck ([place]) 06/2020  . Diabetes mellitus ([place])    managed by endo Dr. Dwyane Dee  . Dizziness   . DOE (dyspnea on exertion)    no desat, CT chest ok except mild small airways dz. Hiatal hernia.  DECONDITIONING  . Femoral neck fracture ([place]) 06/25/2020   Right: ORIF  . GERD (gastroesophageal reflux disease)   . Hard of hearing   . Hiatal hernia    Moderate size (contributing to pt's DOE?)  .  History of bilateral breast cancer approx age 33   bilat mastectomy then tamoxifen x 10 yrs  . Hyperlipemia   . Hypertension   . IBS (irritable bowel syndrome)   . Mandibular fracture ([place]) 12/04/2015  . MI (myocardial infarction) ([place])   . Osteoarthritis, multiple sites   . Paresthesias    burning, hands and feet.  Gabapentin no help.  Dr. Dwyane Dee started her on lyrica 02/2020.  . Skin cancer of face   . Ventral hernia "many years"   supraumbilical    Past Surgical History:  Procedure Laterality Date  . BREAST SURGERY    . Cardiac event monitor  2016   Normal  . CATARACT EXTRACTION W/ INTRAOCULAR LENS  IMPLANT, BILATERAL Bilateral   . CHOLECYSTECTOMY OPEN  1970's  . FEMUR IM NAIL Right  06/26/2020   Procedure: INTERMEDULLARY FEMORAL AFFIXUS NAIL;  Surgeon: Altamese [place], MD;  Location: [facility];  Service: Orthopedics;  Laterality: Right;  . MASTECTOMY Bilateral   . PFTs  04/05/2018   Interpretation: The FVC, FEV1, FEV1/FVC ratio and FEF25-75% are within normal limits. Patient effort was erratic, but mild dec in diff capacity noted--mild dec functional alveolar tissue  . TRANSTHORACIC ECHOCARDIOGRAM  11/09/2019   EF 60-65%, grd I DD.    Outpatient Medications Prior to Visit  Medication Sig Dispense Refill  . ACCU-CHEK GUIDE test strip CHECK BLOOD SUGAR THREE TO FOUR TIMES DAILY 400 strip 2  . Accu-Chek Softclix Lancets lancets Use as instructed to check blood sugar 3-4 times daily. (Patient taking differently: 1 each by Other route See admin instructions. Use as instructed to check blood sugar 3-4 times daily.) 400 each 2  . Alcohol Swabs (B-D SINGLE USE SWABS REGULAR) PADS USE TO CLEAN FINGER BEFORE CHECKING BLOOD SUGARS AND CLEAN SKIN BEFORE INSULIN INJECTIONS. (Patient taking differently: 1 each by Other route See admin instructions. Use to clean finger before checking blood sugars and clean skin before insulin injections.) 300 each 3  . amLODipine (NORVASC) 5 MG tablet Take 1 tablet (5 mg total) by mouth daily. 90 tablet 3  . aspirin EC 81 MG tablet Take 81 mg by mouth daily.    . Blood Glucose Monitoring Suppl (ACCU-CHEK GUIDE) w/Device KIT Use Accu Chek Guide meter to check blood sugar 3-4 times daily. (Patient taking differently: 1 each by Other route See admin instructions. Use Accu Chek Guide meter to check blood sugar 3-4 times daily. ) 1 kit 0  . citalopram (CELEXA) 20 MG tablet Take 1 tablet (20 mg total) by mouth daily. 90 tablet 3  . docusate sodium (COLACE) 100 MG capsule Take 1 capsule (100 mg total) by mouth 2 (two) times daily. 10 capsule 0  . DROPLET PEN NEEDLES 32G X 4 MM MISC USE  TO  INJECT  INSULIN EVERY DAY 100 each 1  . feeding supplement, GLUCERNA SHAKE,  (GLUCERNA SHAKE) LIQD Take 237 mLs by mouth 3 (three) times daily between meals.  0  . fluticasone (FLONASE) 50 MCG/ACT nasal spray Place 2 sprays into both nostrils daily. 16 g 3  . insulin aspart (NOVOLOG FLEXPEN) 100 UNIT/ML FlexPen 2 U SQ with breakfast and 2 units SQ with supper 15 mL 3  . insulin degludec (TRESIBA FLEXTOUCH) 100 UNIT/ML FlexTouch Pen 18 U SQ qhs 30 mL 3  . levocetirizine (XYZAL) 5 MG tablet TAKE 1/2 TABLET IN THE EVENING. 45 tablet 3  . LORazepam (ATIVAN) 0.5 MG tablet Take 1 tablet (0.5 mg total) by mouth at bedtime. 90 tablet  1  . lovastatin (MEVACOR) 20 MG tablet Take 1 tablet (20 mg total) by mouth at bedtime. 90 tablet 3  . metoprolol tartrate (LOPRESSOR) 25 MG tablet Take 0.5 tablets (12.5 mg total) by mouth 2 (two) times daily. TAKE 1 TABLET BY MOUTH TWICE DAILY. (Patient taking differently: Take 25 mg by mouth 2 (two) times daily. ) 180 tablet 3  . montelukast (SINGULAIR) 10 MG tablet Take 1 tablet (10 mg total) by mouth at bedtime. 90 tablet 3  . Multiple Vitamins-Minerals (CENTRUM SILVER 50+WOMEN) TABS Take 1 tablet by mouth daily.    . Olopatadine HCl (PATADAY) 0.2 % SOLN Apply 1 drop to each eye once daily. 2.5 mL 0  . omeprazole (PRILOSEC) 40 MG capsule Take 1 capsule (40 mg total) by mouth daily. 90 capsule 3  . oxybutynin (DITROPAN XL) 10 MG 24 hr tablet Take 1 tablet (10 mg total) by mouth at bedtime. 90 tablet 3  . pregabalin (LYRICA) 25 MG capsule TAKE 1 CAPSULE DAILY AFTER SUPPER. 90 capsule 1  . ZARBEES SLEEP CHILD/MELATONIN PO Take by mouth.     No facility-administered medications prior to visit.    No Known Allergies  ROS As per HPI  PE: Vitals with BMI 08/31/2020 08/17/2020 08/10/2020  Height _0  _1  -  Weight 112 lbs 117 lbs 8 oz 115 lbs 13 oz  BMI 54.27 06.23 -  Systolic 762 831 517  Diastolic 84 72 66  Pulse 81 80 80   Exam chaperoned by Deveron Furlong, CMA. She is in a wheelchair today. Gen: Alert, well appearing.  Patient is  oriented to person, place, general time and general situation.  HOH and this affects her interactions AFFECT: pleasant, fairly lucid thought except she demonstrates poor mid/short/long term recall.  Lucid speech. Left gluteal region and hip and thigh w/out ecchymoses or hematoma.  Minimal TTP diffusely over lateral glut/hip.  Normal ROM but some pain with this. She can stand up w/out problem and stay standing for a minute at least.  LABS:  Lab Results  Component Value Date   TSH 1.54 05/07/2020   Lab Results  Component Value Date   WBC 9.5 08/10/2020   HGB 11.0 (L) 08/10/2020   HCT 33.5 (L) 08/10/2020   MCV 94.4 08/10/2020   PLT 384 08/10/2020   Lab Results  Component Value Date   CREATININE 1.05 (H) 08/10/2020   BUN 29 (H) 08/10/2020   NA 133 (L) 08/10/2020   K 4.8 08/10/2020   CL 97 (L) 08/10/2020   CO2 25 08/10/2020   Lab Results  Component Value Date   ALT 19 06/25/2020   AST 29 06/25/2020   ALKPHOS 57 06/25/2020   BILITOT 0.6 06/25/2020   Lab Results  Component Value Date   CHOL 158 08/24/2019   Lab Results  Component Value Date   HDL 50.50 08/24/2019   Lab Results  Component Value Date   LDLCALC 77 08/24/2019   Lab Results  Component Value Date   TRIG 153.0 (H) 08/24/2019   Lab Results  Component Value Date   CHOLHDL 3 08/24/2019   Lab Results  Component Value Date   HGBA1C 6.4 (A) 06/04/2020   IMPRESSION AND PLAN:  1) Hip contusion, left. I do not think she has fractured anything and don't think imaging is indicated: pt and daughter are in agreement with this.  2) Hx of Right hip fracture, with subsequent hip surgery.  Doing well with rehab for this. No pain.  3)  Acute nausea, recurrent: not clear etiology but suspect polypharmacy and anxiety playing a large role.  No sign of acute intra-abdominal process. Trial of zofran 27m, 1-2 tid prn.  4) Insomnia: start melatonin 518m 1-2 qhs.  5) Dementia and debilitation: agree with need for ALF  placement. FL2 form completed today. TB skin test placed-->return as instructed to get this read.  6) HTN: reasonably well controlled at this time. Continue lopressor 25 mg bid and amlodipine 108m46md.  7) DM 2, stable at this time.  Has diab neurop, for which she takes lyrica and seems to be doing ok on. Continue tresiba 18 U qd and 2 U mealtime insulin w/BF and 2 U qSupper.  Spent 45 min with pt today reviewing HPI, reviewing relevant past history, doing exam, reviewing and discussing lab and imaging data, and formulating plans.  An After Visit Summary was printed and given to the patient.  FOLLOW UP: Return in about 4 weeks (around 09/28/2020) for routine chronic illness f/u AND f/u on monday 09/03/20 before 3PM to get your TB skin test read.  Signed:  PhiCrissie SicklesD           08/31/2020

## 2020-09-03 ENCOUNTER — Telehealth: Payer: Self-pay

## 2020-09-03 ENCOUNTER — Ambulatory Visit: Payer: Medicare HMO

## 2020-09-03 ENCOUNTER — Ambulatory Visit: Payer: Medicare HMO | Admitting: Endocrinology

## 2020-09-03 DIAGNOSIS — R488 Other symbolic dysfunctions: Secondary | ICD-10-CM | POA: Diagnosis not present

## 2020-09-03 DIAGNOSIS — R296 Repeated falls: Secondary | ICD-10-CM | POA: Diagnosis not present

## 2020-09-03 DIAGNOSIS — R41841 Cognitive communication deficit: Secondary | ICD-10-CM | POA: Diagnosis not present

## 2020-09-03 DIAGNOSIS — M6281 Muscle weakness (generalized): Secondary | ICD-10-CM | POA: Diagnosis not present

## 2020-09-03 LAB — TB SKIN TEST
Induration: 0 mm
TB Skin Test: NEGATIVE

## 2020-09-03 NOTE — Telephone Encounter (Signed)
FYI. Please see below.   Patient Name: Vanessa Gibson Gender: Female DOB: Feb 04, 1928 Age: 86 Y 10 M 10 D Return Phone Number: [phone] (Primary) Address: City/State/Zip: [place] [address] Client [facility] - Client Client Site [place] - Day Physician Crissie Sickles - MD Contact Type Call Who Is Calling Patient / Member / Family / Caregiver Call Type Triage / Clinical Caller Name Genia Hotter Relationship To Patient Care Giver Return Phone Number [phone] (Primary) Chief Complaint Unclassified Symptom Reason for Call Symptomatic / Request for [place] states that patient took 18 units for BS and normally is supposed to takes 3 units . BS has been running good. No current symptoms. Translation No Nurse Assessment Nurse: Hassell Done, RN, Joelene Millin Date/Time Eilene Ghazi Time): 09/01/2020 6:22:36 PM Confirm and document reason for call. If symptomatic, describe symptoms. ---Caller states that the patient accidentally took 18 units of novolog insulin for BS and normally is supposed to takes 3 units. current BS 168. No current symptoms. Does the patient have any new or worsening symptoms? ---No Guidelines Guideline Title Affirmed Question Affirmed Notes Nurse Date/Time (Eastern Time) Poisoning [1] DOUBLE DOSE (an extra dose or lesser amount) of prescription drug AND [2] any symptoms (e.g., dizziness, nausea, pain, sleepiness) Hassell Done, RN, Joelene Millin 09/01/2020 6:25:03 PM Disp. Time Eilene Ghazi Time) Disposition Final User 09/01/2020 6:27:43 PM Call [place] Now Yes Hassell Done, RN, Renea Ee Disagree/Comply Comply Caller Understands Yes PreDisposition Call Doctor Care Advice Given Per Guideline PLEASE NOTE: All timestamps contained within this report are represented as [place] Standard Time. CONFIDENTIALTY NOTICE: This fax transmission is intended only for the addressee. It contains information that is  legally privileged, confidential or otherwise protected from use or disclosure. If you are not the intended recipient, you are strictly prohibited from reviewing, disclosing, copying using or disseminating any of this information or taking any action in reliance on or regarding this information. If you have received this fax in error, please notify us immediately by telephone so that we can arrange for its return to [place]. Phone: [phone], Toll-Free: [phone], Fax: [phone] Page: 2 of 2 Call Id: [id number] Care Advice Given Per Guideline CALL POISON CENTER NOW: * You need to call the [facility] now. [facility] advice is a free service. [place] NUMBER: [place]: [phone] * This number will automatically connect you with your local poison center. CARE ADVICE given per Poisoning (Adult) guideline

## 2020-09-04 DIAGNOSIS — Z1159 Encounter for screening for other viral diseases: Secondary | ICD-10-CM | POA: Diagnosis not present

## 2020-09-04 DIAGNOSIS — R296 Repeated falls: Secondary | ICD-10-CM | POA: Diagnosis not present

## 2020-09-04 DIAGNOSIS — M6281 Muscle weakness (generalized): Secondary | ICD-10-CM | POA: Diagnosis not present

## 2020-09-04 DIAGNOSIS — Z20828 Contact with and (suspected) exposure to other viral communicable diseases: Secondary | ICD-10-CM | POA: Diagnosis not present

## 2020-09-04 DIAGNOSIS — R2681 Unsteadiness on feet: Secondary | ICD-10-CM | POA: Diagnosis not present

## 2020-09-04 NOTE — Telephone Encounter (Signed)
Spoke with patient and it was recommended that she drink a small cup of orange juice. Her recent blood sugar was 174. She states unable to sleep.

## 2020-09-04 NOTE — Telephone Encounter (Signed)
Pls call and see how pt is doing regarding insulin and blood sugars, ask what poison control center recommended they do pls.

## 2020-09-04 NOTE — Telephone Encounter (Signed)
Noted  

## 2020-09-05 ENCOUNTER — Telehealth: Payer: Self-pay | Admitting: Family Medicine

## 2020-09-05 DIAGNOSIS — M6281 Muscle weakness (generalized): Secondary | ICD-10-CM | POA: Diagnosis not present

## 2020-09-05 DIAGNOSIS — R296 Repeated falls: Secondary | ICD-10-CM | POA: Diagnosis not present

## 2020-09-05 NOTE — Telephone Encounter (Signed)
Forms on PCP desk.

## 2020-09-05 NOTE — Telephone Encounter (Signed)
[facility] forms received for aid/attendance. Once completed son requests that forms be emailed back. [facility] and [facility] have email information. Forms placed in bin for pick up.

## 2020-09-06 ENCOUNTER — Telehealth: Payer: Self-pay

## 2020-09-06 DIAGNOSIS — R2681 Unsteadiness on feet: Secondary | ICD-10-CM | POA: Diagnosis not present

## 2020-09-06 DIAGNOSIS — M6281 Muscle weakness (generalized): Secondary | ICD-10-CM | POA: Diagnosis not present

## 2020-09-06 DIAGNOSIS — R296 Repeated falls: Secondary | ICD-10-CM | POA: Diagnosis not present

## 2020-09-06 NOTE — Telephone Encounter (Signed)
Forms emailed to son and daughter of patient.

## 2020-09-06 NOTE — Telephone Encounter (Signed)
Received fax regarding final FL2 form completion, Placed on PCP desk to review and sign, if appropriate. Please place on my desk once done for fax completion

## 2020-09-07 ENCOUNTER — Other Ambulatory Visit: Payer: Self-pay | Admitting: Family Medicine

## 2020-09-07 DIAGNOSIS — R296 Repeated falls: Secondary | ICD-10-CM | POA: Diagnosis not present

## 2020-09-07 DIAGNOSIS — R2681 Unsteadiness on feet: Secondary | ICD-10-CM | POA: Diagnosis not present

## 2020-09-07 DIAGNOSIS — Z1159 Encounter for screening for other viral diseases: Secondary | ICD-10-CM | POA: Diagnosis not present

## 2020-09-07 DIAGNOSIS — Z20828 Contact with and (suspected) exposure to other viral communicable diseases: Secondary | ICD-10-CM | POA: Diagnosis not present

## 2020-09-07 DIAGNOSIS — M6281 Muscle weakness (generalized): Secondary | ICD-10-CM | POA: Diagnosis not present

## 2020-09-07 NOTE — Telephone Encounter (Signed)
The requested FL2 changes were made and signed. Signed:  Crissie Sickles, MD           09/07/2020

## 2020-09-08 DIAGNOSIS — M6281 Muscle weakness (generalized): Secondary | ICD-10-CM | POA: Diagnosis not present

## 2020-09-08 DIAGNOSIS — R296 Repeated falls: Secondary | ICD-10-CM | POA: Diagnosis not present

## 2020-09-10 ENCOUNTER — Other Ambulatory Visit: Payer: Self-pay | Admitting: Family Medicine

## 2020-09-10 DIAGNOSIS — R296 Repeated falls: Secondary | ICD-10-CM | POA: Diagnosis not present

## 2020-09-10 DIAGNOSIS — M6281 Muscle weakness (generalized): Secondary | ICD-10-CM | POA: Diagnosis not present

## 2020-09-11 ENCOUNTER — Other Ambulatory Visit: Payer: Self-pay

## 2020-09-11 ENCOUNTER — Telehealth: Payer: Self-pay | Admitting: Family Medicine

## 2020-09-11 DIAGNOSIS — R2681 Unsteadiness on feet: Secondary | ICD-10-CM | POA: Diagnosis not present

## 2020-09-11 DIAGNOSIS — M6281 Muscle weakness (generalized): Secondary | ICD-10-CM | POA: Diagnosis not present

## 2020-09-11 DIAGNOSIS — R296 Repeated falls: Secondary | ICD-10-CM | POA: Diagnosis not present

## 2020-09-11 DIAGNOSIS — Z20828 Contact with and (suspected) exposure to other viral communicable diseases: Secondary | ICD-10-CM | POA: Diagnosis not present

## 2020-09-11 DIAGNOSIS — Z1159 Encounter for screening for other viral diseases: Secondary | ICD-10-CM | POA: Diagnosis not present

## 2020-09-11 DIAGNOSIS — R41841 Cognitive communication deficit: Secondary | ICD-10-CM | POA: Diagnosis not present

## 2020-09-11 DIAGNOSIS — R488 Other symbolic dysfunctions: Secondary | ICD-10-CM | POA: Diagnosis not present

## 2020-09-11 NOTE — Telephone Encounter (Signed)
Patient's daughter advised and medicare qualifications rules read.

## 2020-09-11 NOTE — Telephone Encounter (Signed)
As of her last visit with me she does not qualify for this.  Per medicare rules, in order to qualifty: Patient must require repositioning of the body in ways that cannot be achieved with an ordinary bed or wedge pillow to eliminate pain, reduce pressure, or treat existence of pressure sores OR  patient must require head of the bed to be elevated at least 30 deg for most of the time OR patient requires frequent changes in body positions which cannot be achieved with a normal bed.  Signed:  Crissie Sickles, MD           09/11/2020

## 2020-09-11 NOTE — Telephone Encounter (Signed)
Patient's daughter called to request and RX for an adjustable bed. She states Dr. Anitra Lauth is already aware of the patient's situation. Please fax RX to [phone].

## 2020-09-11 NOTE — Telephone Encounter (Signed)
Patient was last seen on 10/8 regarding FL2 form needed for assisted living facility move. Family is now requesting Rx for adjustable bed.  Okay for this?

## 2020-09-12 DIAGNOSIS — R296 Repeated falls: Secondary | ICD-10-CM | POA: Diagnosis not present

## 2020-09-12 DIAGNOSIS — M6281 Muscle weakness (generalized): Secondary | ICD-10-CM | POA: Diagnosis not present

## 2020-09-12 DIAGNOSIS — R2681 Unsteadiness on feet: Secondary | ICD-10-CM | POA: Diagnosis not present

## 2020-09-13 DIAGNOSIS — M6281 Muscle weakness (generalized): Secondary | ICD-10-CM | POA: Diagnosis not present

## 2020-09-13 DIAGNOSIS — R296 Repeated falls: Secondary | ICD-10-CM | POA: Diagnosis not present

## 2020-09-13 DIAGNOSIS — R2681 Unsteadiness on feet: Secondary | ICD-10-CM | POA: Diagnosis not present

## 2020-09-13 DIAGNOSIS — R41841 Cognitive communication deficit: Secondary | ICD-10-CM | POA: Diagnosis not present

## 2020-09-13 DIAGNOSIS — R488 Other symbolic dysfunctions: Secondary | ICD-10-CM | POA: Diagnosis not present

## 2020-09-14 ENCOUNTER — Telehealth: Payer: Self-pay

## 2020-09-14 DIAGNOSIS — R41841 Cognitive communication deficit: Secondary | ICD-10-CM | POA: Diagnosis not present

## 2020-09-14 DIAGNOSIS — Z20828 Contact with and (suspected) exposure to other viral communicable diseases: Secondary | ICD-10-CM | POA: Diagnosis not present

## 2020-09-14 DIAGNOSIS — Z1159 Encounter for screening for other viral diseases: Secondary | ICD-10-CM | POA: Diagnosis not present

## 2020-09-14 DIAGNOSIS — M6281 Muscle weakness (generalized): Secondary | ICD-10-CM | POA: Diagnosis not present

## 2020-09-14 DIAGNOSIS — R488 Other symbolic dysfunctions: Secondary | ICD-10-CM | POA: Diagnosis not present

## 2020-09-14 DIAGNOSIS — R296 Repeated falls: Secondary | ICD-10-CM | POA: Diagnosis not present

## 2020-09-14 NOTE — Telephone Encounter (Signed)
Received forms from [facility] and [facility] regarding patient. Placed on PCP desk to review and sign, if appropriate.

## 2020-09-15 ENCOUNTER — Encounter: Payer: Self-pay | Admitting: Family Medicine

## 2020-09-15 NOTE — Telephone Encounter (Signed)
Signed and put in box to go up front. Signed:  Crissie Sickles, MD           09/15/2020

## 2020-09-17 DIAGNOSIS — R41841 Cognitive communication deficit: Secondary | ICD-10-CM | POA: Diagnosis not present

## 2020-09-17 DIAGNOSIS — R488 Other symbolic dysfunctions: Secondary | ICD-10-CM | POA: Diagnosis not present

## 2020-09-17 NOTE — Telephone Encounter (Signed)
Forms faxed back. Confirmation received and both sent to scan

## 2020-09-18 ENCOUNTER — Telehealth: Payer: Self-pay | Admitting: Family Medicine

## 2020-09-18 DIAGNOSIS — M6281 Muscle weakness (generalized): Secondary | ICD-10-CM | POA: Diagnosis not present

## 2020-09-18 DIAGNOSIS — R2681 Unsteadiness on feet: Secondary | ICD-10-CM | POA: Diagnosis not present

## 2020-09-18 DIAGNOSIS — Z20828 Contact with and (suspected) exposure to other viral communicable diseases: Secondary | ICD-10-CM | POA: Diagnosis not present

## 2020-09-18 DIAGNOSIS — R488 Other symbolic dysfunctions: Secondary | ICD-10-CM | POA: Diagnosis not present

## 2020-09-18 DIAGNOSIS — R296 Repeated falls: Secondary | ICD-10-CM | POA: Diagnosis not present

## 2020-09-18 DIAGNOSIS — Z1159 Encounter for screening for other viral diseases: Secondary | ICD-10-CM | POA: Diagnosis not present

## 2020-09-18 DIAGNOSIS — R41841 Cognitive communication deficit: Secondary | ICD-10-CM | POA: Diagnosis not present

## 2020-09-18 NOTE — Telephone Encounter (Signed)
Message via after hours service: Ruby with [facility] states she was told by patient's son/POA that Dr. Anitra Lauth ordered a psych eval for the patient. Ruby wants to know if this is true so she can request a copy of medical records. Ruby's call back number is [phone].

## 2020-09-18 NOTE — Telephone Encounter (Signed)
I did not see documented that psych evaluation ordered by you for patient. Please check behind me for clarification.

## 2020-09-18 NOTE — Telephone Encounter (Signed)
No I have not recommended a psych eval

## 2020-09-18 NOTE — Telephone Encounter (Signed)
Ruby returned call and I gave her Dr. Idelle Leech message that he had not recommended a psych eval. Ruby will call back if she has further questions.

## 2020-09-18 NOTE — Telephone Encounter (Signed)
LM for Ruby to return call.

## 2020-09-19 DIAGNOSIS — M6281 Muscle weakness (generalized): Secondary | ICD-10-CM | POA: Diagnosis not present

## 2020-09-19 DIAGNOSIS — R2681 Unsteadiness on feet: Secondary | ICD-10-CM | POA: Diagnosis not present

## 2020-09-19 DIAGNOSIS — R296 Repeated falls: Secondary | ICD-10-CM | POA: Diagnosis not present

## 2020-09-19 DIAGNOSIS — R488 Other symbolic dysfunctions: Secondary | ICD-10-CM | POA: Diagnosis not present

## 2020-09-19 DIAGNOSIS — R41841 Cognitive communication deficit: Secondary | ICD-10-CM | POA: Diagnosis not present

## 2020-09-20 ENCOUNTER — Encounter (INDEPENDENT_AMBULATORY_CARE_PROVIDER_SITE_OTHER): Payer: Medicare HMO | Admitting: Ophthalmology

## 2020-09-20 DIAGNOSIS — R2681 Unsteadiness on feet: Secondary | ICD-10-CM | POA: Diagnosis not present

## 2020-09-20 DIAGNOSIS — R296 Repeated falls: Secondary | ICD-10-CM | POA: Diagnosis not present

## 2020-09-20 DIAGNOSIS — M6281 Muscle weakness (generalized): Secondary | ICD-10-CM | POA: Diagnosis not present

## 2020-09-25 DIAGNOSIS — E119 Type 2 diabetes mellitus without complications: Secondary | ICD-10-CM | POA: Diagnosis not present

## 2020-09-25 DIAGNOSIS — F411 Generalized anxiety disorder: Secondary | ICD-10-CM | POA: Diagnosis not present

## 2020-09-25 DIAGNOSIS — F0390 Unspecified dementia without behavioral disturbance: Secondary | ICD-10-CM | POA: Diagnosis not present

## 2020-09-25 DIAGNOSIS — I2510 Atherosclerotic heart disease of native coronary artery without angina pectoris: Secondary | ICD-10-CM | POA: Diagnosis not present

## 2020-09-26 DIAGNOSIS — R54 Age-related physical debility: Secondary | ICD-10-CM | POA: Diagnosis not present

## 2020-09-26 DIAGNOSIS — Z8781 Personal history of (healed) traumatic fracture: Secondary | ICD-10-CM | POA: Diagnosis not present

## 2020-09-26 DIAGNOSIS — I2510 Atherosclerotic heart disease of native coronary artery without angina pectoris: Secondary | ICD-10-CM | POA: Diagnosis not present

## 2020-09-28 DIAGNOSIS — I2510 Atherosclerotic heart disease of native coronary artery without angina pectoris: Secondary | ICD-10-CM | POA: Diagnosis not present

## 2020-09-28 DIAGNOSIS — I10 Essential (primary) hypertension: Secondary | ICD-10-CM | POA: Diagnosis not present

## 2020-09-28 DIAGNOSIS — F411 Generalized anxiety disorder: Secondary | ICD-10-CM | POA: Diagnosis not present

## 2020-09-28 DIAGNOSIS — S7291XD Unspecified fracture of right femur, subsequent encounter for closed fracture with routine healing: Secondary | ICD-10-CM | POA: Diagnosis not present

## 2020-09-28 DIAGNOSIS — D649 Anemia, unspecified: Secondary | ICD-10-CM | POA: Diagnosis not present

## 2020-09-28 DIAGNOSIS — Z7982 Long term (current) use of aspirin: Secondary | ICD-10-CM | POA: Diagnosis not present

## 2020-09-28 DIAGNOSIS — F0390 Unspecified dementia without behavioral disturbance: Secondary | ICD-10-CM | POA: Diagnosis not present

## 2020-09-28 DIAGNOSIS — E119 Type 2 diabetes mellitus without complications: Secondary | ICD-10-CM | POA: Diagnosis not present

## 2020-09-28 DIAGNOSIS — Z9181 History of falling: Secondary | ICD-10-CM | POA: Diagnosis not present

## 2020-09-28 DIAGNOSIS — Z794 Long term (current) use of insulin: Secondary | ICD-10-CM | POA: Diagnosis not present

## 2020-09-30 DIAGNOSIS — Z4789 Encounter for other orthopedic aftercare: Secondary | ICD-10-CM | POA: Diagnosis not present

## 2020-10-01 ENCOUNTER — Ambulatory Visit: Payer: Medicare HMO | Admitting: Family Medicine

## 2020-10-03 DIAGNOSIS — S7291XD Unspecified fracture of right femur, subsequent encounter for closed fracture with routine healing: Secondary | ICD-10-CM | POA: Diagnosis not present

## 2020-10-03 DIAGNOSIS — I10 Essential (primary) hypertension: Secondary | ICD-10-CM | POA: Diagnosis not present

## 2020-10-03 DIAGNOSIS — F0390 Unspecified dementia without behavioral disturbance: Secondary | ICD-10-CM | POA: Diagnosis not present

## 2020-10-03 DIAGNOSIS — I2510 Atherosclerotic heart disease of native coronary artery without angina pectoris: Secondary | ICD-10-CM | POA: Diagnosis not present

## 2020-10-03 DIAGNOSIS — F411 Generalized anxiety disorder: Secondary | ICD-10-CM | POA: Diagnosis not present

## 2020-10-03 DIAGNOSIS — E119 Type 2 diabetes mellitus without complications: Secondary | ICD-10-CM | POA: Diagnosis not present

## 2020-10-03 DIAGNOSIS — Z7982 Long term (current) use of aspirin: Secondary | ICD-10-CM | POA: Diagnosis not present

## 2020-10-03 DIAGNOSIS — Z9181 History of falling: Secondary | ICD-10-CM | POA: Diagnosis not present

## 2020-10-03 DIAGNOSIS — Z794 Long term (current) use of insulin: Secondary | ICD-10-CM | POA: Diagnosis not present

## 2020-10-04 ENCOUNTER — Encounter (INDEPENDENT_AMBULATORY_CARE_PROVIDER_SITE_OTHER): Payer: Medicare HMO | Admitting: Ophthalmology

## 2020-10-06 DIAGNOSIS — Z7982 Long term (current) use of aspirin: Secondary | ICD-10-CM | POA: Diagnosis not present

## 2020-10-06 DIAGNOSIS — I10 Essential (primary) hypertension: Secondary | ICD-10-CM | POA: Diagnosis not present

## 2020-10-06 DIAGNOSIS — Z9181 History of falling: Secondary | ICD-10-CM | POA: Diagnosis not present

## 2020-10-06 DIAGNOSIS — I2510 Atherosclerotic heart disease of native coronary artery without angina pectoris: Secondary | ICD-10-CM | POA: Diagnosis not present

## 2020-10-06 DIAGNOSIS — F411 Generalized anxiety disorder: Secondary | ICD-10-CM | POA: Diagnosis not present

## 2020-10-06 DIAGNOSIS — F0390 Unspecified dementia without behavioral disturbance: Secondary | ICD-10-CM | POA: Diagnosis not present

## 2020-10-06 DIAGNOSIS — E119 Type 2 diabetes mellitus without complications: Secondary | ICD-10-CM | POA: Diagnosis not present

## 2020-10-06 DIAGNOSIS — S7291XD Unspecified fracture of right femur, subsequent encounter for closed fracture with routine healing: Secondary | ICD-10-CM | POA: Diagnosis not present

## 2020-10-06 DIAGNOSIS — Z794 Long term (current) use of insulin: Secondary | ICD-10-CM | POA: Diagnosis not present

## 2020-10-08 DIAGNOSIS — R54 Age-related physical debility: Secondary | ICD-10-CM | POA: Diagnosis not present

## 2020-10-08 DIAGNOSIS — F0390 Unspecified dementia without behavioral disturbance: Secondary | ICD-10-CM | POA: Diagnosis not present

## 2020-10-08 DIAGNOSIS — I2510 Atherosclerotic heart disease of native coronary artery without angina pectoris: Secondary | ICD-10-CM | POA: Diagnosis not present

## 2020-10-10 DIAGNOSIS — E119 Type 2 diabetes mellitus without complications: Secondary | ICD-10-CM | POA: Diagnosis not present

## 2020-10-10 DIAGNOSIS — I10 Essential (primary) hypertension: Secondary | ICD-10-CM | POA: Diagnosis not present

## 2020-10-10 DIAGNOSIS — Z9181 History of falling: Secondary | ICD-10-CM | POA: Diagnosis not present

## 2020-10-10 DIAGNOSIS — F411 Generalized anxiety disorder: Secondary | ICD-10-CM | POA: Diagnosis not present

## 2020-10-10 DIAGNOSIS — F0390 Unspecified dementia without behavioral disturbance: Secondary | ICD-10-CM | POA: Diagnosis not present

## 2020-10-10 DIAGNOSIS — S7291XD Unspecified fracture of right femur, subsequent encounter for closed fracture with routine healing: Secondary | ICD-10-CM | POA: Diagnosis not present

## 2020-10-10 DIAGNOSIS — Z7982 Long term (current) use of aspirin: Secondary | ICD-10-CM | POA: Diagnosis not present

## 2020-10-10 DIAGNOSIS — Z794 Long term (current) use of insulin: Secondary | ICD-10-CM | POA: Diagnosis not present

## 2020-10-10 DIAGNOSIS — I2510 Atherosclerotic heart disease of native coronary artery without angina pectoris: Secondary | ICD-10-CM | POA: Diagnosis not present

## 2020-10-11 ENCOUNTER — Encounter (INDEPENDENT_AMBULATORY_CARE_PROVIDER_SITE_OTHER): Payer: Medicare HMO | Admitting: Ophthalmology

## 2020-10-11 DIAGNOSIS — H353221 Exudative age-related macular degeneration, left eye, with active choroidal neovascularization: Secondary | ICD-10-CM | POA: Diagnosis not present

## 2020-10-11 DIAGNOSIS — H353211 Exudative age-related macular degeneration, right eye, with active choroidal neovascularization: Secondary | ICD-10-CM | POA: Diagnosis not present

## 2020-10-11 DIAGNOSIS — H903 Sensorineural hearing loss, bilateral: Secondary | ICD-10-CM | POA: Diagnosis not present

## 2020-10-11 DIAGNOSIS — H353231 Exudative age-related macular degeneration, bilateral, with active choroidal neovascularization: Secondary | ICD-10-CM | POA: Diagnosis not present

## 2020-10-12 DIAGNOSIS — Z9181 History of falling: Secondary | ICD-10-CM | POA: Diagnosis not present

## 2020-10-12 DIAGNOSIS — I10 Essential (primary) hypertension: Secondary | ICD-10-CM | POA: Diagnosis not present

## 2020-10-12 DIAGNOSIS — Z7982 Long term (current) use of aspirin: Secondary | ICD-10-CM | POA: Diagnosis not present

## 2020-10-12 DIAGNOSIS — S7291XD Unspecified fracture of right femur, subsequent encounter for closed fracture with routine healing: Secondary | ICD-10-CM | POA: Diagnosis not present

## 2020-10-12 DIAGNOSIS — E119 Type 2 diabetes mellitus without complications: Secondary | ICD-10-CM | POA: Diagnosis not present

## 2020-10-12 DIAGNOSIS — Z794 Long term (current) use of insulin: Secondary | ICD-10-CM | POA: Diagnosis not present

## 2020-10-12 DIAGNOSIS — F0390 Unspecified dementia without behavioral disturbance: Secondary | ICD-10-CM | POA: Diagnosis not present

## 2020-10-12 DIAGNOSIS — F411 Generalized anxiety disorder: Secondary | ICD-10-CM | POA: Diagnosis not present

## 2020-10-12 DIAGNOSIS — I2510 Atherosclerotic heart disease of native coronary artery without angina pectoris: Secondary | ICD-10-CM | POA: Diagnosis not present

## 2020-10-15 DIAGNOSIS — F411 Generalized anxiety disorder: Secondary | ICD-10-CM | POA: Diagnosis not present

## 2020-10-15 DIAGNOSIS — I10 Essential (primary) hypertension: Secondary | ICD-10-CM | POA: Diagnosis not present

## 2020-10-15 DIAGNOSIS — E119 Type 2 diabetes mellitus without complications: Secondary | ICD-10-CM | POA: Diagnosis not present

## 2020-10-15 DIAGNOSIS — F0390 Unspecified dementia without behavioral disturbance: Secondary | ICD-10-CM | POA: Diagnosis not present

## 2020-10-15 DIAGNOSIS — Z9181 History of falling: Secondary | ICD-10-CM | POA: Diagnosis not present

## 2020-10-15 DIAGNOSIS — Z7982 Long term (current) use of aspirin: Secondary | ICD-10-CM | POA: Diagnosis not present

## 2020-10-15 DIAGNOSIS — Z794 Long term (current) use of insulin: Secondary | ICD-10-CM | POA: Diagnosis not present

## 2020-10-15 DIAGNOSIS — S7291XD Unspecified fracture of right femur, subsequent encounter for closed fracture with routine healing: Secondary | ICD-10-CM | POA: Diagnosis not present

## 2020-10-15 DIAGNOSIS — I2510 Atherosclerotic heart disease of native coronary artery without angina pectoris: Secondary | ICD-10-CM | POA: Diagnosis not present

## 2020-10-16 DIAGNOSIS — Z8781 Personal history of (healed) traumatic fracture: Secondary | ICD-10-CM | POA: Diagnosis not present

## 2020-10-16 DIAGNOSIS — I2510 Atherosclerotic heart disease of native coronary artery without angina pectoris: Secondary | ICD-10-CM | POA: Diagnosis not present

## 2020-10-16 DIAGNOSIS — R54 Age-related physical debility: Secondary | ICD-10-CM | POA: Diagnosis not present

## 2020-10-17 DIAGNOSIS — F0390 Unspecified dementia without behavioral disturbance: Secondary | ICD-10-CM | POA: Diagnosis not present

## 2020-10-17 DIAGNOSIS — Z794 Long term (current) use of insulin: Secondary | ICD-10-CM | POA: Diagnosis not present

## 2020-10-17 DIAGNOSIS — F411 Generalized anxiety disorder: Secondary | ICD-10-CM | POA: Diagnosis not present

## 2020-10-17 DIAGNOSIS — E119 Type 2 diabetes mellitus without complications: Secondary | ICD-10-CM | POA: Diagnosis not present

## 2020-10-17 DIAGNOSIS — I10 Essential (primary) hypertension: Secondary | ICD-10-CM | POA: Diagnosis not present

## 2020-10-17 DIAGNOSIS — I2510 Atherosclerotic heart disease of native coronary artery without angina pectoris: Secondary | ICD-10-CM | POA: Diagnosis not present

## 2020-10-17 DIAGNOSIS — Z7982 Long term (current) use of aspirin: Secondary | ICD-10-CM | POA: Diagnosis not present

## 2020-10-17 DIAGNOSIS — S7291XD Unspecified fracture of right femur, subsequent encounter for closed fracture with routine healing: Secondary | ICD-10-CM | POA: Diagnosis not present

## 2020-10-17 DIAGNOSIS — Z9181 History of falling: Secondary | ICD-10-CM | POA: Diagnosis not present

## 2020-10-22 DIAGNOSIS — H353221 Exudative age-related macular degeneration, left eye, with active choroidal neovascularization: Secondary | ICD-10-CM | POA: Diagnosis not present

## 2020-10-23 DIAGNOSIS — F411 Generalized anxiety disorder: Secondary | ICD-10-CM | POA: Diagnosis not present

## 2020-10-23 DIAGNOSIS — Z794 Long term (current) use of insulin: Secondary | ICD-10-CM | POA: Diagnosis not present

## 2020-10-23 DIAGNOSIS — F0390 Unspecified dementia without behavioral disturbance: Secondary | ICD-10-CM | POA: Diagnosis not present

## 2020-10-23 DIAGNOSIS — Z7982 Long term (current) use of aspirin: Secondary | ICD-10-CM | POA: Diagnosis not present

## 2020-10-23 DIAGNOSIS — S7291XD Unspecified fracture of right femur, subsequent encounter for closed fracture with routine healing: Secondary | ICD-10-CM | POA: Diagnosis not present

## 2020-10-23 DIAGNOSIS — E119 Type 2 diabetes mellitus without complications: Secondary | ICD-10-CM | POA: Diagnosis not present

## 2020-10-23 DIAGNOSIS — I2510 Atherosclerotic heart disease of native coronary artery without angina pectoris: Secondary | ICD-10-CM | POA: Diagnosis not present

## 2020-10-23 DIAGNOSIS — Z9181 History of falling: Secondary | ICD-10-CM | POA: Diagnosis not present

## 2020-10-23 DIAGNOSIS — I10 Essential (primary) hypertension: Secondary | ICD-10-CM | POA: Diagnosis not present

## 2020-10-30 ENCOUNTER — Telehealth: Payer: Self-pay

## 2020-10-30 NOTE — Telephone Encounter (Signed)
Phone not was already created regarding subject matter.  A user error has taken place: encounter opened in error, closed for administrative reasons

## 2020-10-30 NOTE — Telephone Encounter (Signed)
Faxed paperwork to [facility]

## 2020-10-30 NOTE — Telephone Encounter (Signed)
Form was incomplete and faxed back to [place].  Please see section  17C COMPLETE DIAGNOSIS (page 2).  Please complete and contact son of patient, Vanessa Gibson when completed. [phone].   Thank you  Gave form to [place].12/7

## 2020-10-30 NOTE — Telephone Encounter (Signed)
Placed on PCP desk to review and sign, if appropriate.  

## 2020-10-30 NOTE — Telephone Encounter (Signed)
Paperwork completed, signed and put on Britt's keyboard. Signed:  Crissie Sickles, MD           10/30/2020

## 2020-11-01 NOTE — Telephone Encounter (Signed)
Verified that son Vanessa Gibson) was aware that forms were completed and faxed

## 2020-11-28 DIAGNOSIS — Z794 Long term (current) use of insulin: Secondary | ICD-10-CM | POA: Diagnosis not present

## 2020-11-28 DIAGNOSIS — E119 Type 2 diabetes mellitus without complications: Secondary | ICD-10-CM | POA: Diagnosis not present

## 2020-11-28 DIAGNOSIS — Z9181 History of falling: Secondary | ICD-10-CM | POA: Diagnosis not present

## 2020-11-28 DIAGNOSIS — F0390 Unspecified dementia without behavioral disturbance: Secondary | ICD-10-CM | POA: Diagnosis not present

## 2020-11-28 DIAGNOSIS — I2510 Atherosclerotic heart disease of native coronary artery without angina pectoris: Secondary | ICD-10-CM | POA: Diagnosis not present

## 2020-11-28 DIAGNOSIS — Z7982 Long term (current) use of aspirin: Secondary | ICD-10-CM | POA: Diagnosis not present

## 2020-11-28 DIAGNOSIS — F411 Generalized anxiety disorder: Secondary | ICD-10-CM | POA: Diagnosis not present

## 2020-11-28 DIAGNOSIS — I10 Essential (primary) hypertension: Secondary | ICD-10-CM | POA: Diagnosis not present

## 2020-11-28 DIAGNOSIS — S7291XD Unspecified fracture of right femur, subsequent encounter for closed fracture with routine healing: Secondary | ICD-10-CM | POA: Diagnosis not present

## 2020-11-30 DIAGNOSIS — Z9181 History of falling: Secondary | ICD-10-CM | POA: Diagnosis not present

## 2020-11-30 DIAGNOSIS — F411 Generalized anxiety disorder: Secondary | ICD-10-CM | POA: Diagnosis not present

## 2020-11-30 DIAGNOSIS — E119 Type 2 diabetes mellitus without complications: Secondary | ICD-10-CM | POA: Diagnosis not present

## 2020-11-30 DIAGNOSIS — I2510 Atherosclerotic heart disease of native coronary artery without angina pectoris: Secondary | ICD-10-CM | POA: Diagnosis not present

## 2020-11-30 DIAGNOSIS — F0390 Unspecified dementia without behavioral disturbance: Secondary | ICD-10-CM | POA: Diagnosis not present

## 2020-11-30 DIAGNOSIS — Z7982 Long term (current) use of aspirin: Secondary | ICD-10-CM | POA: Diagnosis not present

## 2020-11-30 DIAGNOSIS — S7291XD Unspecified fracture of right femur, subsequent encounter for closed fracture with routine healing: Secondary | ICD-10-CM | POA: Diagnosis not present

## 2020-11-30 DIAGNOSIS — Z794 Long term (current) use of insulin: Secondary | ICD-10-CM | POA: Diagnosis not present

## 2020-11-30 DIAGNOSIS — I10 Essential (primary) hypertension: Secondary | ICD-10-CM | POA: Diagnosis not present

## 2020-12-03 DIAGNOSIS — I2510 Atherosclerotic heart disease of native coronary artery without angina pectoris: Secondary | ICD-10-CM | POA: Diagnosis not present

## 2020-12-03 DIAGNOSIS — F411 Generalized anxiety disorder: Secondary | ICD-10-CM | POA: Diagnosis not present

## 2020-12-03 DIAGNOSIS — Z7982 Long term (current) use of aspirin: Secondary | ICD-10-CM | POA: Diagnosis not present

## 2020-12-03 DIAGNOSIS — S7291XD Unspecified fracture of right femur, subsequent encounter for closed fracture with routine healing: Secondary | ICD-10-CM | POA: Diagnosis not present

## 2020-12-03 DIAGNOSIS — Z794 Long term (current) use of insulin: Secondary | ICD-10-CM | POA: Diagnosis not present

## 2020-12-03 DIAGNOSIS — E119 Type 2 diabetes mellitus without complications: Secondary | ICD-10-CM | POA: Diagnosis not present

## 2020-12-03 DIAGNOSIS — Z9181 History of falling: Secondary | ICD-10-CM | POA: Diagnosis not present

## 2020-12-03 DIAGNOSIS — F0390 Unspecified dementia without behavioral disturbance: Secondary | ICD-10-CM | POA: Diagnosis not present

## 2020-12-03 DIAGNOSIS — I10 Essential (primary) hypertension: Secondary | ICD-10-CM | POA: Diagnosis not present

## 2020-12-05 DIAGNOSIS — E119 Type 2 diabetes mellitus without complications: Secondary | ICD-10-CM | POA: Diagnosis not present

## 2020-12-05 DIAGNOSIS — Z7982 Long term (current) use of aspirin: Secondary | ICD-10-CM | POA: Diagnosis not present

## 2020-12-05 DIAGNOSIS — F411 Generalized anxiety disorder: Secondary | ICD-10-CM | POA: Diagnosis not present

## 2020-12-05 DIAGNOSIS — F0390 Unspecified dementia without behavioral disturbance: Secondary | ICD-10-CM | POA: Diagnosis not present

## 2020-12-05 DIAGNOSIS — S7291XD Unspecified fracture of right femur, subsequent encounter for closed fracture with routine healing: Secondary | ICD-10-CM | POA: Diagnosis not present

## 2020-12-05 DIAGNOSIS — Z9181 History of falling: Secondary | ICD-10-CM | POA: Diagnosis not present

## 2020-12-05 DIAGNOSIS — Z794 Long term (current) use of insulin: Secondary | ICD-10-CM | POA: Diagnosis not present

## 2020-12-05 DIAGNOSIS — I10 Essential (primary) hypertension: Secondary | ICD-10-CM | POA: Diagnosis not present

## 2020-12-05 DIAGNOSIS — I2510 Atherosclerotic heart disease of native coronary artery without angina pectoris: Secondary | ICD-10-CM | POA: Diagnosis not present

## 2020-12-11 DIAGNOSIS — I2510 Atherosclerotic heart disease of native coronary artery without angina pectoris: Secondary | ICD-10-CM | POA: Diagnosis not present

## 2020-12-11 DIAGNOSIS — S7291XD Unspecified fracture of right femur, subsequent encounter for closed fracture with routine healing: Secondary | ICD-10-CM | POA: Diagnosis not present

## 2020-12-11 DIAGNOSIS — E119 Type 2 diabetes mellitus without complications: Secondary | ICD-10-CM | POA: Diagnosis not present

## 2020-12-11 DIAGNOSIS — F0390 Unspecified dementia without behavioral disturbance: Secondary | ICD-10-CM | POA: Diagnosis not present

## 2020-12-11 DIAGNOSIS — Z7982 Long term (current) use of aspirin: Secondary | ICD-10-CM | POA: Diagnosis not present

## 2020-12-11 DIAGNOSIS — Z9181 History of falling: Secondary | ICD-10-CM | POA: Diagnosis not present

## 2020-12-11 DIAGNOSIS — Z794 Long term (current) use of insulin: Secondary | ICD-10-CM | POA: Diagnosis not present

## 2020-12-11 DIAGNOSIS — F411 Generalized anxiety disorder: Secondary | ICD-10-CM | POA: Diagnosis not present

## 2020-12-11 DIAGNOSIS — I10 Essential (primary) hypertension: Secondary | ICD-10-CM | POA: Diagnosis not present

## 2020-12-12 DIAGNOSIS — Z794 Long term (current) use of insulin: Secondary | ICD-10-CM | POA: Diagnosis not present

## 2020-12-12 DIAGNOSIS — F0390 Unspecified dementia without behavioral disturbance: Secondary | ICD-10-CM | POA: Diagnosis not present

## 2020-12-12 DIAGNOSIS — E119 Type 2 diabetes mellitus without complications: Secondary | ICD-10-CM | POA: Diagnosis not present

## 2020-12-12 DIAGNOSIS — I10 Essential (primary) hypertension: Secondary | ICD-10-CM | POA: Diagnosis not present

## 2020-12-12 DIAGNOSIS — I2510 Atherosclerotic heart disease of native coronary artery without angina pectoris: Secondary | ICD-10-CM | POA: Diagnosis not present

## 2020-12-12 DIAGNOSIS — Z7982 Long term (current) use of aspirin: Secondary | ICD-10-CM | POA: Diagnosis not present

## 2020-12-12 DIAGNOSIS — Z9181 History of falling: Secondary | ICD-10-CM | POA: Diagnosis not present

## 2020-12-12 DIAGNOSIS — F411 Generalized anxiety disorder: Secondary | ICD-10-CM | POA: Diagnosis not present

## 2020-12-12 DIAGNOSIS — S7291XD Unspecified fracture of right femur, subsequent encounter for closed fracture with routine healing: Secondary | ICD-10-CM | POA: Diagnosis not present

## 2020-12-13 DIAGNOSIS — F411 Generalized anxiety disorder: Secondary | ICD-10-CM | POA: Diagnosis not present

## 2020-12-13 DIAGNOSIS — I2510 Atherosclerotic heart disease of native coronary artery without angina pectoris: Secondary | ICD-10-CM | POA: Diagnosis not present

## 2020-12-13 DIAGNOSIS — F0390 Unspecified dementia without behavioral disturbance: Secondary | ICD-10-CM | POA: Diagnosis not present

## 2020-12-13 DIAGNOSIS — S7291XD Unspecified fracture of right femur, subsequent encounter for closed fracture with routine healing: Secondary | ICD-10-CM | POA: Diagnosis not present

## 2020-12-13 DIAGNOSIS — E119 Type 2 diabetes mellitus without complications: Secondary | ICD-10-CM | POA: Diagnosis not present

## 2020-12-13 DIAGNOSIS — Z9181 History of falling: Secondary | ICD-10-CM | POA: Diagnosis not present

## 2020-12-13 DIAGNOSIS — Z7982 Long term (current) use of aspirin: Secondary | ICD-10-CM | POA: Diagnosis not present

## 2020-12-13 DIAGNOSIS — I10 Essential (primary) hypertension: Secondary | ICD-10-CM | POA: Diagnosis not present

## 2020-12-13 DIAGNOSIS — Z794 Long term (current) use of insulin: Secondary | ICD-10-CM | POA: Diagnosis not present

## 2020-12-19 DIAGNOSIS — I10 Essential (primary) hypertension: Secondary | ICD-10-CM | POA: Diagnosis not present

## 2020-12-19 DIAGNOSIS — Z7982 Long term (current) use of aspirin: Secondary | ICD-10-CM | POA: Diagnosis not present

## 2020-12-19 DIAGNOSIS — F0390 Unspecified dementia without behavioral disturbance: Secondary | ICD-10-CM | POA: Diagnosis not present

## 2020-12-19 DIAGNOSIS — F411 Generalized anxiety disorder: Secondary | ICD-10-CM | POA: Diagnosis not present

## 2020-12-19 DIAGNOSIS — Z9181 History of falling: Secondary | ICD-10-CM | POA: Diagnosis not present

## 2020-12-19 DIAGNOSIS — E119 Type 2 diabetes mellitus without complications: Secondary | ICD-10-CM | POA: Diagnosis not present

## 2020-12-19 DIAGNOSIS — I2510 Atherosclerotic heart disease of native coronary artery without angina pectoris: Secondary | ICD-10-CM | POA: Diagnosis not present

## 2020-12-19 DIAGNOSIS — S7291XD Unspecified fracture of right femur, subsequent encounter for closed fracture with routine healing: Secondary | ICD-10-CM | POA: Diagnosis not present

## 2020-12-19 DIAGNOSIS — Z794 Long term (current) use of insulin: Secondary | ICD-10-CM | POA: Diagnosis not present

## 2020-12-21 DIAGNOSIS — I2510 Atherosclerotic heart disease of native coronary artery without angina pectoris: Secondary | ICD-10-CM | POA: Diagnosis not present

## 2020-12-21 DIAGNOSIS — Z9181 History of falling: Secondary | ICD-10-CM | POA: Diagnosis not present

## 2020-12-21 DIAGNOSIS — F411 Generalized anxiety disorder: Secondary | ICD-10-CM | POA: Diagnosis not present

## 2020-12-21 DIAGNOSIS — S7291XD Unspecified fracture of right femur, subsequent encounter for closed fracture with routine healing: Secondary | ICD-10-CM | POA: Diagnosis not present

## 2020-12-21 DIAGNOSIS — F0390 Unspecified dementia without behavioral disturbance: Secondary | ICD-10-CM | POA: Diagnosis not present

## 2020-12-21 DIAGNOSIS — E119 Type 2 diabetes mellitus without complications: Secondary | ICD-10-CM | POA: Diagnosis not present

## 2020-12-21 DIAGNOSIS — Z7982 Long term (current) use of aspirin: Secondary | ICD-10-CM | POA: Diagnosis not present

## 2020-12-21 DIAGNOSIS — Z794 Long term (current) use of insulin: Secondary | ICD-10-CM | POA: Diagnosis not present

## 2020-12-21 DIAGNOSIS — I10 Essential (primary) hypertension: Secondary | ICD-10-CM | POA: Diagnosis not present

## 2020-12-24 DIAGNOSIS — E119 Type 2 diabetes mellitus without complications: Secondary | ICD-10-CM | POA: Diagnosis not present

## 2020-12-24 DIAGNOSIS — I2510 Atherosclerotic heart disease of native coronary artery without angina pectoris: Secondary | ICD-10-CM | POA: Diagnosis not present

## 2020-12-24 DIAGNOSIS — Z794 Long term (current) use of insulin: Secondary | ICD-10-CM | POA: Diagnosis not present

## 2020-12-24 DIAGNOSIS — S7291XD Unspecified fracture of right femur, subsequent encounter for closed fracture with routine healing: Secondary | ICD-10-CM | POA: Diagnosis not present

## 2020-12-24 DIAGNOSIS — F0390 Unspecified dementia without behavioral disturbance: Secondary | ICD-10-CM | POA: Diagnosis not present

## 2020-12-24 DIAGNOSIS — Z7982 Long term (current) use of aspirin: Secondary | ICD-10-CM | POA: Diagnosis not present

## 2020-12-24 DIAGNOSIS — F411 Generalized anxiety disorder: Secondary | ICD-10-CM | POA: Diagnosis not present

## 2020-12-24 DIAGNOSIS — Z9181 History of falling: Secondary | ICD-10-CM | POA: Diagnosis not present

## 2020-12-24 DIAGNOSIS — I10 Essential (primary) hypertension: Secondary | ICD-10-CM | POA: Diagnosis not present

## 2020-12-28 DIAGNOSIS — Z794 Long term (current) use of insulin: Secondary | ICD-10-CM | POA: Diagnosis not present

## 2020-12-28 DIAGNOSIS — Z9181 History of falling: Secondary | ICD-10-CM | POA: Diagnosis not present

## 2020-12-28 DIAGNOSIS — F411 Generalized anxiety disorder: Secondary | ICD-10-CM | POA: Diagnosis not present

## 2020-12-28 DIAGNOSIS — I10 Essential (primary) hypertension: Secondary | ICD-10-CM | POA: Diagnosis not present

## 2020-12-28 DIAGNOSIS — S7291XD Unspecified fracture of right femur, subsequent encounter for closed fracture with routine healing: Secondary | ICD-10-CM | POA: Diagnosis not present

## 2020-12-28 DIAGNOSIS — F0390 Unspecified dementia without behavioral disturbance: Secondary | ICD-10-CM | POA: Diagnosis not present

## 2020-12-28 DIAGNOSIS — E119 Type 2 diabetes mellitus without complications: Secondary | ICD-10-CM | POA: Diagnosis not present

## 2020-12-28 DIAGNOSIS — I2510 Atherosclerotic heart disease of native coronary artery without angina pectoris: Secondary | ICD-10-CM | POA: Diagnosis not present

## 2020-12-28 DIAGNOSIS — Z7982 Long term (current) use of aspirin: Secondary | ICD-10-CM | POA: Diagnosis not present

## 2021-01-07 DIAGNOSIS — F411 Generalized anxiety disorder: Secondary | ICD-10-CM | POA: Diagnosis not present

## 2021-01-07 DIAGNOSIS — S7291XD Unspecified fracture of right femur, subsequent encounter for closed fracture with routine healing: Secondary | ICD-10-CM | POA: Diagnosis not present

## 2021-01-07 DIAGNOSIS — I10 Essential (primary) hypertension: Secondary | ICD-10-CM | POA: Diagnosis not present

## 2021-01-07 DIAGNOSIS — Z794 Long term (current) use of insulin: Secondary | ICD-10-CM | POA: Diagnosis not present

## 2021-01-07 DIAGNOSIS — Z7982 Long term (current) use of aspirin: Secondary | ICD-10-CM | POA: Diagnosis not present

## 2021-01-07 DIAGNOSIS — F0390 Unspecified dementia without behavioral disturbance: Secondary | ICD-10-CM | POA: Diagnosis not present

## 2021-01-07 DIAGNOSIS — I2510 Atherosclerotic heart disease of native coronary artery without angina pectoris: Secondary | ICD-10-CM | POA: Diagnosis not present

## 2021-01-07 DIAGNOSIS — Z9181 History of falling: Secondary | ICD-10-CM | POA: Diagnosis not present

## 2021-01-07 DIAGNOSIS — E119 Type 2 diabetes mellitus without complications: Secondary | ICD-10-CM | POA: Diagnosis not present

## 2021-01-08 DIAGNOSIS — E119 Type 2 diabetes mellitus without complications: Secondary | ICD-10-CM | POA: Diagnosis not present

## 2021-01-08 DIAGNOSIS — I10 Essential (primary) hypertension: Secondary | ICD-10-CM | POA: Diagnosis not present

## 2021-01-08 DIAGNOSIS — I2510 Atherosclerotic heart disease of native coronary artery without angina pectoris: Secondary | ICD-10-CM | POA: Diagnosis not present

## 2021-01-08 DIAGNOSIS — Z794 Long term (current) use of insulin: Secondary | ICD-10-CM | POA: Diagnosis not present

## 2021-01-08 DIAGNOSIS — F411 Generalized anxiety disorder: Secondary | ICD-10-CM | POA: Diagnosis not present

## 2021-01-08 DIAGNOSIS — Z7982 Long term (current) use of aspirin: Secondary | ICD-10-CM | POA: Diagnosis not present

## 2021-01-08 DIAGNOSIS — Z9181 History of falling: Secondary | ICD-10-CM | POA: Diagnosis not present

## 2021-01-08 DIAGNOSIS — S7291XD Unspecified fracture of right femur, subsequent encounter for closed fracture with routine healing: Secondary | ICD-10-CM | POA: Diagnosis not present

## 2021-01-08 DIAGNOSIS — F0390 Unspecified dementia without behavioral disturbance: Secondary | ICD-10-CM | POA: Diagnosis not present

## 2021-01-11 DIAGNOSIS — S7291XD Unspecified fracture of right femur, subsequent encounter for closed fracture with routine healing: Secondary | ICD-10-CM | POA: Diagnosis not present

## 2021-01-11 DIAGNOSIS — Z7982 Long term (current) use of aspirin: Secondary | ICD-10-CM | POA: Diagnosis not present

## 2021-01-11 DIAGNOSIS — I2510 Atherosclerotic heart disease of native coronary artery without angina pectoris: Secondary | ICD-10-CM | POA: Diagnosis not present

## 2021-01-11 DIAGNOSIS — F0390 Unspecified dementia without behavioral disturbance: Secondary | ICD-10-CM | POA: Diagnosis not present

## 2021-01-11 DIAGNOSIS — F411 Generalized anxiety disorder: Secondary | ICD-10-CM | POA: Diagnosis not present

## 2021-01-11 DIAGNOSIS — Z9181 History of falling: Secondary | ICD-10-CM | POA: Diagnosis not present

## 2021-01-11 DIAGNOSIS — Z794 Long term (current) use of insulin: Secondary | ICD-10-CM | POA: Diagnosis not present

## 2021-01-11 DIAGNOSIS — E119 Type 2 diabetes mellitus without complications: Secondary | ICD-10-CM | POA: Diagnosis not present

## 2021-01-11 DIAGNOSIS — I10 Essential (primary) hypertension: Secondary | ICD-10-CM | POA: Diagnosis not present

## 2021-01-15 DIAGNOSIS — I10 Essential (primary) hypertension: Secondary | ICD-10-CM | POA: Diagnosis not present

## 2021-01-15 DIAGNOSIS — Z9181 History of falling: Secondary | ICD-10-CM | POA: Diagnosis not present

## 2021-01-15 DIAGNOSIS — I2510 Atherosclerotic heart disease of native coronary artery without angina pectoris: Secondary | ICD-10-CM | POA: Diagnosis not present

## 2021-01-15 DIAGNOSIS — Z794 Long term (current) use of insulin: Secondary | ICD-10-CM | POA: Diagnosis not present

## 2021-01-15 DIAGNOSIS — S7291XD Unspecified fracture of right femur, subsequent encounter for closed fracture with routine healing: Secondary | ICD-10-CM | POA: Diagnosis not present

## 2021-01-15 DIAGNOSIS — F0390 Unspecified dementia without behavioral disturbance: Secondary | ICD-10-CM | POA: Diagnosis not present

## 2021-01-15 DIAGNOSIS — Z7982 Long term (current) use of aspirin: Secondary | ICD-10-CM | POA: Diagnosis not present

## 2021-01-15 DIAGNOSIS — E119 Type 2 diabetes mellitus without complications: Secondary | ICD-10-CM | POA: Diagnosis not present

## 2021-01-15 DIAGNOSIS — F411 Generalized anxiety disorder: Secondary | ICD-10-CM | POA: Diagnosis not present

## 2021-01-17 DIAGNOSIS — Z9181 History of falling: Secondary | ICD-10-CM | POA: Diagnosis not present

## 2021-01-17 DIAGNOSIS — F0390 Unspecified dementia without behavioral disturbance: Secondary | ICD-10-CM | POA: Diagnosis not present

## 2021-01-17 DIAGNOSIS — Z7982 Long term (current) use of aspirin: Secondary | ICD-10-CM | POA: Diagnosis not present

## 2021-01-17 DIAGNOSIS — Z794 Long term (current) use of insulin: Secondary | ICD-10-CM | POA: Diagnosis not present

## 2021-01-17 DIAGNOSIS — I10 Essential (primary) hypertension: Secondary | ICD-10-CM | POA: Diagnosis not present

## 2021-01-17 DIAGNOSIS — E119 Type 2 diabetes mellitus without complications: Secondary | ICD-10-CM | POA: Diagnosis not present

## 2021-01-17 DIAGNOSIS — F411 Generalized anxiety disorder: Secondary | ICD-10-CM | POA: Diagnosis not present

## 2021-01-17 DIAGNOSIS — S7291XD Unspecified fracture of right femur, subsequent encounter for closed fracture with routine healing: Secondary | ICD-10-CM | POA: Diagnosis not present

## 2021-01-17 DIAGNOSIS — I2510 Atherosclerotic heart disease of native coronary artery without angina pectoris: Secondary | ICD-10-CM | POA: Diagnosis not present

## 2021-01-18 IMAGING — CR DG CHEST 1V
1 series · 1 of 1 positions shown · non-contrast
Comparison: Chest radiograph dated 04/20/2018.

CLINICAL DATA: 89-year-old female with fall and right hip pain.

EXAM:
CHEST  1 VIEW

[chest ap]
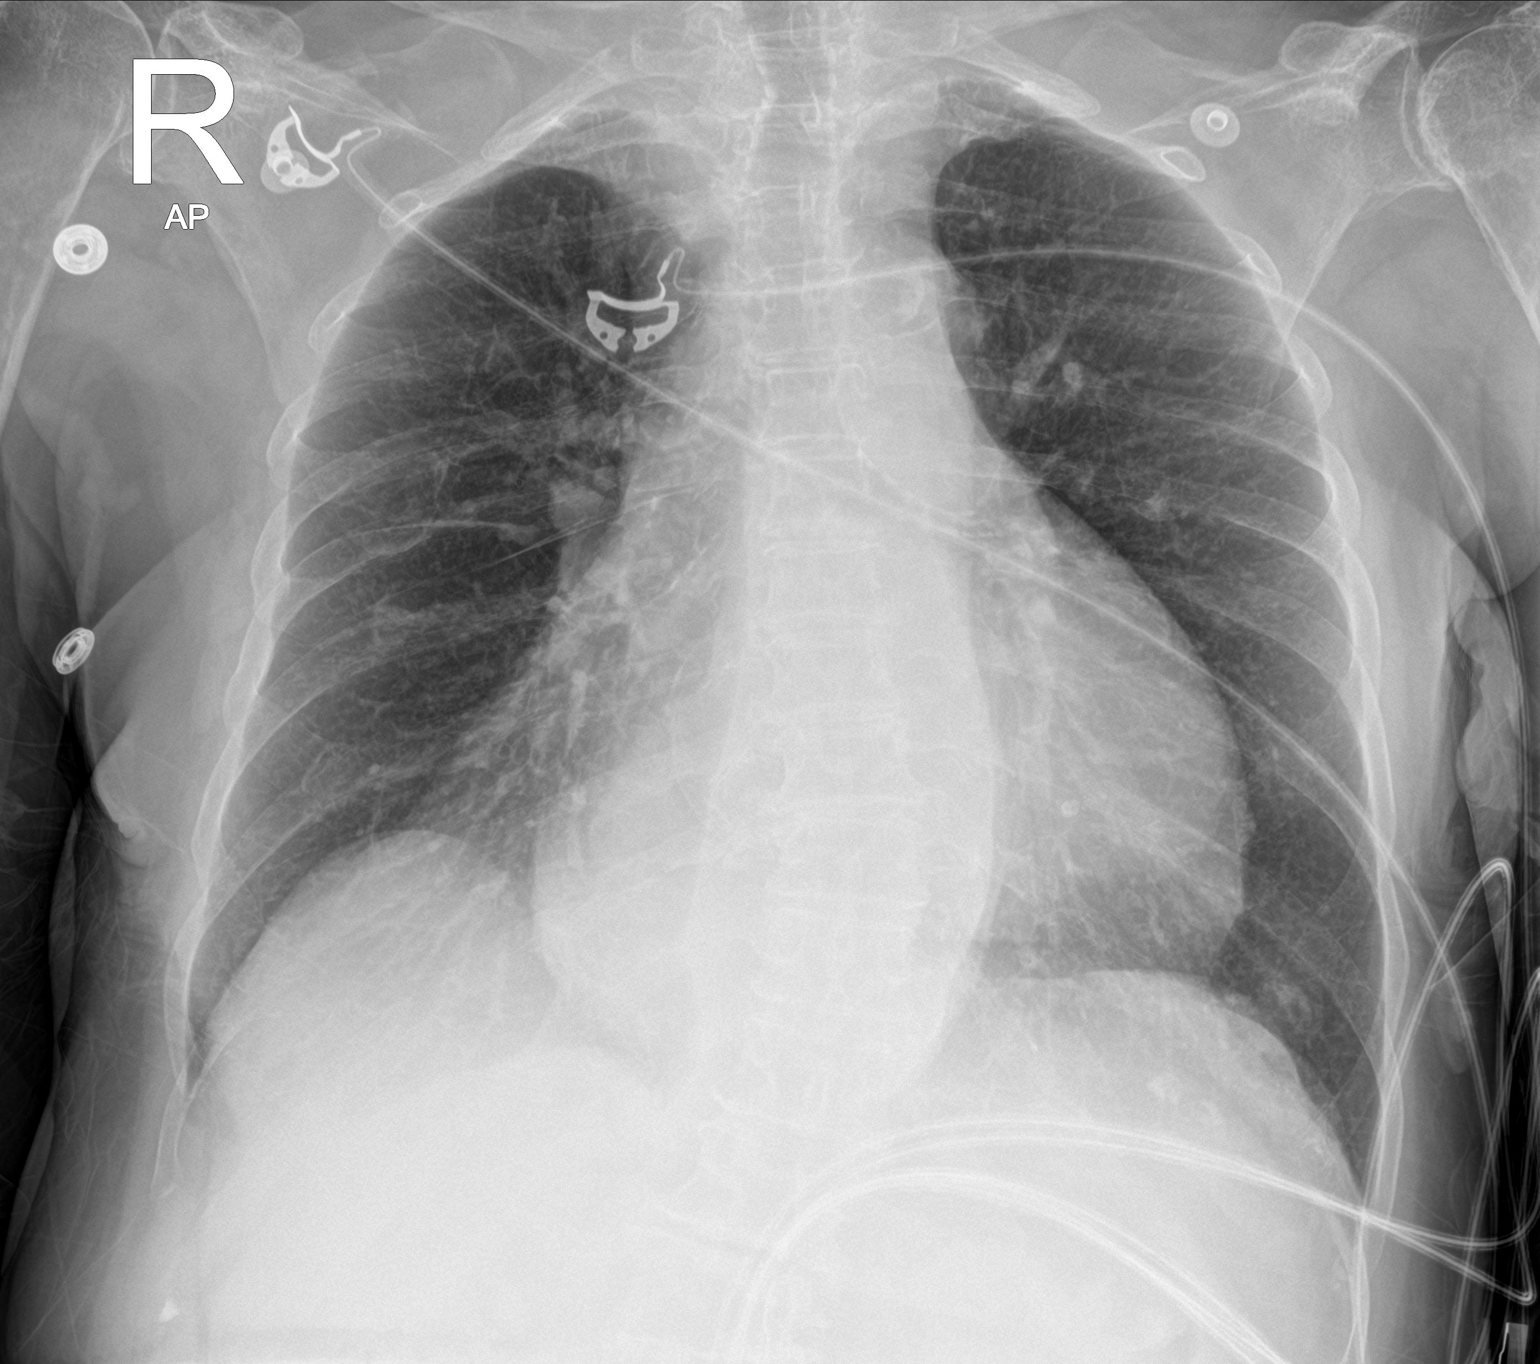

[1 of 1 positions shown; findings below may reference images not displayed]

FINDINGS: No focal consolidation, pleural effusion, or pneumothorax. There is
mild cardiomegaly. Atherosclerotic calcification of the aorta. No
acute osseous pathology.
IMPRESSION: No acute cardiopulmonary process.

## 2021-01-19 DIAGNOSIS — Z7982 Long term (current) use of aspirin: Secondary | ICD-10-CM | POA: Diagnosis not present

## 2021-01-19 DIAGNOSIS — Z794 Long term (current) use of insulin: Secondary | ICD-10-CM | POA: Diagnosis not present

## 2021-01-19 DIAGNOSIS — I2510 Atherosclerotic heart disease of native coronary artery without angina pectoris: Secondary | ICD-10-CM | POA: Diagnosis not present

## 2021-01-19 DIAGNOSIS — I10 Essential (primary) hypertension: Secondary | ICD-10-CM | POA: Diagnosis not present

## 2021-01-19 DIAGNOSIS — E119 Type 2 diabetes mellitus without complications: Secondary | ICD-10-CM | POA: Diagnosis not present

## 2021-01-19 DIAGNOSIS — Z9181 History of falling: Secondary | ICD-10-CM | POA: Diagnosis not present

## 2021-01-19 DIAGNOSIS — S7291XD Unspecified fracture of right femur, subsequent encounter for closed fracture with routine healing: Secondary | ICD-10-CM | POA: Diagnosis not present

## 2021-01-19 DIAGNOSIS — F411 Generalized anxiety disorder: Secondary | ICD-10-CM | POA: Diagnosis not present

## 2021-01-19 DIAGNOSIS — F0390 Unspecified dementia without behavioral disturbance: Secondary | ICD-10-CM | POA: Diagnosis not present

## 2021-01-19 IMAGING — DX DG FEMUR 2+V PORT*R*
4 series · 4 of 4 positions shown · non-contrast
Comparison: June 25, 2020 and intraoperative images June 26, 2020

CLINICAL DATA: Intramedullary nail placement

EXAM:
RIGHT FEMUR PORTABLE 2 VIEW

[femur ap (1 of 2)]
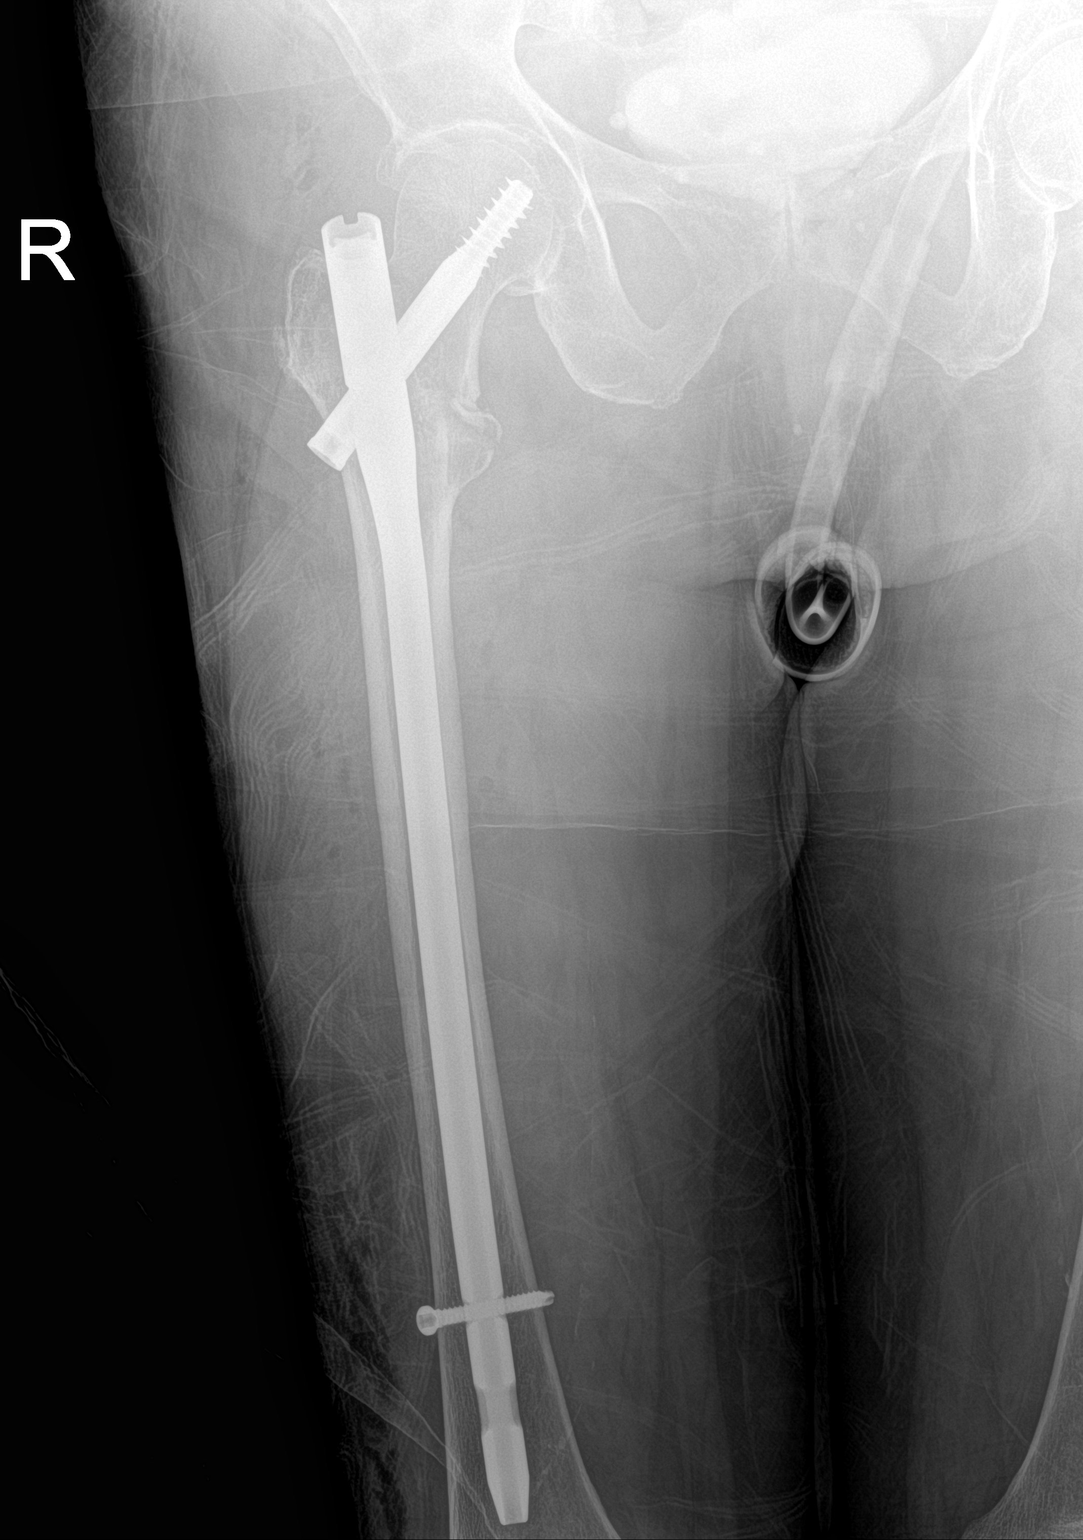

[femur ap (2 of 2)]
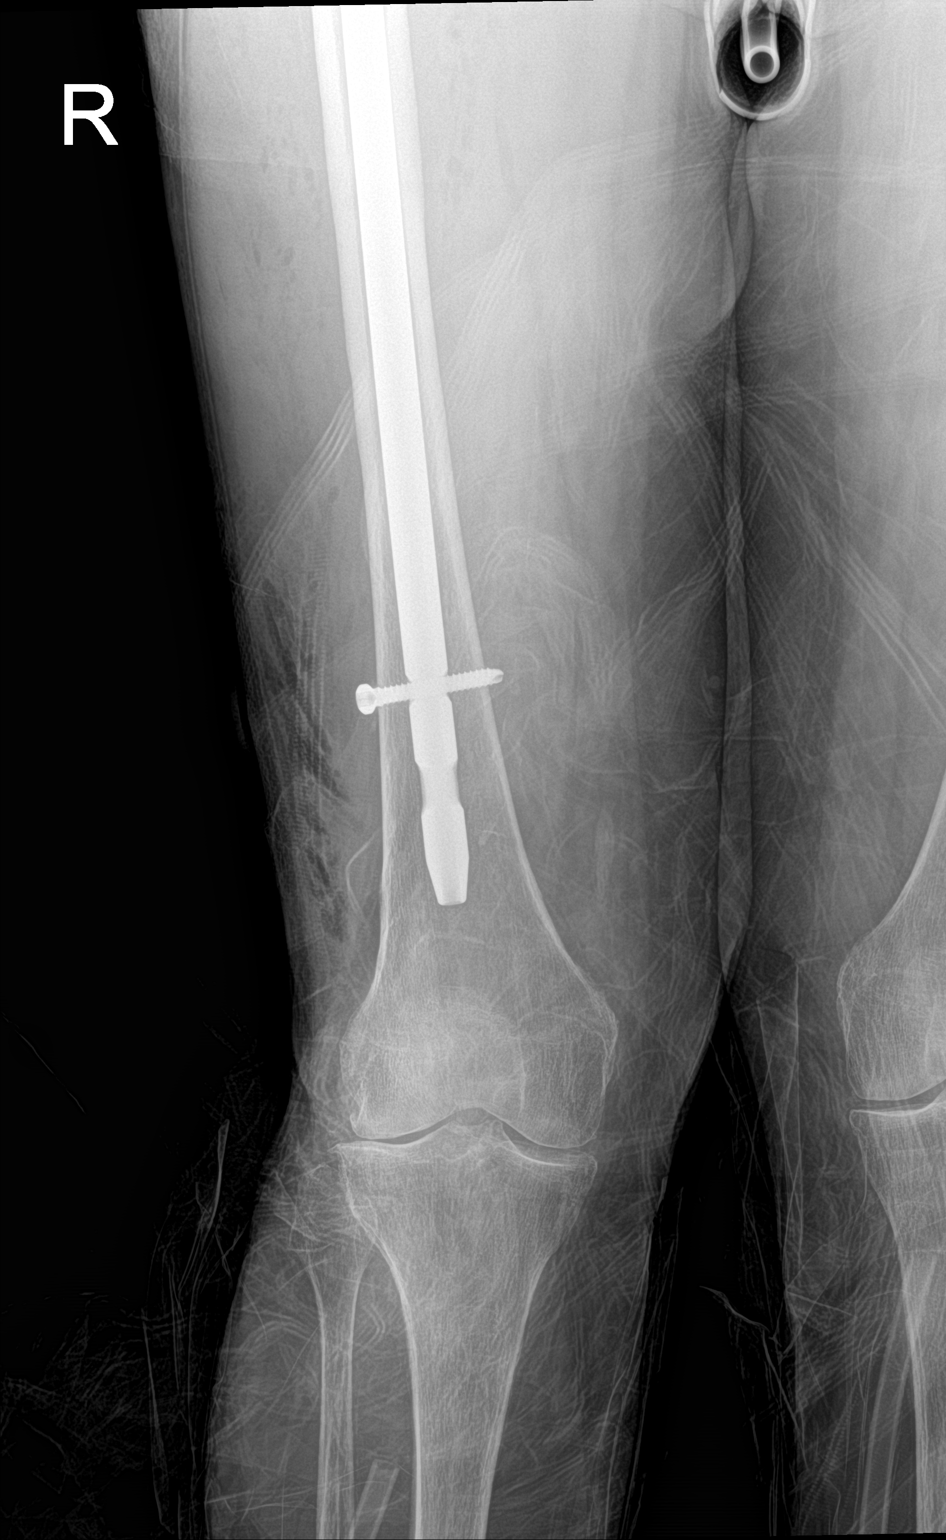

[femur lat (1 of 2)]
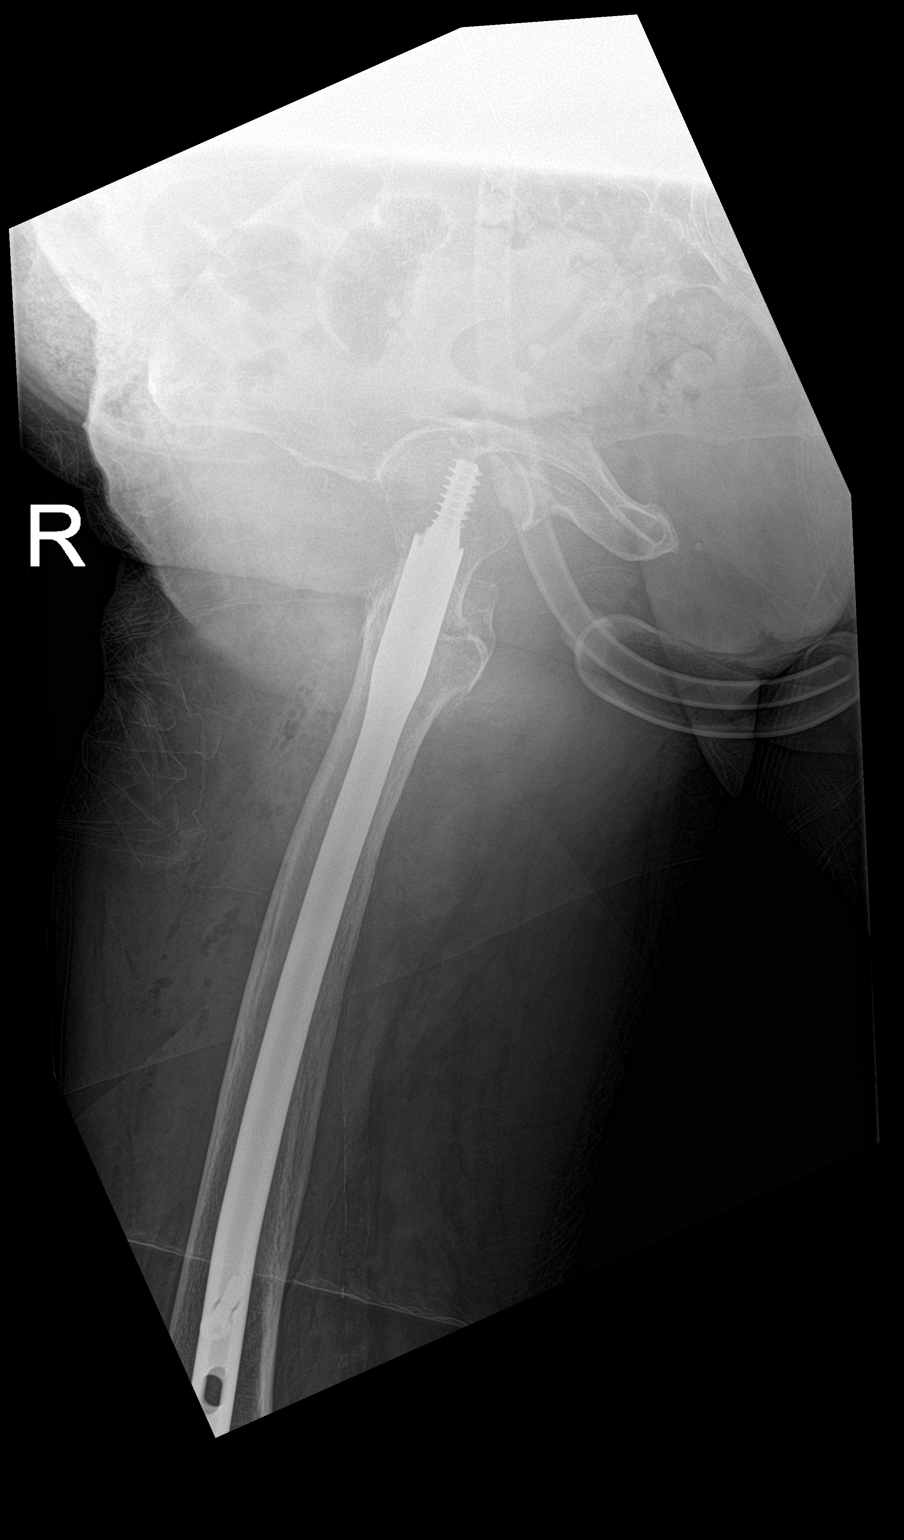

[femur lat (2 of 2)]
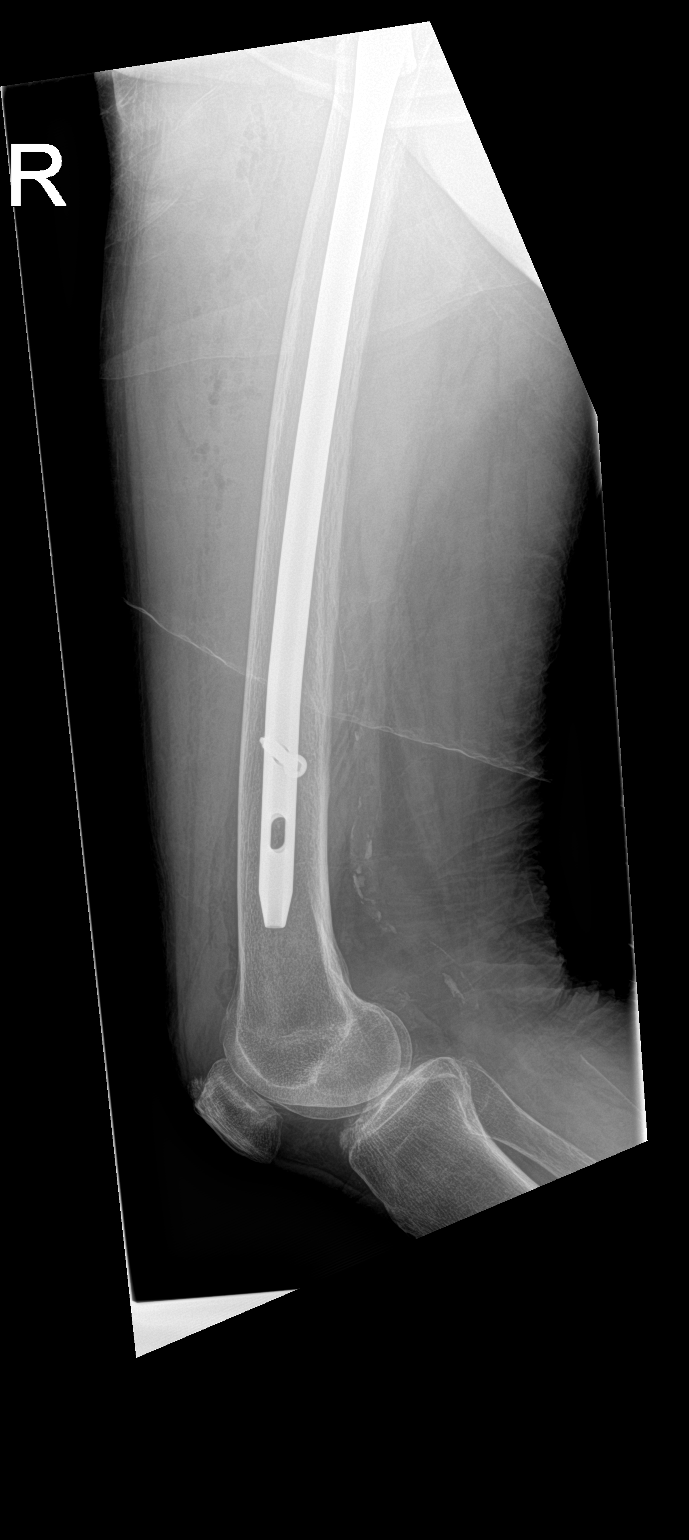

[4 of 4 positions shown; findings below may reference images not displayed]

FINDINGS: Frontal and lateral views were obtained. There is screw and rod
fixation through an intertrochanteric femur fracture on the right.
Alignment at the fracture site is near anatomic. Screw tip in
proximal femoral head. No new fracture. No dislocation. No
appreciable knee joint effusion. Osteoarthritic change noted in the
knee joint region.
IMPRESSION: Screw and nail fixation through intertrochanteric femur fracture. No
new fracture. No dislocation. Arthropathy in the right knee joint
region noted.

## 2021-03-20 ENCOUNTER — Telehealth: Payer: Self-pay

## 2021-03-20 NOTE — Telephone Encounter (Signed)
Patient son [name] [facility]) left a voicemail.  Patient has moved to [place] to be with him and other family members.  Her new PCP wanted to know the last date of CPE. In review of all her visits 2021-2022, patient did not have annual physical.    I called [name] back to let him know of my findings.  He said he thought that Unity had not had a physical in a couple of years but just wanted to make sure so that Medicare will cover upcoming CPE appt with her new PCP.  He thanked me for calling back.
